# Patient Record
Sex: Female | Born: 1950 | Race: White | Hispanic: No | Marital: Married | Smoking: Former smoker
Health system: Southern US, Community
[De-identification: ages and names within clinical notes are randomized; demographics above are authoritative.]

## PROBLEM LIST (undated history)

## (undated) DIAGNOSIS — M549 Dorsalgia, unspecified: Secondary | ICD-10-CM

## (undated) DIAGNOSIS — M23351 Other meniscus derangements, posterior horn of lateral meniscus, right knee: Secondary | ICD-10-CM

## (undated) DIAGNOSIS — F32A Depression, unspecified: Secondary | ICD-10-CM

## (undated) DIAGNOSIS — J449 Chronic obstructive pulmonary disease, unspecified: Secondary | ICD-10-CM

## (undated) DIAGNOSIS — E119 Type 2 diabetes mellitus without complications: Secondary | ICD-10-CM

## (undated) DIAGNOSIS — J189 Pneumonia, unspecified organism: Secondary | ICD-10-CM

## (undated) DIAGNOSIS — IMO0002 Reserved for concepts with insufficient information to code with codable children: Secondary | ICD-10-CM

## (undated) DIAGNOSIS — Z972 Presence of dental prosthetic device (complete) (partial): Secondary | ICD-10-CM

## (undated) DIAGNOSIS — R569 Unspecified convulsions: Secondary | ICD-10-CM

## (undated) DIAGNOSIS — R112 Nausea with vomiting, unspecified: Secondary | ICD-10-CM

## (undated) DIAGNOSIS — IMO0001 Reserved for inherently not codable concepts without codable children: Secondary | ICD-10-CM

## (undated) DIAGNOSIS — H9190 Unspecified hearing loss, unspecified ear: Secondary | ICD-10-CM

## (undated) DIAGNOSIS — Z9889 Other specified postprocedural states: Secondary | ICD-10-CM

## (undated) DIAGNOSIS — C801 Malignant (primary) neoplasm, unspecified: Secondary | ICD-10-CM

## (undated) DIAGNOSIS — G4730 Sleep apnea, unspecified: Secondary | ICD-10-CM

## (undated) DIAGNOSIS — K222 Esophageal obstruction: Secondary | ICD-10-CM

## (undated) DIAGNOSIS — F329 Major depressive disorder, single episode, unspecified: Secondary | ICD-10-CM

## (undated) DIAGNOSIS — D649 Anemia, unspecified: Secondary | ICD-10-CM

## (undated) DIAGNOSIS — M797 Fibromyalgia: Secondary | ICD-10-CM

## (undated) DIAGNOSIS — K859 Acute pancreatitis without necrosis or infection, unspecified: Secondary | ICD-10-CM

## (undated) DIAGNOSIS — K219 Gastro-esophageal reflux disease without esophagitis: Secondary | ICD-10-CM

## (undated) DIAGNOSIS — M1990 Unspecified osteoarthritis, unspecified site: Secondary | ICD-10-CM

## (undated) DIAGNOSIS — I209 Angina pectoris, unspecified: Secondary | ICD-10-CM

## (undated) DIAGNOSIS — K449 Diaphragmatic hernia without obstruction or gangrene: Secondary | ICD-10-CM

## (undated) DIAGNOSIS — R519 Headache, unspecified: Secondary | ICD-10-CM

## (undated) DIAGNOSIS — J45909 Unspecified asthma, uncomplicated: Secondary | ICD-10-CM

## (undated) DIAGNOSIS — M1711 Unilateral primary osteoarthritis, right knee: Secondary | ICD-10-CM

## (undated) DIAGNOSIS — G8929 Other chronic pain: Secondary | ICD-10-CM

## (undated) DIAGNOSIS — R51 Headache: Secondary | ICD-10-CM

## (undated) DIAGNOSIS — I4891 Unspecified atrial fibrillation: Secondary | ICD-10-CM

## (undated) DIAGNOSIS — K5792 Diverticulitis of intestine, part unspecified, without perforation or abscess without bleeding: Secondary | ICD-10-CM

## (undated) DIAGNOSIS — I8290 Acute embolism and thrombosis of unspecified vein: Secondary | ICD-10-CM

## (undated) DIAGNOSIS — I493 Ventricular premature depolarization: Secondary | ICD-10-CM

## (undated) HISTORY — PX: HERNIA REPAIR: SHX51

## (undated) HISTORY — DX: Gastro-esophageal reflux disease without esophagitis: K21.9

## (undated) HISTORY — DX: Diverticulitis of intestine, part unspecified, without perforation or abscess without bleeding: K57.92

## (undated) HISTORY — PX: DILATION AND CURETTAGE OF UTERUS: SHX78

## (undated) HISTORY — DX: Dorsalgia, unspecified: M54.9

## (undated) HISTORY — DX: Reserved for concepts with insufficient information to code with codable children: IMO0002

## (undated) HISTORY — PX: CHOLECYSTECTOMY: SHX55

## (undated) HISTORY — DX: Acute pancreatitis without necrosis or infection, unspecified: K85.90

## (undated) HISTORY — DX: Other chronic pain: G89.29

## (undated) HISTORY — PX: OTHER SURGICAL HISTORY: SHX169

## (undated) HISTORY — DX: Unspecified atrial fibrillation: I48.91

## (undated) HISTORY — DX: Depression, unspecified: F32.A

## (undated) HISTORY — DX: Sleep apnea, unspecified: G47.30

## (undated) HISTORY — DX: Chronic obstructive pulmonary disease, unspecified: J44.9

## (undated) HISTORY — DX: Esophageal obstruction: K22.2

## (undated) HISTORY — DX: Unspecified osteoarthritis, unspecified site: M19.90

## (undated) HISTORY — DX: Diaphragmatic hernia without obstruction or gangrene: K44.9

## (undated) HISTORY — PX: TONSILLECTOMY: SUR1361

## (undated) HISTORY — PX: CERVICAL CONE BIOPSY: SUR198

## (undated) HISTORY — PX: JOINT REPLACEMENT: SHX530

## (undated) HISTORY — PX: FOOT ARTHRODESIS: SHX1655

## (undated) HISTORY — PX: SHOULDER OPEN ROTATOR CUFF REPAIR: SHX2407

## (undated) HISTORY — DX: Major depressive disorder, single episode, unspecified: F32.9

---

## 2005-05-14 ENCOUNTER — Ambulatory Visit: Payer: Self-pay | Admitting: Internal Medicine

## 2005-06-19 ENCOUNTER — Ambulatory Visit: Payer: Self-pay | Admitting: Internal Medicine

## 2005-06-23 ENCOUNTER — Ambulatory Visit (HOSPITAL_COMMUNITY): Admission: RE | Admit: 2005-06-23 | Discharge: 2005-06-23 | Payer: Self-pay | Admitting: Internal Medicine

## 2005-07-02 ENCOUNTER — Ambulatory Visit (HOSPITAL_COMMUNITY): Admission: RE | Admit: 2005-07-02 | Discharge: 2005-07-02 | Payer: Self-pay | Admitting: Internal Medicine

## 2005-07-02 ENCOUNTER — Ambulatory Visit: Payer: Self-pay | Admitting: Internal Medicine

## 2005-11-20 ENCOUNTER — Ambulatory Visit: Payer: Self-pay | Admitting: Internal Medicine

## 2005-12-03 ENCOUNTER — Encounter: Payer: Self-pay | Admitting: Internal Medicine

## 2005-12-03 ENCOUNTER — Ambulatory Visit: Payer: Self-pay | Admitting: Internal Medicine

## 2005-12-03 ENCOUNTER — Ambulatory Visit (HOSPITAL_COMMUNITY): Admission: RE | Admit: 2005-12-03 | Discharge: 2005-12-03 | Payer: Self-pay | Admitting: Internal Medicine

## 2005-12-03 HISTORY — PX: COLONOSCOPY: SHX174

## 2005-12-06 ENCOUNTER — Inpatient Hospital Stay (HOSPITAL_COMMUNITY): Admission: EM | Admit: 2005-12-06 | Discharge: 2005-12-09 | Payer: Self-pay | Admitting: Emergency Medicine

## 2005-12-08 ENCOUNTER — Ambulatory Visit: Payer: Self-pay | Admitting: Cardiology

## 2005-12-13 ENCOUNTER — Inpatient Hospital Stay (HOSPITAL_COMMUNITY): Admission: EM | Admit: 2005-12-13 | Discharge: 2005-12-19 | Payer: Self-pay | Admitting: Emergency Medicine

## 2005-12-15 ENCOUNTER — Ambulatory Visit: Payer: Self-pay | Admitting: Internal Medicine

## 2005-12-31 ENCOUNTER — Ambulatory Visit: Payer: Self-pay | Admitting: Internal Medicine

## 2006-01-02 ENCOUNTER — Ambulatory Visit (HOSPITAL_COMMUNITY): Admission: RE | Admit: 2006-01-02 | Discharge: 2006-01-02 | Payer: Self-pay | Admitting: Internal Medicine

## 2006-01-05 ENCOUNTER — Ambulatory Visit: Payer: Self-pay | Admitting: Cardiology

## 2006-01-07 ENCOUNTER — Ambulatory Visit: Payer: Self-pay | Admitting: Cardiology

## 2006-01-07 ENCOUNTER — Encounter (HOSPITAL_COMMUNITY): Admission: RE | Admit: 2006-01-07 | Discharge: 2006-02-06 | Payer: Self-pay | Admitting: Cardiology

## 2006-01-12 ENCOUNTER — Ambulatory Visit: Payer: Self-pay | Admitting: Cardiology

## 2006-01-29 ENCOUNTER — Ambulatory Visit: Payer: Self-pay | Admitting: Internal Medicine

## 2006-03-19 ENCOUNTER — Ambulatory Visit: Payer: Self-pay | Admitting: Internal Medicine

## 2006-05-04 ENCOUNTER — Ambulatory Visit: Payer: Self-pay | Admitting: Internal Medicine

## 2006-05-18 ENCOUNTER — Ambulatory Visit: Payer: Self-pay | Admitting: Internal Medicine

## 2006-06-25 ENCOUNTER — Ambulatory Visit: Payer: Self-pay | Admitting: Internal Medicine

## 2006-08-10 ENCOUNTER — Ambulatory Visit: Payer: Self-pay | Admitting: Internal Medicine

## 2006-08-21 ENCOUNTER — Ambulatory Visit (HOSPITAL_COMMUNITY): Admission: RE | Admit: 2006-08-21 | Discharge: 2006-08-21 | Payer: Self-pay | Admitting: Internal Medicine

## 2006-09-04 ENCOUNTER — Ambulatory Visit: Payer: Self-pay | Admitting: Internal Medicine

## 2006-09-18 ENCOUNTER — Ambulatory Visit (HOSPITAL_COMMUNITY): Admission: RE | Admit: 2006-09-18 | Discharge: 2006-09-18 | Payer: Self-pay | Admitting: Internal Medicine

## 2006-09-25 ENCOUNTER — Ambulatory Visit: Payer: Self-pay | Admitting: Internal Medicine

## 2006-10-12 ENCOUNTER — Ambulatory Visit: Payer: Self-pay | Admitting: Internal Medicine

## 2006-11-17 ENCOUNTER — Ambulatory Visit: Payer: Self-pay | Admitting: Internal Medicine

## 2006-12-15 ENCOUNTER — Ambulatory Visit: Payer: Self-pay | Admitting: Internal Medicine

## 2007-03-08 ENCOUNTER — Ambulatory Visit: Payer: Self-pay | Admitting: Internal Medicine

## 2007-04-07 ENCOUNTER — Ambulatory Visit: Payer: Self-pay | Admitting: Internal Medicine

## 2007-04-22 ENCOUNTER — Ambulatory Visit: Payer: Self-pay | Admitting: Internal Medicine

## 2007-04-26 ENCOUNTER — Ambulatory Visit (HOSPITAL_COMMUNITY): Admission: RE | Admit: 2007-04-26 | Discharge: 2007-04-26 | Payer: Self-pay | Admitting: General Surgery

## 2007-06-28 ENCOUNTER — Ambulatory Visit: Payer: Self-pay | Admitting: Internal Medicine

## 2007-07-01 ENCOUNTER — Ambulatory Visit (HOSPITAL_COMMUNITY): Admission: RE | Admit: 2007-07-01 | Discharge: 2007-07-01 | Payer: Self-pay | Admitting: Gastroenterology

## 2007-07-01 ENCOUNTER — Ambulatory Visit: Payer: Self-pay | Admitting: Gastroenterology

## 2007-07-12 ENCOUNTER — Ambulatory Visit (HOSPITAL_COMMUNITY): Admission: RE | Admit: 2007-07-12 | Discharge: 2007-07-12 | Payer: Self-pay | Admitting: Internal Medicine

## 2007-07-12 ENCOUNTER — Ambulatory Visit: Payer: Self-pay | Admitting: Internal Medicine

## 2007-07-12 HISTORY — PX: ESOPHAGOGASTRODUODENOSCOPY: SHX1529

## 2007-09-01 ENCOUNTER — Ambulatory Visit: Payer: Self-pay | Admitting: Internal Medicine

## 2007-12-08 ENCOUNTER — Ambulatory Visit: Payer: Self-pay | Admitting: Internal Medicine

## 2008-05-05 ENCOUNTER — Ambulatory Visit: Payer: Self-pay | Admitting: Internal Medicine

## 2008-05-12 ENCOUNTER — Encounter (HOSPITAL_COMMUNITY): Admission: RE | Admit: 2008-05-12 | Discharge: 2008-06-11 | Payer: Self-pay | Admitting: Internal Medicine

## 2008-11-24 ENCOUNTER — Emergency Department (HOSPITAL_COMMUNITY): Admission: EM | Admit: 2008-11-24 | Discharge: 2008-11-25 | Payer: Self-pay | Admitting: Emergency Medicine

## 2008-11-25 ENCOUNTER — Ambulatory Visit: Payer: Self-pay | Admitting: Internal Medicine

## 2008-11-30 ENCOUNTER — Ambulatory Visit (HOSPITAL_COMMUNITY): Admission: RE | Admit: 2008-11-30 | Discharge: 2008-11-30 | Payer: Self-pay | Admitting: Internal Medicine

## 2008-11-30 ENCOUNTER — Ambulatory Visit: Payer: Self-pay | Admitting: Internal Medicine

## 2008-11-30 HISTORY — PX: ESOPHAGOGASTRODUODENOSCOPY: SHX1529

## 2008-12-25 ENCOUNTER — Encounter: Payer: Self-pay | Admitting: Gastroenterology

## 2009-06-13 ENCOUNTER — Ambulatory Visit: Payer: Self-pay | Admitting: Internal Medicine

## 2009-06-14 DIAGNOSIS — K219 Gastro-esophageal reflux disease without esophagitis: Secondary | ICD-10-CM | POA: Insufficient documentation

## 2009-06-14 DIAGNOSIS — K5190 Ulcerative colitis, unspecified, without complications: Secondary | ICD-10-CM | POA: Insufficient documentation

## 2009-08-01 ENCOUNTER — Ambulatory Visit: Payer: Self-pay | Admitting: Internal Medicine

## 2009-08-01 DIAGNOSIS — K5900 Constipation, unspecified: Secondary | ICD-10-CM | POA: Insufficient documentation

## 2009-08-01 DIAGNOSIS — R1032 Left lower quadrant pain: Secondary | ICD-10-CM | POA: Insufficient documentation

## 2009-10-02 ENCOUNTER — Ambulatory Visit: Payer: Self-pay | Admitting: Internal Medicine

## 2009-10-02 ENCOUNTER — Encounter (INDEPENDENT_AMBULATORY_CARE_PROVIDER_SITE_OTHER): Payer: Self-pay

## 2009-10-03 LAB — CONVERTED CEMR LAB
Basophils Absolute: 0.1 10*3/uL (ref 0.0–0.1)
Glucose, Bld: 100 mg/dL — ABNORMAL HIGH (ref 70–99)
Hemoglobin: 13.2 g/dL (ref 12.0–15.0)
Lymphocytes Relative: 35 % (ref 12–46)
Monocytes Absolute: 0.7 10*3/uL (ref 0.1–1.0)
Neutro Abs: 4.8 10*3/uL (ref 1.7–7.7)
Potassium: 4.5 meq/L (ref 3.5–5.3)
RDW: 14.5 % (ref 11.5–15.5)
Sodium: 138 meq/L (ref 135–145)

## 2010-04-26 ENCOUNTER — Encounter (INDEPENDENT_AMBULATORY_CARE_PROVIDER_SITE_OTHER): Payer: Self-pay

## 2010-06-13 ENCOUNTER — Ambulatory Visit: Payer: Self-pay | Admitting: Internal Medicine

## 2010-07-01 ENCOUNTER — Encounter: Payer: Self-pay | Admitting: Urgent Care

## 2010-10-29 NOTE — Letter (Signed)
Summary: Out of Work Note  [facility]  [address]   [place], [place] [address]   Phone: [phone]  Fax: [phone]    10/02/2009  TO: WHOM IT MAY CONCERN  RE: [name] [place] [id number] Jul 08, 1951       The above named individual was seen in our office today.     If you have any further questions or need additional information, please call.     Sincerely,     [facility] R. Garfield Cornea, M.D.    Caro Hight, M.D. Vickey Huger, FNP-BC    Neil Crouch, PA-C Phone: [phone]    Fax: [phone]

## 2010-10-29 NOTE — Letter (Signed)
Summary: Recall Office Visit  [facility]  [address]   [place], [place] [address]   Phone: [phone]  Fax: [phone]      April 26, 2010   [name] [address] [place]  [address] 1951-06-14   Dear Ms. Krus,   According to our records, it is time for you to schedule a follow-up office visit with [place].   At your convenience, please call [phone] to schedule an office visit. If you have any questions, concerns, or feel that this letter is in error, we would appreciate your call.   Sincerely,    Burnadette Peter LPN  [facility] Ph: [phone]   Fax: [phone]

## 2010-10-29 NOTE — Assessment & Plan Note (Signed)
Summary: 6wk follow up- cdg   Visit Type:  Follow-up Visit Primary Care Provider:  [name], PA  Chief Complaint:  F/u UC/Gerd.  History of Present Illness: 60 year old ulcerative colitis. Symptoms well controlled now on colazoll. She has had no  diarrhea,  rectal bleeding; She has some constipation.  She takes a dose of GlycoLax once weekly on average.   She may go 4 days before having a bowel movement; waits a couple days prior taking anything for constipation. She hasn't had any recent blood work. Reflux symptoms are well-controlled. She has'nt had  any further dysphagia since she had her esophagus dilated.  Current Medications (verified): 1)  Wellbutrin Xl 150 Mg Xr24h-Tab (Bupropion Hcl) .... Take 1 Tablet By Mouth Once A Day 2)  Zyrtec Allergy 10 Mg Tabs (Cetirizine Hcl) .... Take 1 Tablet By Mouth Once A Day 3)  Prevacid 30 Mg Cpdr (Lansoprazole) .... Take 1 Tablet By Mouth Once A Day 4)  Trazodone Hcl 50 Mg Tabs (Trazodone Hcl) .... Take 2 Tablets At Bedtime 5)  Asa 81 Mg .... Take 1 Tablet By Mouth Once A Day 6)  Metformin Hcl 500 Mg Tabs (Metformin Hcl) .... Take 2 Tablets Pm 7)  Ferrous Gluconate 225 (27 Fe) Mg Tabs (Ferrous Gluconate) .... Take 1 Tablet By Mouth Once A Day 8)  Balsalazide Disodium 750 Mg Caps (Balsalazide Disodium) .... 3 By Mouth Tid 9)  Astelin 137 Mcg/spray Soln (Azelastine Hcl) .... 2 Sprays Each Nostril Prn 10)  Citrucel 500 Mg Tabs (Methylcellulose (Laxative)) .... Take 1 Tablet By Mouth Two Times A Day 11)  Calcium Plus D .... Take 1 Tablet By Mouth Two Times A Day 12)  Tramadol Hcl 50 Mg Tabs (Tramadol Hcl) .... One Tablet Three Times A Day As Needed 13)  Albuterol Inhaler .... As Needed 14)  Advair Diskus .... Twice Daily 15)  Cymbalta 60 Mg Cpep (Duloxetine Hcl) .... Take 1 Tablet By Mouth Once A Day 16)  Zaditor .... As Directed For Eyes 17)  Glycolax  Powd (Polyethylene Glycol 3350) .Marland KitchenMarland KitchenMarland Kitchen 17 Grams By Mouth As Needed For Constipation 18)  Septra  .... Take 1 Tablet By Mouth Two Times A Day  Allergies (verified): 1)  ! Penicillin 2)  ! Ceftin 3)  ! Codeine 4)  ! Asa 5)  ! Biaxin 6)  ! * Asacol 7)  ! Entex 8)  ! * Cefzil 9)  ! * Albumin/eggs  Past History:  Past Medical History: Last updated: 06/21/2009 Seasonal Allergies Depression Ulcerative Colitis Reflux Schatzki's Ring Asacol Induced pancreatitis COPD Diverticulitis Hiatal Hernia Vertigo Mild Sleep Apnea  Past Surgical History: Last updated: June 21, 2009 Tonsilectomy D&C Hernia Cholecystectomy Carcinoma in situ of cervix (D&C colinazation  1980) Rebuild arch and foot  2007  Family History: Last updated: June 21, 2009 Father: Deceased age 22  Complications from surgery for bladder Mother: Deceased age 55   Pneumonia ? complications Siblings:2 brothers and 2 sisters   all alive and healthy  Social History: Last updated: 2009/06/21 Marital Status: Married Children: three Occupation: Astronomer Aide Former Smoker  Quit x 7 years No alcohol for many years No exercise routine, walks alot at school Patient is a former smoker.  Alcohol Use - no  Risk Factors: Smoking Status: quit (06/21/2009)  Vital Signs:  Patient profile:   60 year old female Height:      64 inches Weight:      176 pounds BMI:     30.32 Temp:     98.9 degrees  F oral Pulse rate:   96 / minute BP sitting:   140 / 78  (left arm) Cuff size:   regular  Vitals Entered By: Waldon Merl LPN (October 03, 4763 3:48 PM)  Physical Exam  General:  pleasant alert, conversant and in no acute distress Eyes:  no scleral icterus are pink Lungs:  clear to auscultation Heart:  regular rate and rhythm without murmur gallop rub Abdomen:  nondistended positive bowel sounds soft, nontender without appreciable mass or organomegaly  Impression & Recommendations: Pleasant 60 year old lady with ulcerative colitis in remission. She's had some problems with constipation more recently. Reflux symptoms  well-controlled. No further dysphagia since she had her esophagus dilated.  Needs repeat blood work.  Recommendations: BMET and  CBC today.  Continue colaszoll. Use Gycolax 17 g orally at bedtime if no bowel movement on any given day. This should facilitate bowel function. Unless something comes up, plan to see this nice lady back in 6 months.  Other Orders: T-Basic Metabolic Panel ([id number]) T-CBC w/Diff [phone])       Appended Document: Orders Update-charge    Clinical Lists Changes  Orders: Added new Service order of Est. Patient Level III (74944) - Signed

## 2010-10-29 NOTE — Assessment & Plan Note (Signed)
Summary: one yr fu/gerd,ulcerative colitis,constipation/ss   Visit Type:  Follow-up Visit Primary Care Dashley Monts:  [name], PA  Chief Complaint:  F/U gerd/uc/colitis/constipation.  History of Present Illness: History of ulcerative colitis. Taking Colazal 3 tablets b.i.d.; had her left ankle now operated on. Her right ankle is doing well following surgery. She is has been somewhat constipated -  takes lactulose p.r.n. No tenesmus, abdominal pain or rectal bleeding. GERD symptoms well-controlled.    Current Medications (verified): 1)  Wellbutrin Xl 150 Mg Xr24h-Tab (Bupropion Hcl) .... Take 1 Tablet By Mouth Once A Day 2)  Zyrtec Allergy 10 Mg Tabs (Cetirizine Hcl) .... Take 1 Tablet By Mouth Once A Day 3)  Prevacid 30 Mg Cpdr (Lansoprazole) .... Take 1 Tablet By Mouth Once A Day 4)  Trazodone Hcl 50 Mg Tabs (Trazodone Hcl) .... Take 2 Tablets At Bedtime 5)  Asa 81 Mg .... Take 1 Tablet By Mouth Once A Day 6)  Metformin Hcl 500 Mg Tabs (Metformin Hcl) .... Take 2 Tablets Pm 7)  Ferrous Gluconate 225 (27 Fe) Mg Tabs (Ferrous Gluconate) .... Take 1 Tablet By Mouth Once A Day 8)  Balsalazide Disodium 750 Mg Caps (Balsalazide Disodium) .... 3 By Mouth Tid 9)  Astelin 137 Mcg/spray Soln (Azelastine Hcl) .... 2 Sprays Each Nostril Prn 10)  Citrucel 500 Mg Tabs (Methylcellulose (Laxative)) .... Take 1 Tablet By Mouth Two Times A Day 11)  Calcium Plus D .... Take 1 Tablet By Mouth Two Times A Day 12)  Tramadol Hcl 50 Mg Tabs (Tramadol Hcl) .... One Tablet Three Times A Day As Needed 13)  Albuterol Inhaler .... As Needed 14)  Advair Diskus .... Twice Daily 15)  Cymbalta 60 Mg Cpep (Duloxetine Hcl) .... Take 1 Tablet By Mouth Once A Day 16)  Zaditor .... As Directed For Eyes 17)  Glycolax  Powd (Polyethylene Glycol 3350) .Marland KitchenMarland KitchenMarland Kitchen 17 Grams By Mouth As Needed For Constipation 18)  Endocet 10-325 Mg Tabs (Oxycodone-Acetaminophen) .... As Needed ( Had Foot Surgery)  Allergies (verified): 1)  !  Penicillin 2)  ! Ceftin 3)  ! Codeine 4)  ! Asa 5)  ! Biaxin 6)  ! * Asacol 7)  ! Entex 8)  ! * Cefzil 9)  ! * Albumin/eggs  Past History:  Past Medical History: Last updated: Jul 02, 2009 Seasonal Allergies Depression Ulcerative Colitis Reflux Schatzki's Ring Asacol Induced pancreatitis COPD Diverticulitis Hiatal Hernia Vertigo Mild Sleep Apnea  Family History: Last updated: 07-02-2009 Father: Deceased age 25  Complications from surgery for bladder Mother: Deceased age 60   Pneumonia ? complications Siblings:2 brothers and 2 sisters   all alive and healthy  Social History: Last updated: 07/02/09 Marital Status: Married Children: three Occupation: Astronomer Aide Former Smoker  Quit x 7 years No alcohol for many years No exercise routine, walks alot at school Patient is a former smoker.  Alcohol Use - no  Risk Factors: Smoking Status: quit (07/02/2009)  Past Surgical History: Tonsilectomy D&C Hernia Cholecystectomy Carcinoma in situ of cervix (D&C colinazation  1980) Rebuild arch and foot  2007 Left foot surgery 02/2010  Physical Exam  Abdomen:  confined to a wheelchair. Appears comfortable abdomen is soft and nontender without obvious mass or organomegaly  Impression & Recommendations: Impression: History of ulcerative  colitis - remains in remission on colazall-low dose. GERD symptoms well-controlled. She remains in remission.  Constipation well-controlled on lactulose p.r.n.  Recommendations continue current regimen plan to see her back in one year. She states in 3-6 months  she should have lab work including electrolytes done by PCP office. I've asked her to get copies forwarded to me.  Appended Document: Orders Update    Clinical Lists Changes  Orders: Added new Service order of Est. Patient Level III (81275) - Signed

## 2010-10-29 NOTE — Medication Information (Signed)
Summary: Visual merchandiser   Imported By: Zeb Comfort 07/01/2010 08:20:39  _____________________________________________________________________  External Attachment:    Type:   Image     Comment:   External Document  Appended Document: RX Folder    Prescriptions: BALSALAZIDE DISODIUM 750 MG CAPS (BALSALAZIDE DISODIUM) 3 by mouth tid  #270 x 5   Entered and Authorized by:   Andria Meuse FNP-BC   Signed by:   Andria Meuse FNP-BC on 07/01/2010   Method used:   Electronically to        [id number] (retail)       [address]       [place], [place]  [address]       Ph: [phone] or [phone]       Fax: [phone]   RxID:   [id number]

## 2011-01-09 ENCOUNTER — Ambulatory Visit (INDEPENDENT_AMBULATORY_CARE_PROVIDER_SITE_OTHER): Payer: BC Managed Care – PPO | Admitting: Gastroenterology

## 2011-01-09 ENCOUNTER — Encounter: Payer: Self-pay | Admitting: Gastroenterology

## 2011-01-09 DIAGNOSIS — K5190 Ulcerative colitis, unspecified, without complications: Secondary | ICD-10-CM | POA: Insufficient documentation

## 2011-01-09 DIAGNOSIS — R1310 Dysphagia, unspecified: Secondary | ICD-10-CM | POA: Insufficient documentation

## 2011-01-09 LAB — GLUCOSE, CAPILLARY: Glucose-Capillary: 101 mg/dL — ABNORMAL HIGH (ref 70–99)

## 2011-01-09 NOTE — Assessment & Plan Note (Signed)
UC in remission, doing well on colazal. No changes required at this time.

## 2011-01-09 NOTE — Assessment & Plan Note (Signed)
60 year old pleasant Caucasian female with a hx of dysphagia secondary to Schatzki's ring, s/p multiple endoscopic dilations in past. Most recently in 2010. Pt with 1 week hx of worsening dysphagia, with likely culprit of Schatzki's ring. No reflux. No odynophagia. Will proceed with EGD/ED.   EGD with ED to be performed in near future with Dr. Gala Romney: the R/B/A have been discussed in detail with understanding and agreement by patient.   Continue PPI

## 2011-01-09 NOTE — Patient Instructions (Signed)
Follow soft diet, chew well.  We have set you up for an endoscopy with Dr. Gala Romney.  Continue Prevacid.

## 2011-01-09 NOTE — Progress Notes (Signed)
Referring Provider: None Primary Care Physician:  Gar Ponto, MD Gastroenterologist: Dr. Gala Romney  CC: Dysphagia   HPI:   [name] is a 60 year old pleasant female with a hx of UC, symptoms well-controlled on colazal. However, she also has a hx of multiple dilations in the past secondary to Schatzki's ring; in fact, she had a food impaction in the past we well, requiring endoscopic intervention. Her last EGD was in 2010. She currently denies reflux. She has had dysphagia X 1 week with food and pills. Denies odynophagia. Occasional epigastric discomfort with dysphagia. No loss of appetite; no wt loss. No hematemesis. As for hx of UC, she is at her baseline having 2-3 BMs per day. No abdominal pain or rectal bleeding.    Past Medical History  Diagnosis Date  . Depression   . Ulcerative colitis   . GERD (gastroesophageal reflux disease)   . Pancreatitis   . COPD (chronic obstructive pulmonary disease)   . Diverticulitis   . Sleep apnea   . Schatzki's ring   . S/P endoscopy     multiple times requiring dilation. Most recent in 2010    Past Surgical History  Procedure Date  . Tonsillectomy   . Dilation and curettage of uterus   . Hernia repair   . Cholecystectomy   . Cervical cone biopsy   . Arch and foot     Current Outpatient Prescriptions  Medication Sig Dispense Refill  . albuterol (PROVENTIL,VENTOLIN) 90 MCG/ACT inhaler Inhale 2 puffs into the lungs every 4 (four) hours as needed.        Marland Kitchen aspirin 81 MG tablet Take 81 mg by mouth daily.        Marland Kitchen azelastine (ASTELIN) 137 MCG/SPRAY nasal spray 2 sprays by Nasal route 2 (two) times daily. Use in each nostril as directed       . balsalazide (COLAZAL) 750 MG capsule Take 2,250 mg by mouth 3 (three) times daily.        Marland Kitchen buPROPion (WELLBUTRIN XL) 300 MG 24 hr tablet Take 300 mg by mouth daily.        . Calcium Carbonate-Vitamin D (CALCIUM + D PO) Take 1 tablet by mouth 2 (two) times daily.        . cetirizine (ZYRTEC) 10  MG chewable tablet Chew 10 mg by mouth daily.        . DULoxetine (CYMBALTA) 60 MG capsule Take 60 mg by mouth daily.        . ferrous gluconate (FERGON) 225 (27 FE) MG tablet Take 240 mg by mouth 3 (three) times daily with meals.        . Fluticasone-Salmeterol (ADVAIR DISKUS) 100-50 MCG/DOSE AEPB Inhale 1 puff into the lungs every 12 (twelve) hours.        Marland Kitchen ketotifen (ZADITOR) 0.025 % ophthalmic solution Place 2 drops into both eyes 2 (two) times daily.        . lansoprazole (PREVACID) 30 MG capsule Take 30 mg by mouth daily.        . metFORMIN (GLUMETZA) 500 MG (MOD) 24 hr tablet Take 1,000 mg by mouth daily with breakfast.        . methylcellulose packet Take 1 each by mouth 2 (two) times daily.        . traMADol (ULTRAM-ER) 100 MG 24 hr tablet Take 100 mg by mouth 3 (three) times daily.        . traZODone (DESYREL) 100 MG tablet Take 100 mg by mouth at bedtime.  Allergies as of 01/09/2011 - Review Complete 01/09/2011  Allergen Reaction Noted  . Aspirin    . Cefprozil    . Cefuroxime axetil  08/08/2008  . Clarithromycin  08/08/2008  . Codeine    . Entex  08/08/2008  . Mesalamine    . Penicillins  08/08/2008    Family History  Problem Relation Age of Onset  . Colon cancer Neg Hx     History   Social History  . Marital Status: Married    Spouse Name: N/A    Number of Children: 3  . Years of Education: N/A   Occupational History  . teacher's aide    Social History Main Topics  . Smoking status: Former Smoker -- 1.0 packs/day for 30 years    Types: Cigarettes    Quit date: 09/29/2000  . Smokeless tobacco: Never Used  . Alcohol Use: No  . Drug Use: No  . Sexually Active: Yes -- Female partner(s)    Birth Control/ Protection: None     spouse   Other Topics Concern  . None   Social History Narrative  . None    Review of Systems: Gen: Denies fever, chills, anorexia. Denies fatigue, weakness, weight loss.  CV: Denies chest pain, palpitations, syncope,  peripheral edema, and claudication. Resp: Denies dyspnea at rest, cough, wheezing, coughing up blood, and pleurisy. GI: See HPI Derm: Denies rash, itching, dry skin Psych: Denies depression, anxiety, memory loss, confusion. No homicidal or suicidal ideation.  Heme: Denies bruising, bleeding, and enlarged lymph nodes.  Physical Exam: BP 127/81  Pulse 90  Temp 99 F (37.2 C)  Ht 5' 4"  (1.626 m)  Wt 172 lb 3.2 oz (78.109 kg)  BMI 29.56 kg/m2  SpO2 98% General:   Alert and oriented. No distress noted. Pleasant and cooperative.  Head:  Normocephalic and atraumatic. Eyes:  Conjuctiva clear without scleral icterus. Mouth:  Oral mucosa pink and moist. Good dentition. No lesions. Neck:  Supple, without mass or thyromegaly. Heart:  S1, S2 present without murmurs, rubs, or gallops. Regular rate and rhythm. Abdomen:  +BS, soft, non-tender and non-distended. No rebound or guarding. No HSM or masses noted. Msk:  Symmetrical without gross deformities. Normal posture. Extremities:  Without edema. Neurologic:  Alert and  oriented x4;  grossly normal neurologically. Skin:  Intact without significant lesions or rashes. Cervical Nodes:  No significant cervical adenopathy. Psych:  Alert and cooperative. Normal mood and affect.

## 2011-01-14 LAB — CBC
HCT: 38.1 % (ref 36.0–46.0)
Hemoglobin: 12.8 g/dL (ref 12.0–15.0)
MCHC: 33.6 g/dL (ref 30.0–36.0)
MCV: 96.4 fL (ref 78.0–100.0)
Platelets: 247 10*3/uL (ref 150–400)
RDW: 14.4 % (ref 11.5–15.5)

## 2011-01-14 LAB — GLUCOSE, CAPILLARY: Glucose-Capillary: 58 mg/dL — ABNORMAL LOW (ref 70–99)

## 2011-01-14 LAB — BASIC METABOLIC PANEL
GFR calc Af Amer: >60 mL/min (ref >60)
GFR calc non Af Amer: >60 mL/min (ref >60)
Potassium: 4.2 mEq/L (ref 3.5–5.1)
Sodium: 139 mEq/L (ref 135–145)

## 2011-01-14 LAB — DIFFERENTIAL
Basophils Absolute: 0 10*3/uL (ref 0.0–0.1)
Eosinophils Absolute: 0.3 10*3/uL (ref 0.0–0.7)
Eosinophils Relative: 2 % (ref 0–5)
Monocytes Absolute: 0.7 10*3/uL (ref 0.1–1.0)

## 2011-01-28 ENCOUNTER — Encounter: Payer: BC Managed Care – PPO | Admitting: Internal Medicine

## 2011-01-28 ENCOUNTER — Encounter: Payer: 59 | Admitting: Internal Medicine

## 2011-01-28 ENCOUNTER — Ambulatory Visit (HOSPITAL_COMMUNITY)
Admission: RE | Admit: 2011-01-28 | Discharge: 2011-01-28 | Disposition: A | Discharge status: Home / Self Care | Payer: BC Managed Care – PPO | Source: Ambulatory Visit | Attending: Internal Medicine | Admitting: Internal Medicine

## 2011-01-28 DIAGNOSIS — K222 Esophageal obstruction: Secondary | ICD-10-CM

## 2011-01-28 DIAGNOSIS — Z7982 Long term (current) use of aspirin: Secondary | ICD-10-CM | POA: Insufficient documentation

## 2011-01-28 DIAGNOSIS — K449 Diaphragmatic hernia without obstruction or gangrene: Secondary | ICD-10-CM | POA: Insufficient documentation

## 2011-01-28 DIAGNOSIS — J4489 Other specified chronic obstructive pulmonary disease: Secondary | ICD-10-CM | POA: Insufficient documentation

## 2011-01-28 DIAGNOSIS — E785 Hyperlipidemia, unspecified: Secondary | ICD-10-CM | POA: Insufficient documentation

## 2011-01-28 DIAGNOSIS — J449 Chronic obstructive pulmonary disease, unspecified: Secondary | ICD-10-CM | POA: Insufficient documentation

## 2011-01-28 DIAGNOSIS — R1310 Dysphagia, unspecified: Secondary | ICD-10-CM

## 2011-01-28 HISTORY — PX: ESOPHAGOGASTRODUODENOSCOPY: SHX1529

## 2011-01-30 NOTE — Op Note (Signed)
  NAME:  [name], [name]             ACCOUNT NO.:  [id number]  MEDICAL RECORD NO.:  [id number]           PATIENT TYPE:  O  LOCATION:  DAYP                          FACILITY:  [facility]  PHYSICIAN:  R. Garfield Cornea, M.D. DATE OF BIRTH:  Jun 23, 1951  DATE OF PROCEDURE:  01/28/2011 DATE OF DISCHARGE:                              OPERATIVE REPORT   INDICATIONS FOR PROCEDURE:  A 60 year old lady with history of recurrent dysphagia secondary to Schatzki ring now with recurrent reflux symptoms well-controlled on Prevacid 30 mg orally daily.  EGD with potential for esophageal dilation now being done.  Risks, benefits, limitations, alternatives and imponderables have been discussed, questions answered. Please see the documentation in the medical record.  PROCEDURE NOTE:  O2 saturation, blood pressure, pulse and respirations were monitored throughout the entire procedure.  CONSCIOUS SEDATION: 1. Versed 6 mg IV. 2. Demerol 50 mg IV in divided doses. 3. Cetacaine spray for topical pharyngeal anesthesia.  FINDINGS:  Examination of the tubular esophagus revealed a Schatzki ring.  Esophageal mucosa otherwise appeared normal.  EGD junction was easily traversed.  Stomach:  Gastric cavity was emptied and insufflated well with air.  Thorough examination of the gastric mucosa including retroflexed view of the proximal stomach esophagogastric junction demonstrated only a moderate size hiatal hernia, previously noted Schatzki ring.  Pylorus was patent, easily traversed.  Examination of the bulb and second portion revealed no abnormalities.  THERAPEUTIC/DIAGNOSTIC MANEUVERS PERFORMED:  Scope was withdrawn.  A 56- French Maloney dilators passed to full insertion with mild resistance. A look back revealed the ring had been disrupted nicely with minimal bleeding without apparent complication.  The patient tolerated the procedure well and was reactive to endoscopy.  IMPRESSION: 1. Schatzki ring, otherwise  normal esophagus, status post dilation and     disruption as described above. 2. Hiatal hernia otherwise normal stomach D1 and D2.  RECOMMENDATIONS: 1. Continue Prevacid 30 mg orally daily. 2. Followup appointment with [place] in 1 year.     Bridgette Habermann, M.D.     RMR/MEDQ  D:  01/28/2011  T:  01/28/2011  Job:  [id number]  Electronically Signed by Jannette Spanner M.D. on 01/30/2011 03:48:49 PM

## 2011-02-11 NOTE — Op Note (Signed)
[name], [name]             ACCOUNT NO.:  [id number]   MEDICAL RECORD NO.:  [id number]          PATIENT TYPE:  AMB   LOCATION:  DAY                           FACILITY:  [facility]   PHYSICIAN:  Jamesetta So, M.D.  DATE OF BIRTH:  [birth year]   DATE OF PROCEDURE:  04/26/2007  DATE OF DISCHARGE:                               OPERATIVE REPORT   PREOPERATIVE DIAGNOSIS:  Incisional hernia.   POSTOPERATIVE DIAGNOSIS:  Incisional hernia.   PROCEDURE:  Incisional herniorrhaphy with mesh.   SURGEON:  Jamesetta So, MD   ANESTHESIA:  General.   INDICATIONS:  The patient is a 60 year old white female status post a  laparoscopic cholecystectomy in the past at another facility, who  presents with a supraumbilical incisional hernia.  It is enlarging in  size and is causing her discomfort.  The risks and benefits of the  procedure including bleeding, infection, and recurrence of the hernia  were fully explained to the patient, who gave informed consent.   PROCEDURE NOTE:  The patient was placed in the supine position.  After  general anesthesia was administered, the abdomen was prepped and draped  using the usual sterile technique with Betadine.  Surgical site  confirmation was performed.   A transverse incision was made superior to the umbilicus.  This was  taken down to the hernia sac.  The hernia sac was excised without  difficulty.  The fascial defect was noted to be approximately 2 cm in  its greatest diameter.  The dual mesh patch was placed into the defect  and was secured with the tabs using 2-0 Novofil interrupted sutures.  Additional fascia was closed over this repair using 0 Ethibond  interrupted sutures.  The subcutaneous layer was reapproximated using a  3-0 Vicryl interrupted suture.  Skin was closed using staples.  Sensorcaine 0.5% was instilled in the surrounding wound.  Betadine  ointment and a dry sterile dressing were applied.   All tape and needle counts were  correct at the end of the procedure.  The patient was awakened and transferred to PACU in stable condition.   COMPLICATIONS:  None.   SPECIMEN:  None.   BLOOD LOSS:  Minimal.      Jamesetta So, M.D.  Electronically Signed     MAJ/MEDQ  D:  04/26/2007  T:  04/27/2007  Job:  [id number]   cc:   R. Garfield Cornea, M.D.  [address]  [place]  [place] [address]   Amy Boyd  [facility]

## 2011-02-11 NOTE — Assessment & Plan Note (Signed)
[name], [name]              CHART#:  [id number]   DATE:  04/07/2007                       DOB:  08-05-1951   CHIEF COMPLAINT:  Bloody diarrhea.   SUBJECTIVE:  The patient is a 60 year old female with a history of pan-  ulcerative colitis.  She is on maintenance Colazal 3 tablets t.i.d.  She  tells me she has not felt well for almost a month now.  She has had  daily loose stools.  She noticed bloody diarrhea and was having some  abdominal pain and nausea.  She was seen in the emergency room at  [facility] on 04/04/2007.  She was started on Cipro and  prednisone.  She continues to complain of loose stools.  She was on  antibiotics post foot surgery on 03/12/2007.  She had been off of  prednisone for about 4 months prior to the onset of these symptoms.  She  was called in some Phenergan on 04/05/2007 for her nausea.  This seems  to help some.  She is also taking Bentyl t.i.d.  She is scheduled to see  Dr. Arnoldo Morale next Tuesday for consideration of herniorrhaphy.  Labs from  [facility] show a chloride of 109, CO2 30, glucose 106,  and otherwise normal MET7.  LFTs were normal.  Hemoglobin 11.4,  hematocrit 33.4, white blood cell count of 9.4, and platelet count of  376.  Acute abdominal film from 04/04/2007 shows nonspecific gas  pattern.  Question of thickening of haustral folds in the left upper  quadrant, possibly compatible with a history of UC history.   CURRENT MEDICATIONS:  See the list from 04/07/2007.   ALLERGIES:  Penicillin, Ceftin, codeine, aspirin, Biaxin, and Entex.   OBJECTIVE:  VITAL SIGNS:  Weight 151 pounds, height 64 inches,  temperature 98 degrees.  Blood pressure 120/68, and pulse 84.  GENERAL:  The patient is a 60 year old Caucasian female who is alert,  oriented, and cooperative, in no acute distress.  HEENT:  Pupils are equal, round, and reactive to light and  accommodation, nonicteric, conjunctivae pink.  Oropharynx pink  and moist  without any lesions.  HEART:  Regular rate and rhythm.  Normal S1, S2.  ABDOMEN:  Positive bowel sounds x4.  No bruits auscultated.  Soft,  nontender, nondistended without palpable mass, hepatosplenomegaly.  No  rebound tenderness or guarding.  EXTREMITIES:  Without clubbing or edema bilaterally.  She does have a  Cam boot in place with recent foot surgery.   ASSESSMENT:  This patient is a 60 year old female with a history of pan-  ulcerative colitis.  She has complaints of watery diarrhea with some  blood in her stools, recently evaluated in [facility]  emergency room.  She was started on Cipro and a prednisone taper.  Given  her recent antibiotic use, I would query clostridium difficile or  pseudomembranous colitis.  Prior sed rate from 03/08/2007 shows normal  as well as a metabolic panel and CBC, although she did have 6%  eosinophilia.  I have discussed this case with Dr. Gala Romney.  Our plan of  care is outlined below.   PLAN:  1. Will obtain full set of stool studies.  2. She needs to continue Flora-Q once daily.  3. Will DC Cipro tomorrow.  4. Will begin tapering her prednisone.  I have  given her a      prescription for 10 mg tablets and she is to taper from 40 down to      30 tomorrow for 4 days, down to 20, and then 10 q.4 days.  5. If her stool studies are benign, would consider further evaluation      with colonoscopy by Dr. Gala Romney.       Vickey Huger, N.P.  Electronically Signed     R. Garfield Cornea, M.D.  Electronically Signed    KJ/MEDQ  D:  04/08/2007  T:  04/09/2007  Job:  [id number]   cc:   [name], PA-C

## 2011-02-11 NOTE — Assessment & Plan Note (Signed)
NAME:  [name], [name]              CHART#:  [id number]   DATE:  04/22/2007                       DOB:  08/29/1951   REASON FOR CONSULTATION:  Followup UC.   SUBJECTIVE:  [name] is a 60 year old female who was last seen April 07, 2007. She had bloody diarrhea at that time. She has a history of  panulcerative colitis. She had recently been on antibiotics, which I  suspect caused her to flare. She rapidly tapered the antibiotics and  prednisone she was given in the emergency room. She had ova and  parasites, C. Diff, which were negative but did show moderate white  blood cells. She had a positive lactoferrin and negative stool culture.  Today she presents today stating that she is doing very well. Her weight  has remained stable. She is completely off prednisone at this time. She  feels much better. She is having an average of 2 to 4 bowel movements  a day. She does notice some urgency however this has been her normal.  She takes Bentyl as needed for this. She is having some pain at her  umbilicus. She has recently been diagnosed with an incisional hernia and  is scheduled to have incisional herniorrhaphy on April 26, 2007 with Dr.  Arnoldo Morale. She denies any fever or chills at this time.   CURRENT MEDICATIONS:  See the list from April 22, 2007.   ALLERGIES:  PENICILLIN, CEFTIN, CODEINE, ASPIRIN, BIAXIN, ENTEX, AND  ASACOL.   OBJECTIVE:  Weight 150 pounds, height 54 inches, temperature 98.7, blood  pressure 110/60, pulse 72.  GENERAL: [name] is a well-developed, well-nourished Caucasian  female in no acute distress.  HEENT: Throat clear.  Nonicteric, conjunctivae pink.  Oropharynx pink  and moist without any lesions.  CHEST: Heart regular rate and rhythm. Normal S1, S2 without any murmurs,  clicks, rubs, or gallops.  LUNGS: Clear to auscultation bilaterally.  ABDOMEN: Positive bowel sounds x4.  No bruits auscultated.  Soft.  Non-  tender, non-distended.  No palpable  organosplenomegaly.  There is no  rebound tenderness or guarding.  She does have a palpable incisional  hernia just a few centimeters above the umbilicus which is easily  reducible.   ASSESSMENT:  [name] is a 60 year old female with history of  panulcerative colitis with recent flare, I suspect due to antibiotic  use. She is currently doing very well. She is scheduled to have an  incisional herniorrhaphy by Dr. Arnoldo Morale.   PLAN:  1. Rx given for  Colazal 750 three t.i.d. #270 with 5 refills.  2. Bentyl 1 p.o. q.i.d. p.r.n. diarrhea #60 with 2 refills.  3. She is to follow up with Dr. Arnoldo Morale for surgery.  4. Follow up with Dr. Gala Romney in December of 2008.       Vickey Huger, N.P.  Electronically Signed     R. Garfield Cornea, M.D.  Electronically Signed    KJ/MEDQ  D:  04/23/2007  T:  04/23/2007  Job:  [id number]   cc:   [name], PA-C ?

## 2011-02-11 NOTE — Op Note (Signed)
NAME:  [name], [name]             ACCOUNT NO.:  [id number]   MEDICAL RECORD NO.:  [id number]          PATIENT TYPE:  AMB   LOCATION:  DAY                           FACILITY:  [facility]   PHYSICIAN:  R. Garfield Cornea, M.D. DATE OF BIRTH:  08-Jan-1951   DATE OF PROCEDURE:  11/30/2008  DATE OF DISCHARGE:                               OPERATIVE REPORT   INDICATIONS FOR PROCEDURE:  A 60 year old lady with history of Schatzki  ring presented last week with food impaction for which she underwent  emergency disimpaction by me.  She has stable gastroesophageal reflux  disease on Prevacid 30 mg orally daily.  She also takes oral Fosamax,  which I have asked her to hold until we get things sorted out further.  She comes now for elective EGD with esophageal dilation as appropriate.  Risks, benefits, alternatives, and limitations have been reviewed and  questions were answered.  Please see the documentation in the medical  record.   PROCEDURE NOTE:  O2 saturation, blood pressure, and pulse of the patient  monitored for the entirety of the procedure.   CONSCIOUS SEDATION:  Versed 4 mg IV and Demerol 75 g IV in divided  doses.   INSTRUMENT:  Pentax video chip system.   FINDINGS:  Digital rectal exam revealed no abnormalities.  Endoscopic  findings:   Dictation ended at this point.      Bridgette Habermann, M.D.  Electronically Signed     RMR/MEDQ  D:  11/30/2008  T:  11/30/2008  Job:  [id number]

## 2011-02-11 NOTE — Op Note (Signed)
NAME:  [name], [name]             ACCOUNT NO.:  [id number]   MEDICAL RECORD NO.:  [id number]          PATIENT TYPE:  AMB   LOCATION:  DAY                           FACILITY:  [facility]   PHYSICIAN:  R. Garfield Cornea, M.D. DATE OF BIRTH:  [birth year]   DATE OF PROCEDURE:  11/30/2008  DATE OF DISCHARGE:                               OPERATIVE REPORT   PROCEDURE:  EGD with [name] dilation.   INDICATIONS FOR PROCEDURE:  A 60 year old lady with a history of  Schatzki's ring presented with esophageal food impaction last weekend  for which she underwent EGD with disimpaction by me.  She now comes for  elective EGD with dilation as appropriate.  Risks, benefits,  alternatives and limitations have been reviewed, questions were  answered.  Please see the documentation in the medical record.   PROCEDURE NOTE:  O2 saturation, blood pressure, pulse and respiration  were monitored throughout the entire procedure.  Conscious sedation of  Versed 4 mg IV, Demerol 75 mg IV in divided doses.  Instrument Pentax  video chip system.  Cetacaine spray for topical pharyngeal anesthesia.   FINDINGS:  Examination of tubular esophagus revealed a tight Schatzki's  ring.  There was no evidence of Barrett's esophagus or neoplasm.  There  was really no stricture just a tight ring which was somewhat  traumatically dilated on passage of the scope.   Stomach:  Gas cavity was emptied and insufflated well with air.  Thorough examination of the gastric mucosa including retroflexion of  proximal stomach, esophagogastric junction demonstrated only a hiatal  hernia.  Pylorus patent and easily traversed.  Examination of the bulb  and second portion revealed no abnormalities.  Therapeutic/diagnostic  maneuvers were performed.  The scope was withdrawn.  A 56 French Maloney  dilator was passed to full insertion with ease.  A look back revealed a  nice disruption of the ring with minimal bleeding and without apparent  complication.  The patient tolerated the procedure well and was reactive  after endoscopy.   IMPRESSION:  Critical Schatzki's ring status post dilation as described  above.  Otherwise unremarkable esophagus.  Small hiatal hernia otherwise  normal stomach, D1, D2.   RECOMMENDATIONS:  Continue Prevacid 30 mg orally daily.  Literature on  reflux and hiatal hernia provided to Ms. Seward Meth.  She may or may not  need a dilation in the future.  We will plan to see her back in the  office in 6 months.  It would be safest for Ms. Rutten to refrain from  taking any etidronate therapy from here on out.  I have recommended she  see Dr. Gar Ponto in regards to changing her over to a parenteral  agent for osteoporosis.      Bridgette Habermann, M.D.  Electronically Signed     RMR/MEDQ  D:  11/30/2008  T:  11/30/2008  Job:  [id number]   cc:   Gar Ponto  Fax: [phone]

## 2011-02-11 NOTE — Assessment & Plan Note (Signed)
[name], [name]              CHART#:  [id number]   DATE:  12/08/2007                       DOB:  Jun 21, 1951   FOLLOWUP:  1. Ulcerative colitis.  2. Gastroesophageal reflux disease.  3. History with dysphagia and known Schatzki ring status post      dilation.  4. History of Asacol-induced pancreatitis.   [name] was last seen on September 01, 2007. She has done well. UC  continues to be in remission. She took a course of antibiotics for  sinusitis a couple of weeks ago and did not have any problems. One to  two formed bowel movements daily.  A MET-7 looked good back in December.  She is tolerating Colazal well. Her gastroesophageal reflux disease  symptoms are well controlled on Prevacid 30 mg orally daily. Dysphagia  is no longer an issue, status post dilation of her ring.   She has had some mild intermittent nausea but no vomiting. She may have  some sensation for 20-30 minutes every other day. 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 She has not had any gastric emptying studies. She does not  have any abdominal pain. She has not had any hematochezia or melena. She  is back on Fosamax and is not having difficulties again with any food or  pill ingestion.   CURRENT MEDICATIONS:  See updated list.   ALLERGIES:  PENICILLIN, SEPTRA, CODEINE, ASPIRIN, BIAXIN, NASACOL and  ENTEX.   PHYSICAL EXAMINATION:  She looks well, weight 156. Height 5 feet 4-1/2  inches. Temperature 99.4, blood pressure 118/88, pulse 92.  SKIN:  Warm and dry.  CHEST:  Lungs are clear to auscultation.  CARDIOVASCULAR:  Regular rate and rhythm without murmur, gallop, or rub.  ABDOMEN:  Nondistended, positive bowel sounds. Soft, no succussion  splash.  No appreciable mass or organomegaly.   ASSESSMENT:  1. Ulcerative colitis in remission. Will continue Colazal. I told her      she could probably go ahead and stop her Flora-Q. If she goes on      antibiotics in the future, I would take Flora-Q concomitantly  and      extend Flora-Q therapy for two weeks beyond last of antibiotics.  2. Gastroesophageal reflux disease symptoms well controlled on      Prevacid. We continue that agent, thus at dose at 30 mg orally      daily.  3. Transient recurrent nausea, may or may not be related to      gastroparesis. She is on multiple medications. Any number of      factors could be at play. I have asked her to minimize Bentyl if      she does really need it as it does have some obvious      anticholinergic effects and could diminish gut motility.   Unless something comes up, the plan is to see this nice lady back in one  year. Should the nausea worsen in the interim I would move towards solid-  phase gastric emptying study.       Bridgette Habermann, M.D.  Electronically Signed     RMR/MEDQ  D:  12/08/2007  T:  12/09/2007  Job:  [id number]   cc:   Gar Ponto

## 2011-02-11 NOTE — Op Note (Signed)
NAME:  [name], [name]             ACCOUNT NO.:  [id number]   MEDICAL RECORD NO.:  [id number]          PATIENT TYPE:  AMB   LOCATION:  DAY                           FACILITY:  [facility]   PHYSICIAN:  R. Garfield Cornea, M.D. DATE OF BIRTH:  05-06-1951   DATE OF PROCEDURE:  07/12/2007  DATE OF DISCHARGE:                               OPERATIVE REPORT   PROCEDURE:  EGD with [name] dilation followed by esophageal mucosal  brushing.   INDICATIONS FOR PROCEDURE:  A 60 year old lady with a history of  recurrent esophageal dysphagia in the setting of a known Schatzki's  ring.  She was last dilated with a 54-French Maloney dilator back in  2006.  She is on Prevacid.  She is not having any odynophagia.  She is  also taking Fosamax once weak.  Recent barium esophagram demonstrated  obstruction of passage at the GE junction.  EGD is now being done to  further evaluate her symptoms, provide intervention as appropriate.  This approach has been discussed with the patient at length.  Potential  risks, benefits and alternatives have been reviewed, questions answered.  She is agreeable.  Please see documentation in the medical record.   PROCEDURE NOTE:  O2 saturation, blood pressure, pulse and respirations  were monitored throughout the entire procedure.   CONSCIOUS SEDATION:  Versed 4 mg IV, Demerol 75 mg IV in divided doses.  Cetacaine spray for topical pharyngeal anesthesia.   INSTRUMENT:  Pentax video chip system.   FINDINGS:  Examination of the tubular esophagus revealed a prominent  Schatzki's ring.  There was also some focal whitish plaques which did  not readily easily wash off.  The remainder of the esophageal mucosa  appeared normal.  EG junction easily traversed with  the scope.   Stomach:  Gastric cavity was emptied and insufflated well with air.  Thorough examination of the gastric mucosa including retroflexed view of  the proximal stomach and esophagogastric junction demonstrated only  a  moderate size hiatal hernia.  Pylorus patent, easily traversed.  Examination of the bulb and second portion revealed no abnormalities.   THERAPEUTIC/DIAGNOSTIC MANEUVERS PERFORMED:  The scope was withdrawn and  a 56-French Maloney dilator was passed to full insertion with ease.  Look back revealed the ring had been ruptured without apparent  complication.  Subsequently the focal area of whitish plaques in the  esophagus were brushed for KOH prep.  The patient tolerated the  procedure well as reactive to endoscopy.   IMPRESSION:  1. Prominent Schatzki's ring, status post dilation as described above.  2. Focal area of adherent white plaques suspicious for early Candida      esophagitis, although the patient is not having any other typical      symptoms of this problem.  She is a diabetic.  KOH prep has been      performed, otherwise normal esophagus.  3. Hiatal hernia otherwise normal stomach, D1 and D2.   RECOMMENDATIONS:  1. Continue Prevacid 30 mg orally daily.  2. Follow up on KOH prep.  3. Further recommendations to follow.      Otelia Limes  Rourk, M.D.  Electronically Signed     RMR/MEDQ  D:  07/12/2007  T:  07/12/2007  Job:  [id number]   cc:   [name], PA-C  [place], [place]

## 2011-02-11 NOTE — Assessment & Plan Note (Signed)
NAMEWINNA, [name]              CHART#:  [id number]   DATE:  03/08/2007                       DOB:  01/25/1951   Followup pan proctocolitis, predominantly left sided, last seen  12/15/2006.  She has done well, 1-2 bowel movements daily.  A little  blood just yesterday, but none since she was last seen on 12/15/2006.  She has gained 2 pounds.  She has not had a recent metabolic 7.  She has  done well, reflux symptoms well controlled on Prevacid 30 mg orally  daily.   She does have a bulge around her umbilicus.  History of umbilical  hernia, wants to have something done about it.   CURRENT MEDICATIONS:  See updated list.   ALLERGIES:  PENICILLIN, SEPTRA, CODEINE, ASPIRIN, BIAXIN, ENTEX, ASACOL.   PHYSICAL EXAMINATION:  Looks well.  Weight 157, height 5 feet 4 inches, temperature 98.2, BP 100/70, pulse  76.  CHEST:  Lungs are clear to auscultation.  CARDIAC EXAM:  Regular rate and rhythm without murmurs, gallops, or  rubs.  ABDOMEN:  Nondistended, positive bowel sounds, soft.  When tensing her  abdominis rectal muscle, she does have a golf-ball sized umbilical  hernia.  It is easily reducible.  EXTREMITY EXAM:  No edema.   ASSESSMENT:  Pan __________ .  Pretty much is remission on Colazal 3  tablets 3 times daily.  A little blood yesterday per rectum, I really do  not know what to make of that at this point in time.  She has really not  had any constipation or diarrhea.  Gastroesophageal reflux disease  symptoms well controlled.  Umbilical hernia, symptomatic.   RECOMMENDATIONS:  1. Continue Prevacid 30 mg orally daily.  Samples given.  Continue      Align 1 capsule daily.  2. Continue Colazal 3 tablets 3 times daily for now.  Will check a      CBC, BMET, and a sed rate.  3. Elective appointment to see Dr. Aviva Signs in [place] (her      location of choice) for consideration of umbilical hernia repair.      She is getting her right foot operated on the week  after next by Dr. Berline Lopes.  Will plan to get her to see Dr. Arnoldo Morale      in mid July.  Will plan to see this nice lady back in 6 months.       Bridgette Habermann, M.D.  Electronically Signed     RMR/MEDQ  D:  03/08/2007  T:  03/08/2007  Job:  [id number]   cc:   [name], PA-C

## 2011-02-11 NOTE — H&P (Signed)
NAME:  [name], [name]             ACCOUNT NO.:  [id number]   MEDICAL RECORD NO.:  [id number]          PATIENT TYPE:  AMB   LOCATION:  DAY                           FACILITY:  [facility]   PHYSICIAN:  R. Garfield Cornea, M.D. DATE OF BIRTH:  [birth year]   DATE OF ADMISSION:  DATE OF DISCHARGE:  LH                              HISTORY & PHYSICAL   PRIMARY CARE PHYSICIAN:  [name], [place] [facility] [place].   Physician signing note is Dr. Gala Romney.   REASON FOR CONSULTATION:  Dysphagia.   HISTORY OF PRESENT ILLNESS:  [name] is a 60 year old Caucasian  female patient, well known to Dr. Gala Romney, with a history of pan-  ulcerative colitis.  She also has a history of dysphagia.  She was  eating on Thursday and developed a difficulty with swallowing.  She was  seen in the emergency room at [facility], that day. She  was seen by [name] [facility], on June 25, 2007.  She had a barium pill  esophagram which showed a narrowing at the GE junction with smooth  margins.  A 13-mm barium tablet would not passed through this area.  She  has had problems with both solids and liquids.  She has had occasional  regurgitation.  She is having problems on a daily basis.  At this time,  she had a history of previous Maloney dilatation, secondary to  Schatzki's ring on July 02, 2005, and a moderate hiatal hernia.  She  denies any rectal bleeding or melena.  Denies any heartburn,  indigestion.  Denies any abdominal pain, nausea or vomiting.  She is on  aspirin, Colazal and iron, at this time.   PAST MEDICAL SURGICAL HISTORY:  She is status post incisional  herniorrhaphy with mesh by Dr. Arnoldo Morale on April 26, 2007.  She has  history of pan ulcerative colitis well-controlled on Colazal.  She  previously developed pancreatitis, while on a Asacol.  She had an EUS at  [facility], which was normal.  She was found to have marked inflammatory  change in the rectum and left colon with tracking abnormalities all  the  way to the cecum, consistent with pain and proctocolitis on colonoscopy  in December 03, 2005.  She has history of seasonal allergies, depression,  tonsillectomy and D&C.   MEDICATIONS:  Current medications:  1. Wellbutrin XL 150 mg daily.  2. Lexapro 10 mg daily.  3. __________ 10 mg daily.  4. Prevacid 30 mg daily.  5. Trazodone 50 mg q.h.s.  6. Aspirin 81 mg daily.  7. Metformin 500 mg in the morning, 1500 mg in the evening.  8. Ferrous gluconate 325 mg daily.  9. Flora Q probiotics once daily.  10.Colazal 2.25 grams  t.i.d.  11.Astelin 2 sprays at age nostril daily.  12.Optivar two drops in each eye b.i.d.  13.Citrucel  b.i.d.  14.Fosamax 70 mg weekly.  15.Calcium 600 mg plus vitamin D b.i.d.  16.Tramadol HCl/a.c.e  t.i.d. p.r.n.  17.Albuterol p.r.n.  18.Advair discus 100/50 mg b.i.d.  19.Dicyclomine 10 mg q.i.d.   ALLERGIES:  Penicillin, Ceftin, codeine, aspirin, Biaxin, Entex  and  Asacol cause pancreatitis.   FAMILY HISTORY:  Father had a history of colonic polyps.   SOCIAL HISTORY:  [name] is married.  She has three children.  She  denies any alcohol or drug use.  She did smoke intermittently.   REVIEW OF SYSTEMS:  See HPI, otherwise negative.   PHYSICAL EXAM:  VITAL SIGNS: Weight 149 pounds, 104 inches to 90 feet  seven blood pressure 120/80, pulse 68.  GENERAL:  [name] is a well-developed, well-nourished Caucasian  female, in no acute distress 48.  HEENT:  Sclerae clear, nonicteric conjunctivae, oropharynx pink and  moist without lesions.  NECK:  Supple without thyromegaly.  Chest: Heart regular rhythm with normal V7-C5 no murmurs, clicks, rubs  or gallops auscultation bilaterally.  ABDOMEN:  Positive bowel sounds x4.  No bruits auscultated.  Soft,  nontender, nondistended, without palpable mass or hepatosplenomegaly.  No rebound tenderness or guarding.  EXTREMITIES:  Without clubbing or edema bilaterally.  SKIN:  Pink, warm and dry without any  rash or jaundice.   IMPRESSION:  [name] is 60 year old Caucasian female with recurrent  esophageal dysphagia with barium pill esophagram suggestive of narrowing  at the GE junction, with tablet with 13-mm tablet unable to pass through  this area.  I suspect she has recurrent critical Schatzki's ring and is  going to need a repeat dilatation.  I have discussed this case with Dr.  Gala Romney, and he is requesting that Dr. Stann Mainland has do this esophageal  dilatation in his absence.    She has history of pan-ulcerative colitis, under good control on Colazal  at this time.   PLAN:  1. EGD with ED with Dr. Stann Mainland in the near future to discuss procedure,      including risks and benefits to include but not limited to      bleeding, infection, perforation and drug reaction.  She a0grees      with plan.  Consent will be obtained.  2. She is to hold her aspirin three days prior to exam.  3. She is to continue her Prevacid 30 mg daily.  4. Liquid/pureed/soft diet, until esophageal dilatation.      [name], N.P.      [name], M.D.  Electronically Signed    KJ/MEDQ  D:  06/28/2007  T:  06/28/2007  Job:  [id number]

## 2011-02-11 NOTE — Assessment & Plan Note (Signed)
NAME:  [name], [name]              CHART#:  [id number]   DATE:  05/05/2008                       DOB:  02-23-1951   FOLLOWUP:  1,  Ulcerative colitis, gastroesophageal reflux disease,  history of dysphagia, status post dilation, Schatzki's ring, and  dysphagia, resolved.  1. Asacol-induced pancreatitis.  2. Postprandial nausea, last seen 12/08/2007.  She was all in all      doing well except for some postprandial nausea and has a question      in regards to gastroparesis.  She is off Bentyl now.  She continues      on Fosamax and Prevacid.  She is having 1 to 4 bowel movements      daily.  No tenesmus or rectal bleeding.  Overall, she is felt to be      doing extremely well.   She is diabetic.  Her hemoglobin A1c has been in 5 to 6 range.  She does  have early satiety and postprandial nausea, but rarely ever vomits.  She  has actually gained 7 pounds since her last visit.   CURRENT MEDICATIONS:  See updated list.   ALLERGIES:  Penicillin, Ceftin, codeine, aspirin, Biaxin, Asacol, Entex,  Cefzil, and albumin/eggs.   PHYSICAL EXAMINATION:  GENERAL:  Today, a 46-year lady who appears her  baseline.  VITAL SIGNS:  Weight 163, height 5 feet 4-1/2 inches, temperature 92, BP  118/76, and pulse 72.  SKIN:  Warm and dry.  CHEST:  Lungs are clear to auscultation.  CARDIAC:  Regular rate and rhythm without murmur, gallop, or rub.  ABDOMEN:  Nondistended.  Positive bowel sound.  No succussion splash.  Abdomen is soft and nontender without appreciable mass or  hepatosplenomegaly.  EXTREMITIES:  No edema.   ASSESSMENT:  1. Ulcerative colitis in remission.  2. Gastroesophageal reflux disease symptoms, well controlled.  3. Postprandial nausea and diabetic, need to evaluate gastroparesis.  4. Not mentioned above, she is status post cholecystectomy.   RECOMMENDATIONS:  1. Reemphasize swallowing precautions with Fosamax.  Continue Prevacid      30 mg orally daily.  2. Proceed with the  solid-phase gastric emptying study to evaluate for      gastroparesis in the very near future.   She will have to be given something other than eggs as a substrate.  We  will likely go with hamburger or oatmeal.  Further recommendations to  follow.       Bridgette Habermann, M.D.  Electronically Signed     RMR/MEDQ  D:  05/05/2008  T:  05/05/2008  Job:  [id number]

## 2011-02-11 NOTE — H&P (Signed)
NAME:  [name], [name]             ACCOUNT NO.:  [id number]   MEDICAL RECORD NO.:  [id number]          PATIENT TYPE:  AMB   LOCATION:  DAY                           FACILITY:  [facility]   PHYSICIAN:  Jamesetta So, M.D.  DATE OF BIRTH:  06-27-1951   DATE OF ADMISSION:  DATE OF DISCHARGE:  LH                              HISTORY & PHYSICAL   CHIEF COMPLAINT:  Incisional hernia.   HISTORY OF PRESENT ILLNESS:  The patient is a 60 year old white female  who is referred for evaluation and treatment of an incisional hernia.  She underwent laparoscopic cholecystectomy in the past and now has  swelling above the umbilicus at the surgical site.  It is made worse  with straining.   PAST MEDICAL HISTORY:  1. Depression.  2. Reflux disease.  3. Extrinsic allergies.  4. COPD.  5. Inflammatory bowel disease.   PAST SURGICAL HISTORY:  1. Cholecystectomy.  2. Tonsillectomy.  3. D&C.   CURRENT MEDICATIONS:  1. Wellbutrin.  2. Lexapro.  3. Zyrtec.  4. Prevacid.  5. Dicyclomine.  6. Trazodone.  7. Baby aspirin.  8. Metformin.  9. Iron supplements.  10.Probiotic.  11.Colazal.  12.Astelin nasal spray.  13.Optivar eye drops.  14.Citrucel.  15.Fosamax.  16.Tramadol.  17.Albuterol inhaler.  18.Advair.   ALLERGIES:  PENICILLIN, CODEINE, ASPIRIN, CEFZIL, ENTEX, ASACOL.   REVIEW OF SYSTEMS:  The patient denies drinking or smoking.  She denies  any recent chest pain, MI, CVA, or bleeding disorder.   PHYSICAL EXAMINATION:  GENERAL:  The patient is a well-developed, well-  nourished white female in no acute distress.  LUNGS:  Clear to auscultation with equal breath sounds bilaterally.  HEART:  Regular rate and rhythm without S3, S4, murmurs.  ABDOMEN:  Soft , nontender, nondistended.  No hepatosplenomegaly or  masses noted.  A reducible supraumbilical hernia is present.   IMPRESSION:  Incisional hernia.   PLAN:  The patient is scheduled for incisional herniorrhaphy on April 26, 2007.   The risks and benefits of the procedure including bleeding,  infection, and recurrence of the hernia due to steroid use were fully  explained to the patient, gave informed consent.      Jamesetta So, M.D.  Electronically Signed     MAJ/MEDQ  D:  04/13/2007  T:  04/14/2007  Job:  [id number]   cc:   Short Stay at [place]. Garfield Cornea, M.D.  [address]  [place]  [place] [address]   [facility] Amy Luciana Axe

## 2011-02-11 NOTE — Op Note (Signed)
NAME:  [name], [name]             ACCOUNT NO.:  [id number]   MEDICAL RECORD NO.:  [id number]          PATIENT TYPE:  EMS   LOCATION:  ED                            FACILITY:  [facility]   PHYSICIAN:  R. Garfield Cornea, M.D. DATE OF BIRTH:  [birth year]   DATE OF PROCEDURE:  11/24/2008  DATE OF DISCHARGE:                               OPERATIVE REPORT   PROCEDURE:  Emergency EGD with food impaction removal.   INDICATIONS FOR PROCEDURE:  A 59 year old lady with gastroesophageal  reflux disease and recurrent esophageal dysphagia secondary to  Schatzki's ring was feeling fairly well until last evening when she  swallowed a piece of pork tenderloin and felt it lodge behind breast  bone.  She has been able to  swallow saliva or anything else.  She  presented to the emergency department and saw Dr. Lacinda Axon last night.  He  called [place] this morning for intervention.  Urgent EGD is now being done  to remove food impaction as appropriate.  I discussed this with Ms.  Paiz/ I told her that she if she does have significant re-formation  of the ring that we would not be able to dilate and that we would bring  her back electively on a day when her stomach is empty.  Questions were  answered.  She is agreeable.   PROCEDURE NOTE:  O2 saturation, blood pressure, pulse, and respirations  monitored throughout the entire procedure.  Conscious sedation:  Versed  3 mg IV Demerol 50 mg IV in divided doses.  Cetacaine spray gargled,  expectorated, and swallowed.  Instrumentation Pentax video chip system.   FINDINGS:  Examination of the tubular esophagus revealed a Schatzki's  ring with a couple of small pieces of food debris just above the ring.  There was no large bolus.  These small pieces were easily pushed into  the stomach.  The mucosa at the level the ring was circumferentially  macerated and there were erosions present.  The scope was easily  advanced into the stomach where a large food bolus was sitting in  the  body.  Please see photos.  Because there was food in her stomach, a  complete exam was not carried out.  The  exam was terminated.  The  patient tolerated the procedure very well and was reactive.   ENDOSCOPIC IMPRESSION:  Residual food the distal stomach pushed through.  I believe the patient did have a recent meat bolus impaction and  spontaneously passed the largest piece which was found in the gastric  body.  Prominent Schatzki's ring with erosions and maceration macerated  mucosa consistent with the trauma of a meat impaction.   RECOMMENDATIONS:  1. Continue Prevacid 30 milligrams orally daily.  2. Soft diet.  Would avoid meat until we get her esophagus dilated.  3. Will also tell [name] to please not take any Fosamax until we      get her esophagus dilated up.  4. My office will be in touch with her to set her up for an elective      EGD with esophageal dilation in the next  week or two.      Bridgette Habermann, M.D.  Electronically Signed     RMR/MEDQ  D:  11/25/2008  T:  11/25/2008  Job:  [id number]   cc:   Gar Ponto  Fax: [phone]

## 2011-02-11 NOTE — Op Note (Signed)
NAME:  [name], [name]             ACCOUNT NO.:  [id number]   MEDICAL RECORD NO.:  [id number]          PATIENT TYPE:  AMB   LOCATION:  DAY                           FACILITY:  [facility]   PHYSICIAN:  Caro Hight, M.D.      DATE OF BIRTH:  05-28-1951   DATE OF PROCEDURE:  07/01/2007  DATE OF DISCHARGE:                               OPERATIVE REPORT   PROCEDURE:  Esophagogastroduodenoscopy with Savary dilation.   INDICATION FOR EXAM:  Ms. Kachmar is a 60 year old female who has a  history of esophageal dilation with a Maloney dilator in October 2006.  She was dilated with a [place].  She reports that within the  last week she began to have difficulty swallowing pills, solids and  liquids.   FINDINGS:  1. Distal Schatzki's ring which narrowed the lumen to approximately 10-      11 mm.  The scope passed with resistance.  The distal esophagus was      dilated successively using the Savary dilators from 11 mm to 12.8      mm.  The last dilator passed with mild resistance.  2. Otherwise, the esophagus without evidence of Barrett's, mass,      erosion or ulceration.  3. Normal stomach and duodenum.   RECOMMENDATIONS:  1. She should hold all aspirin and anticoagulation until her      esophageal dilation is complete.  2. EGD with dilation with Dr. Gala Romney on July 12, 2007.  3. She should follow a soft diet.  She should consume meats only if      they are chopped or ground.   MEDICATIONS:  1. Demerol 75 mg IV.  2. Versed 4 mg IV.  3. Phenergan 6.25 mg IV.   PROCEDURE TECHNIQUE:  Physical exam was performed.  Informed consent was  obtained from the patient after explaining the benefits, risks and  alternatives to the procedure. The patient was connected to the monitor  and placed in the left lateral position.  Continuous oxygen was provided  by nasal cannula and IV medicine administered through an indwelling  cannula.  After administration of sedation, the patient's esophagus  was  intubated.  The scope  was advanced under direct visualization to the second portion of the  duodenum.  The scope was removed slowly by carefully examining the  color, texture, anatomy, and integrity of the mucosa on the way out.  The patient was recovered in endoscopy and discharged home in  satisfactory condition.      Caro Hight, M.D.  Electronically Signed     SM/MEDQ  D:  07/01/2007  T:  07/01/2007  Job:  [id number]   cc:   Consuello Masse  Fax: [phone]

## 2011-02-11 NOTE — Assessment & Plan Note (Signed)
NAME:  [name], [name]              CHART#:  [id number]   DATE:  09/01/2007                       DOB:  10-08-1950   Follow up pan ulcerative colitis, GERD, history of dysphagia secondary  to Schatzki's ring requiring dilation back in October of this year.  Ms.  [name] has done extremely well from the standpoint of dysphagia.  She  was last dilated by me up to a 56 [place] size [employer] dilators.  She is  not having any dysphagia.  Taking Prevacid 30 mg orally daily has  abolished her reflux symptoms.  Her UC appears to be in remission.  She  has 1-2 bowel movements daily and no blood per rectum, diarrhea,  tenesmus.  She is tolerating Colazal extremely well.  She was told to  stop her Fosamax until further notice by the folks over at [place].   She is getting along very well with Dicyclomine 10 mg q.i.d. p.r.n. for  occasional tendency towards diarrhea.   She is not having any abdominal pain.  She has vague symptoms of nausea  after she eats her lunch time meal, but never vomits.  Really does not  have any early satiety and she has gained 4 pounds since she was last  seen.   CURRENT MEDICATIONS:  See admit list.   ALLERGIES:  PENICILLIN, CEFTIN, CODEINE, ASPIRIN, BIAXIN, ENTEX, ASACOL  (pancreatitis).   PHYSICAL EXAMINATION:  She appears well.  Weight 154, height 5 foot 4-  1/2 inches, temperature 98.4, BP 118/70, pulse 64.  SKIN:  Warm and dry.  CHEST:  Lungs are clear to auscultation.  HEART:  Regular rate and rhythm without murmurs, gallops or rubs.  ABDOMEN:  Nondistended, positive bowel sounds.  No succussion splash.  Soft, nontender. No appreciable mass or organomegaly.   ASSESSMENT:  1. Pan UC in remission.  Will continue Colazal.  She may have had a B-      met over at [place] in the last couple of months.  I would like      to get a copy of that and make sure her creatinine is OK.  2. GERD/dysphagia, resolved status post dilation of Schatzki's ring.      I feel  the benefits of Fosamax would outweigh the potential      downside as long as dysphagia symptoms in general are better and      she is adhering to the swallowing precautions of adequate fluids      and staying upright for 30 minutes after ingesting her Fosamax.  I      have recommended she resume this agent.  Should she have increasing      dysphagia symptoms she is to alert me at once.  3. Mid day postprandial nausea is quite vague. I do not implicate any      medications.  She may have an element of underlying diabetic      gastroparesis in evolution.   RECOMMENDATIONS:  I suggest she break her meals up to 4 or 5 smaller  meals rather than three main meals daily.  If this does not work in a  couple of weeks she is to let me know and we will consider further  evaluation.  Otherwise plan to see this nice lady back in 3 months.       Bridgette Habermann,  M.D.  Electronically Signed     RMR/MEDQ  D:  09/01/2007  T:  09/02/2007  Job:  [id number]   cc:   [place]

## 2011-02-14 NOTE — Consult Note (Signed)
[name], [name]             ACCOUNT NO.:  [id number]   MEDICAL RECORD NO.:  [id number]          PATIENT TYPE:  INP   LOCATION:  A225                          FACILITY:  [facility]   PHYSICIAN:  Fay Records, M.D.    DATE OF BIRTH:  07-03-1951   DATE OF CONSULTATION:  12/15/2005  DATE OF DISCHARGE:                                   CONSULTATION   IDENTIFICATION:  Ms. Lavergne is a 60 year old woman who we are asked to see  regarding chest pain.   HISTORY OF PRESENT ILLNESS:  The patient has no known history of coronary  artery disease.  She was recently discharged from [facility].  We  were consulted because of abnormal EKG and palpitations.  Recommended  outpatient Myoview when medical condition stabilized.  The patient was  admitted for abdominal pain, probable pancreatitis.  The patient went home,  and she notes on Friday she began having chest pain, right shoulder that  went across her chest accompanied by some shortness of breath with shaky  sensation the last 2-3 minutes at most.  Note, she had URI symptoms seen by  primary MD.  Levaquin started Friday.  On Saturday, she had left parasternal  (low) pain that was sharp and then dull, stayed the whole day.  It started  11 a.m., lasting until she got to the hospital.  She thinks it ended  Saturday night or Sunday.  Notes that this pain is pleuritic, and it is  worse with moving her upper body.  She has cough that is productive of  sputum that is brownish and greenish.  Sunday, she still had some right to  left sided pain, just felt dopey because of the pain medications.  Positive  fever and chills.  The patient notes a little dysphagia.  Had dilatation in  October 2006.  Notes occasional palpitations.  No syncope.  Chest pain is  all new.  Never had before.   ALLERGIES:  PENICILLIN, BIAXIN, CEFTIN, ENTEX, CODEINE AND ASPIRIN.   MEDICATIONS PRIOR TO ADMISSION:  1.  Wellbutrin 150 daily.  2.  Lexapro 10 mg daily.  3.   Zyrtec 10 mg daily.  4.  Prevacid 30 mg daily.  5.  Clidinium q.i.d. p.r.n.  6.  Zetia 10 mg daily.  7.  Trazodone 50 q.h.s.  8.  Astelin q.h.s.  9.  __________ OU b.i.d.  10. Citracal t.i.d. p.r.n.  11. Fosamax q.week.  12. Calcium with vitamin D b.i.d.  13. Tramadol t.i.d. p.r.n.  14. Advair b.i.d.  15. Albuterol inhaler p.r.n.  16. Levaquin 750 daily, started March 16.  17. Hydrocortisone.  18. Rectal enemas.  19. Prednisone 40 mg daily.   PAST MEDICAL HISTORY:  1.  GE reflux.  2.  Ulcerative colitis diagnosed December 03, 2005.  3.  History of dysphagia with esophageal dilatation October 2006.  4.  History of hiatal hernia.  5.  Diverticulosis.  6.  Depression.  7.  Cholecystectomy.  8.  Tonsillectomy.  9.  Pancreatitis.   SOCIAL HISTORY:  The patient lives in [place], [place], is married with  three  children, quit tobacco in 2001 after a 30 pack year.  Does not drink.  Is a teacher's aide.   FAMILY HISTORY:  Mother has a pig valve at age 15.  Father died at age 76 of  a PE after surgery.  One brother with __________.   REVIEW OF SYSTEMS:  Fever and chills as noted.  Positive chest pressure,  shortness of breath as noted.  Denies PND. Cough as noted.  GU negative.  Musculoskeletal negative.  GI as noted above.  Note:  Echocardiogram in  December 08, 2005, noted mild left atrial abnormality.  LV function was normal.  No wall motion abnormalities.  Otherwise, all systems reviewed, negative to  above problems as noted.   PHYSICAL EXAMINATION:  VITAL SIGNS:  Temperature 100, pulse 69, respirations  19, blood pressure 136/75, O2 saturation on 2 liters 94%.  GENERAL APPEARANCE:  Patient in no acute distress.  HEENT:  Normocephalic, atraumatic.  PERRLA.  EOMI.  NECK:  Minus JVD.  Minus bruits.  LUNGS:  Clear.  CARDIAC:  Regular rate and rhythm.  S1, S2.  Minus S3, S4, murmurs.  ABDOMEN:  Supple.  CHEST:  Tender left parasternal region.  Reproduces pain.  EXTREMITIES:  2+  pulses, minus edema.   STUDIES:  A 12-lead EKG shows normal sinus rhythm at a rate of 64 beats per  minutes with Q wave inversion V1-V6, unchanged from previous.  Chest x-ray  no acute disease.   LABORATORY DATA:  Significant for hemoglobin of 9.4, hematocrit 28.8.  Note  MCHC and MCV are normal.  WBC of 15.2 with 84 neutrophils, 10 lymphocytes, 6  monocytes.  Note:  Potassium on admission was 27, now 38.  BUN and  creatinine of 4 and 0.7.  Glucose 237.  CK MB and troponin negative x3.  Amylase 42, lipase 38.  UA significant for pH of 7, specific gravity of  1.025, 11-20 WBC's, 7-10 RBC's, moderate leukocyte esterase, nitrite  negative, few bacteria.   IMPRESSION:  The patient is a 60 year old with an abnormal EKG seen just one  week ago, recommended outpatient Myoview scan.  Comes in with chest pain,  two types, one is right sided going to mid chest, the other is left sided.  Neither is convincing for coronary ischemia.  In fact, the left sided pain  seems to be more muscular, reproduced today, is pleuritic positional.  She,  at present, has a low grade fever, evidence of a UTI, is on Levaquin and  still has some cough with yellowish/greenish sputum.   I do not seen any acute changes that would necessitate emergent evaluation.  She has medical issues that need treating and clarifying.  She is anemic and  is due to be seen with GI today.  Question if any other evaluation is  needed.  The patient had upper endoscopy with dilatation in the past.  Question if this is indeed esophageal stricture, at least partially  explaining her symptoms/presentation.   I still think she needs a Myoview scan at some point.  But again, do not  think it is emergent.  Again, this is more based on her EKG changes, not  current symptoms.  Would schedule when her medical condition stabilizes. Some may indeed still be done as an outpatient.  Will continue to follow.   ADDENDUM:  Await GI.            ______________________________  Fay Records, M.D.     PVR/MEDQ  D:  12/15/2005  T:  12/16/2005  Job:  [id number]

## 2011-02-14 NOTE — Discharge Summary (Signed)
NAMEJESSIE, [name]             ACCOUNT NO.:  [id number]   MEDICAL RECORD NO.:  [id number]          PATIENT TYPE:  INP   LOCATION:  A202                          FACILITY:  [facility]   PHYSICIAN:  Aquilla Hacker, M.D. DATE OF BIRTH:  May 14, 1951   DATE OF ADMISSION:  12/06/2005  DATE OF DISCHARGE:  03/13/2007LH                                 DISCHARGE SUMMARY   CONSULTATIONS:  1.  Gastroenterology  2.  Cardiology with Dr. Lattie Haw, [facility]   PROCEDURES:  CT scan of the abdomen and pelvis completed December 06, 2005.   HISTORY OF PRESENT ILLNESS:  [name] is a 60 year old female who arrived  with a chief complaint of abdominal pain.  She indicated that she had  experienced three days of abdominal pain.  Her pain was described as being  diffuse, primarily in the left flank radiating to the back.  The pain  rotates between sharp and dull and was described as intermittent.  She could  not deny any relieving or aggravating factors and it was described as 10/10.  For past medical history please see that dictated by Dr. Audria Nine.   HOSPITAL COURSE:  #1 - ACUTE PANCREATITIS:  The patient's lipase was found  to be elevated at 145.  She had a CT scan of the pelvis and abdomen  completed on December 06, 2005.  The CT of the abdomen revealed no evidence of  hydronephrosis or urinary tract calculus.  There was a questionable  pancreatitis present.  CT scan of the pelvis revealed the presence of an  umbilical hernia.  Gastroenterology was consulted and they agreed with  symptomatic treatment of the patient.  The patient was kept n.p.o. and  provided with aggressive IV fluid hydration during the initial portion of  her hospitalization.  It appears that Asacol was discontinued and Solu-  Medrol was started but the following day the patient indicated that she felt  better.  Her diet was advanced and her IV fluids were decreased.  It  appeared as though patient's pancreatitis was  starting to remit.  The  etiology of the patient's pancreatitis was questionable.  Again, GI's note  indicated that there may have been a relationship between the patient's  Asacol and the acute pancreatitis.  By March 12 the patient was switched to  prednisone and hydrocortisone enemas.  The patient's abdominal symptoms  continued to improve throughout the course of her hospitalization.   #2 - ARRHYTHMIA:  The patient appears to have had an arrhythmia on March 12.  [facility] was consulted and it appeared that the patient was having  frequent PACs likely exacerbated by physiologic and psychologic stress,  according to Dr. Izell [place] note.  T-wave inversions were observed on the  patient's EKG and it looks as though some have been new and some old.  Dr.  Lattie Haw indicated that these could have possibly been related to the  patient's pancreatitis.  By March 13 the patient was noted to have had a  decreased frequency of PACs and it was believed that this may have been a  reflection of the patient's  continued recovery.  Dr. Lattie Haw went on to  indicate that the patient had an abnormal EKG and stated that the patient  will need a stress test once her GI issues resolve.  By March 13 the patient  indicated that she had a momentary episode of dizziness when she initially  got out of bed to go to the bathroom; however, it had resolved.  Her  abdominal pain had improved significantly to approximately 1/10.  She  requested to be discharged on that day.  Therefore, the patient was  discharged to home.  She was discharged home on the following medications.   DISCHARGE MEDICATIONS:  Phenergan 12.5 mg one tablet p.o. to be used as  p.r.n.  The patient was instructed to follow up with gastroenterology within  two weeks and to call her primary care doctor within two to four weeks for  follow-up evaluation.  She was also told to resume all of her previously  prescribed home medication.       Aquilla Hacker, M.D.  Electronically Signed     OR/MEDQ  D:  04/16/2006  T:  04/17/2006  Job:  [id number]

## 2011-02-14 NOTE — Consult Note (Signed)
NAME:  [name], [name]             ACCOUNT NO.:  [id number]   MEDICAL RECORD NO.:  [id number]          PATIENT TYPE:  INP   LOCATION:  A225                          FACILITY:  [facility]   PHYSICIAN:  R. Garfield Cornea, M.D. DATE OF BIRTH:  1951/01/24   DATE OF CONSULTATION:  12/15/2005  DATE OF DISCHARGE:                                   CONSULTATION   REFERRING PHYSICIAN:  Toby L. Fugate, D.O.   REASON FOR CONSULTATION:  UC.   HISTORY OF PRESENT ILLNESS:  [name] is a 60 year old, Caucasian female  who was diagnosed with ulcerative colitis on December 03, 2005.  She had a  history of abdominal cramps and bloody diarrhea.  She was found to have  marked inflammatory changes of the rectum and left colon with striking  abnormalities all the way to the cecum consistent with panproctocolitis.  She also had internal hemorrhoids and sigmoid diverticula.  She was started  on Asacol and hydrocortisone enemas and prednisone.  She then developed  pancreatitis and was admitted to [facility].  It was felt that her  pancreatitis could be related to Asacol use.  This was discontinued.  Her  prednisone was increased to 40 mg daily and she continued to do a nightly  hydrocortisone enema.  Approximately 2 days ago, she developed right upper  anterior chest pain which then radiated to her left upper anterior chest.  She was seen by Dr. Quintin Alto and felt to have upper respiratory infection as  she had a nonproductive cough as well as sore throat.  She was started on  Levaquin.  She complains of fevers and chills.  She was found to have  hyponatremia and hypokalemia on admission.  She complains of some crampy,  bilateral lower quadrant pain.  She has had bloody diarrhea.  Approximately  2 days ago, she had up to 10 stools a day.  Today, she has had two loose  bloody stools.  She complains of nausea and denies any vomiting.  She is  scheduled for EUS at [facility] on April 10.   PAST MEDICAL HISTORY:  1.  Colonoscopy as described in the HPI.  2.  Ulcerative colitis, recent diagnosis.  3.  EGD with [facility] dilatation.  4.  On July 02, 2005, she had a critical Schatzki's ring, moderate sized      hiatal hernia.  5.  Seasonal allergies.  6.  Depression.  7.  Tonsillectomy.  8.  D&C.   MEDICATIONS PRIOR TO ADMISSION:  1.  Fosamax 70 mg weekly.  2.  Calcium with Vitamin D 600 mg b.i.d.  3.  Tramadol HCl t.i.d. p.r.n.  4.  Advair 100/50 mg b.i.d.  5.  Albuterol inhaler two puffs q.4h. p.r.n.  6.  Levaquin 750 mg p.o. daily.  7.  Hydrocortisone enemas nightly.  8.  Prednisone 20 mg two p.o. daily.  9.  Wellbutrin 150 mg p.o. daily.  10. Lexapro 10 mg daily.  11. Zyrtec 10 mg daily.  12. Prevacid 30 mg daily.  13. Librax one p.o. four times a day p.r.n.  14. Zetia 10 mg daily.  15. Trazodone 50 mg nightly.  16. Astelin two sprays to each nostril daily.  17. Citrucel t.i.d. p.r.n.   ALLERGIES:  PENICILLIN, CODEINE, FULL STRENGTH ASPIRIN, BIAXIN, ENTEX,  DECONGESTANTS, ASACOL (pancreatitis).   FAMILY HISTORY:  Mother had history of osteoporosis.  Father had spider  polyps and history of clotting disorder.  No known history of inflammatory  bowel disease, colorectal carcinoma, liver or chronic GI problems.   SOCIAL HISTORY:  [name] is married.  She has three children.  She is a  Optometrist.  She denies any alcohol use.  She does smoke less than  one pack a day.   REVIEW OF SYSTEMS:  CONSTITUTIONAL:  Weight is stable.  See HPI.  CARDIOVASCULAR:  See HPI.  PULMONARY:  See HPI.  GASTROINTESTINAL:  See HPI.  She denies any dysphagia or odynophagia.  She denies any melena.  GU:  Denies any dysuria, hematuria or increased urinary frequency.   PHYSICAL EXAMINATION:  VITAL SIGNS:  Weight 167.2 pounds, height 64 inches,  temperature 99.3, pulse 84, respirations 18, blood pressure 124/70.  GENERAL:  [name] is a 60 year old, Caucasian female who is alert,  oriented,  pleasant and cooperative in no acute distress.  HEENT:  Sclerae clear, nonicteric.  Conjunctivae pink.  Oropharynx pink and  moist without any lesions.  NECK:  Supple without any masses or thyromegaly.  CHEST:  Heart regular rate and rhythm with normal S1, S2 without any  murmurs, rubs or gallops.  LUNGS:  Clear to auscultation bilaterally.  Left clear, right base with  coarse crackles.  ABDOMEN:  Positive bowel sounds x4.  No bruits auscultated.  Soft,  nontender, nondistended without palpable bladder or hepatosplenomegaly.  No  rebound tenderness or guarding.  EXTREMITIES:  Without clubbing or edema bilaterally.   LABORATORY DATA AND X-RAY FINDINGS:  WBC 15.2, hemoglobin 9.4, hematocrit  28.8, platelets 273.  D-dimer positive at 0.75.  Lipase 38, calcium 8.  Sodium 134, potassium 3.8, chloride 101, CO2 27, BUN 4, creatinine 0.7,  glucose 237.   IMPRESSION:  [name] is a 60 year old, Caucasian female recently  admitted 2 days ago with chest pain, cough, fever, chills and felt to have  upper respiratory infection.  She has had cardiology workup which has been  negative.  She does have a positive D-dimer and will have workup per  hospitalist.  She was recently diagnosed with left-sided ulcerative colitis  via colonoscopy on March 17.  She was started on Asacol and developed acute  pancreatitis felt to be drug-induced.  She is scheduled for EUS at [facility]  on January 06, 2006.  She has been on prednisone 40 mg daily as well as daily  hydrocortisone enemas.  She continues to have bloody diarrhea.  She  generally has about 10 stools a day.  Today, she has had only two.  She  feels her ulcerative colitis symptoms are getting somewhat better.  She is  has been on recent antibiotics and I feel that we should rule out  Clostridium difficile colitis.  She is going to need chronic maintenance  therapy to gain remission of her ulcerative colitis.  She is mildly anemic with a hemoglobin of 9.4  and a hematocrit of 28.8.  She had hyponatremia and  hypokalemia on admission which has been corrected.   RECOMMENDATIONS:  1.  Will check C. difficile.  2.  Start Colazal 750 mg three p.o. b.i.d.  3.  Continue prednisone 40 mg daily.  Will begin to taper  soon.  4.  Continue hydrocortisone enemas for now.  5.  Followup EUS on January 06, 2006, at [facility].  6.  Further conditions to follow.   I would like to thank Dr. Jones Bales for allowing [place] to take part in the care of  this patient.      Les Pou, N.P.      Bridgette Habermann, M.D.  Electronically Signed    KC/MEDQ  D:  12/16/2005  T:  12/16/2005  Job:  [id number]

## 2011-02-14 NOTE — Op Note (Signed)
NAME:  [name], [name]             ACCOUNT NO.:  [id number]   MEDICAL RECORD NO.:  [id number]          PATIENT TYPE:  AMB   LOCATION:  DAY                           FACILITY:  [facility]   PHYSICIAN:  R. Garfield Cornea, M.D. DATE OF BIRTH:  [birth year]   DATE OF PROCEDURE:  07/02/2005  DATE OF DISCHARGE:                                 OPERATIVE REPORT   PROCEDURE:  Esophagogastroduodenoscopy with [name] dilation.   INDICATIONS FOR PROCEDURE:  A 60 year old lady with chronic esophageal  dysphagia. Reflux symptoms have been well controlled on Prevacid more  recently. She is taking Fosamax. Her abdominal cramps have essentially  resolved more recently with a course of Librax. 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 EGD  is now being done to further evaluate dysphagia. This approach has been  discussed with the patient at length. Potential risks, benefits, and  alternatives have been reviewed and questions answered. She is agreeable.  Please see documentation in the medical record.   PROCEDURE NOTE:  O2 saturation, blood pressure, pulse, and respirations were  monitored throughout the entire procedure. Conscious sedation with Versed 4  mg IV and Demerol 100 mg IV in divided doses.   INSTRUMENT:  Olympus video chip system.   FINDINGS:  Examination of the tubular esophagus revealed prominent  Schatzki's ring. There was critical narrowing of the esophageal aperture at  the EG junction. In fact, it held up passage of the scope. With mild  pressure, I was able to traverse it with a scope as it was partially  traumatically dilated. Esophageal mucosa otherwise appeared normal.   Stomach:  Gastric cavity was empty and insufflated well with air. Thorough  examination of gastric mucosa including retroflexed view of the proximal  stomach and esophagogastric junction demonstrated moderate sized hiatal  hernia. Pylorus patent and easily  traversed. Examination of bulb and second  portion revealed no abnormalities.   THERAPEUTIC/DIAGNOSTIC MANEUVERS:  A 54-French Maloney dilator was passed to  full insertion. A look back revealed the ring had been additionally dilated  without apparent complication. The patient tolerated the procedure well and  was reactive to endoscopy.   IMPRESSION:  1.  Critical Schatzki's ring. Otherwise normal appearing esophagus status      post dilation and disruption as described above.  2.  Moderate sized hiatal hernia. Otherwise normal stomach and D1 and D2.   Today's maneuver will likely improve her dysphagia considerably. She is to  continue to chew her food thoroughly and have adequate fluids on hand to  assist with the swallowing process.   She has to pay particular attention and adhere to the precautions with  continued Fosamax use.   Further recommendations to continue Prevacid 30 mg orally daily. Will plan  to see this nice lady back in the office for recheck in six months.      Bridgette Habermann, M.D.  Electronically Signed     RMR/MEDQ  D:  07/02/2005  T:  07/02/2005  Job:  [id number]   cc:   Rory Percy  Fax: [phone]

## 2011-02-14 NOTE — H&P (Signed)
NAME:  [name], [name]             ACCOUNT NO.:  [id number]   MEDICAL RECORD NO.:  [id number]          PATIENT TYPE:  INP   LOCATION:  A225                          FACILITY:  [facility]   PHYSICIAN:  Toby L. Fugate, D.O.   DATE OF BIRTH:  November 15, 1950   DATE OF ADMISSION:  12/13/2005  DATE OF DISCHARGE:  LH                                HISTORY & PHYSICAL   PRIMARY CARE PHYSICIAN:  [facility].   REASON FOR VISIT:  Chest pain and fever and chills.   HISTORY OF PRESENT ILLNESS:  [name] is a 60 year old, Caucasian female  with a past medical history of ulcerative colitis, acute pancreatitis and  abnormal EKG.  She was recently hospitalized at the beginning of March due  to acute pancreatitis.  During that hospitalization, she was also noted to  have an abnormal EKG.  She underwent echocardiogram which showed no wall  motion abnormality.  The plan was for her to undergo outpatient stress test.  In terms of her ulcerative colitis, the patient has been on rectal steroid  enemas.  She also receives oral steroids.  She was previously on Asacol,  however, that was felt to be contributing to the patient's pancreatitis.  She says that a day or so ago, she began to develop low-grade fevers and  chills.  Gradually, she began to feel generalized chills.  When she  developed chest pain today, she decided to come to the emergency room.  Here  in the ED, her chest pain was relieved with nitroglycerin.  She does  continue to have the same EKG changes that were noted previously.  They may  be somewhat more pronounced, however.  Her troponin is negative.  She does  have a white count of 12.1.  The patient is currently chest pain free.  She  does continue to feel febrile.   PAST MEDICAL HISTORY:  1.  Ulcerative colitis, recent diagnosis.  2.  Schatzki's ring of the esophagus, status post dilatation.  3.  Depression.  4.  Abnormal EKG.  5.  Depression.  6.  Seasonal allergies.  7.   Hypercholesterolemia.  8.  Anxiety.   ALLERGIES:  ASPIRIN.  CODEINE.  ENTEX.  PENICILLIN.   REVIEW OF SYSTEMS:  A complete 12-point review of systems and the review was  negative except for that stated in HPI.   FAMILY HISTORY:  Parents were healthy.   SOCIAL HISTORY:  She is married.  She does not smoke.  She denies any  alcohol or illicit drug use.  She is independent with her activities of  daily living.  She has been married x26 years and has three children.  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   PHYSICAL EXAMINATION:  VITAL SIGNS:  Temperature 100.1, blood pressure  135/83, pulse 85, respirations 20.  HEENT:  Pupils equally round and reactive to light.  Extraocular movements  intact.  No scleral icterus.  Oropharynx clear and moist.  No erythema or  thrush.  NECK:  No JVD.  No carotid bruit or adenopathy.  HEART:  Regular  rate and rhythm, no murmurs, rubs or gallops.  LUNGS:  Clear to auscultation bilaterally.  No wheezes, rales or rhonchi.  ABDOMEN:  There is minimal epigastric tenderness, otherwise benign.  EXTREMITIES:  No clubbing, cyanosis or edema.  NEUROLOGIC:  Cranial nerves 2-12 grossly intact.  No focal deficits.  DTRs  2/4 in all extremities.  Strength 5/5 in all extremities.   LABORATORY DATA AND X-RAY FINDINGS:  White blood cell count 12.1, hemoglobin  10.5, hematocrit 32.7, platelets 326.  Sodium 132, potassium 2.7, chloride  97, CO2 28, glucose 358, BUN 6, creatinine 0.8, calcium 8.6.  Troponin  negative.  UA remarkable for glucose and trace hemoglobin.   EKG showed inverted T waves in V1, V2, V3, V4, V5 and V6.  This is  essentially unchanged from previous EKG.  Chest x-ray is unremarkable.   ASSESSMENT/PLAN:  1.  Fever and chills.  The etiology of this is not clear.  This could be      related to the patient's ulcerative colitis versus possible other      infectious etiologies.  At this point, if there is no focus of      infection, I  will hold antibiotics.  I will admit the patient to a      telemetry bed.  I will pan culture the patient.  Chest x-ray was      unrevealing.  In the morning, I will consult gastroenterology for input.      I will continue the patient on her treatments for ulcerative colitis.  2.  Chest pain with electrocardiogram changes that are old.  I did speak      with cardiology (Dr. Haroldine Laws).  The patient's electrocardiogram      changes appear to be old, however, they are a bit more pronounced.  This      could be due to repolarization.  I will monitor cardiac enzymes      throughout the night.  If need be, Dr. Haroldine Laws stated that I should      call him back if there is any change in the patient's status.      Otherwise, he requests that we right an order for consultation of      cardiology on Monday morning and they will be happy to see the patient.      The previous plan for the patient was an outpatient stress test.  This      will be arranged to be done on Monday as an inpatient.  3.  Ulcerative colitis.  I will continue the patient on her oral steroids      and her steroid enemas.  I will consult gastroenterology tomorrow.  4.  Hyponatremia.  I will provide intravenous fluids.  5.  Hypokalemia.  KCl will be added to the intravenous fluid.  6.  Hyperglycemia.  This is likely related to the patient's steroid intake.      I will provide sliding scale insulin and check Accu-Cheks.  7.  Depression.  I will continue the patient on Wellbutrin and Lexapro.  8.  Gastroesophageal reflux disease.  I will provide Protonix.  9.  Hypercholesterolemia.  I will continue the patient on Zetia.  10. Anxiety.  The patient will receive Trazodone.  11. This patient is a full code.      Toby L. Fugate, D.O.  Electronically Signed     TLF/MEDQ  D:  12/13/2005  T:  12/15/2005  Job:  [id number]

## 2011-02-14 NOTE — Discharge Summary (Signed)
[name], [name]             ACCOUNT NO.:  [id number]   MEDICAL RECORD NO.:  [id number]          PATIENT TYPE:  INP   LOCATION:  A225                          FACILITY:  [facility]   PHYSICIAN:  Paula Haff, MD     DATE OF BIRTH:  [birth year]   DATE OF ADMISSION:  12/13/2005  DATE OF DISCHARGE:  03/23/2007LH                                 DISCHARGE SUMMARY   PRIMARY DOCTOR:  [name].   GASTROENTEROLOGIST:  [name], M.D.   DISCHARGE DIAGNOSES:  1.  Ulcerative colitis, somewhat improved.  2.  Chest pain, requiring outpatient stress test.  3.  Negative computed tomography scan for pulmonary embolism.  4.  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  5.  History of chronic obstructive pulmonary disease.   BRIEF HOSPITALIZATION COURSE:  1.  Ulcerative colitis.  Paula patient is a 60 year old Caucasian female with      history of ulcerative colitis which has been recently diagnosed.  Paula      patient presented to Paula hospital primarily because she was having chest      pains.  She was also having fever and chills Paula home.  However, she was      found to have significant symptoms secondary to her ulcerative colitis.      She was having 9-10 bowel movements a day with blood mixed in them.  Paula      patient was already on steroids for this as prescribed by her      gastroenterologist.  We consulted [name] and [name] again. They      started Paula patient on Colazal.  She was already getting hydrocortisone      enemas.  Her prednisone was tapered down.  Paula patient has experienced      some improvement in her symptoms.  She continues to have about 7-8 BMs      every day.  However, Paula amount of blood in her stool has reduced      significantly.  Paula patient's pain also has subsided.  She was put on      Bentyl.  Prednisone taper has been recommended by [name] which has      been communicated to Paula patient as well.  She will need to see her    gastroenterologist in about 2 weeks' time.  2.  Chest pains, as mentioned above.  Paula patient was admitted to Paula      hospital, was found to have an abnormal EKG, though these were noted in      Paula previous EKG as well.  Her pain was atypical for cardiac etiology.      She was seen by cardiologists from [name], who felt that Paula      EKG changes were actually present in her old EKG also.  They recommended      an outpatient stress test when she was stable from a GI standpoint.      Hence, we will be making her an appointment with [name] in      about 4-6 weeks'  time.  She will benefit from 81 mg of aspirin every      day.  Since Paula pain was atypical, we also obtained a CT chest which was      negative for PE.  3.  New onset diabetes.  Paula patient was found to have high blood sugars      during this admission.  This was likely secondary to her use of steroids      for her ulcerative colitis.  We started Paula patient on Glucotrol.  We      are adjusting her dosage.  We are also starting her on metformin today.      She will need to check her blood sugars Paula least three times daily.  I      will ask Paula patient to see her PMD next week to see if she requires      further adjustments to her antidiabetic regimen.  Her HbA1c was 6.8.  4.  Anemia.  Paula patient was found to have a low hemoglobin of between 9-10.      MCV was normal.  Iron profile was checked, which suggested iron      deficiency anemia, for which she was started on iron sulfate.      Hopefully, with better control of her ulcerative colitis, her anemia      should also improve.  She had not required any blood transfusions for      this during this admission.  5.  Otherwise, her other medical issues are all stable.   DISCHARGE MEDICATIONS:  1.  Ferrous sulfate 325 mg p.o. b.i.d.  2.  Bentyl 10 mg p.o. t.i.d.  3.  Colazal 750 mg 3 tablets orally twice daily.  4.  Prednisone currently 30 mg for 3 more  days, and then decrease dose by 5      mg every week until seen by [name].  5.  Aspirin 81 mg p.o. daily.  6.  Glucotrol 5 mg p.o. b.i.d.  7.  Metformin 500  mg p.o. b.i.d.  8.  Paula patient will continue her other outpatient medications as before,      with no changes made.   FOLLOWUP:  1.  With [name] in 2 weeks.  2.  With PMD in 1 week.  3.  Other followup required will be for a low-attenuation lesion seen in Paula      right lobe of her thyroid gland for which she will require an ultrasound      which will be arranged for this patient.  4.  Followup also to be set up with [name] in 4-6 weeks.   OTHER INSTRUCTIONS:  Paula patient has been asked to check her blood sugars 3  times daily before her meals.  She has been asked to follow up with her PMD  early next week to see if she requires further adjustments to her diabetic  regimen.   DIET:  Paula patient will have a heart-healthy diet.   PHYSICAL ACTIVITY:  No restrictions.   Paula patient has been told that she should stay off work for Paula least 2 weeks  until she is seen by [name].   CONSULTATIONS OBTAINED:  1.  [name], M.D.  2.  [name], M.D.  3.  [name].      Paula Haff, MD  Electronically Signed     GK/MEDQ  D:  12/19/2005  T:  12/19/2005  Job:  [id number]  cc:   [name], M.D.  Fax: [phone]   [name], M.D.  [address]  [place]  [place] [address]

## 2011-02-14 NOTE — Group Therapy Note (Signed)
[name], [name]             ACCOUNT NO.:  [id number]   MEDICAL RECORD NO.:  [id number]          PATIENT TYPE:  INP   LOCATION:  A225                          FACILITY:  [facility]   PHYSICIAN:  Audria Nine, M.D.DATE OF BIRTH:  1951-07-03   DATE OF PROCEDURE:  12/14/2005  DATE OF DISCHARGE:                                   PROGRESS NOTE   SUBJECTIVE:  The patient feels better.  She says  it is mostly her abdomen  that hurts now.  She denies any nausea or vomiting and actually feels  hungry.  She has no fever or chills.   OBJECTIVE:  GENERAL:  Conscious and comfortable.  Not in acute distress.  VITAL SIGNS:  Blood pressure 126/69, pulse 84, respirations 20, T-max 98.9.  Blood sugars have ranged between 75 to 381.  Oxygen saturation 94% on room  air.  HEENT:  Normocephalic, atraumatic.  Oral mucosa moist.  No exudate noted.  NECK:  Supple.  No JVD.  No lymphadenopathy.  LUNGS:  Clear clinically.  Good air entry bilaterally.  HEART:  S1 and S2 regular, no S3.  No gallops or rubs.  ABDOMEN:  Slightly tender.  Not distended.  Bowel sounds positive.  Some  mild tenderness which was generalized.  No guarding or rigidity.  EXTREMITIES:  No pitting edema was noted.  CNS:  Grossly intact with no focal deficits.   LABORATORY/DIAGNOSTIC DATA:  White blood cell count 11.1, hemoglobin 9.7,  hematocrit 29.4, platelet count 289, neutrophils count 58.  Sodium 138,  potassium 3.9, chloride 103, CO2 29, BUN 4, creatinine 0.7, glucose 173.  Cardiac enzymes were negative.  Urinalysis shows large blood.  Protein was  trace, moderate leukocytes, a few bacteria.   ASSESSMENT/PLAN:  [name] is a 60 year old Caucasian female with a  history of ulcerative colitis which was recently diagnosed.  The patient  presents again with abdominal pain and some chest pain.  She was recently in  the hospital back on December 07, 2005.   The patient feels pain is better controlled now.  She is currently on  prednisone 40 mg p.o. once a day and hydrocortisone rectal enemas.  I will  request GI see her for their evaluation.  Cardiac enzymes are negative.  I  suspect that herpain is from ulcerative colitis than anything else.    Her potassium is now normal.  The patient did not have fevers or chills.   Her pancreatic enzymes are slightly elevated.  Will continue to monitor  this.  The last time she was here, she had acute pancreatitis which we  thought was due to Asacol.   DISPOSITION:  I await GI consult.  Continue on prednisone or systemic  prednisone and also hydrocortisone enemas.  The patient ruled out for  myocardial infarction, hemodynamically stable.  No overt evidence of  infection.  I agreed to hold antibiotics for now.      Audria Nine, M.D.  Electronically Signed     AM/MEDQ  D:  12/14/2005  T:  12/15/2005  Job:  [id number]

## 2011-02-14 NOTE — Op Note (Signed)
NAME:  [name], [name]             ACCOUNT NO.:  [id number]   MEDICAL RECORD NO.:  [id number]          PATIENT TYPE:  AMB   LOCATION:  DAY                           FACILITY:  [facility]   PHYSICIAN:  R. Garfield Cornea, M.D. DATE OF BIRTH:  Aug 30, 1951   DATE OF PROCEDURE:  12/03/2005  DATE OF DISCHARGE:                                 OPERATIVE REPORT   PROCEDURE:  Colonoscopy with ileoscopy, segmental biopsy, stool collection.   INDICATIONS FOR PROCEDURE:  The patient is a 60 year old lady with a one  history of abdominal cramps, diarrhea, intermittent blood per rectum much  worse over the past several weeks. She had a colonoscopy over at [facility]  back in June of 2006 by Dr. Anthony Sar, and reportedly was only found to have  diverticulosis (per patient report). Colonoscopy is now being done to  further evaluate bloody diarrhea. This approach has been discussed with the  patient at length. Potential risks, benefits, and alternatives have been  reviewed and questions answered. She is agreeable. Please see documentation  in the medical record.   PROCEDURE NOTE:  O2 saturation, blood pressure, pulse, and respirations were  monitored throughout the entire procedure. Conscious sedation with Versed 6  mg IV and Demerol 125 mg IV in divided doses.   INSTRUMENT:  Olympus video chip system.   FINDINGS:  Digital rectal exam revealed no abnormalities.   ENDOSCOPIC FINDINGS:  Prep was adequate.   Rectum:  Examination of the rectal mucosa revealed markedly inflamed  anorectal mucosa with granularity, diffuse erosions and complete loss of  vascular pattern. Please see photographs. Retroflexed view of the distal  rectum demonstrated only some minimal hemorrhoids.   Colon:  Colonic mucosa was surveyed from the rectosigmoid junction up into  the mid sigmoid. There was a quite bit of edema, geographic ulcerations,  erosions and again loss of the normal vascular pattern. There was an acute  turn which I  could not negotiate initially with the adult scope. I pulled  out and got the pediatric scope and was able to get around without any  difficulty and subsequently advanced the scope all the way to the cecum.  Cecum, ileocecal valve and appendiceal orifice were well seen and  photographed for the record. Also intubated the terminal ileum to 10 cm.  Olympus videoscope was slowly withdrawn. All previously mentioned mucosa  surfaces were again seen. The patient had marked inflammatory changes  consistent with exuberant colitis extending to the mid descending colon.  More proximal to the cecum, the colonic mucosa had a granular appearance.  There were number left-sided diverticula. There was no evidence of polyp or  neoplasm. Terminal ileal mucosa appeared normal. Segmental biopsies of the  right, transverse, sigmoid and rectal mucosa were taken for histological  study. Also, stool studies were taken for microbiological studies. The  patient tolerated the procedure well and was reactive to endoscopy.   IMPRESSION:  Marked inflammatory changes of the rectum and left colon. Less  striking abnormalities all the way to the cecum consistent with pan  proctocolitis, left sided greater than right. Internal hemorrhoids and  sigmoid diverticula.  Normal terminal ileum. Segmental biopsies. Stool  sample.   The patient most likely has idiopathic inflammatory bowel disease  (ulcerative colitis).   RECOMMENDATIONS:  1.  Follow up on stool studies. Begin Asacol 2 tablets orally t.i.d.  2.  Two-week course of hydrocortisone 100-mg enema 1 per rectum at bedtime.      Begin prednisone 20 mg orally daily.  3.  We will plan to see this nice lady back in one month. Follow up on stool      studies and biopsies taken today.      Bridgette Habermann, M.D.  Electronically Signed     RMR/MEDQ  D:  12/03/2005  T:  12/04/2005  Job:  [id number]   cc:   Gar Ponto  Fax: [phone]

## 2011-02-14 NOTE — Group Therapy Note (Signed)
NAME:  [name], [name]             ACCOUNT NO.:  [id number]   MEDICAL RECORD NO.:  [id number]          PATIENT TYPE:  INP   LOCATION:  A202                          FACILITY:  [facility]   PHYSICIAN:  Audria Nine, M.D.DATE OF BIRTH:  [birth year]   DATE OF PROCEDURE:  12/07/2005  DATE OF DISCHARGE:                                   PROGRESS NOTE   SUBJECTIVE:  The patient feels a lot better today, actually hungry and  requesting for me to advance her diet.  She has very minimal abdominal  pains, no nausea or vomiting.  She has good bowel movements too.  She denies  any fever and chills.   OBJECTIVE:  GENERAL:  Conscious, alert.  The patient looks a lot better  today.  VITAL SIGNS:  She reports pain as less than 1/10.  Her blood pressure was  100/55, pulse of 95, respiratory rate is 20, temperature 98.9.  Oxygen  saturation is 96% on room air.  HEENT:  Normocephalic, atraumatic.  Oral mucosa moist.  No exudates.  NECK:  Supple, no JVD, no lymphadenopathy.  LUNGS:  Clear clinically, good air entry bilaterally.  CARDIAC:  S1, S2 regular, no S3, S4, gallops or rubs.  ABDOMEN:  Soft, nontender, bowel sounds positive.  No masses palpable.  EXTREMITIES:  No edema.   Laboratory data:  White blood cell count 7.9, hemoglobin of 10.3, hematocrit  30.9, platelet count 317.  Neutrophils were 86%.  Sodium was 144, potassium  5.6, chloride of 112, CO2 27, glucose 189, BUN of 2, creatinine 0.8, calcium  8.6.  Lipase was normal at 35.   ASSESSMENT AND PLAN:  [name] is a 60 year old Caucasian female admitted  with acute pancreatitis.  The patient is improved with marked reduction in  abdominal pain.  The patient is also hungry and requesting to advance her  diet.  Dr. Gala Romney saw her yesterday and put her on hydrocortisone enemas,  Asacol remains on hold as a possible suspect of the etiology of her acute  pancreatitis.  The patient is doing fairly well and remains well-hydrated.  She does have  some hyperkalemia.  I will hold additional potassium  supplements and also the potassium in the IV fluids.  I will switch her IV  to just normal saline with no potassium in it.  Will follow with a CBC and a  BMP in the morning.  I will check the potassium at 3 p.m. today.  If it  still remains elevated, will give Kayexalate.   She did have an episode of slight hypotension.  The nurse feels it was  likely to Dilaudid.  The BP has improved over time.   DISPOSITION:  Will discuss with GI, but I anticipate that she may be  discharged home tomorrow.      Audria Nine, M.D.  Electronically Signed     AM/MEDQ  D:  12/07/2005  T:  12/07/2005  Job:  [id number]

## 2011-02-14 NOTE — Procedures (Signed)
[name], [name]             ACCOUNT NO.:  [id number]   MEDICAL RECORD NO.:  [id number]          PATIENT TYPE:  INP   LOCATION:  A202                          FACILITY:  [facility]   PHYSICIAN:  Jacqulyn Ducking, M.D. [facility] OF BIRTH:  Dec 19, 1950   DATE OF PROCEDURE:  12/08/2005  DATE OF DISCHARGE:                                  ECHOCARDIOGRAM   REFERRING:  Drs. Medaiyese and Dechurch   CLINICAL DATA:  A 60 year old woman with EKG abnormalities and atrial  arrhythmia.   M-MODE:  Aorta 3.3, left atrium 4.8, septum 1.1, posterior wall 1.1, LV  diastole 4.3, LV systole 3.5.   1.  Technically adequate echocardiographic study.  2.  Mild left atrial enlargement; normal right atrium and right ventricle.  3.  Normal aortic valve; mild calcification of the proximal ascending aorta.  4.  Normal mitral valve; trivial regurgitation.  5.  Normal tricuspid and pulmonic valve; normal proximal pulmonary artery.  6.  Normal left ventricular size and wall thickness; normal regional and      global left ventricular systolic function.  7.  Normal inferior vena cava.      Jacqulyn Ducking, M.D. [facility]  Electronically Signed     RR/MEDQ  D:  12/08/2005  T:  12/09/2005  Job:  [id number]

## 2011-02-14 NOTE — H&P (Signed)
NAMEMICHALINA, Bean             ACCOUNT NO.:  [id number]   MEDICAL RECORD NO.:  [id number]          PATIENT TYPE:  INP   LOCATION:  A202                          FACILITY:  [facility]   PHYSICIAN:  Audria Nine, M.D.DATE OF BIRTH:  04/10/1951   DATE OF ADMISSION:  12/06/2005  DATE OF DISCHARGE:  LH                                HISTORY & PHYSICAL   ADMISSION DIAGNOSES:  1.  Acute pancreatitis.  2.  Acute abdominal pain.  3.  Hypokalemia.  4.  History of ulcerative colitis, which was a recent diagnosis.   Primary care physician:  Patient is unassigned.   CHIEF COMPLAINT:  Abdominal pain.   HISTORY OF PRESENT ILLNESS:  [name] is a 60 year old Caucasian female  who presented to the emergency room with a three-day history of abdominal  pain.  Patient reports some diffuse abdominal pain, mostly in her left  flank, radiating to her back.  Says the pain is sharp to an occasional dull  ache, intermittent.  There are no specific relieving or aggravating factors  and pain lasts for 45 minutes at a time.  She describes it as 10/10 when it  does occur.   Patient was recently evaluated by Gastroenterology, Dr. Gala Romney, who performed  an EGD with a Maloney dilatation.  Patient was also found to have ulcerative  colitis.   Evaluation in the emergency room with a CT scan showed no evidence of  hydronephrosis or nephrolitihasis but patient was noted to have possible  pancreatitis.   Patient is being admitted now for further evaluation and management.  Lipase  was slightly elevated.   She denies any fever or chills.  No rigors.  She did have some nausea but no  vomiting.  She denies any diarrheal episodes or constipation.   She has no headaches, dizziness or lightheadedness.  No frequency, urgency  or dysuria.   REVIEW OF SYSTEMS:  A 10-point review of systems is otherwise negative,  except as mentioned in the history of present illness.   PAST MEDICAL HISTORY:  1.  Ulcerative  colitis, which was a recent diagnosis.  2.  Schatzki's ring of the esophagus, status post dilatation.  3.  Recent diagnosis of ulcerative colitis from a colonoscopy on December 03, 2005.  4.  Depression.  5.  She is status post cholecystectomy.   MEDICATIONS:  1.  Asacol two tablets orally t.i.d.  2.  Two-week course of hydrocortisone 100-mg enema per rectum at bedtime.  3.  Prednisone 20 mg p.o. once a day.  4.  Wellbutrin 150 mg p.o. once a day.  5.  Lexapro 10 mg p.o. once a day.  6.  Prevacid 30 mg p.o. once a day.  7.  Zetia 10 mg p.o. once a day.  8.  Trazodone 50 mg p.o. at bedtime.  9.  Citrucel Fiber as needed.  10. Fosamax 70 mg p.o. weekly.  11. Calcium with vitamin D 600 mg p.o. b.i.d.  12. Advair 250/50 one puff b.i.d.  13. Albuterol MDI two puffs q.i.d. p.r.n.   ALLERGIES:  PENICILLIN, CODEINE SULFATE, ASPIRIN,  ENTEX and CEFZIL.   FAMILY HISTORY:  Unremarkable.   SOCIAL HISTORY:  Patient is married.  Does not smoke.  Denies any alcohol or  illicit drug use.  She is independent in activities of daily living.  She  has been married for 26 years and she has three children.  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   PHYSICAL EXAMINATION:  GENERAL:  Conscious, alert, comfortable, in mild pain  distress but remained pleasant.  VITAL SIGNS:  On arrival, blood pressure 144/93, temperature 97, pulse of  86, respiratory rate of 20.  Pain was 10/10.  Oxygen saturation 99% on room  air.  HEENT:  Normocephalic, atraumatic.  Oral mucosa was moist.  No exudates.  NECK:  Supple.  No JVD or lymphadenopathy.  LUNGS:  Clear clinically with good air entry bilaterally.  HEART:  S1, S2, regular.  No S3 or S4, gallops or rubs.  ABDOMEN:  Soft, nontender.  Bowel sounds are positive.  No masses palpable.  EXTREMITIES:  No edema.  CENTRAL NERVOUS SYSTEM:  Exam was grossly intact with no focal deficits.   LABORATORY/DIAGNOSTIC DATA:  CT scan  of the abdomen and pelvis shows  pancreatic inflammation.  No other acute pathology was noted.  White blood  cell count was 7.3, hemoglobin of 8.9, hematocrit 27.5, platelet count was  299.  No left shift.  Sodium 143, potassium of 4, chloride of 110, CO2 29,  glucose 118, BUN of 2, creatinine 0.8, bilirubin 0.2, AST is 11, ALT is 10,  total protein 4.9, alkaline phosphatase 87, calcium was 8.  Her lipase was  145.  Urinalysis was negative.   ASSESSMENT AND PLAN:  [name] is a 60 year old Caucasian female  who presented to the emergency room with complaints of abdominal pain and  nausea.  Evaluation with labs and CT scan revealed acute pancreatitis, even  though her abdominal exam was really unremarkable.  Our plan is to admit her  at this time.  Will control her pain with Dilaudid.  Will put her on some  clear liquids and will attempt to advance her diet as tolerated.  Will get  GI consult, Dr. Gala Romney, to see her.  Will resume all her home medications at  this time but I will hold Asacol pending further evaluation by Dr. Gala Romney.   Will replace her potassium.  DVT prophylaxis with Lovenox, GI prophylaxis  with Prevacid.   There is a possibility of Asacol causing her pancreatitis.  However, will  defer any further action regarding therapy for ulcerative colitis to Dr.  Gala Romney.   I have discussed the above plan with the patient and she verbalized full  understanding.  It is unclear if patient has already started taking the  Asacol, as she did not list it on her active medications.  This will need to  be clarified.      Audria Nine, M.D.  Electronically Signed     AM/MEDQ  D:  12/07/2005  T:  12/07/2005  Job:  [id number]

## 2011-02-14 NOTE — H&P (Signed)
NAME:  [name], [name]             ACCOUNT NO.:  [id number]   MEDICAL RECORD NO.:  [id number]          PATIENT TYPE:  AMB   LOCATION:                                FACILITY:  [facility]   PHYSICIAN:  R. Garfield Cornea, M.D. DATE OF BIRTH:  [birth year]   DATE OF ADMISSION:  DATE OF DISCHARGE:  LH                                HISTORY & PHYSICAL   CHIEF COMPLAINT:  Gastroesophageal reflux disease symptoms and esophageal  dysphagia.   The patient is a pleasant 60 year old lady with long-standing history of  gastroesophageal reflux disease. She has heartburn three to four times  weekly in spite of taking Prevacid 30 mg orally daily. She tells me she has  dysphagia to solid food for almost 20 years and may have been seriously  worsened recently. She does adhere to the recommended precautions for taking  her weekly dose of Fosamax. She was seen here by [place] last month for  intermittent hematochezia. 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 It worsened the first of this year  after a course of antibiotics for diverticulitis. Dr. Anthony Sar performed a  colonoscopy on March 13 which revealed only diverticula and no bleeding  lesions. Stool studies were also negative. She has been on a variety of  antispasmodics including Levbid and dicyclomine. She currently takes  dicyclomine 10 mg q.i.d. and still has three to four bowel movements daily.  Her rectal bleeding has gradually tapered off. She only has about two  episodes of low volume painless hematochezia monthly, very small volume. She  has not lost any weight. She tells me Dr. Gar Ponto performed the EGD on  her in 2003 without any significant findings. There is no family history of  IBD or colorectal neoplasia. She occasionally has a formed bowel movement  but has predominance towards diarrhea, and if she is more stressed, she has  higher stool frequency. She had her gallbladder removed in April of  this  year by Dr. Anthony Sar. She was said to have symptomatic cholelithiasis.  Diarrhea has not worsened since cholecystectomy.   PAST MEDICAL HISTORY:  1.  Significant for seasonal allergies.  2.  Depression.   PAST SURGICAL HISTORY:  1.  Tonsillectomy.  2.  D&C.   CURRENT MEDICATIONS:  1.  Trazodone 100 mg at bedtime.  2.  Albuterol inhaler p.r.n.  3.  Citrucel b.i.d.  4.  Tylenol p.r.n.  5.  Wellbutrin 150 mg daily.  6.  Zetia 10 mg daily.  7.  Nabumetone 750 mg b.i.d.  8.  Fosamax 70 mg weekly.  9.  Advair 100/50 one puff b.i.d.  10. Flonase two sprays daily.  11. Zyrtec 10 mg daily.  12. Prevacid 30 mg daily.  13. Aspirin 81 mg daily.  14. Astelin 130 mcg b.i.d.  15. Mylanta p.r.n.  16. Dicyclomine 10 mg q.i.d.   ALLERGIES:  PENICILLIN, CODEINE, FULL STRENGTH ASPIRIN, BIAXIN, ENTAX  DECONGESTANTS.   FAMILY HISTORY:  Mother is alive with osteoporosis. Father had bladder  polyps and blood clots. No history of chronic GI or liver  illness.   SOCIAL HISTORY:  The patient has been married for 26 years and has 3  children. 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 She smokes a few cigarettes daily. No alcohol.   REVIEW OF SYSTEMS:  No chest pain. No dyspnea on exertion. No weight change.  Otherwise as in history of present illness.   PHYSICAL EXAMINATION:  GENERAL:  Reveals a pleasant 60 year old lady resting  comfortably.  VITAL SIGNS:  Weight 170, height 5 foot 4, temperature 98, BP 122/74, pulse  68.  SKIN:  Warm and dry. No jaundice. No continuous stigmata of chronic liver  disease.  HEENT:  No scleral icterus. JVD is not prominent.  CHEST:  Lungs are clear to auscultation.  BREASTS:  Deferred.  ABDOMEN:  Nondistended. Positive bowel sounds. She has laparoscopy sites,  well healed. Abdomen is soft and nontender without appreciable mass or  organomegaly.  EXTREMITIES:  No edema.   IMPRESSION:  The patient is a pleasant  60 year old lady with long-standing  chronic esophageal dysphagia which may have worsened recently. She really  has not had a satisfactory response as far as her reflux symptoms are  concerned with Prevacid. The fact that she is taking Fosamax also raises my  concern she certainly could have an occult ring or stricture to account for  her chronic esophageal dysphagia. This needs further investigation. I feel  that we need to change her antispasmodic regimen to get a little better  result towards normalization of her bowel function. Hematochezia seems to be  steadily tapering off.   She has never had her small bowel imaged. I feel she needs a one time  imaging study of her small bowel just to confirm that she has no evidence of  ileitis.   RECOMMENDATIONS:  1.  EGD with possible esophageal dilation in the very near future at [facility]. I have discussed the potential risks, benefits and      alternatives with the patient. She is agreeable.  2.  Stop dicyclomine. Begin Librax 1 tablet a.c. and h.s. p.r.n.      Prescription given.  3.  Small-bowel follow through prior to EGD to rule out ileitis.  4.  Further recommendations to follow.      Bridgette Habermann, M.D.  Electronically Signed     RMR/MEDQ  D:  06/19/2005  T:  06/19/2005  Job:  [id number]   cc:   Rory Percy  Fax: [phone]

## 2011-02-25 ENCOUNTER — Other Ambulatory Visit: Payer: Self-pay

## 2011-02-25 MED ORDER — BALSALAZIDE DISODIUM 750 MG PO CAPS: 2250.0000 mg | ORAL_CAPSULE | Freq: Three times a day (TID) | ORAL | Status: DC

## 2011-04-28 ENCOUNTER — Other Ambulatory Visit: Payer: Self-pay

## 2011-04-28 MED ORDER — BALSALAZIDE DISODIUM 750 MG PO CAPS: 2250.0000 mg | ORAL_CAPSULE | Freq: Three times a day (TID) | ORAL | Status: DC

## 2011-06-17 ENCOUNTER — Encounter: Payer: Self-pay | Admitting: Internal Medicine

## 2011-07-10 LAB — KOH PREP

## 2011-07-14 LAB — COMPREHENSIVE METABOLIC PANEL
ALT: 10
AST: 13
Alkaline Phosphatase: 62
Calcium: 9.1
GFR calc Af Amer: >60
Glucose, Bld: 86
Potassium: 3.6
Sodium: 139
Total Protein: 5.9 — ABNORMAL LOW

## 2011-07-14 LAB — CBC
Hemoglobin: 11.4 — ABNORMAL LOW
MCHC: 33.6
RBC: 3.74 — ABNORMAL LOW
RDW: 15.1 — ABNORMAL HIGH

## 2012-01-22 ENCOUNTER — Other Ambulatory Visit: Payer: Self-pay

## 2012-01-23 MED ORDER — BALSALAZIDE DISODIUM 750 MG PO CAPS: 2250.0000 mg | ORAL_CAPSULE | Freq: Three times a day (TID) | ORAL | Status: DC

## 2012-01-23 NOTE — Telephone Encounter (Signed)
Needs one year f/u of UC.

## 2012-01-26 NOTE — Telephone Encounter (Signed)
Pt called back and made an appointment for May 10 @ 1100

## 2012-01-26 NOTE — Telephone Encounter (Signed)
LMOM to call.

## 2012-02-05 ENCOUNTER — Encounter: Payer: Self-pay | Admitting: Internal Medicine

## 2012-02-06 ENCOUNTER — Ambulatory Visit (INDEPENDENT_AMBULATORY_CARE_PROVIDER_SITE_OTHER): Payer: BC Managed Care – PPO | Admitting: Urgent Care

## 2012-02-06 ENCOUNTER — Encounter: Payer: Self-pay | Admitting: Urgent Care

## 2012-02-06 VITALS — BP 140/71 | HR 99 | Temp 98.1°F | Ht 64.0 in | Wt 182.8 lb

## 2012-02-06 DIAGNOSIS — R1310 Dysphagia, unspecified: Secondary | ICD-10-CM

## 2012-02-06 DIAGNOSIS — K5190 Ulcerative colitis, unspecified, without complications: Secondary | ICD-10-CM

## 2012-02-06 DIAGNOSIS — K219 Gastro-esophageal reflux disease without esophagitis: Secondary | ICD-10-CM

## 2012-02-06 NOTE — Patient Instructions (Signed)
Continue Prevacid 30 mg daily Continue colazol 3 capsules 3 times per day We will request your labwork from [place] followup in one year Next colonoscopy March 2015

## 2012-02-06 NOTE — Assessment & Plan Note (Signed)
Complicated GERD with history of Schatzki's ring requiring multiple dilations. Doing well on Prevacid 30 mg daily.

## 2012-02-06 NOTE — Assessment & Plan Note (Signed)
No significant problems at this time. Continue PPI indefinitely.

## 2012-02-06 NOTE — Progress Notes (Signed)
Referring Provider: Gar Ponto, MD Primary Care Physician:  Gar Ponto, MD, MD Primary Gastroenterologist:  Dr. Gala Romney  Chief Complaint  Patient presents with  . Follow-up    UC/GERD/Dysphagia    HPI:  [name] is a 61 y.o. female here for follow up for ulcerative colitis. She says history of dysphagia secondary to Schatzki's ring status post multiple dilations. She's been doing very well. She's been taking colazal.  She has rare bouts of nonbloody diarrhea. She tells me she had recent labs done and [facility] earlier today.  She denies abdominal pain, heartburn, or indigestion. She denies any problems with dysphagia or odynophagia at this time. Her weight has remained stable. Her appetite is good. Overall she feels very well. She is concerned about a bulge in her abdomen that is afraid she may have a hernia.  Past Medical History  Diagnosis Date  . Depression   . Ulcerative colitis 2007  . GERD (gastroesophageal reflux disease)   . Pancreatitis   . COPD (chronic obstructive pulmonary disease)   . Diverticulitis   . Sleep apnea   . Schatzki's ring   . DDD (degenerative disc disease)   . Chronic back pain   . Arthritis     Past Surgical History  Procedure Date  . Tonsillectomy   . Dilation and curettage of uterus   . Hernia repair   . Cholecystectomy   . Cervical cone biopsy   . Arch and foot   . Esophagogastroduodenoscopy 01/28/2011    Rourk- Schatzki ring, s/p 54F, otherwise normal/Hiatal hernia otherwise normal stomach D1 and D2  . Colonoscopy 12/03/05    Rourk-marked inflammatory changes of the rectum, left colon, pan colitis, internal hemorrhoids, sigmoid diverticula  . Esophagogastroduodenoscopy 11/30/08    Rourk-Schatzki's ring status post dilation, hiatal hernia  . Esophagogastroduodenoscopy 07/12/2007    Dilation with 46 French, Schatzki's ring    Current Outpatient Prescriptions  Medication Sig Dispense Refill  . albuterol (PROVENTIL,VENTOLIN) 90  MCG/ACT inhaler Inhale 2 puffs into the lungs every 4 (four) hours as needed.        Marland Kitchen aspirin 81 MG tablet Take 81 mg by mouth daily.        Marland Kitchen azelastine (ASTELIN) 137 MCG/SPRAY nasal spray 2 sprays by Nasal route 2 (two) times daily. Use in each nostril as directed       . balsalazide (COLAZAL) 750 MG capsule Take 3 capsules (2,250 mg total) by mouth 3 (three) times daily.  270 capsule  2  . buPROPion (WELLBUTRIN XL) 300 MG 24 hr tablet Take 300 mg by mouth daily.        . Calcium Carbonate-Vitamin D (CALCIUM + D PO) Take 1 tablet by mouth 2 (two) times daily.        . cetirizine (ZYRTEC) 10 MG chewable tablet Chew 10 mg by mouth daily.        . cyclobenzaprine (FLEXERIL) 10 MG tablet Take 5 mg by mouth 2 (two) times daily as needed.      . DULoxetine (CYMBALTA) 60 MG capsule Take 60 mg by mouth daily.        . ferrous gluconate (FERGON) 225 (27 FE) MG tablet Take 240 mg by mouth 3 (three) times daily with meals.        . Fluticasone-Salmeterol (ADVAIR DISKUS) 100-50 MCG/DOSE AEPB Inhale 1 puff into the lungs every 12 (twelve) hours.        Marland Kitchen ketotifen (ZADITOR) 0.025 % ophthalmic solution Place 2 drops into both  eyes 2 (two) times daily.        . lansoprazole (PREVACID) 30 MG capsule Take 30 mg by mouth daily.        . metFORMIN (GLUMETZA) 500 MG (MOD) 24 hr tablet Take 1,000 mg by mouth daily with breakfast.        . methylcellulose packet Take 1 each by mouth 2 (two) times daily.        . pregabalin (LYRICA) 50 MG capsule Take 50 mg by mouth 2 (two) times daily.      . traZODone (DESYREL) 100 MG tablet Take 100 mg by mouth at bedtime.          Allergies as of 02/06/2012 - Review Complete 02/06/2012  Allergen Reaction Noted  . Aspirin    . Cefprozil    . Cefuroxime axetil  08/08/2008  . Clarithromycin  08/08/2008  . Codeine    . Entex  08/08/2008  . Mesalamine    . Penicillins  08/08/2008    Review of Systems: Gen: Denies any fever, chills, sweats, anorexia, fatigue, weakness,  malaise, weight loss, and sleep disorder CV: Denies chest pain, angina, palpitations, syncope, orthopnea, PND, peripheral edema, and claudication. Resp: Denies dyspnea at rest, dyspnea with exercise, cough, sputum, wheezing, coughing up blood, and pleurisy. GI: Denies vomiting blood, jaundice, and fecal incontinence.   Denies dysphagia or odynophagia Derm: Denies rash, itching, dry skin, hives, moles, warts, or unhealing ulcers.  Psych: Denies depression, anxiety, memory loss, suicidal ideation, hallucinations, paranoia, and confusion. Heme: Denies bruising, bleeding, and enlarged lymph nodes.  Physical Exam: BP 140/71  Pulse 99  Temp(Src) 98.1 F (36.7 C) (Temporal)  Ht 5' 4"  (1.626 m)  Wt 182 lb 12.8 oz (82.918 kg)  BMI 31.38 kg/m2 General:   Alert,  Well-developed, well-nourished, pleasant and cooperative in NAD Eyes:  Sclera clear, no icterus.   Conjunctiva pink. Mouth:  No deformity or lesions, oropharynx pink and moist. Neck:  Supple; no masses or thyromegaly. Heart:  Regular rate and rhythm; no murmurs, clicks, rubs,  or gallops. Abdomen:  + Diastasis rectus.   Normal bowel sounds.  No bruits.  Soft, non-tender and non-distended without masses, hepatosplenomegaly or hernias noted.  No guarding or rebound tenderness.   Rectal:  Deferred. Msk:  Symmetrical without gross deformities.  Pulses:  Normal pulses noted. Extremities:  No clubbing or edema. Neurologic:  Alert and oriented x4;  grossly normal neurologically. Skin:  Intact without significant lesions or rashes.

## 2012-02-06 NOTE — Assessment & Plan Note (Signed)
Doing well.  Based on current recommendations, next colonoscopy 8 years after diagnosis which would be March 2015 or sooner she has any problems.  Continue current medications Office visit in one year with Dr. Gala Romney We will request recent lab work from [facility]

## 2012-02-07 LAB — COMPREHENSIVE METABOLIC PANEL
Albumin: 4.3
Calcium: 9.5 mg/dL
Chloride: 100 mmol/L
HDL: 41 mg/dL (ref 35–70)
Hgb A1c MFr Bld: 6.2 % — AB (ref 4.0–6.0)
Potassium: 4.5 mmol/L
Sodium: 137 mmol/L (ref 137–147)
Total Protein: 6.8 g/dL

## 2012-02-09 NOTE — Progress Notes (Signed)
Faxed to PCP

## 2012-03-02 ENCOUNTER — Telehealth: Payer: Self-pay | Admitting: Urgent Care

## 2012-03-02 NOTE — Telephone Encounter (Signed)
Requested.

## 2012-03-02 NOTE — Telephone Encounter (Signed)
Please call [facility] to get most recent labs within past 6 mo Thanks

## 2012-03-03 NOTE — Progress Notes (Signed)
Labs reviewed from 02/06/12 CMP normal. Hemoglobin A1c 6.2. Triglycerides 159, otherwise normal cholesterol.

## 2012-04-15 ENCOUNTER — Other Ambulatory Visit: Payer: Self-pay | Admitting: Gastroenterology

## 2012-08-06 ENCOUNTER — Encounter: Payer: Self-pay | Admitting: Internal Medicine

## 2012-08-06 ENCOUNTER — Ambulatory Visit (INDEPENDENT_AMBULATORY_CARE_PROVIDER_SITE_OTHER): Admitting: Internal Medicine

## 2012-08-06 VITALS — BP 129/72 | HR 90 | Temp 97.4°F | Ht 64.0 in | Wt 181.8 lb

## 2012-08-06 DIAGNOSIS — R1310 Dysphagia, unspecified: Secondary | ICD-10-CM

## 2012-08-06 DIAGNOSIS — K219 Gastro-esophageal reflux disease without esophagitis: Secondary | ICD-10-CM

## 2012-08-06 NOTE — Patient Instructions (Addendum)
Schedule EGD with esophageal dilation in the near future  GERD information provided  CC: PCP

## 2012-08-06 NOTE — Progress Notes (Signed)
Primary Care Physician:  Gar Ponto, MD Primary Gastroenterologist:  Dr. Gala Romney  Pre-Procedure History & Physical: HPI:  [name] is a 61 y.o. female here for flareup of GERD and recurrent dysphagia. History of Schatzki's ring previously dilated.  Now with recurrent esophageal dysphagia the past few months. Prevacid increased to 30 mg orally twice a day. No improvement. No change in weight or other medications. No early satiety, nausea, vomiting, melena or hematochezia. Bowel function normal most of the time. Only rare episodes of diarrhea.  UC appears to be under good control on Colazol.  Past Medical History  Diagnosis Date  . Depression   . Ulcerative colitis 2007  . GERD (gastroesophageal reflux disease)   . Pancreatitis   . COPD (chronic obstructive pulmonary disease)   . Diverticulitis   . Sleep apnea   . Schatzki's ring   . DDD (degenerative disc disease)   . Chronic back pain   . Arthritis     Past Surgical History  Procedure Date  . Tonsillectomy   . Dilation and curettage of uterus   . Hernia repair   . Cholecystectomy   . Cervical cone biopsy   . Arch and foot   . Esophagogastroduodenoscopy 01/28/2011    Bueford Arp- Schatzki ring, s/p 15F, otherwise normal/Hiatal hernia otherwise normal stomach D1 and D2  . Colonoscopy 12/03/05    Marthe Dant-marked inflammatory changes of the rectum, left colon, pan colitis, internal hemorrhoids, sigmoid diverticula  . Esophagogastroduodenoscopy 11/30/08    Aneesah Hernan-Schatzki's ring status post dilation, hiatal hernia  . Esophagogastroduodenoscopy 07/12/2007    Dilation with 41 French, Schatzki's ring    Prior to Admission medications   Medication Sig Start Date End Date Taking? Authorizing Provider  albuterol (PROVENTIL,VENTOLIN) 90 MCG/ACT inhaler Inhale 2 puffs into the lungs every 4 (four) hours as needed.     Yes Historical Provider, MD  aspirin 81 MG tablet Take 81 mg by mouth daily.     Yes Historical Provider, MD  azelastine  (ASTELIN) 137 MCG/SPRAY nasal spray 2 sprays by Nasal route 2 (two) times daily. Use in each nostril as directed    Yes Historical Provider, MD  balsalazide (COLAZAL) 750 MG capsule TAKE 3 CAPSULES BY MOUTH 3 TIMES A DAY 04/15/12  Yes Andria Meuse, NP  buPROPion (WELLBUTRIN XL) 300 MG 24 hr tablet Take 300 mg by mouth daily.     Yes Historical Provider, MD  Calcium Carbonate-Vitamin D (CALCIUM + D PO) Take 1 tablet by mouth 2 (two) times daily.     Yes Historical Provider, MD  cetirizine (ZYRTEC) 10 MG chewable tablet Chew 10 mg by mouth daily.     Yes Historical Provider, MD  DULoxetine (CYMBALTA) 60 MG capsule Take 60 mg by mouth daily.     Yes Historical Provider, MD  ferrous gluconate (FERGON) 225 (27 FE) MG tablet Take 240 mg by mouth 3 (three) times daily with meals.     Yes Historical Provider, MD  Fluticasone-Salmeterol (ADVAIR DISKUS) 100-50 MCG/DOSE AEPB Inhale 1 puff into the lungs every 12 (twelve) hours.     Yes Historical Provider, MD  ketotifen (ZADITOR) 0.025 % ophthalmic solution Place 2 drops into both eyes 2 (two) times daily.     Yes Historical Provider, MD  lansoprazole (PREVACID) 30 MG capsule Take 30 mg by mouth 2 (two) times daily.    Yes Historical Provider, MD  metFORMIN (GLUMETZA) 500 MG (MOD) 24 hr tablet Take 1,000 mg by mouth daily with breakfast.  Yes Historical Provider, MD  methylcellulose packet Take 1 each by mouth 2 (two) times daily.     Yes Historical Provider, MD  pregabalin (LYRICA) 50 MG capsule Take 75 mg by mouth 2 (two) times daily.    Yes Historical Provider, MD  traZODone (DESYREL) 100 MG tablet Take 100 mg by mouth at bedtime.     Yes Historical Provider, MD  cyclobenzaprine (FLEXERIL) 10 MG tablet Take 5 mg by mouth 2 (two) times daily as needed.    Historical Provider, MD    Allergies as of 08/06/2012 - Review Complete 08/06/2012  Allergen Reaction Noted  . Aspirin    . Cefprozil    . Cefuroxime axetil  08/08/2008  . Clarithromycin   08/08/2008  . Codeine    . Entex  08/08/2008  . Mesalamine    . Penicillins  08/08/2008    Family History  Problem Relation Age of Onset  . Colon cancer Neg Hx     History   Social History  . Marital Status: Married    Spouse Name: N/A    Number of Children: 3  . Years of Education: N/A   Occupational History  . 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 Smoking status: Former Smoker -- 1.0 packs/day for 30 years    Types: Cigarettes    Quit date: 09/29/2000  . Smokeless tobacco: Never Used  . Alcohol Use: No  . Drug Use: No  . Sexually Active: Yes -- Female partner(s)    Birth Control/ Protection: None     Comment: spouse   Other Topics Concern  . Not on file   Social History Narrative  . No narrative on file    Review of Systems: See HPI, otherwise negative ROS  Physical Exam: BP 129/72  Pulse 90  Temp 97.4 F (36.3 C) (Temporal)  Ht 5' 4"  (1.626 m)  Wt 181 lb 12.8 oz (82.464 kg)  BMI 31.21 kg/m2 General:   Alert,  Well-developed, well-nourished, pleasant and cooperative in NAD Skin:  Intact without significant lesions or rashes. Eyes:  Sclera clear, no icterus.   Conjunctiva pink. Ears:  Normal auditory acuity. Nose:  No deformity, discharge,  or lesions. Mouth:  No deformity or lesions. Neck:  Supple; no masses or thyromegaly. No significant cervical adenopathy. Lungs:  Clear throughout to auscultation.   No wheezes, crackles, or rhonchi. No acute distress. Heart:  Regular rate and rhythm; no murmurs, clicks, rubs,  or gallops. Abdomen: Nondistended. Positive bowel sounds soft nontender without appreciable mass or organomegaly  Pulses:  Normal pulses noted. Extremities:  Without clubbing or edema.  Impression/Plan:  Very pleasant 61 year old lady with  long-standing complicated GERD with a Schatzki's ring status post dilation previously. Historically, reflux symptoms well controlled on Prevacid once daily until now. She has a  benign abdominal examination today. Recurrent esophageal dysphagia needs further evaluation/intervention in addition to further workup of worsening GERD. She really does not give a history of pill impaction.  Colitis felt to be in remission.  Recommendations:   Outpatient EGD with esophageal dilation as appropriate to evaluate and manage her recent symptoms .The risks, benefits, limitations, alternatives and imponderables have been reviewed with the patient. Potential for esophageal dilation, biopsy, etc. have also been reviewed.  Questions have been answered. All parties agreeable.

## 2012-08-12 ENCOUNTER — Telehealth: Payer: Self-pay | Admitting: Internal Medicine

## 2012-08-12 NOTE — Telephone Encounter (Signed)
Patients EGD with ED scheduled on 09/01/12 has been approved by [employer] Ref# [id number]

## 2012-08-23 ENCOUNTER — Encounter (HOSPITAL_COMMUNITY): Payer: Self-pay | Admitting: Pharmacy Technician

## 2012-08-31 MED ORDER — SODIUM CHLORIDE 0.45 % IV SOLN
INTRAVENOUS | Status: DC
  Administered 2012-09-01: 1000 mL via INTRAVENOUS

## 2012-09-01 ENCOUNTER — Ambulatory Visit (HOSPITAL_COMMUNITY)
Admission: RE | Admit: 2012-09-01 | Discharge: 2012-09-01 | Disposition: A | Discharge status: Home / Self Care | Source: Ambulatory Visit | Attending: Internal Medicine | Admitting: Internal Medicine

## 2012-09-01 ENCOUNTER — Encounter (HOSPITAL_COMMUNITY)
Admission: RE | Disposition: A | Discharge status: Home / Self Care | Payer: Self-pay | Source: Ambulatory Visit | Attending: Internal Medicine

## 2012-09-01 ENCOUNTER — Encounter (HOSPITAL_COMMUNITY): Payer: Self-pay | Admitting: *Deleted

## 2012-09-01 DIAGNOSIS — R1310 Dysphagia, unspecified: Secondary | ICD-10-CM

## 2012-09-01 DIAGNOSIS — J449 Chronic obstructive pulmonary disease, unspecified: Secondary | ICD-10-CM | POA: Insufficient documentation

## 2012-09-01 DIAGNOSIS — K219 Gastro-esophageal reflux disease without esophagitis: Secondary | ICD-10-CM | POA: Insufficient documentation

## 2012-09-01 DIAGNOSIS — K222 Esophageal obstruction: Secondary | ICD-10-CM

## 2012-09-01 DIAGNOSIS — K449 Diaphragmatic hernia without obstruction or gangrene: Secondary | ICD-10-CM | POA: Insufficient documentation

## 2012-09-01 DIAGNOSIS — Z01812 Encounter for preprocedural laboratory examination: Secondary | ICD-10-CM | POA: Insufficient documentation

## 2012-09-01 DIAGNOSIS — J4489 Other specified chronic obstructive pulmonary disease: Secondary | ICD-10-CM | POA: Insufficient documentation

## 2012-09-01 HISTORY — PX: ESOPHAGOGASTRODUODENOSCOPY (EGD) WITH ESOPHAGEAL DILATION: SHX5812

## 2012-09-01 HISTORY — DX: Ventricular premature depolarization: I49.3

## 2012-09-01 LAB — GLUCOSE, CAPILLARY: Glucose-Capillary: 120 mg/dL — ABNORMAL HIGH (ref 70–99)

## 2012-09-01 SURGERY — ESOPHAGOGASTRODUODENOSCOPY (EGD) WITH ESOPHAGEAL DILATION
Anesthesia: Moderate Sedation

## 2012-09-01 MED ORDER — MEPERIDINE HCL 100 MG/ML IJ SOLN
INTRAMUSCULAR | Status: DC | PRN
  Administered 2012-09-01: 50 mg via INTRAVENOUS
  Administered 2012-09-01: 25 mg via INTRAVENOUS

## 2012-09-01 MED ORDER — STERILE WATER FOR IRRIGATION IR SOLN
Status: DC | PRN
  Administered 2012-09-01: 09:00:00

## 2012-09-01 MED ORDER — MEPERIDINE HCL 100 MG/ML IJ SOLN
INTRAMUSCULAR | Status: AC
  Filled 2012-09-01: qty 2

## 2012-09-01 MED ORDER — MIDAZOLAM HCL 5 MG/5ML IJ SOLN
INTRAMUSCULAR | Status: AC
  Filled 2012-09-01: qty 10

## 2012-09-01 MED ORDER — BUTAMBEN-TETRACAINE-BENZOCAINE 2-2-14 % EX AERO
INHALATION_SPRAY | CUTANEOUS | Status: DC | PRN
  Administered 2012-09-01: 2 via TOPICAL

## 2012-09-01 MED ORDER — MIDAZOLAM HCL 5 MG/5ML IJ SOLN
INTRAMUSCULAR | Status: DC | PRN
  Administered 2012-09-01: 1 mg via INTRAVENOUS
  Administered 2012-09-01 (×2): 2 mg via INTRAVENOUS

## 2012-09-01 NOTE — H&P (View-Only) (Signed)
Primary Care Physician:  Gar Ponto, MD Primary Gastroenterologist:  Dr. Gala Romney  Pre-Procedure History & Physical: HPI:  [name] is a 61 y.o. female here for flareup of GERD and recurrent dysphagia. History of Schatzki's ring previously dilated.  Now with recurrent esophageal dysphagia the past few months. Prevacid increased to 30 mg orally twice a day. No improvement. No change in weight or other medications. No early satiety, nausea, vomiting, melena or hematochezia. Bowel function normal most of the time. Only rare episodes of diarrhea.  UC appears to be under good control on Colazol.  Past Medical History  Diagnosis Date  . Depression   . Ulcerative colitis 2007  . GERD (gastroesophageal reflux disease)   . Pancreatitis   . COPD (chronic obstructive pulmonary disease)   . Diverticulitis   . Sleep apnea   . Schatzki's ring   . DDD (degenerative disc disease)   . Chronic back pain   . Arthritis     Past Surgical History  Procedure Date  . Tonsillectomy   . Dilation and curettage of uterus   . Hernia repair   . Cholecystectomy   . Cervical cone biopsy   . Arch and foot   . Esophagogastroduodenoscopy 01/28/2011    Rourk- Schatzki ring, s/p 82F, otherwise normal/Hiatal hernia otherwise normal stomach D1 and D2  . Colonoscopy 12/03/05    Rourk-marked inflammatory changes of the rectum, left colon, pan colitis, internal hemorrhoids, sigmoid diverticula  . Esophagogastroduodenoscopy 11/30/08    Rourk-Schatzki's ring status post dilation, hiatal hernia  . Esophagogastroduodenoscopy 07/12/2007    Dilation with 25 French, Schatzki's ring    Prior to Admission medications   Medication Sig Start Date End Date Taking? Authorizing Provider  albuterol (PROVENTIL,VENTOLIN) 90 MCG/ACT inhaler Inhale 2 puffs into the lungs every 4 (four) hours as needed.     Yes Historical Provider, MD  aspirin 81 MG tablet Take 81 mg by mouth daily.     Yes Historical Provider, MD  azelastine  (ASTELIN) 137 MCG/SPRAY nasal spray 2 sprays by Nasal route 2 (two) times daily. Use in each nostril as directed    Yes Historical Provider, MD  balsalazide (COLAZAL) 750 MG capsule TAKE 3 CAPSULES BY MOUTH 3 TIMES A DAY 04/15/12  Yes Andria Meuse, NP  buPROPion (WELLBUTRIN XL) 300 MG 24 hr tablet Take 300 mg by mouth daily.     Yes Historical Provider, MD  Calcium Carbonate-Vitamin D (CALCIUM + D PO) Take 1 tablet by mouth 2 (two) times daily.     Yes Historical Provider, MD  cetirizine (ZYRTEC) 10 MG chewable tablet Chew 10 mg by mouth daily.     Yes Historical Provider, MD  DULoxetine (CYMBALTA) 60 MG capsule Take 60 mg by mouth daily.     Yes Historical Provider, MD  ferrous gluconate (FERGON) 225 (27 FE) MG tablet Take 240 mg by mouth 3 (three) times daily with meals.     Yes Historical Provider, MD  Fluticasone-Salmeterol (ADVAIR DISKUS) 100-50 MCG/DOSE AEPB Inhale 1 puff into the lungs every 12 (twelve) hours.     Yes Historical Provider, MD  ketotifen (ZADITOR) 0.025 % ophthalmic solution Place 2 drops into both eyes 2 (two) times daily.     Yes Historical Provider, MD  lansoprazole (PREVACID) 30 MG capsule Take 30 mg by mouth 2 (two) times daily.    Yes Historical Provider, MD  metFORMIN (GLUMETZA) 500 MG (MOD) 24 hr tablet Take 1,000 mg by mouth daily with breakfast.  Yes Historical Provider, MD  methylcellulose packet Take 1 each by mouth 2 (two) times daily.     Yes Historical Provider, MD  pregabalin (LYRICA) 50 MG capsule Take 75 mg by mouth 2 (two) times daily.    Yes Historical Provider, MD  traZODone (DESYREL) 100 MG tablet Take 100 mg by mouth at bedtime.     Yes Historical Provider, MD  cyclobenzaprine (FLEXERIL) 10 MG tablet Take 5 mg by mouth 2 (two) times daily as needed.    Historical Provider, MD    Allergies as of 08/06/2012 - Review Complete 08/06/2012  Allergen Reaction Noted  . Aspirin    . Cefprozil    . Cefuroxime axetil  08/08/2008  . Clarithromycin   08/08/2008  . Codeine    . Entex  08/08/2008  . Mesalamine    . Penicillins  08/08/2008    Family History  Problem Relation Age of Onset  . Colon cancer Neg Hx     History   Social History  . Marital Status: Married    Spouse Name: N/A    Number of Children: 3  . Years of Education: N/A   Occupational History  . 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 Smoking status: Former Smoker -- 1.0 packs/day for 30 years    Types: Cigarettes    Quit date: 09/29/2000  . Smokeless tobacco: Never Used  . Alcohol Use: No  . Drug Use: No  . Sexually Active: Yes -- Female partner(s)    Birth Control/ Protection: None     Comment: spouse   Other Topics Concern  . Not on file   Social History Narrative  . No narrative on file    Review of Systems: See HPI, otherwise negative ROS  Physical Exam: BP 129/72  Pulse 90  Temp 97.4 F (36.3 C) (Temporal)  Ht 5' 4"  (1.626 m)  Wt 181 lb 12.8 oz (82.464 kg)  BMI 31.21 kg/m2 General:   Alert,  Well-developed, well-nourished, pleasant and cooperative in NAD Skin:  Intact without significant lesions or rashes. Eyes:  Sclera clear, no icterus.   Conjunctiva pink. Ears:  Normal auditory acuity. Nose:  No deformity, discharge,  or lesions. Mouth:  No deformity or lesions. Neck:  Supple; no masses or thyromegaly. No significant cervical adenopathy. Lungs:  Clear throughout to auscultation.   No wheezes, crackles, or rhonchi. No acute distress. Heart:  Regular rate and rhythm; no murmurs, clicks, rubs,  or gallops. Abdomen: Nondistended. Positive bowel sounds soft nontender without appreciable mass or organomegaly  Pulses:  Normal pulses noted. Extremities:  Without clubbing or edema.  Impression/Plan:  Very pleasant 61 year old lady with  long-standing complicated GERD with a Schatzki's ring status post dilation previously. Historically, reflux symptoms well controlled on Prevacid once daily until now. She has a  benign abdominal examination today. Recurrent esophageal dysphagia needs further evaluation/intervention in addition to further workup of worsening GERD. She really does not give a history of pill impaction.  Colitis felt to be in remission.  Recommendations:   Outpatient EGD with esophageal dilation as appropriate to evaluate and manage her recent symptoms .The risks, benefits, limitations, alternatives and imponderables have been reviewed with the patient. Potential for esophageal dilation, biopsy, etc. have also been reviewed.  Questions have been answered. All parties agreeable.

## 2012-09-01 NOTE — Op Note (Signed)
[place], [id number]   ENDOSCOPY PROCEDURE REPORT  PATIENT: [name], [name]  MR#: [id number] BIRTHDATE: March 19, 1951 , 61  yrs. old GENDER: Female ENDOSCOPIST: R.  Garfield Cornea, MD FACP [facility] REFERRED BY:  Gar Ponto, M.D. PROCEDURE DATE:  09/01/2012 PROCEDURE:     EGD with [name] dilation  INDICATIONS:    Refractory GERD; recurrent esophageal dysphagia  INFORMED CONSENT:   The risks, benefits, limitations, alternatives and imponderables have been discussed.  The potential for biopsy, esophogeal dilation, etc. have also been reviewed.  Questions have been answered.  All parties agreeable.  Please see the history and physical in the medical record for more information.  MEDICATIONS:   Versed 5 mg IV Demeroll 75 mg IV in divided doses. Cetacaine spray.  DESCRIPTION OF PROCEDURE:   The EG-[id number] ([id number])  endoscope was introduced through the mouth and advanced to the second portion of the duodenum without difficulty or limitations.  The mucosal surfaces were surveyed very carefully during advancement of the scope and upon withdrawal.  Retroflexion view of the proximal stomach and esophagogastric junction was performed.      FINDINGS: Prominent Schatzki's ring; otherwise, normal esophagus. Stomach empty. Large hiatal hernia. Gastric mucosa appeared normal. Patent pylorus. Normal first and second portion of the duodenum.  THERAPEUTIC / DIAGNOSTIC MANEUVERS PERFORMED:  A 54 French Maloney dilator was passed to full insertion easily. Subsequently, a 73 French Maloney dilator was passed to full insertion with a distinct "give" felt upon full insertion. A look back revealed the ring had been nicely ruptured with minimal bleeding and without complication.   COMPLICATIONS:  None  IMPRESSION:  Schatzki's ring-status post dilation and disruption as described above. Hiatal hernia.  RECOMMENDATIONS:  Stop Prevacid; begin Protonix 40  mg orally twice daily. Office visit with [place] in 3 months.    _______________________________ R. Garfield Cornea, MD FACP [facility] eSigned:  R. Garfield Cornea, MD FACP [facility] 09/01/2012 9:54 AM     CC:  PATIENT NAME:  [name], [name] MR#: [id number]

## 2012-09-01 NOTE — Interval H&P Note (Signed)
History and Physical Interval Note:  09/01/2012 9:18 AM  [name]  has presented today for surgery, with the diagnosis of Dysphagia and GERD  The various methods of treatment have been discussed with the patient and family. After consideration of risks, benefits and other options for treatment, the patient has consented to  Procedure(s) (LRB) with comments: ESOPHAGOGASTRODUODENOSCOPY (EGD) WITH ESOPHAGEAL DILATION (N/A) - 9:30 as a surgical intervention .  The patient's history has been reviewed, patient examined, no change in status, stable for surgery.  I have reviewed the patient's chart and labs.  Questions were answered to the patient's satisfaction.     Robert Rourk  No change. EGD with esophageal dilation, etc. as appropriate.The risks, benefits, limitations, alternatives and imponderables have been reviewed with the patient. Potential for esophageal dilation, biopsy, etc. have also been reviewed.  Questions have been answered. All parties agreeable.

## 2012-09-06 ENCOUNTER — Encounter (HOSPITAL_COMMUNITY): Payer: Self-pay | Admitting: Internal Medicine

## 2012-12-24 ENCOUNTER — Ambulatory Visit (INDEPENDENT_AMBULATORY_CARE_PROVIDER_SITE_OTHER): Admitting: Internal Medicine

## 2012-12-24 ENCOUNTER — Encounter: Payer: Self-pay | Admitting: Internal Medicine

## 2012-12-24 VITALS — BP 110/66 | HR 94 | Temp 97.6°F | Ht 64.0 in | Wt 182.0 lb

## 2012-12-24 DIAGNOSIS — K219 Gastro-esophageal reflux disease without esophagitis: Secondary | ICD-10-CM

## 2012-12-24 DIAGNOSIS — K5120 Ulcerative (chronic) proctitis without complications: Secondary | ICD-10-CM

## 2012-12-24 NOTE — Progress Notes (Signed)
Primary Care Physician:  Gar Ponto, MD Primary Gastroenterologist:  Dr. Gala Romney  Pre-Procedure History & Physical: HPI:  [name] is a 62 y.o. female here for followup and EC and GERD. GERD symptoms breaking parole for tonics 40 mg twice daily. She's gained 30 pounds the past 8 years. No dysphagia since her ring underwent Past Medical History  Diagnosis Date  . Depression   . Ulcerative colitis 2007  . GERD (gastroesophageal reflux disease)   . Pancreatitis   . COPD (chronic obstructive pulmonary disease)   . Diverticulitis   . Sleep apnea   . Schatzki's ring   . DDD (degenerative disc disease)   . Chronic back pain   . Arthritis   . PVC's (premature ventricular contractions)   . Hiatal hernia     Past Surgical History  Procedure Laterality Date  . Tonsillectomy    . Dilation and curettage of uterus    . Hernia repair    . Cholecystectomy    . Cervical cone biopsy    . Arch and foot    . Esophagogastroduodenoscopy  01/28/2011    Lavalle Skoda- Schatzki ring, s/p 38F, otherwise normal/Hiatal hernia otherwise normal stomach D1 and D2  . Colonoscopy  12/03/05    Kylii Ennis-marked inflammatory changes of the rectum, left colon, pan colitis, internal hemorrhoids, sigmoid diverticula  . Esophagogastroduodenoscopy  11/30/08    Shereena Berquist-Schatzki's ring status post dilation, hiatal hernia  . Esophagogastroduodenoscopy  07/12/2007    Dilation with 56 French, Schatzki's ring  . Esophagogastroduodenoscopy (egd) with esophageal dilation  09/01/2012    Dr. Gala Romney- schatzki's ring, hiatal hernia    Prior to Admission medications   Medication Sig Start Date End Date Taking? Authorizing Provider  albuterol (PROVENTIL,VENTOLIN) 90 MCG/ACT inhaler Inhale 2 puffs into the lungs every 4 (four) hours as needed.     Yes Historical Provider, MD  aspirin 81 MG tablet Take 81 mg by mouth daily.     Yes Historical Provider, MD  azelastine (ASTELIN) 137 MCG/SPRAY nasal spray 2 sprays by Nasal route 2 (two)  times daily. Use in each nostril as directed    Yes Historical Provider, MD  balsalazide (COLAZAL) 750 MG capsule TAKE 3 CAPSULES BY MOUTH 3 TIMES A DAY 04/15/12  Yes Andria Meuse, NP  buPROPion (WELLBUTRIN SR) 150 MG 12 hr tablet Take 150 mg by mouth daily.   Yes Historical Provider, MD  Calcium Carbonate-Vitamin D (CALCIUM + D PO) Take 1 tablet by mouth 2 (two) times daily.     Yes Historical Provider, MD  cetirizine (ZYRTEC) 10 MG chewable tablet Chew 10 mg by mouth daily.     Yes Historical Provider, MD  cyclobenzaprine (FLEXERIL) 10 MG tablet Take 10 mg by mouth 3 (three) times daily as needed for muscle spasms.   Yes Historical Provider, MD  DULoxetine (CYMBALTA) 60 MG capsule Take 60 mg by mouth daily.     Yes Historical Provider, MD  ferrous gluconate (FERGON) 225 (27 FE) MG tablet Take 240 mg by mouth 3 (three) times daily with meals.     Yes Historical Provider, MD  Fluticasone-Salmeterol (ADVAIR DISKUS) 100-50 MCG/DOSE AEPB Inhale 1 puff into the lungs every 12 (twelve) hours.     Yes Historical Provider, MD  ketotifen (ZADITOR) 0.025 % ophthalmic solution Place 2 drops into both eyes 2 (two) times daily.     Yes Historical Provider, MD  metFORMIN (GLUMETZA) 500 MG (MOD) 24 hr tablet Take 1,000 mg by mouth daily with breakfast.  Yes Historical Provider, MD  pregabalin (LYRICA) 75 MG capsule Take 75 mg by mouth 2 (two) times daily.   Yes Historical Provider, MD  traZODone (DESYREL) 100 MG tablet Take 100 mg by mouth at bedtime.     Yes Historical Provider, MD    Allergies as of 12/24/2012 - Review Complete 12/24/2012  Allergen Reaction Noted  . Aspirin Other (See Comments)   . Cefprozil Other (See Comments)   . Cefuroxime axetil Other (See Comments) 08/08/2008  . Clarithromycin Nausea And Vomiting 08/08/2008  . Codeine Other (See Comments)   . Entex Other (See Comments) 08/08/2008  . Mesalamine Other (See Comments)   . Morphine and related Hives 08/23/2012  . Penicillins Hives  08/08/2008    Family History  Problem Relation Age of Onset  . Colon cancer Neg Hx     History   Social History  . Marital Status: Married    Spouse Name: N/A    Number of Children: 3  . Years of Education: N/A   Occupational History  . 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 Smoking status: Former Smoker -- 1.00 packs/day for 30 years    Types: Cigarettes    Quit date: 09/29/2000  . Smokeless tobacco: Never Used  . Alcohol Use: No  . Drug Use: No  . Sexually Active: Yes -- Female partner(s)    Birth Control/ Protection: None     Comment: spouse   Other Topics Concern  . Not on file   Social History Narrative  . No narrative on file    Review of Systems: See HPI, otherwise negative ROS  Physical Exam: BP 110/66  Pulse 94  Temp(Src) 97.6 F (36.4 C) (Oral)  Ht 5' 4"  (1.626 m)  Wt 182 lb (82.555 kg)  BMI 31.22 kg/m2 General:   Alert,  Well-developed, well-nourished, pleasant and cooperative in NAD Skin:  Intact without significant lesions or rashes. Eyes:  Sclera clear, no icterus.   Conjunctiva pink. Ears:  Normal auditory acuity. Nose:  No deformity, discharge,  or lesions. Mouth:  No deformity or lesions. Neck:  Supple; no masses or thyromegaly. No significant cervical adenopathy. Lungs:  Clear throughout to auscultation.   No wheezes, crackles, or rhonchi. No acute distress. Heart:  Regular rate and rhythm; no murmurs, clicks, rubs,  or gallops. Abdomen: Non-distended, normal bowel sounds.  Soft and nontender without appreciable mass or hepatosplenomegaly.  Pulses:  Normal pulses noted. Extremities:  Without clubbing or edema.  Impression/Plan:  62 year old lady with UC in remission and chronic GERD; she is having heartburn symptoms for 5 times weekly. Frequently nocturnally. Some dietary indiscretion. No alarm features. No history Barrett's esophagus on prior EGD.      Recommendations:  Continue current IBD regimen. Stop  Protonix; begin Nexium 40 mg daily; may use Pepcid complete for breakthrough symptoms. Antireflux lifestyle/literature provided.   Office visit in 4 months

## 2012-12-24 NOTE — Patient Instructions (Signed)
Stop Protonix; begin Nexium 40 mg daily  Use Pepsid complete for breakthrough symptoms  GERD information provided  Loose 10 pounds this year  Office visit in 4 months

## 2013-02-22 ENCOUNTER — Encounter: Payer: Self-pay | Admitting: General Practice

## 2013-04-12 ENCOUNTER — Encounter: Payer: Self-pay | Admitting: Internal Medicine

## 2013-04-12 ENCOUNTER — Ambulatory Visit (INDEPENDENT_AMBULATORY_CARE_PROVIDER_SITE_OTHER): Admitting: Internal Medicine

## 2013-04-12 ENCOUNTER — Other Ambulatory Visit: Payer: Self-pay

## 2013-04-12 VITALS — BP 121/75 | HR 93 | Temp 97.4°F | Ht 64.0 in | Wt 175.6 lb

## 2013-04-12 DIAGNOSIS — K5190 Ulcerative colitis, unspecified, without complications: Secondary | ICD-10-CM

## 2013-04-12 DIAGNOSIS — K219 Gastro-esophageal reflux disease without esophagitis: Secondary | ICD-10-CM

## 2013-04-12 MED ORDER — BALSALAZIDE DISODIUM 750 MG PO CAPS: 2250.0000 mg | ORAL_CAPSULE | Freq: Two times a day (BID) | ORAL | Status: DC

## 2013-04-12 NOTE — Patient Instructions (Addendum)
Continue Colazol daily   BMET today  (check with [facility] to see if they have labs before sending her for a blood draw)  Continue Nexium daily  Continue weight loss  Office visit January 2015

## 2013-04-12 NOTE — Progress Notes (Signed)
Primary Care Physician:  Gar Ponto, MD Primary Gastroenterologist:  Dr. Gala Romney  Pre-Procedure History & Physical: HPI:  [name] is a 62 y.o. 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 Has done well on Colazol   2.25 g twice a day not 3 times a day. Did develop a diarrheal illness couple weeks and took Cipro and Flagyl prescribed elsewhere. She's almost back to baseline (1-3 stools daily). No stool studies done. No blood per rectum. Initial diagnosis of ulcerative colitis 2007. In controlling reflux symptoms very well. No dysphagia. She's lost 7 pounds and she was last seen in the office. She may have had a serum creatinine done at her PCPs office earlier this year.  Past Medical History  Diagnosis Date  . Depression   . Ulcerative colitis 2007  . GERD (gastroesophageal reflux disease)   . Pancreatitis   . COPD (chronic obstructive pulmonary disease)   . Diverticulitis   . Sleep apnea   . Schatzki's ring   . DDD (degenerative disc disease)   . Chronic back pain   . Arthritis   . PVC's (premature ventricular contractions)   . Hiatal hernia     Past Surgical History  Procedure Laterality Date  . Tonsillectomy    . Dilation and curettage of uterus    . Hernia repair    . Cholecystectomy    . Cervical cone biopsy    . Arch and foot    . Esophagogastroduodenoscopy  01/28/2011    Aubery Date- Schatzki ring, s/p 33F, otherwise normal/Hiatal hernia otherwise normal stomach D1 and D2  . Colonoscopy  12/03/05    Tationa Stech-marked inflammatory changes of the rectum, left colon, pan colitis, internal hemorrhoids, sigmoid diverticula  . Esophagogastroduodenoscopy  11/30/08    Yu Cragun-Schatzki's ring status post dilation, hiatal hernia  . Esophagogastroduodenoscopy  07/12/2007    Dilation with 56 French, Schatzki's ring  . Esophagogastroduodenoscopy (egd) with esophageal dilation  09/01/2012    Dr. Gala Romney- schatzki's ring, hiatal hernia    Prior to Admission medications    Medication Sig Start Date End Date Taking? Authorizing Provider  albuterol (PROVENTIL,VENTOLIN) 90 MCG/ACT inhaler Inhale 2 puffs into the lungs every 4 (four) hours as needed.     Yes Historical Provider, MD  aspirin 81 MG tablet Take 81 mg by mouth daily.     Yes Historical Provider, MD  azelastine (ASTELIN) 137 MCG/SPRAY nasal spray 2 sprays by Nasal route 2 (two) times daily. Use in each nostril as directed    Yes Historical Provider, MD  balsalazide (COLAZAL) 750 MG capsule TAKE 3 CAPSULES BY MOUTH 3 TIMES A DAY 04/15/12  Yes Andria Meuse, NP  buPROPion (WELLBUTRIN SR) 150 MG 12 hr tablet Take 150 mg by mouth daily.   Yes Historical Provider, MD  Calcium Carbonate-Vitamin D (CALCIUM + D PO) Take 1 tablet by mouth 2 (two) times daily.     Yes Historical Provider, MD  cetirizine (ZYRTEC) 10 MG chewable tablet Chew 10 mg by mouth daily.     Yes Historical Provider, MD  DULoxetine (CYMBALTA) 60 MG capsule Take 60 mg by mouth daily.     Yes Historical Provider, MD  ferrous gluconate (FERGON) 225 (27 FE) MG tablet Take 240 mg by mouth 3 (three) times daily with meals.     Yes Historical Provider, MD  Fluticasone-Salmeterol (ADVAIR DISKUS) 100-50 MCG/DOSE AEPB Inhale 1 puff into the lungs every 12 (twelve) hours.     Yes Historical Provider, MD  ketotifen (ZADITOR)  0.025 % ophthalmic solution Place 2 drops into both eyes 2 (two) times daily.     Yes Historical Provider, MD  metFORMIN (GLUMETZA) 500 MG (MOD) 24 hr tablet Take 1,000 mg by mouth daily with breakfast.     Yes Historical Provider, MD  pregabalin (LYRICA) 75 MG capsule Take 75 mg by mouth 2 (two) times daily.   Yes Historical Provider, MD  traZODone (DESYREL) 100 MG tablet Take 100 mg by mouth at bedtime.     Yes Historical Provider, MD  cyclobenzaprine (FLEXERIL) 10 MG tablet Take 10 mg by mouth 3 (three) times daily as needed for muscle spasms.    Historical Provider, MD    Allergies as of 04/12/2013 - Review Complete 04/12/2013   Allergen Reaction Noted  . Aspirin Other (See Comments)   . Cefprozil Other (See Comments)   . Cefuroxime axetil Other (See Comments) 08/08/2008  . Clarithromycin Nausea And Vomiting 08/08/2008  . Codeine Other (See Comments)   . Entex Other (See Comments) 08/08/2008  . Mesalamine Other (See Comments)   . Morphine and related Hives 08/23/2012  . Penicillins Hives 08/08/2008    Family History  Problem Relation Age of Onset  . Colon cancer Neg Hx     History   Social History  . Marital Status: Married    Spouse Name: N/A    Number of Children: 3  . Years of Education: N/A   Occupational History  . 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 Smoking status: Former Smoker -- 1.00 packs/day for 30 years    Types: Cigarettes    Quit date: 09/29/2000  . Smokeless tobacco: Never Used  . Alcohol Use: No  . Drug Use: No  . Sexually Active: Yes -- Female partner(s)    Birth Control/ Protection: None     Comment: spouse   Other Topics Concern  . Not on file   Social History Narrative  . No narrative on file    Review of Systems: See HPI, otherwise negative ROS  Physical Exam: BP 121/75  Pulse 93  Temp(Src) 97.4 F (36.3 C) (Oral)  Ht 5' 4"  (1.626 m)  Wt 175 lb 9.6 oz (79.652 kg)  BMI 30.13 kg/m2 General:   Alert,  Well-developed, well-nourished, pleasant and cooperative in NAD Skin:  Intact without significant lesions or rashes. Eyes:  Sclera clear, no icterus.   Conjunctiva pink. Ears:  Normal auditory acuity. Nose:  No deformity, discharge,  or lesions. Mouth:  No deformity or lesions. Neck:  Supple; no masses or thyromegaly. No significant cervical adenopathy. Lungs:  Clear throughout to auscultation.   No wheezes, crackles, or rhonchi. No acute distress. Heart:  Regular rate and rhythm; no murmurs, clicks, rubs,  or gallops. Abdomen: Non-distended, normal bowel sounds.  Soft and nontender without appreciable mass or hepatosplenomegaly.   Pulses:  Normal pulses noted. Extremities:  Without clubbing or edema.  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 Doing well. She may have had an acute infectious diarrhea a few weeks back.  GERD symptoms well controlled on Nexium.   Recommendations: Continue Colazal 2.25 g orally twice daily as this seems to be working for her nicely. Serum creatinine this year. Continue weight loss. Continue Nexium 40 mg daily.  Office followup January 2015 to set up a surveillance colonoscopy. Patient is to call with any interim problems.

## 2013-06-27 ENCOUNTER — Telehealth: Payer: Self-pay | Admitting: *Deleted

## 2013-06-27 MED ORDER — ESOMEPRAZOLE MAGNESIUM 40 MG PO CPDR: 40.0000 mg | DELAYED_RELEASE_CAPSULE | Freq: Every day | ORAL | Status: DC

## 2013-06-27 NOTE — Telephone Encounter (Signed)
Pt called stating she needs to get her nexium refilled to [facility] in [place]. Please advise [phone]

## 2013-06-27 NOTE — Telephone Encounter (Signed)
Completed.

## 2013-06-27 NOTE — Telephone Encounter (Signed)
Routing to refill box

## 2013-07-18 ENCOUNTER — Encounter: Payer: Self-pay | Admitting: Cardiology

## 2013-08-18 ENCOUNTER — Encounter: Payer: Self-pay | Admitting: Cardiology

## 2013-09-22 ENCOUNTER — Encounter: Payer: Self-pay | Admitting: Cardiology

## 2013-10-12 ENCOUNTER — Ambulatory Visit (INDEPENDENT_AMBULATORY_CARE_PROVIDER_SITE_OTHER): Admitting: Internal Medicine

## 2013-10-12 ENCOUNTER — Encounter (HOSPITAL_COMMUNITY): Payer: Self-pay | Admitting: Pharmacy Technician

## 2013-10-12 ENCOUNTER — Encounter (INDEPENDENT_AMBULATORY_CARE_PROVIDER_SITE_OTHER): Payer: Self-pay

## 2013-10-12 ENCOUNTER — Encounter: Payer: Self-pay | Admitting: Internal Medicine

## 2013-10-12 VITALS — BP 114/68 | HR 90 | Temp 98.4°F | Wt 176.4 lb

## 2013-10-12 DIAGNOSIS — K5190 Ulcerative colitis, unspecified, without complications: Secondary | ICD-10-CM

## 2013-10-12 MED ORDER — PEG 3350-KCL-NA BICARB-NACL 420 G PO SOLR: 4000.0000 mL | ORAL | Status: DC

## 2013-10-12 NOTE — Patient Instructions (Signed)
Schedule screening colonoscopy (history of ulcerative colitis)

## 2013-10-12 NOTE — Progress Notes (Signed)
Primary Care Physician:  Paula Ponto, MD Primary Gastroenterologist:  Dr. Gala Bean  Pre-Procedure History & Physical: HPI:  [name] is a 63 y.o. female here for followup of an ulcerative colitis. Diagnosed back in 2007 probably has had symptoms a good 10 years. Doing well on Colazol; No tenesmus, rectal bleeding or diarrhea. If anything, gets constipated on a regular basis no family history of polyps or colorectal neoplasia. Labs were good through Dr. Olena Bean office last month. In particular, creatinine remains normal.  Past Medical History  Diagnosis Date  . Depression   . Ulcerative colitis 2007  . GERD (gastroesophageal reflux disease)   . Pancreatitis   . COPD (chronic obstructive pulmonary disease)   . Diverticulitis   . Sleep apnea   . Schatzki's ring   . DDD (degenerative disc disease)   . Chronic back pain   . Arthritis   . PVC's (premature ventricular contractions)   . Hiatal hernia     Past Surgical History  Procedure Laterality Date  . Tonsillectomy    . Dilation and curettage of uterus    . Hernia repair    . Cholecystectomy    . Cervical cone biopsy    . Arch and foot    . Esophagogastroduodenoscopy  01/28/2011    [name]- Schatzki ring, s/p 54F, otherwise normal/Hiatal hernia otherwise normal stomach D1 and D2  . Colonoscopy  12/03/05    [name]-marked inflammatory changes of the rectum, left colon, pan colitis, internal hemorrhoids, sigmoid diverticula  . Esophagogastroduodenoscopy  11/30/08    [name]-Schatzki's ring status post dilation, hiatal hernia  . Esophagogastroduodenoscopy  07/12/2007    Dilation with 56 French, Schatzki's ring  . Esophagogastroduodenoscopy (egd) with esophageal dilation  09/01/2012    Dr. Gala Bean- schatzki's ring, hiatal hernia    Prior to Admission medications   Medication Sig Start Date End Date Taking? Authorizing Provider  albuterol (PROVENTIL,VENTOLIN) 90 MCG/ACT inhaler Inhale 2 puffs into the lungs every 4 (four) hours as  needed.     Yes Historical Provider, MD  aspirin 81 MG tablet Take 81 mg by mouth daily.     Yes Historical Provider, MD  azelastine (ASTELIN) 137 MCG/SPRAY nasal spray 2 sprays by Nasal route 2 (two) times daily. Use in each nostril as directed    Yes Historical Provider, MD  balsalazide (COLAZAL) 750 MG capsule Take 3 capsules (2,250 mg total) by mouth 2 (two) times daily. 04/12/13  Yes Paula Dolin, MD  buPROPion [facility] SR) 150 MG 12 hr tablet Take 150 mg by mouth daily.   Yes Historical Provider, MD  busPIRone (BUSPAR) 15 MG tablet Take 15 mg by mouth 2 (two) times daily. 1/2 tablet bid   Yes Historical Provider, MD  Calcium Carbonate-Vitamin D (CALCIUM + D PO) Take 1 tablet by mouth 2 (two) times daily.     Yes Historical Provider, MD  cetirizine (ZYRTEC) 10 MG chewable tablet Chew 10 mg by mouth daily.     Yes Historical Provider, MD  cyclobenzaprine (FLEXERIL) 10 MG tablet Take 10 mg by mouth 3 (three) times daily as needed for muscle spasms.   Yes Historical Provider, MD  DULoxetine (CYMBALTA) 60 MG capsule Take 60 mg by mouth daily.     Yes Historical Provider, MD  esomeprazole (NEXIUM) 40 MG capsule Take 1 capsule (40 mg total) by mouth daily before breakfast. 06/27/13  Yes Paula Feil, NP  ferrous gluconate (FERGON) 225 (27 FE) MG tablet Take 240 mg by mouth 3 (three) times  daily with meals.     Yes Historical Provider, MD  Fluticasone-Salmeterol (ADVAIR DISKUS) 100-50 MCG/DOSE AEPB Inhale 1 puff into the lungs every 12 (twelve) hours.     Yes Historical Provider, MD  ketotifen (ZADITOR) 0.025 % ophthalmic solution Place 2 drops into both eyes 2 (two) times daily.     Yes Historical Provider, MD  metFORMIN (GLUMETZA) 500 MG (MOD) 24 hr tablet Take 1,000 mg by mouth daily with breakfast.     Yes Historical Provider, MD  pregabalin (LYRICA) 75 MG capsule Take 75 mg by mouth 2 (two) times daily.   Yes Historical Provider, MD  traZODone (DESYREL) 100 MG tablet Take 100 mg by mouth at  bedtime.     Yes Historical Provider, MD  polyethylene glycol-electrolytes (TRILYTE) 420 G solution Take 4,000 mLs by mouth as directed. 10/12/13   Paula Dolin, MD    Allergies as of 10/12/2013 - Review Complete 10/12/2013  Allergen Reaction Noted  . Aspirin Other (See Comments)   . Cefprozil Other (See Comments)   . Cefuroxime axetil Other (See Comments) 08/08/2008  . Clarithromycin Nausea And Vomiting 08/08/2008  . Codeine Other (See Comments)   . Entex Other (See Comments) 08/08/2008  . Mesalamine Other (See Comments)   . Morphine and related Hives 08/23/2012  . Penicillins Hives 08/08/2008    Family History  Problem Relation Age of Onset  . Colon cancer Neg Hx     History   Social History  . Marital Status: Married    Spouse Name: N/A    Number of Children: 3  . Years of Education: N/A   Occupational History  . 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 Smoking status: Former Smoker -- 1.00 packs/day for 30 years    Types: Cigarettes    Quit date: 09/29/2000  . Smokeless tobacco: Never Used  . Alcohol Use: No  . Drug Use: No  . Sexual Activity: Yes    Partners: Male    Birth Control/ Protection: None     Comment: spouse   Other Topics Concern  . Not on file   Social History Narrative  . No narrative on file    Review of Systems: See HPI, otherwise negative ROS  Physical Exam: BP 114/68  Pulse 90  Temp(Src) 98.4 F (36.9 C) (Oral)  Wt 176 lb 6.4 oz (80.015 kg) General:   Alert,  Well-developed, well-nourished, pleasant and cooperative in NAD Skin:  Intact without significant lesions or rashes. Eyes:  Sclera clear, no icterus.   Conjunctiva pink. Ears:  Normal auditory acuity. Nose:  No deformity, discharge,  or lesions. Mouth:  No deformity or lesions. Neck:  Supple; no masses or thyromegaly. No significant cervical adenopathy. Lungs:  Clear throughout to auscultation.   No wheezes, crackles, or rhonchi. No acute  distress. Heart:  Regular rate and rhythm; no murmurs, clicks, rubs,  or gallops. Abdomen: Obese. Positive bowel sounds.  Soft and nontender without appreciable mass or hepatosplenomegaly.  Pulses:  Normal pulses noted. Extremities:  Without clubbing or edema.  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 She is tolerating therapy very well. It's been nearly 10 years since she last had her colon surveyed.  We discussed screening colonoscopy this year.The risks, benefits, limitations, alternatives and imponderables have been reviewed with the patient. Questions have been answered. All parties are agreeable.  Will plan  for colonoscopy in the near future.

## 2013-10-20 ENCOUNTER — Ambulatory Visit (HOSPITAL_COMMUNITY)
Admission: RE | Admit: 2013-10-20 | Discharge: 2013-10-20 | Disposition: A | Discharge status: Home / Self Care | Source: Ambulatory Visit | Attending: Internal Medicine | Admitting: Internal Medicine

## 2013-10-20 ENCOUNTER — Encounter (HOSPITAL_COMMUNITY)
Admission: RE | Disposition: A | Discharge status: Home / Self Care | Payer: Self-pay | Source: Ambulatory Visit | Attending: Internal Medicine

## 2013-10-20 ENCOUNTER — Encounter (HOSPITAL_COMMUNITY): Payer: Self-pay | Admitting: *Deleted

## 2013-10-20 DIAGNOSIS — K5730 Diverticulosis of large intestine without perforation or abscess without bleeding: Secondary | ICD-10-CM | POA: Insufficient documentation

## 2013-10-20 DIAGNOSIS — Z1211 Encounter for screening for malignant neoplasm of colon: Secondary | ICD-10-CM

## 2013-10-20 DIAGNOSIS — Z7982 Long term (current) use of aspirin: Secondary | ICD-10-CM | POA: Insufficient documentation

## 2013-10-20 DIAGNOSIS — J4489 Other specified chronic obstructive pulmonary disease: Secondary | ICD-10-CM | POA: Insufficient documentation

## 2013-10-20 DIAGNOSIS — K5289 Other specified noninfective gastroenteritis and colitis: Secondary | ICD-10-CM | POA: Insufficient documentation

## 2013-10-20 DIAGNOSIS — E119 Type 2 diabetes mellitus without complications: Secondary | ICD-10-CM | POA: Insufficient documentation

## 2013-10-20 DIAGNOSIS — Z01812 Encounter for preprocedural laboratory examination: Secondary | ICD-10-CM | POA: Insufficient documentation

## 2013-10-20 DIAGNOSIS — Z8719 Personal history of other diseases of the digestive system: Secondary | ICD-10-CM

## 2013-10-20 DIAGNOSIS — J449 Chronic obstructive pulmonary disease, unspecified: Secondary | ICD-10-CM | POA: Insufficient documentation

## 2013-10-20 DIAGNOSIS — K5190 Ulcerative colitis, unspecified, without complications: Secondary | ICD-10-CM

## 2013-10-20 DIAGNOSIS — K633 Ulcer of intestine: Secondary | ICD-10-CM

## 2013-10-20 HISTORY — PX: COLONOSCOPY: SHX5424

## 2013-10-20 LAB — GLUCOSE, CAPILLARY: GLUCOSE-CAPILLARY: 112 mg/dL — AB (ref 70–99)

## 2013-10-20 SURGERY — COLONOSCOPY
Anesthesia: Moderate Sedation

## 2013-10-20 MED ORDER — MIDAZOLAM HCL 5 MG/5ML IJ SOLN
INTRAMUSCULAR | Status: AC
  Filled 2013-10-20: qty 10

## 2013-10-20 MED ORDER — MEPERIDINE HCL 100 MG/ML IJ SOLN
INTRAMUSCULAR | Status: DC | PRN
  Administered 2013-10-20: 50 mg via INTRAVENOUS
  Administered 2013-10-20: 25 mg via INTRAVENOUS
  Administered 2013-10-20: 50 mg via INTRAVENOUS

## 2013-10-20 MED ORDER — MEPERIDINE HCL 100 MG/ML IJ SOLN
INTRAMUSCULAR | Status: AC
  Filled 2013-10-20: qty 2

## 2013-10-20 MED ORDER — ONDANSETRON HCL 4 MG/2ML IJ SOLN
INTRAMUSCULAR | Status: AC
  Filled 2013-10-20: qty 2

## 2013-10-20 MED ORDER — MIDAZOLAM HCL 5 MG/5ML IJ SOLN
INTRAMUSCULAR | Status: DC | PRN
  Administered 2013-10-20 (×2): 1 mg via INTRAVENOUS
  Administered 2013-10-20: 2 mg via INTRAVENOUS
  Administered 2013-10-20 (×2): 1 mg via INTRAVENOUS
  Administered 2013-10-20: 2 mg via INTRAVENOUS

## 2013-10-20 MED ORDER — STERILE WATER FOR IRRIGATION IR SOLN
Status: DC | PRN
  Administered 2013-10-20: 14:00:00

## 2013-10-20 MED ORDER — SODIUM CHLORIDE 0.9 % IV SOLN
INTRAVENOUS | Status: DC
  Administered 2013-10-20: 12:00:00 via INTRAVENOUS

## 2013-10-20 MED ORDER — ONDANSETRON HCL 4 MG/2ML IJ SOLN
INTRAMUSCULAR | Status: DC | PRN
  Administered 2013-10-20: 4 mg via INTRAMUSCULAR

## 2013-10-20 NOTE — Discharge Instructions (Addendum)
Colonoscopy Discharge Instructions  Read the instructions outlined below and refer to this sheet in the next few weeks. These discharge instructions provide you with general information on caring for yourself after you leave the hospital. Your doctor may also give you specific instructions. While your treatment has been planned according to the most current medical practices available, unavoidable complications occasionally occur. If you have any problems or questions after discharge, call Dr. Gala Romney at [phone]. ACTIVITY  You may resume your regular activity, but move at a slower pace for the next 24 hours.   Take frequent rest periods for the next 24 hours.   Walking will help get rid of the air and reduce the bloated feeling in your belly (abdomen).   No driving for 24 hours (because of the medicine (anesthesia) used during the test).    Do not sign any important legal documents or operate any machinery for 24 hours (because of the anesthesia used during the test).  NUTRITION  Drink plenty of fluids.   You may resume your normal diet as instructed by your doctor.   Begin with a light meal and progress to your normal diet. Heavy or fried foods are harder to digest and may make you feel sick to your stomach (nauseated).   Avoid alcoholic beverages for 24 hours or as instructed.  MEDICATIONS  You may resume your normal medications unless your doctor tells you otherwise.  WHAT YOU CAN EXPECT TODAY  Some feelings of bloating in the abdomen.   Passage of more gas than usual.   Spotting of blood in your stool or on the toilet paper.  IF YOU HAD POLYPS REMOVED DURING THE COLONOSCOPY:  No aspirin products for 7 days or as instructed.   No alcohol for 7 days or as instructed.   Eat a soft diet for the next 24 hours.  FINDING OUT THE RESULTS OF YOUR TEST Not all test results are available during your visit. If your test results are not back during the visit, make an appointment  with your caregiver to find out the results. Do not assume everything is normal if you have not heard from your caregiver or the medical facility. It is important for you to follow up on all of your test results.  SEEK IMMEDIATE MEDICAL ATTENTION IF:  You have more than a spotting of blood in your stool.   Your belly is swollen (abdominal distention).   You are nauseated or vomiting.   You have a temperature over 101.   You have abdominal pain or discomfort that is severe or gets worse throughout the day.   Diverticulosis and colitis information provided  Further recommendations to follow pending review of pathology report  Colitis Colitis is inflammation of the colon. Colitis can be a short-term or long-standing (chronic) illness. Crohn's disease and ulcerative colitis are 2 types of colitis which are chronic. They usually require lifelong treatment. CAUSES  There are many different causes of colitis, including:  Viruses.  Germs (bacteria).  Medicine reactions. SYMPTOMS   Diarrhea.  Intestinal bleeding.  Pain.  Fever.  Throwing up (vomiting).  Tiredness (fatigue).  Weight loss.  Bowel blockage. DIAGNOSIS  The diagnosis of colitis is based on examination and stool or blood tests. X-rays, CT scan, and colonoscopy may also be needed. TREATMENT  Treatment may include:  Fluids given through the vein (intravenously).  Bowel rest (nothing to eat or drink for a period of time).  Medicine for pain and diarrhea.  Medicines (antibiotics) that  kill germs.  Cortisone medicines.  Surgery. HOME CARE INSTRUCTIONS   Get plenty of rest.  Drink enough water and fluids to keep your urine clear or pale yellow.  Eat a well-balanced diet.  Call your caregiver for follow-up as recommended. SEEK IMMEDIATE MEDICAL CARE IF:   You develop chills.  You have an oral temperature above 102 F (38.9 C), not controlled by medicine.  You have extreme weakness, fainting, or  dehydration.  You have repeated vomiting.  You develop severe belly (abdominal) pain or are passing bloody or tarry stools. MAKE SURE YOU:   Understand these instructions.  Will watch your condition.  Will get help right away if you are not doing well or get worse. Document Released: 10/23/2004 Document Revised: 12/08/2011 Document Reviewed: 01/18/2010 [facility] Patient Information 2014 [place], [place]. Diverticulosis Diverticulosis is a common condition that develops when small pouches (diverticula) form in the wall of the colon. The risk of diverticulosis increases with age. It happens more often in people who eat a low-fiber diet. Most individuals with diverticulosis have no symptoms. Those individuals with symptoms usually experience abdominal pain, constipation, or loose stools (diarrhea). HOME CARE INSTRUCTIONS   Increase the amount of fiber in your diet as directed by your caregiver or dietician. This may reduce symptoms of diverticulosis.  Your caregiver may recommend taking a dietary fiber supplement.  Drink at least 6 to 8 glasses of water each day to prevent constipation.  Try not to strain when you have a bowel movement.  Your caregiver may recommend avoiding nuts and seeds to prevent complications, although this is still an uncertain benefit.  Only take over-the-counter or prescription medicines for pain, discomfort, or fever as directed by your caregiver. FOODS WITH HIGH FIBER CONTENT INCLUDE:  Fruits. Apple, peach, pear, tangerine, raisins, prunes.  Vegetables. Brussels sprouts, asparagus, broccoli, cabbage, carrot, cauliflower, romaine lettuce, spinach, summer squash, tomato, winter squash, zucchini.  Starchy Vegetables. Baked beans, kidney beans, lima beans, split peas, lentils, potatoes (with skin).  Grains. Whole wheat bread, brown rice, bran flake cereal, plain oatmeal, white rice, shredded wheat, bran muffins. SEEK IMMEDIATE MEDICAL CARE IF:   You develop  increasing pain or severe bloating.  You have an oral temperature above 102 F (38.9 C), not controlled by medicine.  You develop vomiting or bowel movements that are bloody or black. Document Released: 06/12/2004 Document Revised: 12/08/2011 Document Reviewed: 02/13/2010 [facility] Patient Information 2014 [place].

## 2013-10-20 NOTE — H&P (View-Only) (Signed)
Primary Care Physician:  Gar Ponto, MD Primary Gastroenterologist:  Dr. Gala Romney  Pre-Procedure History & Physical: HPI:  [name] is a 63 y.o. female here for followup of an ulcerative colitis. Diagnosed back in 2007 probably has had symptoms a good 10 years. Doing well on Colazol; No tenesmus, rectal bleeding or diarrhea. If anything, gets constipated on a regular basis no family history of polyps or colorectal neoplasia. Labs were good through Dr. Olena Heckle office last month. In particular, creatinine remains normal.  Past Medical History  Diagnosis Date  . Depression   . Ulcerative colitis 2007  . GERD (gastroesophageal reflux disease)   . Pancreatitis   . COPD (chronic obstructive pulmonary disease)   . Diverticulitis   . Sleep apnea   . Schatzki's ring   . DDD (degenerative disc disease)   . Chronic back pain   . Arthritis   . PVC's (premature ventricular contractions)   . Hiatal hernia     Past Surgical History  Procedure Laterality Date  . Tonsillectomy    . Dilation and curettage of uterus    . Hernia repair    . Cholecystectomy    . Cervical cone biopsy    . Arch and foot    . Esophagogastroduodenoscopy  01/28/2011    Katherine Tout- Schatzki ring, s/p 62F, otherwise normal/Hiatal hernia otherwise normal stomach D1 and D2  . Colonoscopy  12/03/05    Gwen Sarvis-marked inflammatory changes of the rectum, left colon, pan colitis, internal hemorrhoids, sigmoid diverticula  . Esophagogastroduodenoscopy  11/30/08    Laiden Milles-Schatzki's ring status post dilation, hiatal hernia  . Esophagogastroduodenoscopy  07/12/2007    Dilation with 56 French, Schatzki's ring  . Esophagogastroduodenoscopy (egd) with esophageal dilation  09/01/2012    Dr. Gala Romney- schatzki's ring, hiatal hernia    Prior to Admission medications   Medication Sig Start Date End Date Taking? Authorizing Provider  albuterol (PROVENTIL,VENTOLIN) 90 MCG/ACT inhaler Inhale 2 puffs into the lungs every 4 (four) hours as  needed.     Yes Historical Provider, MD  aspirin 81 MG tablet Take 81 mg by mouth daily.     Yes Historical Provider, MD  azelastine (ASTELIN) 137 MCG/SPRAY nasal spray 2 sprays by Nasal route 2 (two) times daily. Use in each nostril as directed    Yes Historical Provider, MD  balsalazide (COLAZAL) 750 MG capsule Take 3 capsules (2,250 mg total) by mouth 2 (two) times daily. 04/12/13  Yes Daneil Dolin, MD  buPROPion [facility] SR) 150 MG 12 hr tablet Take 150 mg by mouth daily.   Yes Historical Provider, MD  busPIRone (BUSPAR) 15 MG tablet Take 15 mg by mouth 2 (two) times daily. 1/2 tablet bid   Yes Historical Provider, MD  Calcium Carbonate-Vitamin D (CALCIUM + D PO) Take 1 tablet by mouth 2 (two) times daily.     Yes Historical Provider, MD  cetirizine (ZYRTEC) 10 MG chewable tablet Chew 10 mg by mouth daily.     Yes Historical Provider, MD  cyclobenzaprine (FLEXERIL) 10 MG tablet Take 10 mg by mouth 3 (three) times daily as needed for muscle spasms.   Yes Historical Provider, MD  DULoxetine (CYMBALTA) 60 MG capsule Take 60 mg by mouth daily.     Yes Historical Provider, MD  esomeprazole (NEXIUM) 40 MG capsule Take 1 capsule (40 mg total) by mouth daily before breakfast. 06/27/13  Yes Orvil Feil, NP  ferrous gluconate (FERGON) 225 (27 FE) MG tablet Take 240 mg by mouth 3 (three) times  daily with meals.     Yes Historical Provider, MD  Fluticasone-Salmeterol (ADVAIR DISKUS) 100-50 MCG/DOSE AEPB Inhale 1 puff into the lungs every 12 (twelve) hours.     Yes Historical Provider, MD  ketotifen (ZADITOR) 0.025 % ophthalmic solution Place 2 drops into both eyes 2 (two) times daily.     Yes Historical Provider, MD  metFORMIN (GLUMETZA) 500 MG (MOD) 24 hr tablet Take 1,000 mg by mouth daily with breakfast.     Yes Historical Provider, MD  pregabalin (LYRICA) 75 MG capsule Take 75 mg by mouth 2 (two) times daily.   Yes Historical Provider, MD  traZODone (DESYREL) 100 MG tablet Take 100 mg by mouth at  bedtime.     Yes Historical Provider, MD  polyethylene glycol-electrolytes (TRILYTE) 420 G solution Take 4,000 mLs by mouth as directed. 10/12/13   Daneil Dolin, MD    Allergies as of 10/12/2013 - Review Complete 10/12/2013  Allergen Reaction Noted  . Aspirin Other (See Comments)   . Cefprozil Other (See Comments)   . Cefuroxime axetil Other (See Comments) 08/08/2008  . Clarithromycin Nausea And Vomiting 08/08/2008  . Codeine Other (See Comments)   . Entex Other (See Comments) 08/08/2008  . Mesalamine Other (See Comments)   . Morphine and related Hives 08/23/2012  . Penicillins Hives 08/08/2008    Family History  Problem Relation Age of Onset  . Colon cancer Neg Hx     History   Social History  . Marital Status: Married    Spouse Name: N/A    Number of Children: 3  . Years of Education: N/A   Occupational History  . 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 Smoking status: Former Smoker -- 1.00 packs/day for 30 years    Types: Cigarettes    Quit date: 09/29/2000  . Smokeless tobacco: Never Used  . Alcohol Use: No  . Drug Use: No  . Sexual Activity: Yes    Partners: Male    Birth Control/ Protection: None     Comment: spouse   Other Topics Concern  . Not on file   Social History Narrative  . No narrative on file    Review of Systems: See HPI, otherwise negative ROS  Physical Exam: BP 114/68  Pulse 90  Temp(Src) 98.4 F (36.9 C) (Oral)  Wt 176 lb 6.4 oz (80.015 kg) General:   Alert,  Well-developed, well-nourished, pleasant and cooperative in NAD Skin:  Intact without significant lesions or rashes. Eyes:  Sclera clear, no icterus.   Conjunctiva pink. Ears:  Normal auditory acuity. Nose:  No deformity, discharge,  or lesions. Mouth:  No deformity or lesions. Neck:  Supple; no masses or thyromegaly. No significant cervical adenopathy. Lungs:  Clear throughout to auscultation.   No wheezes, crackles, or rhonchi. No acute  distress. Heart:  Regular rate and rhythm; no murmurs, clicks, rubs,  or gallops. Abdomen: Obese. Positive bowel sounds.  Soft and nontender without appreciable mass or hepatosplenomegaly.  Pulses:  Normal pulses noted. Extremities:  Without clubbing or edema.  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 She is tolerating therapy very well. It's been nearly 10 years since she last had her colon surveyed.  We discussed screening colonoscopy this year.The risks, benefits, limitations, alternatives and imponderables have been reviewed with the patient. Questions have been answered. All parties are agreeable.  Will plan  for colonoscopy in the near future.

## 2013-10-20 NOTE — Op Note (Signed)
[facility] [address] [place], [address]   COLONOSCOPY PROCEDURE REPORT  PATIENT: [name], [name]  MR#:         [id number] BIRTHDATE: [birth year] , 62  yrs. old GENDER: Female ENDOSCOPIST: R.  Garfield Cornea, MD FACP [facility] REFERRED BY:  Gar Ponto, M.D. PROCEDURE DATE:  10/20/2013 PROCEDURE:     Colonoscopy with Segmental biopsy  INDICATIONS: History of pan- proctocolitis;  Colorectal cancer screening examination  INFORMED CONSENT:  The risks, benefits, alternatives and imponderables including but not limited to bleeding, perforation as well as the possibility of a missed lesion have been reviewed.  The potential for biopsy, lesion removal, etc. have also been discussed.  Questions have been answered.  All parties agreeable. Please see the history and physical in the medical record for more information.  MEDICATIONS: Versed 8 mg IV and Demerol 100 mg IV in divided doses Zofran 4 mg IV  DESCRIPTION OF PROCEDURE:  After a digital rectal exam was performed, the EC-[id number] ([id number])  colonoscope was advanced from the anus through the rectum and colon to the area of the cecum, ileocecal valve and appendiceal orifice.  The cecum was deeply intubated.  These structures were well-seen and photographed for the record.  From the level of the cecum and ileocecal valve, the scope was slowly and cautiously withdrawn.  The mucosal surfaces were carefully surveyed utilizing scope tip deflection to facilitate fold flattening as needed.  The scope was pulled down into the rectum where a thorough examination including retroflexion was performed.    FINDINGS:  Adequate preparation. Endoscopically normal-appearing rectum. Densely populated left-sided diverticula; there was about 25 cm segment of sigmoid colon which was markedly inflamed edematous with scattered ulcerations. Mucosa was friable and easily bled when manipulated with the scope. This was involving  the proximal sigmoid - the distal sigmoid and rectum, again, appeared endoscopically normal;  proximal to the sigmoid  -  ,all the way to the cecum, aside from descending colon diverticula,  the colonic mucosa appeared entirely normal.  THERAPEUTIC / DIAGNOSTIC MANEUVERS PERFORMED:  Segmental biopsies of the normal-appearing  ascending, transverse, descending and abnormal-appearing sigmoid mucosa were taken for histologic study. The rectum was also biopsied separately.  COMPLICATIONS: None  CECAL WITHDRAWAL TIME:  20 minutes  IMPRESSION:  Colonic diverticulosis. Colonic mucosal ulcerations involving primarily a segment of sigmoid colon as described above. Distribution of mucosal inflammation somewhat peculiar. We may be looking at diverticular colitis. An element of ischemia not excluded, if inflammation limited to a band of sigmoid colon. Endoscopically, current picture atypical for ulcerative colitis. I suppose Crohn's disease would remain in the differential at this time. However, histology should help sort out.  Please note this lady has had no recent chronic symptoms of colitis from any cause, whatsoever. She is chronically constipated these days. Denies abdominal pain, tenesmus, rectal bleeding or any diarrhea.    RECOMMENDATIONS: For now, continue current medical regimen. Further recommendations to follow.   _______________________________ eSigned:  R. Garfield Cornea, MD FACP [facility] 10/20/2013 3:02 PM   CC:    PATIENT NAME:  [name], [name] MR#: [id number]

## 2013-10-20 NOTE — Interval H&P Note (Signed)
History and Physical Interval Note:  10/20/2013 1:48 PM  [name]  has presented today for surgery, with the diagnosis of ULCERATIVE COLITIS  The various methods of treatment have been discussed with the patient and family. After consideration of risks, benefits and other options for treatment, the patient has consented to  Procedure(s) with comments: COLONOSCOPY (N/A) - 12:45 as a surgical intervention .  The patient's history has been reviewed, patient examined, no change in status, stable for surgery.  I have reviewed the patient's chart and labs.  Questions were answered to the patient's satisfaction.     No change. Colonoscopy per plan.The risks, benefits, limitations, alternatives and imponderables have been reviewed with the patient. Questions have been answered. All parties are agreeable.    Manus Rudd

## 2013-10-25 ENCOUNTER — Encounter: Payer: Self-pay | Admitting: Internal Medicine

## 2013-10-25 ENCOUNTER — Encounter (HOSPITAL_COMMUNITY): Payer: Self-pay | Admitting: Internal Medicine

## 2014-01-03 ENCOUNTER — Other Ambulatory Visit: Payer: Self-pay | Admitting: Internal Medicine

## 2014-01-05 ENCOUNTER — Other Ambulatory Visit: Payer: Self-pay | Admitting: Orthopedic Surgery

## 2014-01-23 ENCOUNTER — Encounter (HOSPITAL_BASED_OUTPATIENT_CLINIC_OR_DEPARTMENT_OTHER): Payer: Self-pay | Admitting: *Deleted

## 2014-01-23 NOTE — Progress Notes (Signed)
Will go to [facility] for [id number] To bring meds and cpap

## 2014-01-25 ENCOUNTER — Encounter (HOSPITAL_COMMUNITY)
Admission: RE | Admit: 2014-01-25 | Discharge: 2014-01-25 | Disposition: A | Discharge status: Home / Self Care | Source: Ambulatory Visit | Attending: Orthopedic Surgery | Admitting: Orthopedic Surgery

## 2014-01-25 ENCOUNTER — Other Ambulatory Visit: Payer: Self-pay

## 2014-01-25 LAB — BASIC METABOLIC PANEL
BUN: 12 mg/dL (ref 6–23)
CALCIUM: 9.8 mg/dL (ref 8.4–10.5)
CO2: 30 mEq/L (ref 19–32)
CREATININE: 0.82 mg/dL (ref 0.50–1.10)
Chloride: 101 mEq/L (ref 96–112)
GFR, EST AFRICAN AMERICAN: 86 mL/min — AB (ref >90)
GFR, EST NON AFRICAN AMERICAN: 74 mL/min — AB (ref >90)
Glucose, Bld: 143 mg/dL — ABNORMAL HIGH (ref 70–99)
Potassium: 4.3 mEq/L (ref 3.7–5.3)
Sodium: 141 mEq/L (ref 137–147)

## 2014-01-27 ENCOUNTER — Ambulatory Visit (HOSPITAL_BASED_OUTPATIENT_CLINIC_OR_DEPARTMENT_OTHER): Admitting: Anesthesiology

## 2014-01-27 ENCOUNTER — Encounter (HOSPITAL_BASED_OUTPATIENT_CLINIC_OR_DEPARTMENT_OTHER): Payer: Self-pay

## 2014-01-27 ENCOUNTER — Ambulatory Visit (HOSPITAL_BASED_OUTPATIENT_CLINIC_OR_DEPARTMENT_OTHER)
Admission: RE | Admit: 2014-01-27 | Discharge: 2014-01-27 | Disposition: A | Discharge status: Home / Self Care | Source: Ambulatory Visit | Attending: Orthopedic Surgery | Admitting: Orthopedic Surgery

## 2014-01-27 ENCOUNTER — Encounter (HOSPITAL_BASED_OUTPATIENT_CLINIC_OR_DEPARTMENT_OTHER): Admitting: Anesthesiology

## 2014-01-27 ENCOUNTER — Encounter (HOSPITAL_BASED_OUTPATIENT_CLINIC_OR_DEPARTMENT_OTHER)
Admission: RE | Disposition: A | Discharge status: Home / Self Care | Payer: Self-pay | Source: Ambulatory Visit | Attending: Orthopedic Surgery

## 2014-01-27 DIAGNOSIS — Z885 Allergy status to narcotic agent status: Secondary | ICD-10-CM | POA: Insufficient documentation

## 2014-01-27 DIAGNOSIS — IMO0002 Reserved for concepts with insufficient information to code with codable children: Secondary | ICD-10-CM | POA: Insufficient documentation

## 2014-01-27 DIAGNOSIS — M23302 Other meniscus derangements, unspecified lateral meniscus, unspecified knee: Secondary | ICD-10-CM | POA: Insufficient documentation

## 2014-01-27 DIAGNOSIS — Z87891 Personal history of nicotine dependence: Secondary | ICD-10-CM | POA: Insufficient documentation

## 2014-01-27 DIAGNOSIS — M23351 Other meniscus derangements, posterior horn of lateral meniscus, right knee: Secondary | ICD-10-CM | POA: Diagnosis present

## 2014-01-27 DIAGNOSIS — J4489 Other specified chronic obstructive pulmonary disease: Secondary | ICD-10-CM | POA: Insufficient documentation

## 2014-01-27 DIAGNOSIS — F329 Major depressive disorder, single episode, unspecified: Secondary | ICD-10-CM | POA: Insufficient documentation

## 2014-01-27 DIAGNOSIS — F3289 Other specified depressive episodes: Secondary | ICD-10-CM | POA: Insufficient documentation

## 2014-01-27 DIAGNOSIS — Z880 Allergy status to penicillin: Secondary | ICD-10-CM | POA: Insufficient documentation

## 2014-01-27 DIAGNOSIS — M2240 Chondromalacia patellae, unspecified knee: Secondary | ICD-10-CM | POA: Insufficient documentation

## 2014-01-27 DIAGNOSIS — Z79899 Other long term (current) drug therapy: Secondary | ICD-10-CM | POA: Insufficient documentation

## 2014-01-27 DIAGNOSIS — G4730 Sleep apnea, unspecified: Secondary | ICD-10-CM | POA: Insufficient documentation

## 2014-01-27 DIAGNOSIS — Z881 Allergy status to other antibiotic agents status: Secondary | ICD-10-CM | POA: Insufficient documentation

## 2014-01-27 DIAGNOSIS — K219 Gastro-esophageal reflux disease without esophagitis: Secondary | ICD-10-CM | POA: Insufficient documentation

## 2014-01-27 DIAGNOSIS — J449 Chronic obstructive pulmonary disease, unspecified: Secondary | ICD-10-CM | POA: Insufficient documentation

## 2014-01-27 DIAGNOSIS — Z886 Allergy status to analgesic agent status: Secondary | ICD-10-CM | POA: Insufficient documentation

## 2014-01-27 HISTORY — DX: Other meniscus derangements, posterior horn of lateral meniscus, right knee: M23.351

## 2014-01-27 HISTORY — PX: KNEE ARTHROSCOPY WITH LATERAL MENISECTOMY: SHX6193

## 2014-01-27 HISTORY — DX: Unspecified hearing loss, unspecified ear: H91.90

## 2014-01-27 HISTORY — DX: Presence of dental prosthetic device (complete) (partial): Z97.2

## 2014-01-27 LAB — POCT HEMOGLOBIN-HEMACUE: Hemoglobin: 12.3 g/dL (ref 12.0–15.0)

## 2014-01-27 LAB — GLUCOSE, CAPILLARY
GLUCOSE-CAPILLARY: 112 mg/dL — AB (ref 70–99)
Glucose-Capillary: 102 mg/dL — ABNORMAL HIGH (ref 70–99)

## 2014-01-27 SURGERY — ARTHROSCOPY, KNEE, WITH LATERAL MENISCECTOMY
Anesthesia: General | Site: Knee | Laterality: Right

## 2014-01-27 MED ORDER — BUPIVACAINE HCL (PF) 0.25 % IJ SOLN
INTRAMUSCULAR | Status: AC
  Filled 2014-01-27: qty 60

## 2014-01-27 MED ORDER — FENTANYL CITRATE 0.05 MG/ML IJ SOLN
INTRAMUSCULAR | Status: DC | PRN
  Administered 2014-01-27: 100 ug via INTRAVENOUS
  Administered 2014-01-27: 50 ug via INTRAVENOUS

## 2014-01-27 MED ORDER — PROMETHAZINE HCL 25 MG PO TABS
25.0000 mg | ORAL_TABLET | Freq: Once | ORAL | Status: AC
  Administered 2014-01-27: 25 mg via ORAL

## 2014-01-27 MED ORDER — PROPOFOL 10 MG/ML IV BOLUS
INTRAVENOUS | Status: DC | PRN
  Administered 2014-01-27: 110 mg via INTRAVENOUS

## 2014-01-27 MED ORDER — MIDAZOLAM HCL 2 MG/2ML IJ SOLN
INTRAMUSCULAR | Status: AC
  Filled 2014-01-27: qty 2

## 2014-01-27 MED ORDER — FENTANYL CITRATE 0.05 MG/ML IJ SOLN
50.0000 ug | INTRAMUSCULAR | Status: DC | PRN
Start: 2014-01-27 — End: 2014-01-27

## 2014-01-27 MED ORDER — BUPIVACAINE-EPINEPHRINE (PF) 0.5% -1:200000 IJ SOLN
INTRAMUSCULAR | Status: AC
  Filled 2014-01-27: qty 60

## 2014-01-27 MED ORDER — OXYCODONE-ACETAMINOPHEN 5-325 MG PO TABS: 1.0000 | ORAL_TABLET | Freq: Four times a day (QID) | ORAL | Status: DC | PRN

## 2014-01-27 MED ORDER — MIDAZOLAM HCL 2 MG/2ML IJ SOLN: 1.0000 mg | INTRAMUSCULAR | Status: DC | PRN

## 2014-01-27 MED ORDER — MIDAZOLAM HCL 5 MG/5ML IJ SOLN
INTRAMUSCULAR | Status: DC | PRN
  Administered 2014-01-27: 2 mg via INTRAVENOUS

## 2014-01-27 MED ORDER — SENNA-DOCUSATE SODIUM 8.6-50 MG PO TABS: 2.0000 | ORAL_TABLET | Freq: Every day | ORAL | Status: DC

## 2014-01-27 MED ORDER — ONDANSETRON HCL 4 MG/2ML IJ SOLN
INTRAMUSCULAR | Status: DC | PRN
  Administered 2014-01-27: 4 mg via INTRAVENOUS

## 2014-01-27 MED ORDER — HYDROMORPHONE HCL PF 1 MG/ML IJ SOLN
INTRAMUSCULAR | Status: AC
  Filled 2014-01-27: qty 1

## 2014-01-27 MED ORDER — PROMETHAZINE HCL 25 MG PO TABS: 25.0000 mg | ORAL_TABLET | Freq: Four times a day (QID) | ORAL | Status: DC | PRN

## 2014-01-27 MED ORDER — FENTANYL CITRATE 0.05 MG/ML IJ SOLN
INTRAMUSCULAR | Status: AC
  Filled 2014-01-27: qty 6

## 2014-01-27 MED ORDER — SODIUM CHLORIDE 0.9 % IR SOLN
Status: DC | PRN
  Administered 2014-01-27: 6000 mL

## 2014-01-27 MED ORDER — HYDROMORPHONE HCL PF 1 MG/ML IJ SOLN
0.2500 mg | INTRAMUSCULAR | Status: DC | PRN
  Administered 2014-01-27: 0.5 mg via INTRAVENOUS

## 2014-01-27 MED ORDER — DEXAMETHASONE SODIUM PHOSPHATE 4 MG/ML IJ SOLN
INTRAMUSCULAR | Status: DC | PRN
  Administered 2014-01-27: 10 mg via INTRAVENOUS

## 2014-01-27 MED ORDER — LACTATED RINGERS IV SOLN
INTRAVENOUS | Status: DC
  Administered 2014-01-27 (×2): via INTRAVENOUS

## 2014-01-27 MED ORDER — PROMETHAZINE HCL 25 MG PO TABS
ORAL_TABLET | ORAL | Status: AC
  Filled 2014-01-27: qty 1

## 2014-01-27 MED ORDER — BUPIVACAINE HCL (PF) 0.5 % IJ SOLN
INTRAMUSCULAR | Status: AC
  Filled 2014-01-27: qty 60

## 2014-01-27 MED ORDER — LIDOCAINE HCL (CARDIAC) 20 MG/ML IV SOLN
INTRAVENOUS | Status: DC | PRN
  Administered 2014-01-27: 50 mg via INTRAVENOUS

## 2014-01-27 SURGICAL SUPPLY — 47 items
APL SKNCLS STERI-STRIP NONHPOA (GAUZE/BANDAGES/DRESSINGS) ×1
BANDAGE ELASTIC 6 VELCRO ST LF (GAUZE/BANDAGES/DRESSINGS) ×3 IMPLANT
BANDAGE ESMARK 6X9 LF (GAUZE/BANDAGES/DRESSINGS) IMPLANT
BENZOIN TINCTURE PRP APPL 2/3 (GAUZE/BANDAGES/DRESSINGS) ×3 IMPLANT
BLADE CUTTER GATOR 3.5 (BLADE) ×3 IMPLANT
BNDG CMPR 9X6 STRL LF SNTH (GAUZE/BANDAGES/DRESSINGS)
BNDG ESMARK 6X9 LF (GAUZE/BANDAGES/DRESSINGS)
CANISTER SUCT 3000ML (MISCELLANEOUS) IMPLANT
CLOSURE WOUND 1/2 X4 (GAUZE/BANDAGES/DRESSINGS) ×1
CUFF TOURNIQUET SINGLE 34IN LL (TOURNIQUET CUFF) IMPLANT
CUTTER KNOT PUSHER 2-0 FIBERWI (INSTRUMENTS) IMPLANT
CUTTER MENISCUS  4.2MM (BLADE)
CUTTER MENISCUS 4.2MM (BLADE) IMPLANT
DRAPE ARTHROSCOPY W/POUCH 90 (DRAPES) ×3 IMPLANT
DRAPE U 20/CS (DRAPES) ×3 IMPLANT
DURAPREP 26ML APPLICATOR (WOUND CARE) ×3 IMPLANT
ELECT MENISCUS 165MM 90D (ELECTRODE) IMPLANT
ELECT REM PT RETURN 9FT ADLT (ELECTROSURGICAL)
ELECTRODE REM PT RTRN 9FT ADLT (ELECTROSURGICAL) IMPLANT
GAUZE SPONGE 4X4 12PLY STRL (GAUZE/BANDAGES/DRESSINGS) ×3 IMPLANT
GLOVE BIO SURGEON STRL SZ 6.5 (GLOVE) ×1 IMPLANT
GLOVE BIO SURGEON STRL SZ8 (GLOVE) ×3 IMPLANT
GLOVE BIO SURGEONS STRL SZ 6.5 (GLOVE) ×1
GLOVE BIOGEL PI IND STRL 7.0 (GLOVE) IMPLANT
GLOVE BIOGEL PI IND STRL 8 (GLOVE) ×2 IMPLANT
GLOVE BIOGEL PI INDICATOR 7.0 (GLOVE) ×2
GLOVE BIOGEL PI INDICATOR 8 (GLOVE) ×4
GLOVE ORTHO TXT STRL SZ7.5 (GLOVE) ×3 IMPLANT
GOWN STRL REUS W/ TWL LRG LVL3 (GOWN DISPOSABLE) ×1 IMPLANT
GOWN STRL REUS W/ TWL XL LVL3 (GOWN DISPOSABLE) ×2 IMPLANT
GOWN STRL REUS W/TWL LRG LVL3 (GOWN DISPOSABLE) ×3
GOWN STRL REUS W/TWL XL LVL3 (GOWN DISPOSABLE) ×6
IV NS IRRIG 3000ML ARTHROMATIC (IV SOLUTION) ×6 IMPLANT
KNEE WRAP E Z 3 GEL PACK (MISCELLANEOUS) ×3 IMPLANT
MANIFOLD NEPTUNE II (INSTRUMENTS) ×3 IMPLANT
PACK ARTHROSCOPY DSU (CUSTOM PROCEDURE TRAY) ×3 IMPLANT
PACK BASIN DAY SURGERY FS (CUSTOM PROCEDURE TRAY) ×3 IMPLANT
PENCIL BUTTON HOLSTER BLD 10FT (ELECTRODE) IMPLANT
SET ARTHROSCOPY TUBING (MISCELLANEOUS) ×3
SET ARTHROSCOPY TUBING LN (MISCELLANEOUS) ×1 IMPLANT
SLEEVE SCD COMPRESS KNEE MED (MISCELLANEOUS) ×2 IMPLANT
STRIP CLOSURE SKIN 1/2X4 (GAUZE/BANDAGES/DRESSINGS) ×2 IMPLANT
SUT MNCRL AB 4-0 PS2 18 (SUTURE) ×3 IMPLANT
TOWEL OR 17X24 6PK STRL BLUE (TOWEL DISPOSABLE) ×3 IMPLANT
TOWEL OR NON WOVEN STRL DISP B (DISPOSABLE) ×3 IMPLANT
WAND STAR VAC 90 (SURGICAL WAND) IMPLANT
WATER STERILE IRR 1000ML POUR (IV SOLUTION) ×3 IMPLANT

## 2014-01-27 NOTE — Transfer of Care (Signed)
Immediate Anesthesia Transfer of Care Note  Patient: [name]  Procedure(s) Performed: Procedure(s): RIGHT KNEE ARTHROSCOPY WITH PARTIAL LATERAL MENISECTOMY,  (Right)  Patient Location: PACU  Anesthesia Type:General  Level of Consciousness: sedated  Airway & Oxygen Therapy: Patient Spontanous Breathing and Patient connected to face mask oxygen  Post-op Assessment: Report given to PACU RN and Post -op Vital signs reviewed and stable  Post vital signs: Reviewed and stable  Complications: No apparent anesthesia complications

## 2014-01-27 NOTE — Discharge Instructions (Signed)
Meniscus Injury, Arthroscopy Arthroscopy is a surgical procedure that involves the use of a small scope that has a camera and surgical instruments on the end (arthroscope). An arthroscope can be used to repair your meniscus injury.  LET [facility] CARE PROVIDER KNOW ABOUT:  Any allergies you have.  All medicines you are taking, including vitamins, herbs, eyedrops, creams, and over-the-counter medicines.  Any recent colds or infections you have had or currently have.  Previous problems you or members of your family have had with the use of anesthetics.  Any blood disorders or blood clotting problems you have.  Previous surgeries you have had.  Medical conditions you have. RISKS AND COMPLICATIONS Generally, this is a safe procedure. However, as with any procedure, complications can occur. Possible complications include:  Damage to nerves or blood vessels.  Excess bleeding.  Blood clots.  Infection. BEFORE THE PROCEDURE  Do not eat or drink for 6 8 hours before the procedure.  Take medicines as directed by your surgeon. Ask your surgeon about changing or stopping your regular medicines.  You may have lab tests the morning of surgery. PROCEDURE  You will be given one of the following:   A medicine that numbs the area (local anesthesia).  A medicine that makes you go to sleep (general anesthesia).  A medicine injected into your spine that numbs your body below the waist (spinal anesthesia). Most often, several small cuts (incisions) are made in the knee. The arthroscope and instruments go into the incisions to repair the damage. The torn portion of the meniscus are removed.  During this time, your surgeon may find a partial or complete tear in a cruciate ligament, such as the anterior cruciate ligament (ACL). A completely torn cruciate ligament is reconstructed by taking tissue from another part of the body (grafting) and placing it into the injured area. This requires several  larger incisions to complete the repair. Sometimes, open surgery is needed for collateral ligament injuries. If a collateral ligament is found to be injured, your surgeon may staple or suture the tear through a slightly larger incision on the side of the knee. AFTER THE PROCEDURE You will be taken to the recovery area where your progress will be monitored. When you are awake, stable, and taking fluids without complications, you will be allowed to go home. This is usually the same day. However, more extensive repairs of a ligament may require an overnight stay.  The recovery time after repairing your meniscus or ligament depends on the amount of damage to these structures. It also depends on whether or not reconstructive knee surgery was needed.   A torn or stretched ligament (ligament sprain) may take 6 8 weeks to heal. It takes about the same amount of time if your surgeon removed a torn meniscus.  A repaired meniscus may require 6 12 weeks of recovery time.  A torn ligament needing reconstructive surgery may take 6 12 months to heal fully. Document Released: 02-11-2000 Document Revised: 05/18/2013 Document Reviewed: 02/11/2013 [facility] Patient Information 2014 [place].   Diet: As you were doing prior to hospitalization   Shower:  May shower but keep the wounds dry, use an occlusive plastic wrap, NO SOAKING IN TUB.  If the bandage gets wet, change with a clean dry gauze.  Dressing:  You may change your dressing 3-5 days after surgery.  Then change the dressing daily with sterile gauze dressing.    There are sticky tapes (steri-strips) on your wounds and all the stitches  are absorbable.  Leave the steri-strips in place when changing your dressings, they will peel off with time, usually 2-3 weeks.  Activity:  Increase activity slowly as tolerated, but follow the weight bearing instructions below.  No lifting or driving for 6 weeks.  Weight Bearing:   As tolerated.    To prevent  constipation: you may use a stool softener such as -  Colace (over the counter) 100 mg by mouth twice a day  Drink plenty of fluids (prune juice may be helpful) and high fiber foods Miralax (over the counter) for constipation as needed.    Itching:  If you experience itching with your medications, try taking only a single pain pill, or even half a pain pill at a time.  You may take up to 10 pain pills per day, and you can also use benadryl over the counter for itching or also to help with sleep.   Precautions:  If you experience chest pain or shortness of breath - call 911 immediately for transfer to the hospital emergency department!!  If you develop a fever greater that 101 F, purulent drainage from wound, increased redness or drainage from wound, or calf pain -- Call the office at [phone]                                                Follow- Up Appointment:  Please call for an appointment to be seen in 2 weeks [place] - [phone]      Post Anesthesia Home Care Instructions  Activity: Get plenty of rest for the remainder of the day. A responsible adult should stay with you for 24 hours following the procedure.  For the next 24 hours, DO NOT: -Drive a car -Paediatric nurse -Drink alcoholic beverages -Take any medication unless instructed by your physician -Make any legal decisions or sign important papers.  Meals: Start with liquid foods such as gelatin or soup. Progress to regular foods as tolerated. Avoid greasy, spicy, heavy foods. If nausea and/or vomiting occur, drink only clear liquids until the nausea and/or vomiting subsides. Call your physician if vomiting continues.  Special Instructions/Symptoms: Your throat may feel dry or sore from the anesthesia or the breathing tube placed in your throat during surgery. If this causes discomfort, gargle with warm salt water. The discomfort should disappear within 24 hours.

## 2014-01-27 NOTE — Anesthesia Postprocedure Evaluation (Signed)
  Anesthesia Post-op Note  Patient: [name]  Procedure(s) Performed: Procedure(s): RIGHT KNEE ARTHROSCOPY WITH PARTIAL LATERAL MENISECTOMY,  (Right)  Patient Location: PACU  Anesthesia Type:General  Level of Consciousness: awake and alert   Airway and Oxygen Therapy: Patient Spontanous Breathing  Post-op Pain: mild  Post-op Assessment: Post-op Vital signs reviewed, Patient's Cardiovascular Status Stable and Respiratory Function Stable  Post-op Vital Signs: Reviewed  Filed Vitals:   01/27/14 0901  BP:   Pulse: 76  Temp:   Resp: 16    Complications: No apparent anesthesia complications

## 2014-01-27 NOTE — Anesthesia Procedure Notes (Signed)
Procedure Name: LMA Insertion Date/Time: 01/27/2014 7:35 AM Performed by: Lieutenant Diego Pre-anesthesia Checklist: Patient identified, Emergency Drugs available, Suction available and Patient being monitored Patient Re-evaluated:Patient Re-evaluated prior to inductionOxygen Delivery Method: Circle System Utilized Preoxygenation: Pre-oxygenation with 100% oxygen Intubation Type: IV induction Ventilation: Mask ventilation without difficulty LMA: LMA inserted LMA Size: 4.0 Number of attempts: 1 Airway Equipment and Method: bite block Placement Confirmation: positive ETCO2 and breath sounds checked- equal and bilateral Tube secured with: Tape Dental Injury: Teeth and Oropharynx as per pre-operative assessment

## 2014-01-27 NOTE — Op Note (Signed)
01/27/2014  8:14 AM  PATIENT:  [name]    PRE-OPERATIVE DIAGNOSIS:  RIGHT KNEE TEAR MENISCUS LATERAL   POST-OPERATIVE DIAGNOSIS:  Same  PROCEDURE:  RIGHT KNEE ARTHROSCOPY WITH PARTIAL LATERAL MENISECTOMY   SURGEON:  Johnny Bridge, MD  PHYSICIAN ASSISTANT: Joya Gaskins, OPA-C, present and scrubbed throughout the case, critical for completion in a timely fashion, and for positioning, instrumentation, and closure.  ANESTHESIA:   General  PREOPERATIVE INDICATIONS:  [name] is a  63 y.o. female with a diagnosis of RIGHT KNEE [place] who failed conservative measures and elected for surgical management.    The risks benefits and alternatives were discussed with the patient preoperatively including but not limited to the risks of infection, bleeding, nerve injury, cardiopulmonary complications, the need for revision surgery, among others, and the patient was willing to proceed.  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 Medial compartment was essentially normal. Lateral meniscus had a posterior horn tear which was radial in nature. The rest of the lateral meniscus appeared intact.  OPERATIVE PROCEDURE:  The patient was brought to the operating room and placed in the supine position. General anesthesia was administered.  The right lower extremity was prepped and draped in usual sterile fashion. Time out was performed. Diagnostic arthroscopy was carried out with 2 inferior portals. The above-named findings were noted.. the arthroscopic shaver and the arthroscopic biter was used to debride the posterior horn mid lateral meniscus tear to a stable configuration. Light chondroplasty of the tibial plateau on the lateral side was also performed.  Completion of diagnostic arthroscopy carried out, the patellar lesion was not amenable to microfracture.  The knee was drained, the knee was injected, the portals closed with Monocryl with Steri-Strips and sterile gauze. She was awakened and returned to PACU in stable and satisfactory condition. No complications.

## 2014-01-27 NOTE — H&P (Signed)
PREOPERATIVE H&P  Chief Complaint: RIGHT KNEE TEAR MENISCUS LATERAL   HPI: [name] is a 63 y.o. female who presents for preoperative history and physical with a diagnosis of RIGHT KNEE TEAR MENISCUS LATERAL. Symptoms are rated as moderate to severe, and have been worsening.  This is significantly impairing activities of daily living.  She has elected for surgical management. She has pain in his laterally located as well as anterior relief. She cannot take anti-inflammatories because of her ulcerative colitis, and has failed injections, pain as 10/10 at worst depending on activities.  Past Medical History  Diagnosis Date  . Depression   . Ulcerative colitis 2007  . GERD (gastroesophageal reflux disease)   . Pancreatitis   . COPD (chronic obstructive pulmonary disease)   . Diverticulitis   . Sleep apnea   . Schatzki's ring   . DDD (degenerative disc disease)   . Chronic back pain   . Arthritis   . PVC's (premature ventricular contractions)   . Hiatal hernia   . Wears dentures     full top-partial bottom  . HOH (hard of hearing)     right   Past Surgical History  Procedure Laterality Date  . Tonsillectomy    . Dilation and curettage of uterus    . Hernia repair    . Cholecystectomy    . Cervical cone biopsy    . Arch and foot    . Esophagogastroduodenoscopy  01/28/2011    Rourk- Schatzki ring, s/p 49F, otherwise normal/Hiatal hernia otherwise normal stomach D1 and D2  . Colonoscopy  12/03/05    Rourk-marked inflammatory changes of the rectum, left colon, pan colitis, internal hemorrhoids, sigmoid diverticula  . Esophagogastroduodenoscopy  11/30/08    Rourk-Schatzki's ring status post dilation, hiatal hernia  . Esophagogastroduodenoscopy  07/12/2007    Dilation with 56 French, Schatzki's ring  . Esophagogastroduodenoscopy (egd) with esophageal dilation  09/01/2012    Dr. Gala Romney- schatzki's ring, hiatal hernia  . Colonoscopy N/A 10/20/2013    Procedure: COLONOSCOPY;   Surgeon: Daneil Dolin, MD;  [facility];  Service: Endoscopy;  Laterality: N/A;  12:45  . Foot arthrodesis  2007,2010    both feet   History   Social History  . Marital Status: Married    Spouse Name: N/A    Number of Children: 3  . Years of Education: N/A   Occupational History  . 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 Smoking status: Former Smoker -- 1.00 packs/day for 30 years    Types: Cigarettes    Quit date: 08/29/2013  . Smokeless tobacco: Never Used  . Alcohol Use: No  . Drug Use: No  . Sexual Activity: Yes    Partners: Male    Birth Control/ Protection: None     Comment: spouse   Other Topics Concern  . None   Social History Narrative  . None   Family History  Problem Relation Age of Onset  . Colon cancer Neg Hx    Allergies  Allergen Reactions  . Aspirin Other (See Comments)    Affects the central nervous system. "shakey"  . Cefprozil Other (See Comments)    Messes with Ulcerative colitis   . Cefuroxime Axetil Other (See Comments)    Messes with Ulcerative colitis   . Clarithromycin Nausea And Vomiting  . Codeine Other (See Comments)    Hallucinations/bad dreams.  . Entex Other (See Comments)    insomnia  . Mesalamine  Other (See Comments)    Pancreatitis   . Morphine And Related Hives  . Penicillins Hives   Prior to Admission medications   Medication Sig Start Date End Date Taking? Authorizing Provider  albuterol (PROVENTIL) (2.5 MG/3ML) 0.083% nebulizer solution Take 2.5 mg by nebulization every 6 (six) hours as needed for wheezing or shortness of breath.   Yes Historical Provider, MD  albuterol (PROVENTIL,VENTOLIN) 90 MCG/ACT inhaler Inhale 2 puffs into the lungs every 4 (four) hours as needed.     Yes Historical Provider, MD  aspirin 81 MG tablet Take 81 mg by mouth daily.     Yes Historical Provider, MD  azelastine (ASTELIN) 137 MCG/SPRAY nasal spray 2 sprays by Nasal route 2 (two) times daily. Use in each  nostril as directed    Yes Historical Provider, MD  balsalazide (COLAZAL) 750 MG capsule Take 3 capsules (2,250 mg total) by mouth 2 (two) times daily. 04/12/13  Yes Daneil Dolin, MD  buPROPion [employer] SR) 150 MG 12 hr tablet Take 150 mg by mouth daily.   Yes Historical Provider, MD  busPIRone (BUSPAR) 15 MG tablet Take 7.5 mg by mouth 2 (two) times daily. 1/2 tablet bid   Yes Historical Provider, MD  Calcium Carbonate-Vitamin D (CALCIUM + D PO) Take 1 tablet by mouth 2 (two) times daily.     Yes Historical Provider, MD  cetirizine (ZYRTEC) 10 MG chewable tablet Chew 10 mg by mouth daily.     Yes Historical Provider, MD  DULoxetine (CYMBALTA) 60 MG capsule Take 60 mg by mouth daily.     Yes Historical Provider, MD  ferrous gluconate (FERGON) 225 (27 FE) MG tablet Take 240 mg by mouth 3 (three) times daily with meals.     Yes Historical Provider, MD  Fluticasone-Salmeterol (ADVAIR DISKUS) 100-50 MCG/DOSE AEPB Inhale 1 puff into the lungs every 12 (twelve) hours.     Yes Historical Provider, MD  ketotifen (ALAWAY) 0.025 % ophthalmic solution Place 2 drops into both eyes 2 (two) times daily.   Yes Historical Provider, MD  metFORMIN (GLUCOPHAGE-XR) 500 MG 24 hr tablet Take 1,000 mg by mouth daily with breakfast.   Yes Historical Provider, MD  NEXIUM 40 MG capsule TAKE ONE CAPSULE EVERY DAY BEFORE BREAKFAST   Yes Orvil Feil, NP  pregabalin (LYRICA) 75 MG capsule Take 75 mg by mouth 2 (two) times daily.   Yes Historical Provider, MD  traZODone (DESYREL) 100 MG tablet Take 100 mg by mouth at bedtime.     Yes Historical Provider, MD     Positive ROS: All other systems have been reviewed and were otherwise negative with the exception of those mentioned in the HPI and as above.  Physical Exam: General: Alert, no acute distress Cardiovascular: No pedal edema Respiratory: No cyanosis, no use of accessory musculature GI: No organomegaly, abdomen is soft and non-tender Skin: No lesions in the area of  chief complaint Neurologic: Sensation intact distally Psychiatric: Patient is competent for consent with normal mood and affect Lymphatic: No axillary or cervical lymphadenopathy  MUSCULOSKELETAL: Right knee has mild effusion, lateral joint line tenderness, 0-120 with some mild valgus alignment.  Assessment: RIGHT KNEE TEAR MENISCUS LATERAL WITH EARLY DEGENERATIVE CHANGES.  Plan: Plan for Procedure(s): RIGHT KNEE ARTHROSCOPY WITH LATERAL MENISECTOMY  The risks benefits and alternatives were discussed with the patient including but not limited to the risks of nonoperative treatment, versus surgical intervention including infection, bleeding, nerve injury,  blood clots, cardiopulmonary complications, morbidity, mortality, among others,  and they were willing to proceed.   Johnny Bridge, MD Cell [phone]   01/27/2014 7:17 AM

## 2014-01-27 NOTE — Anesthesia Preprocedure Evaluation (Signed)
Anesthesia Evaluation  Patient identified by MRN, date of birth, ID band Patient awake    Reviewed: Allergy & Precautions, H&P , NPO status , Patient's Chart, lab work & pertinent test results  Airway Mallampati: II TM Distance: >3 FB Neck ROM: Full    Dental no notable dental hx. (+) Edentulous Upper, Partial Lower, Dental Advisory Given   Pulmonary sleep apnea and Continuous Positive Airway Pressure Ventilation , COPD COPD inhaler, former smoker,  breath sounds clear to auscultation  Pulmonary exam normal       Cardiovascular negative cardio ROS  Rhythm:Regular Rate:Normal     Neuro/Psych PSYCHIATRIC DISORDERS negative neurological ROS     GI/Hepatic Neg liver ROS, PUD, GERD-  Medicated and Controlled,  Endo/Other  negative endocrine ROS  Renal/GU negative Renal ROS  negative genitourinary   Musculoskeletal   Abdominal   Peds  Hematology negative hematology ROS (+)   Anesthesia Other Findings   Reproductive/Obstetrics negative OB ROS                           Anesthesia Physical Anesthesia Plan  ASA: III  Anesthesia Plan: General   Post-op Pain Management:    Induction: Intravenous  Airway Management Planned: LMA  Additional Equipment:   Intra-op Plan:   Post-operative Plan: Extubation in OR  Informed Consent: I have reviewed the patients History and Physical, chart, labs and discussed the procedure including the risks, benefits and alternatives for the proposed anesthesia with the patient or authorized representative who has indicated his/her understanding and acceptance.   Dental advisory given  Plan Discussed with: CRNA  Anesthesia Plan Comments:         Anesthesia Quick Evaluation

## 2014-01-30 ENCOUNTER — Encounter (HOSPITAL_BASED_OUTPATIENT_CLINIC_OR_DEPARTMENT_OTHER): Payer: Self-pay | Admitting: Orthopedic Surgery

## 2014-04-20 ENCOUNTER — Other Ambulatory Visit: Payer: Self-pay | Admitting: Internal Medicine

## 2014-04-24 NOTE — Telephone Encounter (Signed)
Pt called stating her pharmacy has sent over a refill request for colozole. Please advise [phone] pt said her pharmacy has not heard anything back.

## 2014-05-01 ENCOUNTER — Encounter: Payer: Self-pay | Admitting: Cardiology

## 2014-06-01 ENCOUNTER — Encounter: Payer: Self-pay | Admitting: Cardiology

## 2014-06-28 ENCOUNTER — Other Ambulatory Visit: Payer: Self-pay

## 2014-06-28 MED ORDER — BALSALAZIDE DISODIUM 750 MG PO CAPS: 2250.0000 mg | ORAL_CAPSULE | Freq: Two times a day (BID) | ORAL | Status: DC

## 2014-06-28 MED ORDER — ESOMEPRAZOLE MAGNESIUM 40 MG PO CPDR
40.0000 mg | DELAYED_RELEASE_CAPSULE | Freq: Every day | ORAL | Status: DC
Start: 2014-06-28 — End: 2014-06-30

## 2014-06-30 ENCOUNTER — Other Ambulatory Visit: Payer: Self-pay

## 2014-07-03 MED ORDER — ESOMEPRAZOLE MAGNESIUM 40 MG PO CPDR
40.0000 mg | DELAYED_RELEASE_CAPSULE | Freq: Every day | ORAL | Status: DC
Start: 2014-07-03 — End: 2014-07-13

## 2014-07-04 ENCOUNTER — Encounter: Payer: Self-pay | Admitting: Cardiology

## 2014-07-04 DIAGNOSIS — L570 Actinic keratosis: Secondary | ICD-10-CM | POA: Diagnosis not present

## 2014-07-04 DIAGNOSIS — J4520 Mild intermittent asthma, uncomplicated: Secondary | ICD-10-CM | POA: Diagnosis not present

## 2014-07-04 DIAGNOSIS — F5101 Primary insomnia: Secondary | ICD-10-CM | POA: Diagnosis not present

## 2014-07-04 DIAGNOSIS — G4733 Obstructive sleep apnea (adult) (pediatric): Secondary | ICD-10-CM | POA: Diagnosis not present

## 2014-07-04 DIAGNOSIS — E1165 Type 2 diabetes mellitus with hyperglycemia: Secondary | ICD-10-CM | POA: Diagnosis not present

## 2014-07-04 DIAGNOSIS — R072 Precordial pain: Secondary | ICD-10-CM | POA: Diagnosis not present

## 2014-07-04 DIAGNOSIS — Z23 Encounter for immunization: Secondary | ICD-10-CM | POA: Diagnosis not present

## 2014-07-04 DIAGNOSIS — K219 Gastro-esophageal reflux disease without esophagitis: Secondary | ICD-10-CM | POA: Diagnosis not present

## 2014-07-06 ENCOUNTER — Ambulatory Visit (INDEPENDENT_AMBULATORY_CARE_PROVIDER_SITE_OTHER): Payer: Medicare Other | Admitting: Cardiology

## 2014-07-06 ENCOUNTER — Encounter: Payer: Self-pay | Admitting: *Deleted

## 2014-07-06 ENCOUNTER — Encounter: Payer: Self-pay | Admitting: Cardiology

## 2014-07-06 VITALS — BP 120/75 | HR 86 | Ht 64.0 in | Wt 185.8 lb

## 2014-07-06 DIAGNOSIS — R079 Chest pain, unspecified: Secondary | ICD-10-CM

## 2014-07-06 NOTE — Progress Notes (Signed)
Clinical Summary [name] is a 63 y.o.female seen today as a new patient for the following medical problems.  1. Chest pain - started approx 1 year ago. Sharp pain right chest to midchest, 5-6/10. Can occur at rest or with exertion. No other associated symtoms. Pains lasts approx 4 seconds. Occurs 1-2 times a week. Unsure in change frequency, stable severity - DOE at 1 block. Occas LE edema, no orthopnea, no PND - reports prior myalgias and fatigue on lipitor  CAD risk factors: DM, HL, tobacco   Past Medical History  Diagnosis Date  . Depression   . Ulcerative colitis 2007  . GERD (gastroesophageal reflux disease)   . Pancreatitis   . COPD (chronic obstructive pulmonary disease)   . Diverticulitis   . Sleep apnea   . Schatzki's ring   . DDD (degenerative disc disease)   . Chronic back pain   . Arthritis   . PVC's (premature ventricular contractions)   . Hiatal hernia   . Wears dentures     full top-partial bottom  . HOH (hard of hearing)     right  . 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 Aspirin Other (See Comments)    Affects the central nervous system. "shakey"  . Cefprozil Other (See Comments)    Messes with Ulcerative colitis   . Cefuroxime Axetil Other (See Comments)    Messes with Ulcerative colitis   . Clarithromycin Nausea And Vomiting  . Codeine Other (See Comments)    Hallucinations/bad dreams.  . Entex Other (See Comments)    insomnia  . Mesalamine Other (See Comments)    Pancreatitis   . Morphine And Related Hives  . Penicillins Hives     Current Outpatient Prescriptions  Medication Sig Dispense Refill  . albuterol (PROVENTIL) (2.5 MG/3ML) 0.083% nebulizer solution Take 2.5 mg by nebulization every 6 (six) hours as needed for wheezing or shortness of breath.      Marland Kitchen albuterol (PROVENTIL,VENTOLIN) 90 MCG/ACT inhaler Inhale 2 puffs into the lungs every 4 (four)  hours as needed.        Marland Kitchen aspirin 81 MG tablet Take 81 mg by mouth daily.        Marland Kitchen azelastine (ASTELIN) 137 MCG/SPRAY nasal spray 2 sprays by Nasal route 2 (two) times daily. Use in each nostril as directed       . balsalazide (COLAZAL) 750 MG capsule Take 3 capsules (2,250 mg total) by mouth 2 (two) times daily.  540 capsule  3  . buPROPion (WELLBUTRIN SR) 150 MG 12 hr tablet Take 150 mg by mouth daily.      . busPIRone (BUSPAR) 15 MG tablet Take 7.5 mg by mouth 2 (two) times daily. 1/2 tablet bid      . Calcium Carbonate-Vitamin D (CALCIUM + D PO) Take 1 tablet by mouth 2 (two) times daily.        . cetirizine (ZYRTEC) 10 MG chewable tablet Chew 10 mg by mouth daily.        . DULoxetine (CYMBALTA) 60 MG capsule Take 60 mg by mouth daily.        Marland Kitchen esomeprazole (NEXIUM) 40 MG capsule Take 1 capsule (40 mg total) by mouth daily before breakfast.  30 capsule  5  . ferrous gluconate (FERGON) 225 (27 FE) MG tablet Take 240 mg by mouth 3 (three) times daily with meals.        Marland Kitchen  Fluticasone-Salmeterol (ADVAIR DISKUS) 100-50 MCG/DOSE AEPB Inhale 1 puff into the lungs every 12 (twelve) hours.        Marland Kitchen ketotifen (ALAWAY) 0.025 % ophthalmic solution Place 2 drops into both eyes 2 (two) times daily.      . metFORMIN (GLUCOPHAGE-XR) 500 MG 24 hr tablet Take 1,000 mg by mouth daily with breakfast.      . oxyCODONE-acetaminophen (ROXICET) 5-325 MG per tablet Take 1-2 tablets by mouth every 6 (six) hours as needed for severe pain.  75 tablet  0  . pregabalin (LYRICA) 75 MG capsule Take 75 mg by mouth 2 (two) times daily.      . promethazine (PHENERGAN) 25 MG tablet Take 1 tablet (25 mg total) by mouth every 6 (six) hours as needed for nausea or vomiting.  30 tablet  0  . sennosides-docusate sodium (SENOKOT-S) 8.6-50 MG tablet Take 2 tablets by mouth daily.  30 tablet  1  . traZODone (DESYREL) 100 MG tablet Take 100 mg by mouth at bedtime.         No current facility-administered medications for this visit.      Past Surgical History  Procedure Laterality Date  . Tonsillectomy    . Dilation and curettage of uterus    . Hernia repair    . Cholecystectomy    . Cervical cone biopsy    . Arch and foot    . Esophagogastroduodenoscopy  01/28/2011    Rourk- Schatzki ring, s/p 48F, otherwise normal/Hiatal hernia otherwise normal stomach D1 and D2  . Colonoscopy  12/03/05    Rourk-marked inflammatory changes of the rectum, left colon, pan colitis, internal hemorrhoids, sigmoid diverticula  . Esophagogastroduodenoscopy  11/30/08    Rourk-Schatzki's ring status post dilation, hiatal hernia  . Esophagogastroduodenoscopy  07/12/2007    Dilation with 56 French, Schatzki's ring  . Esophagogastroduodenoscopy (egd) with esophageal dilation  09/01/2012    Dr. Gala Romney- schatzki's ring, hiatal hernia  . Colonoscopy N/A 10/20/2013    Procedure: COLONOSCOPY;  Surgeon: Daneil Dolin, MD;  [facility];  Service: Endoscopy;  Laterality: N/A;  12:45  . Foot arthrodesis  [id number]    both feet  . Knee arthroscopy with lateral menisectomy Right 01/27/2014    Procedure: RIGHT KNEE ARTHROSCOPY WITH PARTIAL LATERAL MENISECTOMY, ;  Surgeon: Johnny Bridge, MD;  Location: [place];  Service: Orthopedics;  Laterality: Right;     Allergies  Allergen Reactions  . Aspirin Other (See Comments)    Affects the central nervous system. "shakey"  . Cefprozil Other (See Comments)    Messes with Ulcerative colitis   . Cefuroxime Axetil Other (See Comments)    Messes with Ulcerative colitis   . Clarithromycin Nausea And Vomiting  . Codeine Other (See Comments)    Hallucinations/bad dreams.  . Entex Other (See Comments)    insomnia  . Mesalamine Other (See Comments)    Pancreatitis   . Morphine And Related Hives  . Penicillins Hives      Family History  Problem Relation Age of Onset  . Colon cancer Neg Hx      Social History Ms. Eastmond reports that she quit smoking about 10 months ago.  Her smoking use included Cigarettes. She has a 30 pack-year smoking history. She has never used smokeless tobacco. Ms. Gonsalves reports that she does not drink alcohol.   Review of Systems CONSTITUTIONAL: No weight loss, fever, chills, weakness or fatigue.  HEENT: Eyes: No visual loss, blurred vision, double vision  or yellow sclerae.No hearing loss, sneezing, congestion, runny nose or sore throat.  SKIN: No rash or itching.  CARDIOVASCULAR: per HPI RESPIRATORY: No shortness of breath, cough or sputum.  GASTROINTESTINAL: No anorexia, nausea, vomiting or diarrhea. No abdominal pain or blood.  GENITOURINARY: No burning on urination, no polyuria NEUROLOGICAL: No headache, dizziness, syncope, paralysis, ataxia, numbness or tingling in the extremities. No change in bowel or bladder control.  MUSCULOSKELETAL: No muscle, back pain, joint pain or stiffness.  LYMPHATICS: No enlarged nodes. No history of splenectomy.  PSYCHIATRIC: No history of depression or anxiety.  ENDOCRINOLOGIC: No reports of sweating, cold or heat intolerance. No polyuria or polydipsia.  Marland Kitchen   Physical Examination p 86 bp 120/75 Wt 185 lbs BMI 32 Gen: resting comfortably, no acute distress HEENT: no scleral icterus, pupils equal round and reactive, no palptable cervical adenopathy,  CV: RRR, no m/r/g, no JVD, no carotid bruits Resp: Clear to auscultation bilaterally GI: abdomen is soft, non-tender, non-distended, normal bowel sounds, no hepatosplenomegaly MSK: extremities are warm, no edema.  Skin: warm, no rash Neuro:  no focal deficits Psych: appropriate affect   Diagnostic Studies 07/04/14 EKG SR, diffuse ST depressions and TWI precordial leads    Assessment and Plan  1. Chest pain - unclear etiology, she has multiple CAD risk factors and an abnormal baseline EKG. - obtain Lexiscan MPI, she has chronic severe left ankle pain and is not able to run on treadmill.   F/u 1 month   Arnoldo Lenis, M.D.

## 2014-07-06 NOTE — Patient Instructions (Signed)
There were no changes to your medications. Continue as directed. Your physician has requested that you have a lexiscan myoview. For further information please visit [email or web address]. Please follow instruction sheet, as given. Office will contact with results via phone or letter.   Your physician wants you to follow up in 1 month.

## 2014-07-11 ENCOUNTER — Telehealth: Payer: Self-pay | Admitting: Internal Medicine

## 2014-07-11 NOTE — Telephone Encounter (Signed)
PATIENT HAS QUESTIONS REGARDING PRESCRIPTION.  [facility] FAXED PAPERS HERE AND PATIENT HAS NOT YET GOTTEN REFILLS.  TOLD PATIENT TO CALL HERE.    [place]

## 2014-07-12 NOTE — Telephone Encounter (Signed)
[facility]al was sent to [facility] on 06/28/14. nexium was refilled on 07/03/14 but sent to [facility] in [place], which is the wrong pharmacy. [facility] needs [facility].

## 2014-07-13 MED ORDER — ESOMEPRAZOLE MAGNESIUM 40 MG PO CPDR: 40.0000 mg | DELAYED_RELEASE_CAPSULE | Freq: Every day | ORAL | Status: DC

## 2014-07-13 NOTE — Telephone Encounter (Signed)
I called pt- she has not received colozal yet. I [facility] and they have already mailed it out to her and she should receive it on Saturday. I called pt back and she said they told her that they didn't have an Rx for colozal.   Pt also asked if we accepted [place]. I check with Rosendo Gros (she checked with the billing dept) and we do not take [place]. Pt said she is 80 now and has [place] and will have to transfer her records to [facility] in [place]. I asked pt to come by and sign a release and we can fax those for her. Pt said she will come by one day next week to sign and Manuela Schwartz is aware.

## 2014-07-13 NOTE — Addendum Note (Signed)
Addended by: Orvil Feil on: 07/13/2014 08:57 AM   Modules accepted: Orders

## 2014-07-13 NOTE — Telephone Encounter (Signed)
Done

## 2014-07-14 ENCOUNTER — Ambulatory Visit (HOSPITAL_COMMUNITY)
Admission: RE | Admit: 2014-07-14 | Discharge: 2014-07-14 | Disposition: A | Discharge status: Home / Self Care | Payer: Medicare Other | Source: Ambulatory Visit | Attending: Cardiology | Admitting: Cardiology

## 2014-07-14 ENCOUNTER — Encounter (HOSPITAL_COMMUNITY): Payer: Self-pay

## 2014-07-14 ENCOUNTER — Encounter (HOSPITAL_COMMUNITY)
Admission: RE | Admit: 2014-07-14 | Discharge: 2014-07-14 | Disposition: A | Discharge status: Home / Self Care | Payer: Medicare Other | Source: Ambulatory Visit | Attending: Cardiology | Admitting: Cardiology

## 2014-07-14 DIAGNOSIS — R079 Chest pain, unspecified: Secondary | ICD-10-CM | POA: Insufficient documentation

## 2014-07-14 MED ORDER — TECHNETIUM TC 99M SESTAMIBI - CARDIOLITE
30.0000 | Freq: Once | INTRAVENOUS | Status: AC | PRN
  Administered 2014-07-14: 30 via INTRAVENOUS

## 2014-07-14 MED ORDER — SODIUM CHLORIDE 0.9 % IJ SOLN
10.0000 mL | INTRAMUSCULAR | Status: DC | PRN
  Administered 2014-07-14: 10 mL via INTRAVENOUS

## 2014-07-14 MED ORDER — SODIUM CHLORIDE 0.9 % IJ SOLN
INTRAMUSCULAR | Status: AC
  Administered 2014-07-14: 10 mL via INTRAVENOUS
  Filled 2014-07-14: qty 10

## 2014-07-14 MED ORDER — REGADENOSON 0.4 MG/5ML IV SOLN
INTRAVENOUS | Status: AC
  Administered 2014-07-14: 0.4 mg via INTRAVENOUS
  Filled 2014-07-14: qty 5

## 2014-07-14 MED ORDER — TECHNETIUM TC 99M SESTAMIBI GENERIC - CARDIOLITE
10.0000 | Freq: Once | INTRAVENOUS | Status: AC | PRN
  Administered 2014-07-14: 10 via INTRAVENOUS

## 2014-07-14 MED ORDER — REGADENOSON 0.4 MG/5ML IV SOLN
0.4000 mg | Freq: Once | INTRAVENOUS | Status: AC | PRN
  Administered 2014-07-14: 0.4 mg via INTRAVENOUS

## 2014-07-14 NOTE — Progress Notes (Signed)
Stress Lab Nurses Notes - [name]  [name] 07/14/2014 Reason for doing test: Chest Pain Type of test: [name] Nurse performing test: Gerrit Halls, RN Nuclear Medicine Tech: Redmond Baseman Echo Tech: Not Applicable MD performing test: Gaynelle Cage MD: Maryla Morrow explained and consent signed: Yes.   IV started: Saline lock flushed, No redness or edema and Saline lock started in radiology Symptoms: headache Treatment/Intervention: None Reason test stopped: protocol completed After recovery IV was: Discontinued via X-ray tech and No redness or edema Patient to return to Nuc. Med at : 10:15 Patient discharged: Home Patient's Condition upon discharge was: stable Comments: During test BP 133/64 & HR 94.  Recovery BP 134/66 & HR 87.  Symptoms resolved in recovery.  [name] T

## 2014-07-17 ENCOUNTER — Telehealth: Payer: Self-pay | Admitting: *Deleted

## 2014-07-17 DIAGNOSIS — H8111 Benign paroxysmal vertigo, right ear: Secondary | ICD-10-CM | POA: Diagnosis not present

## 2014-07-17 DIAGNOSIS — J0120 Acute ethmoidal sinusitis, unspecified: Secondary | ICD-10-CM | POA: Diagnosis not present

## 2014-07-17 NOTE — Telephone Encounter (Signed)
Pt informed

## 2014-07-17 NOTE — Telephone Encounter (Signed)
Message copied by Orion Modest on Mon Jul 17, 2014 11:42 AM ------      Message from: Kaser F      Created: Mon Jul 17, 2014 11:33 AM       Stress test is normal, no evdidence of any blockages. Will discuss further at our follow up            Zandra Abts MD             ------

## 2014-07-17 NOTE — Telephone Encounter (Signed)
Left message for pt to return call.

## 2014-07-31 ENCOUNTER — Telehealth: Payer: Self-pay | Admitting: Internal Medicine

## 2014-07-31 NOTE — Telephone Encounter (Signed)
I spoke with the pt. She said she can swallow liquids and her saliva but she is having problems with pills and food. We moved her appt to Thursday with [facility] and I told pt that she should do liquids and soft foods and if she got worse and couldn't swallow anything, she should go to the ED. Pt stated that was fine.

## 2014-07-31 NOTE — Telephone Encounter (Signed)
PATIENT CALLED STATING SHE IS HAVING DIFFICULTY SWALLOWING AGAIN AND i MADE HER AN APPOINTMENT WITH THE FIRST AVAILABLE.  SHE STATED THAT SHE IS ABLE TO SWALLOW SOFT FOOD AND SALIVA. PLEASE ADVISE IF THERE IS ANYTHING ELSE SHE NEEDS TO DO

## 2014-07-31 NOTE — Telephone Encounter (Signed)
Agree 

## 2014-08-03 ENCOUNTER — Ambulatory Visit (INDEPENDENT_AMBULATORY_CARE_PROVIDER_SITE_OTHER): Payer: Medicare Other | Admitting: Gastroenterology

## 2014-08-03 ENCOUNTER — Other Ambulatory Visit: Payer: Self-pay

## 2014-08-03 ENCOUNTER — Encounter: Payer: Self-pay | Admitting: Gastroenterology

## 2014-08-03 VITALS — BP 124/79 | HR 85 | Temp 98.0°F | Ht 64.0 in | Wt 189.0 lb

## 2014-08-03 DIAGNOSIS — K5190 Ulcerative colitis, unspecified, without complications: Secondary | ICD-10-CM

## 2014-08-03 DIAGNOSIS — R1310 Dysphagia, unspecified: Secondary | ICD-10-CM

## 2014-08-03 NOTE — Assessment & Plan Note (Signed)
63 year old female with history of Schatzki's ring requiring multiple dilations in the past, with most recent in 2013. Now with recurrent dysphagia, intermittent odynophagia. Differentials including ring, web, stricture, possible esophagitis as culprit. Needs EGD with dilation in near future.   Proceed with upper endoscopy and dilation in the near future with Dr. Gala Romney. The risks, benefits, and alternatives have been discussed in detail with patient. They have stated understanding and desire to proceed.  Continue PPI daily Consider BPE if persistent dysphagia after EGD

## 2014-08-03 NOTE — Assessment & Plan Note (Signed)
Colonoscopy up-to-date as of Jan 2015. Maintained on Colazal. No concerning lower GI symptoms. Next colonoscopy 2020.

## 2014-08-03 NOTE — Progress Notes (Signed)
Referring Provider: Caryl Bis, MD Primary Care Physician:  Gar Ponto, MD  Primary GI: Dr. Gala Romney   Chief Complaint  Patient presents with  . Dysphagia    HPI:   [name] presents today secondary to recurrent dysphagia. History of UC, with last colonoscopy in Jan 2015. Due for surveillance in 2020.  Multiple dilations in the past secondary to Schatzki's ring, most recently 2013. Symptom onset about a month ago. Mild at first, now worsening. Soft foods ok. Difficulty with liquids if some other food item already lodged. Intermittent odynophagia. Feels like reflux is acting up. When food gets stuck, has to regurgitate up, sometimes has to cough it up. Pill dysphagia. Trazodone, Vit D gets stuck. Pepcid as needed. No weight loss or lack of appetite.   No lower GI symptoms of concern.    Past Medical History  Diagnosis Date  . Depression   . Ulcerative colitis 2007  . GERD (gastroesophageal reflux disease)   . Pancreatitis   . COPD (chronic obstructive pulmonary disease)   . Diverticulitis   . Sleep apnea   . Schatzki's ring   . DDD (degenerative disc disease)   . Chronic back pain   . Arthritis   . PVC's (premature ventricular contractions)   . Hiatal hernia   . Wears dentures     full top-partial bottom  . HOH (hard of hearing)     right  . Degenerative tear of posterior horn of lateral meniscus of right knee 01/27/2014    Past Surgical History  Procedure Laterality Date  . Tonsillectomy    . Dilation and curettage of uterus    . Hernia repair    . Cholecystectomy    . Cervical cone biopsy    . Arch and foot    . Esophagogastroduodenoscopy  01/28/2011    Rourk- Schatzki ring, s/p 29F, otherwise normal/Hiatal hernia otherwise normal stomach D1 and D2  . Colonoscopy  12/03/05    Rourk-marked inflammatory changes of the rectum, left colon, pan colitis, internal hemorrhoids, sigmoid diverticula  . Esophagogastroduodenoscopy  11/30/08   Rourk-Schatzki's ring status post dilation, hiatal hernia  . Esophagogastroduodenoscopy  07/12/2007    Dilation with 56 French, Schatzki's ring  . Esophagogastroduodenoscopy (egd) with esophageal dilation  09/01/2012    Dr. Gala Romney- schatzki's ring, hiatal hernia  . Colonoscopy N/A 10/20/2013    Dr. Rourk:Colonic diverticulosis. colonic mucosal ulcerations involving segment of sigmoid colon, atypical for UC. sigmoid colon path with chronic mildly active colitis consistent with IBD, other biopsies including ascending, transverse, descending colon and rectum unremarkable.   . Foot arthrodesis  [id number]    both feet  . Knee arthroscopy with lateral menisectomy Right 01/27/2014    Procedure: RIGHT KNEE ARTHROSCOPY WITH PARTIAL LATERAL MENISECTOMY, ;  Surgeon: Johnny Bridge, MD;  Location: [facility];  Service: Orthopedics;  Laterality: Right;    Current Outpatient Prescriptions  Medication Sig Dispense Refill  . albuterol (PROVENTIL) (2.5 MG/3ML) 0.083% nebulizer solution Take 2.5 mg by nebulization every 6 (six) hours as needed for wheezing or shortness of breath.    Marland Kitchen albuterol (PROVENTIL,VENTOLIN) 90 MCG/ACT inhaler Inhale 2 puffs into the lungs every 4 (four) hours as needed.      Marland Kitchen aspirin 81 MG tablet Take 81 mg by mouth daily.      Marland Kitchen azelastine (ASTELIN) 137 MCG/SPRAY nasal spray 2 sprays by Nasal route 2 (two) times daily. Use in each nostril as directed     .  balsalazide (COLAZAL) 750 MG capsule Take 3 capsules (2,250 mg total) by mouth 2 (two) times daily. 540 capsule 3  . buPROPion (WELLBUTRIN SR) 150 MG 12 hr tablet Take 150 mg by mouth daily.    . busPIRone (BUSPAR) 15 MG tablet Take 7.5 mg by mouth 2 (two) times daily. 1/2 tablet bid    . Calcium Carb-Cholecalciferol (CALCIUM-VITAMIN D) 600-400 MG-UNIT TABS Take 1 tablet by mouth 2 (two) times daily.    . cetirizine (ZYRTEC) 10 MG chewable tablet Chew 10 mg by mouth daily.      . DULoxetine (CYMBALTA) 60 MG capsule Take  60 mg by mouth daily.      Marland Kitchen esomeprazole (NEXIUM) 40 MG capsule Take 1 capsule (40 mg total) by mouth daily before breakfast. 30 capsule 5  . famotidine-calcium carbonate-magnesium hydroxide (PEPCID COMPLETE) 10-800-165 MG CHEW chewable tablet Chew 1 tablet by mouth daily as needed.    . ferrous gluconate (FERGON) 225 (27 FE) MG tablet Take 240 mg by mouth daily.     . Fluticasone-Salmeterol (ADVAIR DISKUS) 100-50 MCG/DOSE AEPB Inhale 1 puff into the lungs every 12 (twelve) hours.      Marland Kitchen ketotifen (ALAWAY) 0.025 % ophthalmic solution Place 2 drops into both eyes 2 (two) times daily.    . metFORMIN (GLUCOPHAGE-XR) 500 MG 24 hr tablet Take 1,000 mg by mouth every evening.     . methylcellulose (CITRUCEL) oral powder Take 1 packet by mouth 2 (two) times daily.    . pregabalin (LYRICA) 75 MG capsule Take 75 mg by mouth 2 (two) times daily.    . traMADol (ULTRAM-ER) 100 MG 24 hr tablet Take 100 mg by mouth at bedtime.    . traZODone (DESYREL) 100 MG tablet Take 100 mg by mouth at bedtime.       No current facility-administered medications for this visit.    Allergies as of 08/03/2014 - Review Complete 08/03/2014  Allergen Reaction Noted  . Aspirin Other (See Comments)   . Cefprozil Other (See Comments)   . Cefuroxime axetil Other (See Comments) 08/08/2008  . Clarithromycin Nausea And Vomiting 08/08/2008  . Codeine Other (See Comments)   . Entex Other (See Comments) 08/08/2008  . Mesalamine Other (See Comments)   . Morphine and related Hives 08/23/2012  . Penicillins Hives 08/08/2008    Family History  Problem Relation Age of Onset  . Colon cancer Neg Hx     History   Social History  . Marital Status: Married    Spouse Name: N/A    Number of Children: 3  . Years of Education: N/A   Occupational History  . 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 Smoking status: Former Smoker -- 1.00 packs/day for 30 years    Types: Cigarettes    Quit date: 08/29/2013  .  Smokeless tobacco: Never Used  . Alcohol Use: No  . Drug Use: No  . Sexual Activity:    Partners: Male    Birth Control/ Protection: None     Comment: spouse   Other Topics Concern  . None   Social History Narrative    Review of Systems: As mentioned in HPI  Physical Exam: BP 124/79 mmHg  Pulse 85  Temp(Src) 98 F (36.7 C) (Oral)  Ht 5' 4"  (1.626 m)  Wt 189 lb (85.73 kg)  BMI 32.43 kg/m2 General:   Alert and oriented. No distress noted. Pleasant and cooperative.  Head:  Normocephalic and atraumatic. Eyes:  Conjuctiva  clear without scleral icterus. Mouth:  Oral mucosa pink and moist. Good dentition. No lesions. Neck:  Supple, without mass or thyromegaly. Heart:  S1, S2 present without murmurs, rubs, or gallops. Regular rate and rhythm. Abdomen:  +BS, soft, non-tender and non-distended. No rebound or guarding. No HSM or masses noted. Msk:  Symmetrical without gross deformities. Normal posture. Extremities:  Without edema. Neurologic:  Alert and  oriented x4;  grossly normal neurologically. Skin:  Intact without significant lesions or rashes. Psych:  Alert and cooperative. Normal mood and affect.

## 2014-08-03 NOTE — Patient Instructions (Signed)
We have scheduled you for an upper endoscopy with dilation with Dr. Gala Romney in the near future.  Continue to chew well, take small bites, and sit upright with eating.

## 2014-08-04 NOTE — Progress Notes (Signed)
cc'ed to pcp °

## 2014-08-10 ENCOUNTER — Encounter (HOSPITAL_COMMUNITY): Payer: Self-pay | Admitting: *Deleted

## 2014-08-10 ENCOUNTER — Ambulatory Visit (HOSPITAL_COMMUNITY)
Admission: RE | Admit: 2014-08-10 | Discharge: 2014-08-10 | Disposition: A | Discharge status: Home / Self Care | Payer: Medicare Other | Source: Ambulatory Visit | Attending: Internal Medicine | Admitting: Internal Medicine

## 2014-08-10 ENCOUNTER — Encounter (HOSPITAL_COMMUNITY)
Admission: RE | Disposition: A | Discharge status: Home / Self Care | Payer: Self-pay | Source: Ambulatory Visit | Attending: Internal Medicine

## 2014-08-10 DIAGNOSIS — K219 Gastro-esophageal reflux disease without esophagitis: Secondary | ICD-10-CM | POA: Insufficient documentation

## 2014-08-10 DIAGNOSIS — Z79899 Other long term (current) drug therapy: Secondary | ICD-10-CM | POA: Insufficient documentation

## 2014-08-10 DIAGNOSIS — R1310 Dysphagia, unspecified: Secondary | ICD-10-CM | POA: Diagnosis not present

## 2014-08-10 DIAGNOSIS — G4730 Sleep apnea, unspecified: Secondary | ICD-10-CM | POA: Insufficient documentation

## 2014-08-10 DIAGNOSIS — K449 Diaphragmatic hernia without obstruction or gangrene: Secondary | ICD-10-CM | POA: Insufficient documentation

## 2014-08-10 DIAGNOSIS — F329 Major depressive disorder, single episode, unspecified: Secondary | ICD-10-CM | POA: Diagnosis not present

## 2014-08-10 DIAGNOSIS — Z7982 Long term (current) use of aspirin: Secondary | ICD-10-CM | POA: Diagnosis not present

## 2014-08-10 DIAGNOSIS — Q394 Esophageal web: Secondary | ICD-10-CM | POA: Diagnosis not present

## 2014-08-10 DIAGNOSIS — R1319 Other dysphagia: Secondary | ICD-10-CM | POA: Diagnosis present

## 2014-08-10 DIAGNOSIS — K222 Esophageal obstruction: Secondary | ICD-10-CM | POA: Diagnosis not present

## 2014-08-10 DIAGNOSIS — J449 Chronic obstructive pulmonary disease, unspecified: Secondary | ICD-10-CM | POA: Diagnosis not present

## 2014-08-10 HISTORY — PX: MALONEY DILATION: SHX5535

## 2014-08-10 HISTORY — PX: SAVORY DILATION: SHX5439

## 2014-08-10 HISTORY — DX: Other specified postprocedural states: Z98.890

## 2014-08-10 HISTORY — PX: ESOPHAGOGASTRODUODENOSCOPY: SHX5428

## 2014-08-10 HISTORY — DX: Nausea with vomiting, unspecified: R11.2

## 2014-08-10 HISTORY — DX: Type 2 diabetes mellitus without complications: E11.9

## 2014-08-10 SURGERY — EGD (ESOPHAGOGASTRODUODENOSCOPY)
Anesthesia: Moderate Sedation

## 2014-08-10 MED ORDER — LIDOCAINE VISCOUS 2 % MT SOLN
OROMUCOSAL | Status: AC
  Filled 2014-08-10: qty 15

## 2014-08-10 MED ORDER — ONDANSETRON HCL 4 MG/2ML IJ SOLN
INTRAMUSCULAR | Status: DC | PRN
  Administered 2014-08-10: 4 mg via INTRAVENOUS

## 2014-08-10 MED ORDER — LIDOCAINE VISCOUS 2 % MT SOLN
OROMUCOSAL | Status: DC | PRN
  Administered 2014-08-10: 3 mL via OROMUCOSAL

## 2014-08-10 MED ORDER — MIDAZOLAM HCL 5 MG/5ML IJ SOLN
INTRAMUSCULAR | Status: AC
  Filled 2014-08-10: qty 10

## 2014-08-10 MED ORDER — SODIUM CHLORIDE 0.9 % IV SOLN
INTRAVENOUS | Status: DC
  Administered 2014-08-10: 13:00:00 via INTRAVENOUS

## 2014-08-10 MED ORDER — STERILE WATER FOR IRRIGATION IR SOLN
Status: DC | PRN
  Administered 2014-08-10: 14:00:00

## 2014-08-10 MED ORDER — MIDAZOLAM HCL 5 MG/5ML IJ SOLN
INTRAMUSCULAR | Status: DC | PRN
  Administered 2014-08-10 (×2): 2 mg via INTRAVENOUS

## 2014-08-10 MED ORDER — ONDANSETRON HCL 4 MG/2ML IJ SOLN
INTRAMUSCULAR | Status: AC
  Filled 2014-08-10: qty 2

## 2014-08-10 MED ORDER — MEPERIDINE HCL 100 MG/ML IJ SOLN
INTRAMUSCULAR | Status: DC | PRN
  Administered 2014-08-10: 25 mg via INTRAVENOUS
  Administered 2014-08-10: 50 mg via INTRAVENOUS

## 2014-08-10 MED ORDER — MEPERIDINE HCL 100 MG/ML IJ SOLN
INTRAMUSCULAR | Status: AC
  Filled 2014-08-10: qty 2

## 2014-08-10 NOTE — Discharge Instructions (Signed)
EGD Discharge instructions Please read the instructions outlined below and refer to this sheet in the next few weeks. These discharge instructions provide you with general information on caring for yourself after you leave the hospital. Your doctor may also give you specific instructions. While your treatment has been planned according to the most current medical practices available, unavoidable complications occasionally occur. If you have any problems or questions after discharge, please call your doctor. ACTIVITY  You may resume your regular activity but move at a slower pace for the next 24 hours.   Take frequent rest periods for the next 24 hours.   Walking will help expel (get rid of) the air and reduce the bloated feeling in your abdomen.   No driving for 24 hours (because of the anesthesia (medicine) used during the test).   You may shower.   Do not sign any important legal documents or operate any machinery for 24 hours (because of the anesthesia used during the test).  NUTRITION  Drink plenty of fluids.   You may resume your normal diet.   Begin with a light meal and progress to your normal diet.   Avoid alcoholic beverages for 24 hours or as instructed by your caregiver.  MEDICATIONS  You may resume your normal medications unless your caregiver tells you otherwise.  WHAT YOU CAN EXPECT TODAY  You may experience abdominal discomfort such as a feeling of fullness or gas pains.  FOLLOW-UP  Your doctor will discuss the results of your test with you.  SEEK IMMEDIATE MEDICAL ATTENTION IF ANY OF THE FOLLOWING OCCUR:  Excessive nausea (feeling sick to your stomach) and/or vomiting.   Severe abdominal pain and distention (swelling).   Trouble swallowing.   Temperature over 101 F (37.8 C).   Rectal bleeding or vomiting of blood.    Continue Nexium 40 mg daily  Swallowing precautions reviewed  Office visit with [place] in one year

## 2014-08-10 NOTE — Interval H&P Note (Signed)
History and Physical Interval Note:  08/10/2014 2:11 PM  Darriel L Wickens  has presented today for surgery, with the diagnosis of dysphagia  The various methods of treatment have been discussed with the patient and family. After consideration of risks, benefits and other options for treatment, the patient has consented to  Procedure(s) with comments: ESOPHAGOGASTRODUODENOSCOPY (EGD) (N/A) - 130  SAVORY DILATION (N/A) MALONEY DILATION (N/A) as a surgical intervention .  The patient's history has been reviewed, patient examined, no change in status, stable for surgery.  I have reviewed the patient's chart and labs.  Questions were answered to the patient's satisfaction.     Talitha Dicarlo  No change. EGD with soccer dilation as appropriate per plan.The risks, benefits, limitations, alternatives and imponderables have been reviewed with the patient. Potential for esophageal dilation, biopsy, etc. have also been reviewed.  Questions have been answered. All parties agreeable.

## 2014-08-10 NOTE — H&P (View-Only) (Signed)
Referring Provider: Caryl Bis, MD Primary Care Physician:  Gar Ponto, MD  Primary GI: Dr. Gala Romney   Chief Complaint  Patient presents with  . Dysphagia    HPI:   [name] presents today secondary to recurrent dysphagia. History of UC, with last colonoscopy in Jan 2015. Due for surveillance in 2020.  Multiple dilations in the past secondary to Schatzki's ring, most recently 2013. Symptom onset about a month ago. Mild at first, now worsening. Soft foods ok. Difficulty with liquids if some other food item already lodged. Intermittent odynophagia. Feels like reflux is acting up. When food gets stuck, has to regurgitate up, sometimes has to cough it up. Pill dysphagia. Trazodone, Vit D gets stuck. Pepcid as needed. No weight loss or lack of appetite.   No lower GI symptoms of concern.    Past Medical History  Diagnosis Date  . Depression   . Ulcerative colitis 2007  . GERD (gastroesophageal reflux disease)   . Pancreatitis   . COPD (chronic obstructive pulmonary disease)   . Diverticulitis   . Sleep apnea   . Schatzki's ring   . DDD (degenerative disc disease)   . Chronic back pain   . Arthritis   . PVC's (premature ventricular contractions)   . Hiatal hernia   . Wears dentures     full top-partial bottom  . HOH (hard of hearing)     right  . Degenerative tear of posterior horn of lateral meniscus of right knee 01/27/2014    Past Surgical History  Procedure Laterality Date  . Tonsillectomy    . Dilation and curettage of uterus    . Hernia repair    . Cholecystectomy    . Cervical cone biopsy    . Arch and foot    . Esophagogastroduodenoscopy  01/28/2011    Rourk- Schatzki ring, s/p 41F, otherwise normal/Hiatal hernia otherwise normal stomach D1 and D2  . Colonoscopy  12/03/05    Rourk-marked inflammatory changes of the rectum, left colon, pan colitis, internal hemorrhoids, sigmoid diverticula  . Esophagogastroduodenoscopy  11/30/08   Rourk-Schatzki's ring status post dilation, hiatal hernia  . Esophagogastroduodenoscopy  07/12/2007    Dilation with 56 French, Schatzki's ring  . Esophagogastroduodenoscopy (egd) with esophageal dilation  09/01/2012    Dr. Gala Romney- schatzki's ring, hiatal hernia  . Colonoscopy N/A 10/20/2013    Dr. Rourk:Colonic diverticulosis. colonic mucosal ulcerations involving segment of sigmoid colon, atypical for UC. sigmoid colon path with chronic mildly active colitis consistent with IBD, other biopsies including ascending, transverse, descending colon and rectum unremarkable.   . Foot arthrodesis  [id number]    both feet  . Knee arthroscopy with lateral menisectomy Right 01/27/2014    Procedure: RIGHT KNEE ARTHROSCOPY WITH PARTIAL LATERAL MENISECTOMY, ;  Surgeon: Johnny Bridge, MD;  Location: [facility];  Service: Orthopedics;  Laterality: Right;    Current Outpatient Prescriptions  Medication Sig Dispense Refill  . albuterol (PROVENTIL) (2.5 MG/3ML) 0.083% nebulizer solution Take 2.5 mg by nebulization every 6 (six) hours as needed for wheezing or shortness of breath.    Marland Kitchen albuterol (PROVENTIL,VENTOLIN) 90 MCG/ACT inhaler Inhale 2 puffs into the lungs every 4 (four) hours as needed.      Marland Kitchen aspirin 81 MG tablet Take 81 mg by mouth daily.      Marland Kitchen azelastine (ASTELIN) 137 MCG/SPRAY nasal spray 2 sprays by Nasal route 2 (two) times daily. Use in each nostril as directed     .  balsalazide (COLAZAL) 750 MG capsule Take 3 capsules (2,250 mg total) by mouth 2 (two) times daily. 540 capsule 3  . buPROPion (WELLBUTRIN SR) 150 MG 12 hr tablet Take 150 mg by mouth daily.    . busPIRone (BUSPAR) 15 MG tablet Take 7.5 mg by mouth 2 (two) times daily. 1/2 tablet bid    . Calcium Carb-Cholecalciferol (CALCIUM-VITAMIN D) 600-400 MG-UNIT TABS Take 1 tablet by mouth 2 (two) times daily.    . cetirizine (ZYRTEC) 10 MG chewable tablet Chew 10 mg by mouth daily.      . DULoxetine (CYMBALTA) 60 MG capsule Take  60 mg by mouth daily.      Marland Kitchen esomeprazole (NEXIUM) 40 MG capsule Take 1 capsule (40 mg total) by mouth daily before breakfast. 30 capsule 5  . famotidine-calcium carbonate-magnesium hydroxide (PEPCID COMPLETE) 10-800-165 MG CHEW chewable tablet Chew 1 tablet by mouth daily as needed.    . ferrous gluconate (FERGON) 225 (27 FE) MG tablet Take 240 mg by mouth daily.     . Fluticasone-Salmeterol (ADVAIR DISKUS) 100-50 MCG/DOSE AEPB Inhale 1 puff into the lungs every 12 (twelve) hours.      Marland Kitchen ketotifen (ALAWAY) 0.025 % ophthalmic solution Place 2 drops into both eyes 2 (two) times daily.    . metFORMIN (GLUCOPHAGE-XR) 500 MG 24 hr tablet Take 1,000 mg by mouth every evening.     . methylcellulose (CITRUCEL) oral powder Take 1 packet by mouth 2 (two) times daily.    . pregabalin (LYRICA) 75 MG capsule Take 75 mg by mouth 2 (two) times daily.    . traMADol (ULTRAM-ER) 100 MG 24 hr tablet Take 100 mg by mouth at bedtime.    . traZODone (DESYREL) 100 MG tablet Take 100 mg by mouth at bedtime.       No current facility-administered medications for this visit.    Allergies as of 08/03/2014 - Review Complete 08/03/2014  Allergen Reaction Noted  . Aspirin Other (See Comments)   . Cefprozil Other (See Comments)   . Cefuroxime axetil Other (See Comments) 08/08/2008  . Clarithromycin Nausea And Vomiting 08/08/2008  . Codeine Other (See Comments)   . Entex Other (See Comments) 08/08/2008  . Mesalamine Other (See Comments)   . Morphine and related Hives 08/23/2012  . Penicillins Hives 08/08/2008    Family History  Problem Relation Age of Onset  . Colon cancer Neg Hx     History   Social History  . Marital Status: Married    Spouse Name: N/A    Number of Children: 3  . Years of Education: N/A   Occupational History  . 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 Smoking status: Former Smoker -- 1.00 packs/day for 30 years    Types: Cigarettes    Quit date: 08/29/2013  .  Smokeless tobacco: Never Used  . Alcohol Use: No  . Drug Use: No  . Sexual Activity:    Partners: Male    Birth Control/ Protection: None     Comment: spouse   Other Topics Concern  . None   Social History Narrative    Review of Systems: As mentioned in HPI  Physical Exam: BP 124/79 mmHg  Pulse 85  Temp(Src) 98 F (36.7 C) (Oral)  Ht 5' 4"  (1.626 m)  Wt 189 lb (85.73 kg)  BMI 32.43 kg/m2 General:   Alert and oriented. No distress noted. Pleasant and cooperative.  Head:  Normocephalic and atraumatic. Eyes:  Conjuctiva  clear without scleral icterus. Mouth:  Oral mucosa pink and moist. Good dentition. No lesions. Neck:  Supple, without mass or thyromegaly. Heart:  S1, S2 present without murmurs, rubs, or gallops. Regular rate and rhythm. Abdomen:  +BS, soft, non-tender and non-distended. No rebound or guarding. No HSM or masses noted. Msk:  Symmetrical without gross deformities. Normal posture. Extremities:  Without edema. Neurologic:  Alert and  oriented x4;  grossly normal neurologically. Skin:  Intact without significant lesions or rashes. Psych:  Alert and cooperative. Normal mood and affect.

## 2014-08-10 NOTE — Op Note (Signed)
[facility] [address] [place], [address]   ENDOSCOPY PROCEDURE REPORT  PATIENT: [name], [name]  MR#: [id number] BIRTHDATE: [birth year] , 34  yrs. old GENDER: female ENDOSCOPIST: R.  Garfield Cornea, MD FACP [facility] REFERRED BY: PROCEDURE DATE:  2014-08-19 PROCEDURE:  EGD with [name] dilation of esophagus INDICATIONS:  esophageal dysphagia; history of Schatzki's ring  MEDICATIONS: Versed 4 mg IV and 75 mg IV in divided doses.  Zofran 4 mg IV.  Xylocaine gel orally. ASA CLASS:      Class III  CONSENT: The risks, benefits, limitations, alternatives and imponderables have been discussed.  The potential for biopsy, esophogeal dilation, etc. have also been reviewed.  Questions have been answered.  All parties agreeable.  Please see the history and physical in the medical record for more information.  DESCRIPTION OF PROCEDURE: After the risks benefits and alternatives of the procedure were thoroughly explained, informed consent was obtained.  The [id number] ([id number]) endoscope was introduced through the mouth and advanced to the second portion of the duodenum , limited by Without limitations. The instrument was slowly withdrawn as the mucosa was fully examined.    Prominent Schatzki's ring.  No tumor.  No esophagitis.  No Barrett's esophagus.   Stomach empty.  Moderate size hiatal hernia.  Normal gastric mucosa patent pylorus.  Normal first and second portion of the duodenum scope was withdrawn a 56 [place] Maloney dilator was passed to three quarters insertion.  A look back revealed the ring remain intact.  Subsequently, a 58 French Maloney dilator was passed three quarters insertion with moderate resistance.  A look back revealed the ring and did locally disrupted but it remained intact otherwise.  Finally, biopsy disruption was performed with 3 quadrant "bites" of the ring being taken.  This was done effectively without apparent complication.  Retroflexed  views revealed as previously described.     The scope was then withdrawn from the patient and the procedure completed.  COMPLICATIONS: There were no immediate complications.  ENDOSCOPIC IMPRESSION: Prominent Schatzki's ring?"status post Maloney dilation and disruption as described above. Moderate-sized hiatal hernia.   RECOMMENDATIONS: Continue Nexium 40 mg daily. Office visit in one year. Swallowing precautions reviewed.  REPEAT EXAM:  eSigned:  R. Garfield Cornea, MD Rosalita Chessman [facility] August 19, 2014 3:07 PM    CC:  CPT CODES: ICD CODES:  The ICD and CPT codes recommended by this software are interpretations from the data that the clinical staff has captured with the software.  The verification of the translation of this report to the ICD and CPT codes and modifiers is the sole responsibility of the health care institution and practicing physician where this report was generated.  [employer] will not be held responsible for the validity of the ICD and CPT codes included on this report.  AMA assumes no liability for data contained or not contained herein. CPT is a Designer, television/film set of the [employer].  PATIENT NAME:  [name], [name] MR#: [id number]

## 2014-08-11 LAB — GLUCOSE, CAPILLARY: Glucose-Capillary: 96 mg/dL (ref 70–99)

## 2014-08-14 ENCOUNTER — Encounter (HOSPITAL_COMMUNITY): Payer: Self-pay | Admitting: Internal Medicine

## 2014-08-15 ENCOUNTER — Ambulatory Visit (INDEPENDENT_AMBULATORY_CARE_PROVIDER_SITE_OTHER): Payer: Medicare Other | Admitting: Cardiology

## 2014-08-15 ENCOUNTER — Encounter: Payer: Self-pay | Admitting: Cardiology

## 2014-08-15 VITALS — BP 135/80 | HR 73 | Ht 64.0 in | Wt 190.0 lb

## 2014-08-15 DIAGNOSIS — R011 Cardiac murmur, unspecified: Secondary | ICD-10-CM

## 2014-08-15 DIAGNOSIS — R079 Chest pain, unspecified: Secondary | ICD-10-CM

## 2014-08-15 MED ORDER — PRAVASTATIN SODIUM 20 MG PO TABS: 20.0000 mg | ORAL_TABLET | Freq: Every evening | ORAL | Status: DC

## 2014-08-15 NOTE — Progress Notes (Signed)
Clinical Summary [name] is a 63 y.o.female seen today for follow up of the following medical problems. This is a focused visit on her history of chest pain.  1. Chest pain - seen last visit for chest pain, she has multiple CAD risk factors including DM and was referred for stress testing. - 06/2014 Lexiscan MPI without ischemia - recent EGD with dilatation, since then chest pain somewhat improved. Continues to have DOE.        Past Medical History  Diagnosis Date  . Depression   . Ulcerative colitis 2007  . GERD (gastroesophageal reflux disease)   . Pancreatitis   . COPD (chronic obstructive pulmonary disease)   . Diverticulitis   . Schatzki's ring   . DDD (degenerative disc disease)   . Chronic back pain   . Arthritis   . PVC's (premature ventricular contractions)   . Hiatal hernia   . Wears dentures     full top-partial bottom  . HOH (hard of hearing)     right  . Degenerative tear of posterior horn of lateral meniscus of right knee 01/27/2014  . Sleep apnea     CPAP at night  . Diabetes mellitus without complication   . PONV (postoperative nausea and vomiting)     only happened after knee arthroscopy     Allergies  Allergen Reactions  . Aspirin Other (See Comments)    Affects the central nervous system. "shakey"  . Cefprozil Other (See Comments)    Messes with Ulcerative colitis   . Cefuroxime Axetil Other (See Comments)    Messes with Ulcerative colitis   . Clarithromycin Nausea And Vomiting  . Codeine Other (See Comments)    Hallucinations/bad dreams.  . Entex Other (See Comments)    insomnia  . Mesalamine Other (See Comments)    Pancreatitis   . Morphine And Related Hives  . Penicillins Hives     Current Outpatient Prescriptions  Medication Sig Dispense Refill  . albuterol (PROVENTIL) (2.5 MG/3ML) 0.083% nebulizer solution Take 2.5 mg by nebulization every 6 (six) hours as needed for wheezing or shortness of breath.    Marland Kitchen albuterol  (PROVENTIL,VENTOLIN) 90 MCG/ACT inhaler Inhale 2 puffs into the lungs every 4 (four) hours as needed.      Marland Kitchen aspirin 81 MG tablet Take 81 mg by mouth daily.      Marland Kitchen azelastine (ASTELIN) 137 MCG/SPRAY nasal spray 2 sprays by Nasal route 2 (two) times daily. Use in each nostril as directed     . balsalazide (COLAZAL) 750 MG capsule Take 3 capsules (2,250 mg total) by mouth 2 (two) times daily. 540 capsule 3  . buPROPion (WELLBUTRIN SR) 150 MG 12 hr tablet Take 150 mg by mouth daily.    . busPIRone (BUSPAR) 15 MG tablet Take 7.5 mg by mouth 2 (two) times daily. 1/2 tablet bid    . Calcium Carb-Cholecalciferol (CALCIUM-VITAMIN D) 600-400 MG-UNIT TABS Take 1 tablet by mouth 2 (two) times daily.    . cetirizine (ZYRTEC) 10 MG chewable tablet Chew 10 mg by mouth daily.      . DULoxetine (CYMBALTA) 60 MG capsule Take 60 mg by mouth daily.      Marland Kitchen esomeprazole (NEXIUM) 40 MG capsule Take 1 capsule (40 mg total) by mouth daily before breakfast. 30 capsule 5  . famotidine-calcium carbonate-magnesium hydroxide (PEPCID COMPLETE) 10-800-165 MG CHEW chewable tablet Chew 1 tablet by mouth daily as needed.    . ferrous gluconate (FERGON) 225 (27 FE)  MG tablet Take 240 mg by mouth daily.     . Fluticasone-Salmeterol (ADVAIR DISKUS) 100-50 MCG/DOSE AEPB Inhale 1 puff into the lungs every 12 (twelve) hours.      Marland Kitchen ketotifen (ALAWAY) 0.025 % ophthalmic solution Place 2 drops into both eyes 2 (two) times daily.    . metFORMIN (GLUCOPHAGE-XR) 500 MG 24 hr tablet Take 1,000 mg by mouth every evening.     . methylcellulose (CITRUCEL) oral powder Take 1 packet by mouth 2 (two) times daily.    . pregabalin (LYRICA) 75 MG capsule Take 75 mg by mouth 2 (two) times daily.    . traMADol (ULTRAM-ER) 100 MG 24 hr tablet Take 100 mg by mouth at bedtime.    . traZODone (DESYREL) 100 MG tablet Take 100 mg by mouth at bedtime.       No current facility-administered medications for this visit.     Past Surgical History  Procedure  Laterality Date  . Tonsillectomy    . Dilation and curettage of uterus    . Hernia repair    . Cholecystectomy    . Cervical cone biopsy    . Arch and foot    . Esophagogastroduodenoscopy  01/28/2011    Rourk- Schatzki ring, s/p 30F, otherwise normal/Hiatal hernia otherwise normal stomach D1 and D2  . Colonoscopy  12/03/05    Rourk-marked inflammatory changes of the rectum, left colon, pan colitis, internal hemorrhoids, sigmoid diverticula  . Esophagogastroduodenoscopy  11/30/08    Rourk-Schatzki's ring status post dilation, hiatal hernia  . Esophagogastroduodenoscopy  07/12/2007    Dilation with 56 French, Schatzki's ring  . Esophagogastroduodenoscopy (egd) with esophageal dilation  09/01/2012    Dr. Gala Romney- schatzki's ring, hiatal hernia  . Colonoscopy N/A 10/20/2013    Dr. Rourk:Colonic diverticulosis. colonic mucosal ulcerations involving segment of sigmoid colon, atypical for UC. sigmoid colon path with chronic mildly active colitis consistent with IBD, other biopsies including ascending, transverse, descending colon and rectum unremarkable.   . Foot arthrodesis  [id number]    both feet  . Knee arthroscopy with lateral menisectomy Right 01/27/2014    Procedure: RIGHT KNEE ARTHROSCOPY WITH PARTIAL LATERAL MENISECTOMY, ;  Surgeon: Johnny Bridge, MD;  Location: [place];  Service: Orthopedics;  Laterality: Right;  . Esophagogastroduodenoscopy N/A 08/10/2014    Procedure: ESOPHAGOGASTRODUODENOSCOPY (EGD);  Surgeon: Daneil Dolin, MD;  [facility];  Service: Endoscopy;  Laterality: N/A;  130   . Savory dilation N/A 08/10/2014    Procedure: SAVORY DILATION;  Surgeon: Daneil Dolin, MD;  [facility];  Service: Endoscopy;  Laterality: N/A;  [name] dilation N/A 08/10/2014    Procedure: [name] DILATION;  Surgeon: Daneil Dolin, MD;  [facility]: [facility];  Service: Endoscopy;  Laterality: N/A;     Allergies  Allergen Reactions  . Aspirin Other  (See Comments)    Affects the central nervous system. "shakey"  . Cefprozil Other (See Comments)    Messes with Ulcerative colitis   . Cefuroxime Axetil Other (See Comments)    Messes with Ulcerative colitis   . Clarithromycin Nausea And Vomiting  . Codeine Other (See Comments)    Hallucinations/bad dreams.  . Entex Other (See Comments)    insomnia  . Mesalamine Other (See Comments)    Pancreatitis   . Morphine And Related Hives  . Penicillins Hives      Family History  Problem Relation Age of Onset  . Colon cancer Neg Hx  Social History Ms. Lehrman reports that she quit smoking about a year ago. Her smoking use included Cigarettes. She has a 30 pack-year smoking history. She has never used smokeless tobacco. Ms. Ross reports that she does not drink alcohol.   Review of Systems CONSTITUTIONAL: No weight loss, fever, chills, weakness or fatigue.  HEENT: Eyes: No visual loss, blurred vision, double vision or yellow sclerae.No hearing loss, sneezing, congestion, runny nose or sore throat.  SKIN: No rash or itching.  CARDIOVASCULAR: per HPI RESPIRATORY: No shortness of breath, cough or sputum.  GASTROINTESTINAL: No anorexia, nausea, vomiting or diarrhea. No abdominal pain or blood.  GENITOURINARY: No burning on urination, no polyuria NEUROLOGICAL: No headache, dizziness, syncope, paralysis, ataxia, numbness or tingling in the extremities. No change in bowel or bladder control.  MUSCULOSKELETAL: No muscle, back pain, joint pain or stiffness.  LYMPHATICS: No enlarged nodes. No history of splenectomy.  PSYCHIATRIC: No history of depression or anxiety.  ENDOCRINOLOGIC: No reports of sweating, cold or heat intolerance. No polyuria or polydipsia.  Marland Kitchen   Physical Examination p 73 bp 135/80 Wt 190 lbs BMI 33 Gen: resting comfortably, no acute distress HEENT: no scleral icterus, pupils equal round and reactive, no palptable cervical adenopathy,  CV: RRR, 2/6 systolic murmur  LLSB, no JVD Resp: Clear to auscultation bilaterally GI: abdomen is soft, non-tender, non-distended, normal bowel sounds, no hepatosplenomegaly MSK: extremities are warm, no edema.  Skin: warm, no rash Neuro:  no focal deficits Psych: appropriate affect   Diagnostic Studies  06/2014 Lexiscan MPI IMPRESSION: 1. No reversible ischemia or infarction.  2. Normal left ventricular wall motion.  3. Left ventricular ejection fraction 60%  4. Low-risk stress test findings*.   Assessment and Plan   1. Chest pain - negative stress test, symptoms some what improved after recent GI procedure. - continue CAD risk factor modifcation - previous muscle aches on lipitor, given her DM she should be on a statin. Will start pravastatin 43m daily.   2. Heart murmur - noted murmur on exam, reports ongoing DOE. Will check echo.    F/u 6 months  JArnoldo Lenis M.D.

## 2014-08-15 NOTE — Patient Instructions (Signed)
Your physician has requested that you have an echocardiogram. Echocardiography is a painless test that uses sound waves to create images of your heart. It provides your doctor with information about the size and shape of your heart and how well your heart's chambers and valves are working. This procedure takes approximately one hour. There are no restrictions for this procedure. Office will contact with results via phone or letter.   Begin Pravastatin 35m daily - new sent to local pharm Continue all other medications.   Your physician wants you to follow up in: 6 months.  You will receive a reminder letter in the mail one-two months in advance.  If you don't receive a letter, please call our office to schedule the follow up appointment

## 2014-08-16 ENCOUNTER — Other Ambulatory Visit: Payer: Self-pay

## 2014-08-16 ENCOUNTER — Other Ambulatory Visit (INDEPENDENT_AMBULATORY_CARE_PROVIDER_SITE_OTHER): Payer: Medicare Other

## 2014-08-16 DIAGNOSIS — R011 Cardiac murmur, unspecified: Secondary | ICD-10-CM

## 2014-08-16 DIAGNOSIS — R079 Chest pain, unspecified: Secondary | ICD-10-CM

## 2014-08-18 ENCOUNTER — Telehealth: Payer: Self-pay | Admitting: *Deleted

## 2014-08-18 NOTE — Telephone Encounter (Signed)
Notes Recorded by Laurine Blazer, LPN on 78/71/8367 at 9:56 AM Left message to return call.

## 2014-08-18 NOTE — Telephone Encounter (Signed)
-----   Message from Arnoldo Lenis, MD sent at 08/17/2014  5:00 PM EST ----- Echo looks good, her heart function is normal. She had a mild heart murmur on exam, but her heart valves look good, there is no significant abnormality of her heart valves.   Zandra Abts MD

## 2014-08-21 DIAGNOSIS — M47816 Spondylosis without myelopathy or radiculopathy, lumbar region: Secondary | ICD-10-CM | POA: Diagnosis not present

## 2014-08-21 DIAGNOSIS — M62838 Other muscle spasm: Secondary | ICD-10-CM | POA: Diagnosis not present

## 2014-08-22 NOTE — Telephone Encounter (Signed)
Notes Recorded by Laurine Blazer, LPN on 36/43/8377 at 12:21 PM Left message to return call.

## 2014-08-28 NOTE — Telephone Encounter (Signed)
Notes Recorded by Laurine Blazer, LPN on 56/15/3794 at 9:44 AM Patient notified.

## 2014-09-04 DIAGNOSIS — G4733 Obstructive sleep apnea (adult) (pediatric): Secondary | ICD-10-CM | POA: Diagnosis present

## 2014-09-04 DIAGNOSIS — J189 Pneumonia, unspecified organism: Secondary | ICD-10-CM | POA: Diagnosis not present

## 2014-09-04 DIAGNOSIS — E119 Type 2 diabetes mellitus without complications: Secondary | ICD-10-CM | POA: Diagnosis not present

## 2014-09-04 DIAGNOSIS — K5190 Ulcerative colitis, unspecified, without complications: Secondary | ICD-10-CM | POA: Diagnosis present

## 2014-09-04 DIAGNOSIS — Z79899 Other long term (current) drug therapy: Secondary | ICD-10-CM | POA: Diagnosis not present

## 2014-09-04 DIAGNOSIS — Z7951 Long term (current) use of inhaled steroids: Secondary | ICD-10-CM | POA: Diagnosis not present

## 2014-09-04 DIAGNOSIS — Z880 Allergy status to penicillin: Secondary | ICD-10-CM | POA: Diagnosis not present

## 2014-09-04 DIAGNOSIS — K589 Irritable bowel syndrome without diarrhea: Secondary | ICD-10-CM | POA: Diagnosis present

## 2014-09-04 DIAGNOSIS — J181 Lobar pneumonia, unspecified organism: Secondary | ICD-10-CM | POA: Diagnosis not present

## 2014-09-04 DIAGNOSIS — M1990 Unspecified osteoarthritis, unspecified site: Secondary | ICD-10-CM | POA: Diagnosis present

## 2014-09-04 DIAGNOSIS — F329 Major depressive disorder, single episode, unspecified: Secondary | ICD-10-CM | POA: Diagnosis present

## 2014-09-04 DIAGNOSIS — J90 Pleural effusion, not elsewhere classified: Secondary | ICD-10-CM | POA: Diagnosis not present

## 2014-09-04 DIAGNOSIS — Z885 Allergy status to narcotic agent status: Secondary | ICD-10-CM | POA: Diagnosis not present

## 2014-09-04 DIAGNOSIS — J159 Unspecified bacterial pneumonia: Secondary | ICD-10-CM | POA: Diagnosis not present

## 2014-09-04 DIAGNOSIS — Z888 Allergy status to other drugs, medicaments and biological substances status: Secondary | ICD-10-CM | POA: Diagnosis not present

## 2014-09-04 DIAGNOSIS — Z883 Allergy status to other anti-infective agents status: Secondary | ICD-10-CM | POA: Diagnosis not present

## 2014-09-04 DIAGNOSIS — Z87891 Personal history of nicotine dependence: Secondary | ICD-10-CM | POA: Diagnosis not present

## 2014-09-04 DIAGNOSIS — H6591 Unspecified nonsuppurative otitis media, right ear: Secondary | ICD-10-CM | POA: Diagnosis present

## 2014-09-04 DIAGNOSIS — E871 Hypo-osmolality and hyponatremia: Secondary | ICD-10-CM | POA: Diagnosis not present

## 2014-09-04 DIAGNOSIS — Z7982 Long term (current) use of aspirin: Secondary | ICD-10-CM | POA: Diagnosis not present

## 2014-09-04 DIAGNOSIS — F419 Anxiety disorder, unspecified: Secondary | ICD-10-CM | POA: Diagnosis present

## 2014-09-04 DIAGNOSIS — J449 Chronic obstructive pulmonary disease, unspecified: Secondary | ICD-10-CM | POA: Diagnosis present

## 2014-09-04 DIAGNOSIS — J439 Emphysema, unspecified: Secondary | ICD-10-CM | POA: Diagnosis not present

## 2014-09-04 DIAGNOSIS — Z886 Allergy status to analgesic agent status: Secondary | ICD-10-CM | POA: Diagnosis not present

## 2014-09-04 DIAGNOSIS — E222 Syndrome of inappropriate secretion of antidiuretic hormone: Secondary | ICD-10-CM | POA: Diagnosis present

## 2014-09-06 ENCOUNTER — Ambulatory Visit: Payer: Medicare Other | Admitting: Gastroenterology

## 2014-09-19 DIAGNOSIS — J189 Pneumonia, unspecified organism: Secondary | ICD-10-CM | POA: Diagnosis not present

## 2014-09-19 DIAGNOSIS — Z1389 Encounter for screening for other disorder: Secondary | ICD-10-CM | POA: Diagnosis not present

## 2014-10-02 DIAGNOSIS — F321 Major depressive disorder, single episode, moderate: Secondary | ICD-10-CM | POA: Diagnosis not present

## 2014-10-02 DIAGNOSIS — G4733 Obstructive sleep apnea (adult) (pediatric): Secondary | ICD-10-CM | POA: Diagnosis not present

## 2014-10-02 DIAGNOSIS — E780 Pure hypercholesterolemia: Secondary | ICD-10-CM | POA: Diagnosis not present

## 2014-10-02 DIAGNOSIS — E1165 Type 2 diabetes mellitus with hyperglycemia: Secondary | ICD-10-CM | POA: Diagnosis not present

## 2014-10-02 DIAGNOSIS — K219 Gastro-esophageal reflux disease without esophagitis: Secondary | ICD-10-CM | POA: Diagnosis not present

## 2014-10-04 DIAGNOSIS — E782 Mixed hyperlipidemia: Secondary | ICD-10-CM | POA: Diagnosis not present

## 2014-10-04 DIAGNOSIS — J4520 Mild intermittent asthma, uncomplicated: Secondary | ICD-10-CM | POA: Diagnosis not present

## 2014-10-04 DIAGNOSIS — K5190 Ulcerative colitis, unspecified, without complications: Secondary | ICD-10-CM | POA: Diagnosis not present

## 2014-10-04 DIAGNOSIS — G4733 Obstructive sleep apnea (adult) (pediatric): Secondary | ICD-10-CM | POA: Diagnosis not present

## 2014-10-04 DIAGNOSIS — F329 Major depressive disorder, single episode, unspecified: Secondary | ICD-10-CM | POA: Diagnosis not present

## 2014-10-04 DIAGNOSIS — J309 Allergic rhinitis, unspecified: Secondary | ICD-10-CM | POA: Diagnosis not present

## 2014-10-04 DIAGNOSIS — G4700 Insomnia, unspecified: Secondary | ICD-10-CM | POA: Diagnosis not present

## 2014-10-04 DIAGNOSIS — E119 Type 2 diabetes mellitus without complications: Secondary | ICD-10-CM | POA: Diagnosis not present

## 2014-10-04 DIAGNOSIS — K219 Gastro-esophageal reflux disease without esophagitis: Secondary | ICD-10-CM | POA: Diagnosis not present

## 2014-10-04 DIAGNOSIS — M1990 Unspecified osteoarthritis, unspecified site: Secondary | ICD-10-CM | POA: Diagnosis not present

## 2014-10-04 DIAGNOSIS — J189 Pneumonia, unspecified organism: Secondary | ICD-10-CM | POA: Diagnosis not present

## 2014-10-04 DIAGNOSIS — N189 Chronic kidney disease, unspecified: Secondary | ICD-10-CM | POA: Diagnosis not present

## 2014-10-10 DIAGNOSIS — J189 Pneumonia, unspecified organism: Secondary | ICD-10-CM | POA: Diagnosis not present

## 2014-10-17 DIAGNOSIS — M25512 Pain in left shoulder: Secondary | ICD-10-CM | POA: Diagnosis not present

## 2014-10-19 DIAGNOSIS — M7582 Other shoulder lesions, left shoulder: Secondary | ICD-10-CM | POA: Diagnosis not present

## 2014-10-19 DIAGNOSIS — S46812A Strain of other muscles, fascia and tendons at shoulder and upper arm level, left arm, initial encounter: Secondary | ICD-10-CM | POA: Diagnosis not present

## 2014-10-19 DIAGNOSIS — S4382XA Sprain of other specified parts of left shoulder girdle, initial encounter: Secondary | ICD-10-CM | POA: Diagnosis not present

## 2014-10-19 DIAGNOSIS — M25412 Effusion, left shoulder: Secondary | ICD-10-CM | POA: Diagnosis not present

## 2014-10-19 DIAGNOSIS — S43432A Superior glenoid labrum lesion of left shoulder, initial encounter: Secondary | ICD-10-CM | POA: Diagnosis not present

## 2014-10-19 DIAGNOSIS — M19012 Primary osteoarthritis, left shoulder: Secondary | ICD-10-CM | POA: Diagnosis not present

## 2014-10-23 ENCOUNTER — Other Ambulatory Visit: Payer: Self-pay | Admitting: Cardiology

## 2014-10-23 MED ORDER — PRAVASTATIN SODIUM 20 MG PO TABS: 20.0000 mg | ORAL_TABLET | Freq: Every evening | ORAL | Status: DC

## 2014-10-23 NOTE — Telephone Encounter (Signed)
Patient is requesting to have pravastatin (PRAVACHOL) 20 MG tablet sent to [facility]. Please do not send to [facility].

## 2014-10-27 DIAGNOSIS — M25512 Pain in left shoulder: Secondary | ICD-10-CM | POA: Diagnosis not present

## 2014-11-01 DIAGNOSIS — J189 Pneumonia, unspecified organism: Secondary | ICD-10-CM | POA: Diagnosis not present

## 2014-11-08 DIAGNOSIS — M75122 Complete rotator cuff tear or rupture of left shoulder, not specified as traumatic: Secondary | ICD-10-CM | POA: Diagnosis not present

## 2014-11-08 DIAGNOSIS — M19012 Primary osteoarthritis, left shoulder: Secondary | ICD-10-CM | POA: Diagnosis not present

## 2014-11-08 DIAGNOSIS — M7542 Impingement syndrome of left shoulder: Secondary | ICD-10-CM | POA: Diagnosis not present

## 2014-11-08 DIAGNOSIS — M1990 Unspecified osteoarthritis, unspecified site: Secondary | ICD-10-CM | POA: Diagnosis not present

## 2014-11-08 DIAGNOSIS — G8918 Other acute postprocedural pain: Secondary | ICD-10-CM | POA: Diagnosis not present

## 2014-11-08 DIAGNOSIS — M24112 Other articular cartilage disorders, left shoulder: Secondary | ICD-10-CM | POA: Diagnosis not present

## 2014-11-15 DIAGNOSIS — M6281 Muscle weakness (generalized): Secondary | ICD-10-CM | POA: Diagnosis not present

## 2014-11-15 DIAGNOSIS — M25512 Pain in left shoulder: Secondary | ICD-10-CM | POA: Diagnosis not present

## 2014-11-15 DIAGNOSIS — M75112 Incomplete rotator cuff tear or rupture of left shoulder, not specified as traumatic: Secondary | ICD-10-CM | POA: Diagnosis not present

## 2014-11-20 DIAGNOSIS — M75112 Incomplete rotator cuff tear or rupture of left shoulder, not specified as traumatic: Secondary | ICD-10-CM | POA: Diagnosis not present

## 2014-11-20 DIAGNOSIS — M6281 Muscle weakness (generalized): Secondary | ICD-10-CM | POA: Diagnosis not present

## 2014-11-20 DIAGNOSIS — M25512 Pain in left shoulder: Secondary | ICD-10-CM | POA: Diagnosis not present

## 2014-11-23 DIAGNOSIS — M25512 Pain in left shoulder: Secondary | ICD-10-CM | POA: Diagnosis not present

## 2014-11-23 DIAGNOSIS — M75112 Incomplete rotator cuff tear or rupture of left shoulder, not specified as traumatic: Secondary | ICD-10-CM | POA: Diagnosis not present

## 2014-11-23 DIAGNOSIS — M6281 Muscle weakness (generalized): Secondary | ICD-10-CM | POA: Diagnosis not present

## 2014-11-28 DIAGNOSIS — M25512 Pain in left shoulder: Secondary | ICD-10-CM | POA: Diagnosis not present

## 2014-11-28 DIAGNOSIS — M75112 Incomplete rotator cuff tear or rupture of left shoulder, not specified as traumatic: Secondary | ICD-10-CM | POA: Diagnosis not present

## 2014-11-28 DIAGNOSIS — M6281 Muscle weakness (generalized): Secondary | ICD-10-CM | POA: Diagnosis not present

## 2014-11-30 DIAGNOSIS — M6281 Muscle weakness (generalized): Secondary | ICD-10-CM | POA: Diagnosis not present

## 2014-11-30 DIAGNOSIS — M25512 Pain in left shoulder: Secondary | ICD-10-CM | POA: Diagnosis not present

## 2014-11-30 DIAGNOSIS — M75112 Incomplete rotator cuff tear or rupture of left shoulder, not specified as traumatic: Secondary | ICD-10-CM | POA: Diagnosis not present

## 2014-12-04 DIAGNOSIS — M6281 Muscle weakness (generalized): Secondary | ICD-10-CM | POA: Diagnosis not present

## 2014-12-04 DIAGNOSIS — M25512 Pain in left shoulder: Secondary | ICD-10-CM | POA: Diagnosis not present

## 2014-12-04 DIAGNOSIS — M75112 Incomplete rotator cuff tear or rupture of left shoulder, not specified as traumatic: Secondary | ICD-10-CM | POA: Diagnosis not present

## 2014-12-07 DIAGNOSIS — M6281 Muscle weakness (generalized): Secondary | ICD-10-CM | POA: Diagnosis not present

## 2014-12-07 DIAGNOSIS — M25512 Pain in left shoulder: Secondary | ICD-10-CM | POA: Diagnosis not present

## 2014-12-07 DIAGNOSIS — M75112 Incomplete rotator cuff tear or rupture of left shoulder, not specified as traumatic: Secondary | ICD-10-CM | POA: Diagnosis not present

## 2014-12-11 DIAGNOSIS — M25512 Pain in left shoulder: Secondary | ICD-10-CM | POA: Diagnosis not present

## 2014-12-11 DIAGNOSIS — M6281 Muscle weakness (generalized): Secondary | ICD-10-CM | POA: Diagnosis not present

## 2014-12-11 DIAGNOSIS — M75112 Incomplete rotator cuff tear or rupture of left shoulder, not specified as traumatic: Secondary | ICD-10-CM | POA: Diagnosis not present

## 2014-12-15 DIAGNOSIS — M25512 Pain in left shoulder: Secondary | ICD-10-CM | POA: Diagnosis not present

## 2014-12-15 DIAGNOSIS — M6281 Muscle weakness (generalized): Secondary | ICD-10-CM | POA: Diagnosis not present

## 2014-12-15 DIAGNOSIS — M75112 Incomplete rotator cuff tear or rupture of left shoulder, not specified as traumatic: Secondary | ICD-10-CM | POA: Diagnosis not present

## 2014-12-15 DIAGNOSIS — Z4789 Encounter for other orthopedic aftercare: Secondary | ICD-10-CM | POA: Diagnosis not present

## 2014-12-18 DIAGNOSIS — M75112 Incomplete rotator cuff tear or rupture of left shoulder, not specified as traumatic: Secondary | ICD-10-CM | POA: Diagnosis not present

## 2014-12-18 DIAGNOSIS — M6281 Muscle weakness (generalized): Secondary | ICD-10-CM | POA: Diagnosis not present

## 2014-12-18 DIAGNOSIS — M25512 Pain in left shoulder: Secondary | ICD-10-CM | POA: Diagnosis not present

## 2014-12-21 DIAGNOSIS — M25512 Pain in left shoulder: Secondary | ICD-10-CM | POA: Diagnosis not present

## 2014-12-21 DIAGNOSIS — M6281 Muscle weakness (generalized): Secondary | ICD-10-CM | POA: Diagnosis not present

## 2014-12-21 DIAGNOSIS — M75112 Incomplete rotator cuff tear or rupture of left shoulder, not specified as traumatic: Secondary | ICD-10-CM | POA: Diagnosis not present

## 2014-12-25 DIAGNOSIS — M6281 Muscle weakness (generalized): Secondary | ICD-10-CM | POA: Diagnosis not present

## 2014-12-25 DIAGNOSIS — M25512 Pain in left shoulder: Secondary | ICD-10-CM | POA: Diagnosis not present

## 2014-12-25 DIAGNOSIS — M75112 Incomplete rotator cuff tear or rupture of left shoulder, not specified as traumatic: Secondary | ICD-10-CM | POA: Diagnosis not present

## 2014-12-27 DIAGNOSIS — E1165 Type 2 diabetes mellitus with hyperglycemia: Secondary | ICD-10-CM | POA: Diagnosis not present

## 2014-12-27 DIAGNOSIS — M1990 Unspecified osteoarthritis, unspecified site: Secondary | ICD-10-CM | POA: Diagnosis not present

## 2014-12-27 DIAGNOSIS — E780 Pure hypercholesterolemia: Secondary | ICD-10-CM | POA: Diagnosis not present

## 2014-12-27 DIAGNOSIS — K219 Gastro-esophageal reflux disease without esophagitis: Secondary | ICD-10-CM | POA: Diagnosis not present

## 2014-12-27 DIAGNOSIS — E782 Mixed hyperlipidemia: Secondary | ICD-10-CM | POA: Diagnosis not present

## 2014-12-28 DIAGNOSIS — M6281 Muscle weakness (generalized): Secondary | ICD-10-CM | POA: Diagnosis not present

## 2014-12-28 DIAGNOSIS — M25512 Pain in left shoulder: Secondary | ICD-10-CM | POA: Diagnosis not present

## 2014-12-28 DIAGNOSIS — M75112 Incomplete rotator cuff tear or rupture of left shoulder, not specified as traumatic: Secondary | ICD-10-CM | POA: Diagnosis not present

## 2015-01-02 DIAGNOSIS — M6281 Muscle weakness (generalized): Secondary | ICD-10-CM | POA: Diagnosis not present

## 2015-01-02 DIAGNOSIS — S92426A Nondisplaced fracture of distal phalanx of unspecified great toe, initial encounter for closed fracture: Secondary | ICD-10-CM | POA: Diagnosis not present

## 2015-01-02 DIAGNOSIS — M25512 Pain in left shoulder: Secondary | ICD-10-CM | POA: Diagnosis not present

## 2015-01-02 DIAGNOSIS — M75112 Incomplete rotator cuff tear or rupture of left shoulder, not specified as traumatic: Secondary | ICD-10-CM | POA: Diagnosis not present

## 2015-01-03 DIAGNOSIS — J309 Allergic rhinitis, unspecified: Secondary | ICD-10-CM | POA: Diagnosis not present

## 2015-01-03 DIAGNOSIS — J4520 Mild intermittent asthma, uncomplicated: Secondary | ICD-10-CM | POA: Diagnosis not present

## 2015-01-03 DIAGNOSIS — J189 Pneumonia, unspecified organism: Secondary | ICD-10-CM | POA: Diagnosis not present

## 2015-01-03 DIAGNOSIS — K5190 Ulcerative colitis, unspecified, without complications: Secondary | ICD-10-CM | POA: Diagnosis not present

## 2015-01-03 DIAGNOSIS — E782 Mixed hyperlipidemia: Secondary | ICD-10-CM | POA: Diagnosis not present

## 2015-01-03 DIAGNOSIS — N189 Chronic kidney disease, unspecified: Secondary | ICD-10-CM | POA: Diagnosis not present

## 2015-01-03 DIAGNOSIS — G4733 Obstructive sleep apnea (adult) (pediatric): Secondary | ICD-10-CM | POA: Diagnosis not present

## 2015-01-03 DIAGNOSIS — E119 Type 2 diabetes mellitus without complications: Secondary | ICD-10-CM | POA: Diagnosis not present

## 2015-01-03 DIAGNOSIS — F329 Major depressive disorder, single episode, unspecified: Secondary | ICD-10-CM | POA: Diagnosis not present

## 2015-01-03 DIAGNOSIS — G4700 Insomnia, unspecified: Secondary | ICD-10-CM | POA: Diagnosis not present

## 2015-01-03 DIAGNOSIS — M1990 Unspecified osteoarthritis, unspecified site: Secondary | ICD-10-CM | POA: Diagnosis not present

## 2015-01-03 DIAGNOSIS — K219 Gastro-esophageal reflux disease without esophagitis: Secondary | ICD-10-CM | POA: Diagnosis not present

## 2015-01-04 DIAGNOSIS — M25512 Pain in left shoulder: Secondary | ICD-10-CM | POA: Diagnosis not present

## 2015-01-04 DIAGNOSIS — M6281 Muscle weakness (generalized): Secondary | ICD-10-CM | POA: Diagnosis not present

## 2015-01-04 DIAGNOSIS — M75112 Incomplete rotator cuff tear or rupture of left shoulder, not specified as traumatic: Secondary | ICD-10-CM | POA: Diagnosis not present

## 2015-01-09 DIAGNOSIS — M6281 Muscle weakness (generalized): Secondary | ICD-10-CM | POA: Diagnosis not present

## 2015-01-09 DIAGNOSIS — M25512 Pain in left shoulder: Secondary | ICD-10-CM | POA: Diagnosis not present

## 2015-01-09 DIAGNOSIS — M75112 Incomplete rotator cuff tear or rupture of left shoulder, not specified as traumatic: Secondary | ICD-10-CM | POA: Diagnosis not present

## 2015-01-11 DIAGNOSIS — M6281 Muscle weakness (generalized): Secondary | ICD-10-CM | POA: Diagnosis not present

## 2015-01-11 DIAGNOSIS — M25512 Pain in left shoulder: Secondary | ICD-10-CM | POA: Diagnosis not present

## 2015-01-11 DIAGNOSIS — M75112 Incomplete rotator cuff tear or rupture of left shoulder, not specified as traumatic: Secondary | ICD-10-CM | POA: Diagnosis not present

## 2015-01-15 DIAGNOSIS — M25512 Pain in left shoulder: Secondary | ICD-10-CM | POA: Diagnosis not present

## 2015-01-15 DIAGNOSIS — M75112 Incomplete rotator cuff tear or rupture of left shoulder, not specified as traumatic: Secondary | ICD-10-CM | POA: Diagnosis not present

## 2015-01-15 DIAGNOSIS — M6281 Muscle weakness (generalized): Secondary | ICD-10-CM | POA: Diagnosis not present

## 2015-01-17 DIAGNOSIS — M25512 Pain in left shoulder: Secondary | ICD-10-CM | POA: Diagnosis not present

## 2015-01-17 DIAGNOSIS — M6281 Muscle weakness (generalized): Secondary | ICD-10-CM | POA: Diagnosis not present

## 2015-01-17 DIAGNOSIS — M75112 Incomplete rotator cuff tear or rupture of left shoulder, not specified as traumatic: Secondary | ICD-10-CM | POA: Diagnosis not present

## 2015-01-22 DIAGNOSIS — M75112 Incomplete rotator cuff tear or rupture of left shoulder, not specified as traumatic: Secondary | ICD-10-CM | POA: Diagnosis not present

## 2015-01-22 DIAGNOSIS — M25512 Pain in left shoulder: Secondary | ICD-10-CM | POA: Diagnosis not present

## 2015-01-22 DIAGNOSIS — M6281 Muscle weakness (generalized): Secondary | ICD-10-CM | POA: Diagnosis not present

## 2015-01-24 DIAGNOSIS — L570 Actinic keratosis: Secondary | ICD-10-CM | POA: Diagnosis not present

## 2015-01-24 DIAGNOSIS — L818 Other specified disorders of pigmentation: Secondary | ICD-10-CM | POA: Diagnosis not present

## 2015-01-26 DIAGNOSIS — M6281 Muscle weakness (generalized): Secondary | ICD-10-CM | POA: Diagnosis not present

## 2015-01-26 DIAGNOSIS — M75112 Incomplete rotator cuff tear or rupture of left shoulder, not specified as traumatic: Secondary | ICD-10-CM | POA: Diagnosis not present

## 2015-01-26 DIAGNOSIS — S92426A Nondisplaced fracture of distal phalanx of unspecified great toe, initial encounter for closed fracture: Secondary | ICD-10-CM | POA: Diagnosis not present

## 2015-01-26 DIAGNOSIS — M25512 Pain in left shoulder: Secondary | ICD-10-CM | POA: Diagnosis not present

## 2015-01-30 DIAGNOSIS — M6281 Muscle weakness (generalized): Secondary | ICD-10-CM | POA: Diagnosis not present

## 2015-01-30 DIAGNOSIS — M75112 Incomplete rotator cuff tear or rupture of left shoulder, not specified as traumatic: Secondary | ICD-10-CM | POA: Diagnosis not present

## 2015-01-30 DIAGNOSIS — M25512 Pain in left shoulder: Secondary | ICD-10-CM | POA: Diagnosis not present

## 2015-02-01 DIAGNOSIS — M75112 Incomplete rotator cuff tear or rupture of left shoulder, not specified as traumatic: Secondary | ICD-10-CM | POA: Diagnosis not present

## 2015-02-01 DIAGNOSIS — M25512 Pain in left shoulder: Secondary | ICD-10-CM | POA: Diagnosis not present

## 2015-02-01 DIAGNOSIS — M6281 Muscle weakness (generalized): Secondary | ICD-10-CM | POA: Diagnosis not present

## 2015-02-07 DIAGNOSIS — M6281 Muscle weakness (generalized): Secondary | ICD-10-CM | POA: Diagnosis not present

## 2015-02-07 DIAGNOSIS — M25512 Pain in left shoulder: Secondary | ICD-10-CM | POA: Diagnosis not present

## 2015-02-07 DIAGNOSIS — M75112 Incomplete rotator cuff tear or rupture of left shoulder, not specified as traumatic: Secondary | ICD-10-CM | POA: Diagnosis not present

## 2015-02-14 DIAGNOSIS — M75112 Incomplete rotator cuff tear or rupture of left shoulder, not specified as traumatic: Secondary | ICD-10-CM | POA: Diagnosis not present

## 2015-02-14 DIAGNOSIS — M6281 Muscle weakness (generalized): Secondary | ICD-10-CM | POA: Diagnosis not present

## 2015-02-14 DIAGNOSIS — M25512 Pain in left shoulder: Secondary | ICD-10-CM | POA: Diagnosis not present

## 2015-02-19 DIAGNOSIS — M25512 Pain in left shoulder: Secondary | ICD-10-CM | POA: Diagnosis not present

## 2015-02-19 DIAGNOSIS — M6281 Muscle weakness (generalized): Secondary | ICD-10-CM | POA: Diagnosis not present

## 2015-02-19 DIAGNOSIS — M75112 Incomplete rotator cuff tear or rupture of left shoulder, not specified as traumatic: Secondary | ICD-10-CM | POA: Diagnosis not present

## 2015-02-22 DIAGNOSIS — M6281 Muscle weakness (generalized): Secondary | ICD-10-CM | POA: Diagnosis not present

## 2015-02-22 DIAGNOSIS — M75112 Incomplete rotator cuff tear or rupture of left shoulder, not specified as traumatic: Secondary | ICD-10-CM | POA: Diagnosis not present

## 2015-02-22 DIAGNOSIS — M25512 Pain in left shoulder: Secondary | ICD-10-CM | POA: Diagnosis not present

## 2015-02-23 DIAGNOSIS — M25512 Pain in left shoulder: Secondary | ICD-10-CM | POA: Diagnosis not present

## 2015-02-23 DIAGNOSIS — S92425A Nondisplaced fracture of distal phalanx of left great toe, initial encounter for closed fracture: Secondary | ICD-10-CM | POA: Diagnosis not present

## 2015-02-27 DIAGNOSIS — H5203 Hypermetropia, bilateral: Secondary | ICD-10-CM | POA: Diagnosis not present

## 2015-02-27 DIAGNOSIS — H43813 Vitreous degeneration, bilateral: Secondary | ICD-10-CM | POA: Diagnosis not present

## 2015-02-27 DIAGNOSIS — H1013 Acute atopic conjunctivitis, bilateral: Secondary | ICD-10-CM | POA: Diagnosis not present

## 2015-02-27 DIAGNOSIS — H524 Presbyopia: Secondary | ICD-10-CM | POA: Diagnosis not present

## 2015-02-27 DIAGNOSIS — E119 Type 2 diabetes mellitus without complications: Secondary | ICD-10-CM | POA: Diagnosis not present

## 2015-02-27 DIAGNOSIS — H2513 Age-related nuclear cataract, bilateral: Secondary | ICD-10-CM | POA: Diagnosis not present

## 2015-02-27 DIAGNOSIS — H52223 Regular astigmatism, bilateral: Secondary | ICD-10-CM | POA: Diagnosis not present

## 2015-03-05 ENCOUNTER — Other Ambulatory Visit: Payer: Self-pay

## 2015-03-06 MED ORDER — ESOMEPRAZOLE MAGNESIUM 40 MG PO CPDR: 40.0000 mg | DELAYED_RELEASE_CAPSULE | Freq: Every day | ORAL | Status: DC

## 2015-03-06 MED ORDER — BALSALAZIDE DISODIUM 750 MG PO CAPS: 2250.0000 mg | ORAL_CAPSULE | Freq: Two times a day (BID) | ORAL | Status: DC

## 2015-03-09 DIAGNOSIS — Z1231 Encounter for screening mammogram for malignant neoplasm of breast: Secondary | ICD-10-CM | POA: Diagnosis not present

## 2015-03-22 DIAGNOSIS — Z803 Family history of malignant neoplasm of breast: Secondary | ICD-10-CM | POA: Diagnosis not present

## 2015-03-22 DIAGNOSIS — Z315 Encounter for genetic counseling: Secondary | ICD-10-CM | POA: Diagnosis not present

## 2015-03-22 DIAGNOSIS — Z1379 Encounter for other screening for genetic and chromosomal anomalies: Secondary | ICD-10-CM | POA: Diagnosis not present

## 2015-03-26 DIAGNOSIS — Z01419 Encounter for gynecological examination (general) (routine) without abnormal findings: Secondary | ICD-10-CM | POA: Diagnosis not present

## 2015-03-27 DIAGNOSIS — E780 Pure hypercholesterolemia: Secondary | ICD-10-CM | POA: Diagnosis not present

## 2015-03-27 DIAGNOSIS — E782 Mixed hyperlipidemia: Secondary | ICD-10-CM | POA: Diagnosis not present

## 2015-03-27 DIAGNOSIS — E1165 Type 2 diabetes mellitus with hyperglycemia: Secondary | ICD-10-CM | POA: Diagnosis not present

## 2015-03-27 DIAGNOSIS — K219 Gastro-esophageal reflux disease without esophagitis: Secondary | ICD-10-CM | POA: Diagnosis not present

## 2015-03-27 DIAGNOSIS — N189 Chronic kidney disease, unspecified: Secondary | ICD-10-CM | POA: Diagnosis not present

## 2015-04-03 DIAGNOSIS — N189 Chronic kidney disease, unspecified: Secondary | ICD-10-CM | POA: Diagnosis not present

## 2015-04-03 DIAGNOSIS — E119 Type 2 diabetes mellitus without complications: Secondary | ICD-10-CM | POA: Diagnosis not present

## 2015-04-03 DIAGNOSIS — J4520 Mild intermittent asthma, uncomplicated: Secondary | ICD-10-CM | POA: Diagnosis not present

## 2015-04-03 DIAGNOSIS — G4733 Obstructive sleep apnea (adult) (pediatric): Secondary | ICD-10-CM | POA: Diagnosis not present

## 2015-04-03 DIAGNOSIS — G4700 Insomnia, unspecified: Secondary | ICD-10-CM | POA: Diagnosis not present

## 2015-04-03 DIAGNOSIS — M1990 Unspecified osteoarthritis, unspecified site: Secondary | ICD-10-CM | POA: Diagnosis not present

## 2015-04-03 DIAGNOSIS — J309 Allergic rhinitis, unspecified: Secondary | ICD-10-CM | POA: Diagnosis not present

## 2015-04-03 DIAGNOSIS — K5190 Ulcerative colitis, unspecified, without complications: Secondary | ICD-10-CM | POA: Diagnosis not present

## 2015-04-03 DIAGNOSIS — K219 Gastro-esophageal reflux disease without esophagitis: Secondary | ICD-10-CM | POA: Diagnosis not present

## 2015-04-03 DIAGNOSIS — F329 Major depressive disorder, single episode, unspecified: Secondary | ICD-10-CM | POA: Diagnosis not present

## 2015-04-03 DIAGNOSIS — B370 Candidal stomatitis: Secondary | ICD-10-CM | POA: Diagnosis not present

## 2015-04-03 DIAGNOSIS — E782 Mixed hyperlipidemia: Secondary | ICD-10-CM | POA: Diagnosis not present

## 2015-04-09 DIAGNOSIS — M25562 Pain in left knee: Secondary | ICD-10-CM | POA: Diagnosis not present

## 2015-04-09 DIAGNOSIS — S8002XA Contusion of left knee, initial encounter: Secondary | ICD-10-CM | POA: Diagnosis not present

## 2015-04-09 DIAGNOSIS — M7052 Other bursitis of knee, left knee: Secondary | ICD-10-CM | POA: Diagnosis not present

## 2015-05-07 DIAGNOSIS — M25562 Pain in left knee: Secondary | ICD-10-CM | POA: Diagnosis not present

## 2015-05-07 DIAGNOSIS — M7052 Other bursitis of knee, left knee: Secondary | ICD-10-CM | POA: Diagnosis not present

## 2015-05-07 DIAGNOSIS — S8002XA Contusion of left knee, initial encounter: Secondary | ICD-10-CM | POA: Diagnosis not present

## 2015-05-18 DIAGNOSIS — J019 Acute sinusitis, unspecified: Secondary | ICD-10-CM | POA: Diagnosis not present

## 2015-05-28 DIAGNOSIS — S8002XD Contusion of left knee, subsequent encounter: Secondary | ICD-10-CM | POA: Diagnosis not present

## 2015-05-28 DIAGNOSIS — M25562 Pain in left knee: Secondary | ICD-10-CM | POA: Diagnosis not present

## 2015-06-29 DIAGNOSIS — E1165 Type 2 diabetes mellitus with hyperglycemia: Secondary | ICD-10-CM | POA: Diagnosis not present

## 2015-06-29 DIAGNOSIS — K219 Gastro-esophageal reflux disease without esophagitis: Secondary | ICD-10-CM | POA: Diagnosis not present

## 2015-06-29 DIAGNOSIS — N189 Chronic kidney disease, unspecified: Secondary | ICD-10-CM | POA: Diagnosis not present

## 2015-06-29 DIAGNOSIS — E782 Mixed hyperlipidemia: Secondary | ICD-10-CM | POA: Diagnosis not present

## 2015-07-06 DIAGNOSIS — M1990 Unspecified osteoarthritis, unspecified site: Secondary | ICD-10-CM | POA: Diagnosis not present

## 2015-07-06 DIAGNOSIS — E119 Type 2 diabetes mellitus without complications: Secondary | ICD-10-CM | POA: Diagnosis not present

## 2015-07-06 DIAGNOSIS — J309 Allergic rhinitis, unspecified: Secondary | ICD-10-CM | POA: Diagnosis not present

## 2015-07-06 DIAGNOSIS — N189 Chronic kidney disease, unspecified: Secondary | ICD-10-CM | POA: Diagnosis not present

## 2015-07-06 DIAGNOSIS — G4700 Insomnia, unspecified: Secondary | ICD-10-CM | POA: Diagnosis not present

## 2015-07-06 DIAGNOSIS — K219 Gastro-esophageal reflux disease without esophagitis: Secondary | ICD-10-CM | POA: Diagnosis not present

## 2015-07-06 DIAGNOSIS — F329 Major depressive disorder, single episode, unspecified: Secondary | ICD-10-CM | POA: Diagnosis not present

## 2015-07-06 DIAGNOSIS — G4733 Obstructive sleep apnea (adult) (pediatric): Secondary | ICD-10-CM | POA: Diagnosis not present

## 2015-07-06 DIAGNOSIS — E782 Mixed hyperlipidemia: Secondary | ICD-10-CM | POA: Diagnosis not present

## 2015-07-06 DIAGNOSIS — J4520 Mild intermittent asthma, uncomplicated: Secondary | ICD-10-CM | POA: Diagnosis not present

## 2015-07-06 DIAGNOSIS — K5190 Ulcerative colitis, unspecified, without complications: Secondary | ICD-10-CM | POA: Diagnosis not present

## 2015-07-06 DIAGNOSIS — Z23 Encounter for immunization: Secondary | ICD-10-CM | POA: Diagnosis not present

## 2015-07-09 DIAGNOSIS — M5440 Lumbago with sciatica, unspecified side: Secondary | ICD-10-CM | POA: Diagnosis not present

## 2015-07-09 DIAGNOSIS — M4306 Spondylolysis, lumbar region: Secondary | ICD-10-CM | POA: Diagnosis not present

## 2015-07-09 DIAGNOSIS — M5136 Other intervertebral disc degeneration, lumbar region: Secondary | ICD-10-CM | POA: Diagnosis not present

## 2015-07-16 DIAGNOSIS — M5136 Other intervertebral disc degeneration, lumbar region: Secondary | ICD-10-CM | POA: Diagnosis not present

## 2015-07-16 DIAGNOSIS — M47816 Spondylosis without myelopathy or radiculopathy, lumbar region: Secondary | ICD-10-CM | POA: Diagnosis not present

## 2015-07-16 DIAGNOSIS — M5126 Other intervertebral disc displacement, lumbar region: Secondary | ICD-10-CM | POA: Diagnosis not present

## 2015-07-16 DIAGNOSIS — M79604 Pain in right leg: Secondary | ICD-10-CM | POA: Diagnosis not present

## 2015-07-18 DIAGNOSIS — M810 Age-related osteoporosis without current pathological fracture: Secondary | ICD-10-CM | POA: Diagnosis not present

## 2015-07-18 DIAGNOSIS — E2839 Other primary ovarian failure: Secondary | ICD-10-CM | POA: Diagnosis not present

## 2015-07-18 DIAGNOSIS — Z8262 Family history of osteoporosis: Secondary | ICD-10-CM | POA: Diagnosis not present

## 2015-07-18 DIAGNOSIS — F419 Anxiety disorder, unspecified: Secondary | ICD-10-CM | POA: Diagnosis not present

## 2015-07-18 DIAGNOSIS — J449 Chronic obstructive pulmonary disease, unspecified: Secondary | ICD-10-CM | POA: Diagnosis not present

## 2015-07-18 DIAGNOSIS — E119 Type 2 diabetes mellitus without complications: Secondary | ICD-10-CM | POA: Diagnosis not present

## 2015-07-18 DIAGNOSIS — Z7982 Long term (current) use of aspirin: Secondary | ICD-10-CM | POA: Diagnosis not present

## 2015-07-18 DIAGNOSIS — K219 Gastro-esophageal reflux disease without esophagitis: Secondary | ICD-10-CM | POA: Diagnosis not present

## 2015-07-18 DIAGNOSIS — M8589 Other specified disorders of bone density and structure, multiple sites: Secondary | ICD-10-CM | POA: Diagnosis not present

## 2015-07-18 DIAGNOSIS — Z7984 Long term (current) use of oral hypoglycemic drugs: Secondary | ICD-10-CM | POA: Diagnosis not present

## 2015-07-18 DIAGNOSIS — K5190 Ulcerative colitis, unspecified, without complications: Secondary | ICD-10-CM | POA: Diagnosis not present

## 2015-07-18 DIAGNOSIS — Z79899 Other long term (current) drug therapy: Secondary | ICD-10-CM | POA: Diagnosis not present

## 2015-07-18 DIAGNOSIS — Z7951 Long term (current) use of inhaled steroids: Secondary | ICD-10-CM | POA: Diagnosis not present

## 2015-07-18 DIAGNOSIS — F17200 Nicotine dependence, unspecified, uncomplicated: Secondary | ICD-10-CM | POA: Diagnosis not present

## 2015-07-18 DIAGNOSIS — Z780 Asymptomatic menopausal state: Secondary | ICD-10-CM | POA: Diagnosis not present

## 2015-07-24 ENCOUNTER — Encounter: Payer: Self-pay | Admitting: Cardiology

## 2015-07-24 ENCOUNTER — Ambulatory Visit (INDEPENDENT_AMBULATORY_CARE_PROVIDER_SITE_OTHER): Payer: Medicare Other | Admitting: Cardiology

## 2015-07-24 VITALS — BP 138/92 | HR 86 | Ht 64.0 in | Wt 187.0 lb

## 2015-07-24 DIAGNOSIS — R079 Chest pain, unspecified: Secondary | ICD-10-CM

## 2015-07-24 DIAGNOSIS — Z136 Encounter for screening for cardiovascular disorders: Secondary | ICD-10-CM

## 2015-07-24 NOTE — Patient Instructions (Signed)

## 2015-07-24 NOTE — Progress Notes (Addendum)
Patient ID: [name], female   DOB: Apr 23, 1951, 64 y.o.   MRN: [id number]     Clinical Summary [name] is a 64 y.o.female seen today for follow up of the following medical problems.   1. Chest pain - history of atypical chest pain.  - 06/2014 Lexiscan MPI without ischemia - she has had prior EGDs with dilatation as well that has previously improved her symptoms. .   - continues to note some chest pain still at times. Typically throbbing pain that can be in different parts of her chest, not positional. No other associated symptoms. Lasts up to 1 hour. Unchanged from a year ago.   2. Hyperlipidemia - tolerating pravastatin well, previous muscle aches on lipitor - 05/2015: TC 137 TG 118 HDL 42 LDL 71  Past Medical History  Diagnosis Date  . Depression   . Ulcerative colitis 2007  . GERD (gastroesophageal reflux disease)   . Pancreatitis   . COPD (chronic obstructive pulmonary disease)   . Diverticulitis   . Schatzki's ring   . DDD (degenerative disc disease)   . Chronic back pain   . Arthritis   . PVC's (premature ventricular contractions)   . Hiatal hernia   . Wears dentures     full top-partial bottom  . HOH (hard of hearing)     right  . Degenerative tear of posterior horn of lateral meniscus of right knee 01/27/2014  . Sleep apnea     CPAP at night  . Diabetes mellitus without complication   . PONV (postoperative nausea and vomiting)     only happened after knee arthroscopy     Allergies  Allergen Reactions  . Aspirin Other (See Comments)    Affects the central nervous system. "shakey"  . Cefprozil Other (See Comments)    Messes with Ulcerative colitis   . Cefuroxime Axetil Other (See Comments)    Messes with Ulcerative colitis   . Clarithromycin Nausea And Vomiting  . Codeine Other (See Comments)    Hallucinations/bad dreams.  . Entex Other (See Comments)    insomnia  . Mesalamine Other (See Comments)    Pancreatitis   . Morphine And Related  Hives  . Penicillins Hives     Current Outpatient Prescriptions  Medication Sig Dispense Refill  . albuterol (PROVENTIL) (2.5 MG/3ML) 0.083% nebulizer solution Take 2.5 mg by nebulization every 6 (six) hours as needed for wheezing or shortness of breath.    Marland Kitchen albuterol (PROVENTIL,VENTOLIN) 90 MCG/ACT inhaler Inhale 2 puffs into the lungs every 4 (four) hours as needed.      Marland Kitchen aspirin 81 MG tablet Take 81 mg by mouth daily.      Marland Kitchen azelastine (ASTELIN) 137 MCG/SPRAY nasal spray 2 sprays by Nasal route 2 (two) times daily. Use in each nostril as directed     . balsalazide (COLAZAL) 750 MG capsule Take 3 capsules (2,250 mg total) by mouth 2 (two) times daily. 540 capsule 3  . buPROPion (WELLBUTRIN SR) 150 MG 12 hr tablet Take 150 mg by mouth daily.    . busPIRone (BUSPAR) 15 MG tablet Take 7.5 mg by mouth 2 (two) times daily. 1/2 tablet bid    . Calcium Carb-Cholecalciferol (CALCIUM-VITAMIN D) 600-400 MG-UNIT TABS Take 1 tablet by mouth 2 (two) times daily.    . cetirizine (ZYRTEC) 10 MG chewable tablet Chew 10 mg by mouth daily.      . DULoxetine (CYMBALTA) 60 MG capsule Take 60 mg by mouth daily.      Marland Kitchen  esomeprazole (NEXIUM) 40 MG capsule Take 1 capsule (40 mg total) by mouth daily before breakfast. 90 capsule 3  . famotidine-calcium carbonate-magnesium hydroxide (PEPCID COMPLETE) 10-800-165 MG CHEW chewable tablet Chew 1 tablet by mouth daily as needed.    . ferrous gluconate (FERGON) 225 (27 FE) MG tablet Take 240 mg by mouth daily.     . Fluticasone-Salmeterol (ADVAIR DISKUS) 100-50 MCG/DOSE AEPB Inhale 1 puff into the lungs every 12 (twelve) hours.      Marland Kitchen ketotifen (ALAWAY) 0.025 % ophthalmic solution Place 2 drops into both eyes 2 (two) times daily.    . metFORMIN (GLUCOPHAGE-XR) 500 MG 24 hr tablet Take 1,000 mg by mouth every evening.     . methylcellulose (CITRUCEL) oral powder Take 1 packet by mouth 2 (two) times daily.    . pravastatin (PRAVACHOL) 20 MG tablet Take 1 tablet (20 mg  total) by mouth every evening. 90 tablet 3  . pregabalin (LYRICA) 75 MG capsule Take 75 mg by mouth 2 (two) times daily.    . traMADol (ULTRAM-ER) 100 MG 24 hr tablet Take 100 mg by mouth at bedtime.    . traZODone (DESYREL) 100 MG tablet Take 100 mg by mouth at bedtime.       No current facility-administered medications for this visit.     Past Surgical History  Procedure Laterality Date  . Tonsillectomy    . Dilation and curettage of uterus    . Hernia repair    . Cholecystectomy    . Cervical cone biopsy    . Arch and foot    . Esophagogastroduodenoscopy  01/28/2011    Rourk- Schatzki ring, s/p 82F, otherwise normal/Hiatal hernia otherwise normal stomach D1 and D2  . Colonoscopy  12/03/05    Rourk-marked inflammatory changes of the rectum, left colon, pan colitis, internal hemorrhoids, sigmoid diverticula  . Esophagogastroduodenoscopy  11/30/08    Rourk-Schatzki's ring status post dilation, hiatal hernia  . Esophagogastroduodenoscopy  07/12/2007    Dilation with 56 French, Schatzki's ring  . Esophagogastroduodenoscopy (egd) with esophageal dilation  09/01/2012    Dr. Gala Romney- schatzki's ring, hiatal hernia  . Colonoscopy N/A 10/20/2013    Dr. Rourk:Colonic diverticulosis. colonic mucosal ulcerations involving segment of sigmoid colon, atypical for UC. sigmoid colon path with chronic mildly active colitis consistent with IBD, other biopsies including ascending, transverse, descending colon and rectum unremarkable.   . Foot arthrodesis  [id number]    both feet  . Knee arthroscopy with lateral menisectomy Right 01/27/2014    Procedure: RIGHT KNEE ARTHROSCOPY WITH PARTIAL LATERAL MENISECTOMY, ;  Surgeon: Johnny Bridge, MD;  Location: [place];  Service: Orthopedics;  Laterality: Right;  . Esophagogastroduodenoscopy N/A 08/10/2014    Procedure: ESOPHAGOGASTRODUODENOSCOPY (EGD);  Surgeon: Daneil Dolin, MD;  [facility];  Service: Endoscopy;  Laterality: N/A;   130   . Savory dilation N/A 08/10/2014    Procedure: SAVORY DILATION;  Surgeon: Daneil Dolin, MD;  [facility];  Service: Endoscopy;  Laterality: N/A;  [name] dilation N/A 08/10/2014    Procedure: [name] DILATION;  Surgeon: Daneil Dolin, MD;  [facility];  Service: Endoscopy;  Laterality: N/A;     Allergies  Allergen Reactions  . Aspirin Other (See Comments)    Affects the central nervous system. "shakey"  . Cefprozil Other (See Comments)    Messes with Ulcerative colitis   . Cefuroxime Axetil Other (See Comments)    Messes with Ulcerative colitis   .  Clarithromycin Nausea And Vomiting  . Codeine Other (See Comments)    Hallucinations/bad dreams.  . Entex Other (See Comments)    insomnia  . Mesalamine Other (See Comments)    Pancreatitis   . Morphine And Related Hives  . Penicillins Hives      Family History  Problem Relation Age of Onset  . Colon cancer Neg Hx      Social History [name] reports that she quit smoking about 22 months ago. Her smoking use included Cigarettes. She started smoking about 46 years ago. She has a 30 pack-year smoking history. She has never used smokeless tobacco. [name] reports that she does not drink alcohol.   Review of Systems CONSTITUTIONAL: No weight loss, fever, chills, weakness or fatigue.  HEENT: Eyes: No visual loss, blurred vision, double vision or yellow sclerae.No hearing loss, sneezing, congestion, runny nose or sore throat.  SKIN: No rash or itching.  CARDIOVASCULAR: per HPI RESPIRATORY: No shortness of breath, cough or sputum.  GASTROINTESTINAL: No anorexia, nausea, vomiting or diarrhea. No abdominal pain or blood.  GENITOURINARY: No burning on urination, no polyuria NEUROLOGICAL: No headache, dizziness, syncope, paralysis, ataxia, numbness or tingling in the extremities. No change in bowel or bladder control.  MUSCULOSKELETAL: No muscle, back pain, joint pain or stiffness.   LYMPHATICS: No enlarged nodes. No history of splenectomy.  PSYCHIATRIC: No history of depression or anxiety.  ENDOCRINOLOGIC: No reports of sweating, cold or heat intolerance. No polyuria or polydipsia.  Marland Kitchen   Physical Examination Filed Vitals:   07/24/15 1316  BP: 138/92  Pulse: 86   Filed Vitals:   07/24/15 1316  Height: 5' 4"  (1.626 m)  Weight: 187 lb (84.823 kg)   Manual bp: 128/75  Gen: resting comfortably, no acute distress HEENT: no scleral icterus, pupils equal round and reactive, no palptable cervical adenopathy,  CV: RRR, 2/6 systolic murmur LLSB, no jvd Resp: Clear to auscultation bilaterally GI: abdomen is soft, non-tender, non-distended, normal bowel sounds, no hepatosplenomegaly MSK: extremities are warm, no edema.  Skin: warm, no rash Neuro:  no focal deficits Psych: appropriate affect   Diagnostic Studies  06/2014 Lexiscan MPI IMPRESSION: 1. No reversible ischemia or infarction.  2. Normal left ventricular wall motion.  3. Left ventricular ejection fraction 60%  4. Low-risk stress test findings*.   07/2014 Echo Study Conclusions  - Left ventricle: The cavity size was normal. Wall thickness was normal. Systolic function was normal. The estimated ejection fraction was in the range of 60% to 65%. - Aortic valve: Valve area (VTI): 3.31 cm^2. Valve area (Vmax): 3.27 cm^2. Valve area (Vmean): 2.77 cm^2. - Left atrium: The atrium was mildly dilated. - Technically difficult study.   Assessment and Plan   1. Chest pain - atypical symptoms that are unchanged. Negative stress test from last year - continue risk factor modification.    2. Hyperlipidemia - at goal. Previous side effects on lipitor, tolerating pravastatin well.    F/u 1 year  Arnoldo Lenis, M.D.

## 2015-07-30 DIAGNOSIS — M5416 Radiculopathy, lumbar region: Secondary | ICD-10-CM | POA: Diagnosis not present

## 2015-08-15 ENCOUNTER — Encounter: Payer: Self-pay | Admitting: Internal Medicine

## 2015-08-20 DIAGNOSIS — M5440 Lumbago with sciatica, unspecified side: Secondary | ICD-10-CM | POA: Diagnosis not present

## 2015-08-28 DIAGNOSIS — M47816 Spondylosis without myelopathy or radiculopathy, lumbar region: Secondary | ICD-10-CM | POA: Diagnosis not present

## 2015-09-07 DIAGNOSIS — H9203 Otalgia, bilateral: Secondary | ICD-10-CM | POA: Diagnosis not present

## 2015-09-07 DIAGNOSIS — R05 Cough: Secondary | ICD-10-CM | POA: Diagnosis not present

## 2015-09-07 DIAGNOSIS — J029 Acute pharyngitis, unspecified: Secondary | ICD-10-CM | POA: Diagnosis not present

## 2015-09-11 ENCOUNTER — Other Ambulatory Visit: Payer: Self-pay

## 2015-09-11 ENCOUNTER — Ambulatory Visit (INDEPENDENT_AMBULATORY_CARE_PROVIDER_SITE_OTHER): Payer: Medicare Other | Admitting: Internal Medicine

## 2015-09-11 ENCOUNTER — Telehealth: Payer: Self-pay | Admitting: Internal Medicine

## 2015-09-11 ENCOUNTER — Encounter: Payer: Self-pay | Admitting: Internal Medicine

## 2015-09-11 VITALS — BP 126/75 | HR 88 | Temp 98.3°F | Ht 64.5 in | Wt 188.8 lb

## 2015-09-11 DIAGNOSIS — R1310 Dysphagia, unspecified: Secondary | ICD-10-CM | POA: Diagnosis not present

## 2015-09-11 DIAGNOSIS — K591 Functional diarrhea: Secondary | ICD-10-CM

## 2015-09-11 DIAGNOSIS — K219 Gastro-esophageal reflux disease without esophagitis: Secondary | ICD-10-CM

## 2015-09-11 DIAGNOSIS — K5180 Other ulcerative colitis without complications: Secondary | ICD-10-CM

## 2015-09-11 MED ORDER — ESOMEPRAZOLE MAGNESIUM 40 MG PO CPDR: 40.0000 mg | DELAYED_RELEASE_CAPSULE | Freq: Every day | ORAL | Status: DC

## 2015-09-11 MED ORDER — HYOSCYAMINE SULFATE 0.125 MG SL SUBL: 0.1250 mg | SUBLINGUAL_TABLET | Freq: Three times a day (TID) | SUBLINGUAL | Status: DC

## 2015-09-11 NOTE — Progress Notes (Signed)
Primary Care Physician:  Gar Ponto, MD Primary Gastroenterologist:  Dr. Gala Romney  Pre-Procedure History & Physical: HPI:  [name] is a 64 y.o. female here for follow-up of ulcerative: colitis GERD and Schatzki's ring. Esophageal dilation helped tremendously. More recently she's felt some vague insidious recurrent esophageal dysphagia. GERD well-controlled on Nexium. She has significant symptoms if she misses a dose. She has hiccups recurrent esophageal dysphagia these days. Weight is stable. She says about 6 days out of the she has intermittent abdominal cramps and diarrhea most of the time she is asymptomatic. Denies nonsteroidals.   Past Medical History  Diagnosis Date  . Depression   . Ulcerative colitis 2007  . GERD (gastroesophageal reflux disease)   . Pancreatitis   . COPD (chronic obstructive pulmonary disease) ([place])   . Diverticulitis   . Schatzki's ring   . DDD (degenerative disc disease)   . Chronic back pain   . Arthritis   . PVC's (premature ventricular contractions)   . Hiatal hernia   . Wears dentures     full top-partial bottom  . HOH (hard of hearing)     right  . Degenerative tear of posterior horn of lateral meniscus of right knee 01/27/2014  . Sleep apnea     CPAP at night  . Diabetes mellitus without complication ([place])   . PONV (postoperative nausea and vomiting)     only happened after knee arthroscopy    Past Surgical History  Procedure Laterality Date  . Tonsillectomy    . Dilation and curettage of uterus    . Hernia repair    . Cholecystectomy    . Cervical cone biopsy    . Arch and foot    . Esophagogastroduodenoscopy  01/28/2011    Punam Broussard- Schatzki ring, s/p 65F, otherwise normal/Hiatal hernia otherwise normal stomach D1 and D2  . Colonoscopy  12/03/05    Irania Durell-marked inflammatory changes of the rectum, left colon, pan colitis, internal hemorrhoids, sigmoid diverticula  . Esophagogastroduodenoscopy  11/30/08    Birdie Beveridge-Schatzki's ring  status post dilation, hiatal hernia  . Esophagogastroduodenoscopy  07/12/2007    Dilation with 56 French, Schatzki's ring  . Esophagogastroduodenoscopy (egd) with esophageal dilation  09/01/2012    Dr. Gala Romney- schatzki's ring, hiatal hernia  . Colonoscopy N/A 10/20/2013    Dr. Nalah Macioce:Colonic diverticulosis. colonic mucosal ulcerations involving segment of sigmoid colon, atypical for UC. sigmoid colon path with chronic mildly active colitis consistent with IBD, other biopsies including ascending, transverse, descending colon and rectum unremarkable.   . Foot arthrodesis  [id number]    both feet  . Knee arthroscopy with lateral menisectomy Right 01/27/2014    Procedure: RIGHT KNEE ARTHROSCOPY WITH PARTIAL LATERAL MENISECTOMY, ;  Surgeon: Johnny Bridge, MD;  Location: [facility];  Service: Orthopedics;  Laterality: Right;  . Esophagogastroduodenoscopy N/A 08/10/2014    Dr.Sagar Tengan- prominent schatzki's ring s/p maloney dilation and disruption, moderate sized hiatal hernia  . Savory dilation N/A 08/10/2014    Procedure: SAVORY DILATION;  Surgeon: Daneil Dolin, MD;  [facility];  Service: Endoscopy;  Laterality: N/A;  [name] dilation N/A 08/10/2014    Procedure: [name] DILATION;  Surgeon: Daneil Dolin, MD;  [facility];  Service: Endoscopy;  Laterality: N/A;    Prior to Admission medications   Medication Sig Start Date End Date Taking? Authorizing Provider  albuterol (PROVENTIL) (2.5 MG/3ML) 0.083% nebulizer solution Take 2.5 mg by nebulization every 6 (six) hours as needed for wheezing  or shortness of breath.   Yes Historical Provider, MD  albuterol (PROVENTIL,VENTOLIN) 90 MCG/ACT inhaler Inhale 2 puffs into the lungs every 4 (four) hours as needed.     Yes Historical Provider, MD  aspirin 81 MG tablet Take 81 mg by mouth daily.     Yes Historical Provider, MD  azelastine (ASTELIN) 137 MCG/SPRAY nasal spray 2 sprays by Nasal route 2 (two) times daily. Use in  each nostril as directed    Yes Historical Provider, MD  azithromycin (ZITHROMAX) 250 MG tablet  09/07/15  Yes Historical Provider, MD  balsalazide (COLAZAL) 750 MG capsule Take 3 capsules (2,250 mg total) by mouth 2 (two) times daily. 03/06/15  Yes Carlis Stable, NP  benzonatate (TESSALON) 100 MG capsule  09/07/15  Yes Historical Provider, MD  buPROPion (WELLBUTRIN SR) 150 MG 12 hr tablet Take 150 mg by mouth daily.   Yes Historical Provider, MD  busPIRone (BUSPAR) 15 MG tablet Take 7.5 mg by mouth 2 (two) times daily. 1/2 tablet bid   Yes Historical Provider, MD  Calcium Carb-Cholecalciferol (CALCIUM-VITAMIN D) 600-400 MG-UNIT TABS Take 1 tablet by mouth 2 (two) times daily.   Yes Historical Provider, MD  cetirizine (ZYRTEC) 10 MG chewable tablet Chew 10 mg by mouth daily.     Yes Historical Provider, MD  DULoxetine (CYMBALTA) 60 MG capsule Take 60 mg by mouth daily.     Yes Historical Provider, MD  esomeprazole (NEXIUM) 40 MG capsule Take 1 capsule (40 mg total) by mouth daily before breakfast. 03/06/15  Yes Carlis Stable, NP  famotidine-calcium carbonate-magnesium hydroxide (PEPCID COMPLETE) 10-800-165 MG CHEW chewable tablet Chew 1 tablet by mouth daily as needed.   Yes Historical Provider, MD  ferrous gluconate (FERGON) 225 (27 FE) MG tablet Take 240 mg by mouth daily.    Yes Historical Provider, MD  Fluticasone-Salmeterol (ADVAIR DISKUS) 100-50 MCG/DOSE AEPB Inhale 1 puff into the lungs every 12 (twelve) hours.     Yes Historical Provider, MD  ketotifen (ALAWAY) 0.025 % ophthalmic solution Place 2 drops into both eyes 2 (two) times daily.   Yes Historical Provider, MD  metFORMIN (GLUCOPHAGE-XR) 500 MG 24 hr tablet Take 1,000 mg by mouth every evening.    Yes Historical Provider, MD  methylcellulose (CITRUCEL) oral powder Take 1 packet by mouth 2 (two) times daily.   Yes Historical Provider, MD  pravastatin (PRAVACHOL) 20 MG tablet Take 1 tablet (20 mg total) by mouth every evening. 10/23/14  Yes Arnoldo Lenis, MD  pregabalin (LYRICA) 75 MG capsule Take 75 mg by mouth 2 (two) times daily.   Yes Historical Provider, MD  tiZANidine (ZANAFLEX) 4 MG tablet TAKE 1 TABLET BY MOUTH EVERY SIX TO EIGHT HOURS AS NEEDED FOR MUSCLE SPASMS 08/20/15  Yes Historical Provider, MD  traMADol (ULTRAM-ER) 100 MG 24 hr tablet Take 100 mg by mouth at bedtime.   Yes Historical Provider, MD  traZODone (DESYREL) 100 MG tablet Take 100 mg by mouth at bedtime.     Yes Historical Provider, MD    Allergies as of 09/11/2015 - Review Complete 09/11/2015  Allergen Reaction Noted  . Aspirin Other (See Comments)   . Cefprozil Other (See Comments)   . Cefuroxime axetil Other (See Comments) 08/08/2008  . Clarithromycin Nausea And Vomiting 08/08/2008  . Codeine Other (See Comments)   . Entex Other (See Comments) 08/08/2008  . Mesalamine Other (See Comments)   . Morphine and related Hives 08/23/2012  . Penicillins Hives 08/08/2008  Family History  Problem Relation Age of Onset  . Colon cancer Neg Hx     Social History   Social History  . Marital Status: Married    Spouse Name: N/A  . Number of Children: 3  . Years of Education: N/A   Occupational History  . 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 Smoking status: Former Smoker -- 1.00 packs/day for 30 years    Types: Cigarettes    Start date: 07/07/1969    Quit date: 08/29/2013  . Smokeless tobacco: Never Used  . Alcohol Use: No  . Drug Use: No  . Sexual Activity:    Partners: Male    Birth Control/ Protection: None     Comment: spouse   Other Topics Concern  . Not on file   Social History Narrative    Review of Systems: See HPI, otherwise negative ROS  Physical Exam: BP 126/75 mmHg  Pulse 88  Temp(Src) 98.3 F (36.8 C) (Oral)  Ht 5' 4.5" (1.638 m)  Wt 188 lb 12.8 oz (85.639 kg)  BMI 31.92 kg/m2 General:   Alert,  Well-developed, well-nourished, pleasant and cooperative in NAD Skin:  Intact without significant  lesions or rashes. Neck:  Supple; no masses or thyromegaly. No significant cervical adenopathy. Lungs:  Clear throughout to auscultation.   No wheezes, crackles, or rhonchi. No acute distress. Heart:  Regular rate and rhythm; no murmurs, clicks, rubs,  or gallops. Abdomen: obese.Non-distended, normal bowel sounds.  Soft and nontender without appreciable mass or hepatosplenomegaly.  Pulses:  Normal pulses noted. Extremities:  Without clubbing or edema.  Impression:  Ulcerative colitis felt to be in remission. She does have 5 or 6 days a month where she has abdominal cramps and nonbloody diarrhea at other times she is set up through asymptomatic from a standpoint of her bowel function. I suspect she has an element of irritable bowel diarrhea predominant  History of GERD intermittent recurrent dysphagia in the setting of a Schatzki's ring. Also ongoing hiccups. Multiple esophageal dilations previously   Recommendations:    Prescription for Levsin 0.125 mg tablets (disp 60) one under the tongue before meals and at bedtime as needed for abdominal cramps and diarrhea - 11 refills  Continue Colazal - no change in dose  BPE / upper GI series to further evaluate dysphagia and hiccups  OK to take a probiotic or a serving of yogurt daily  Avoid nonsteroidal agents.  Provide information on irritable bowel syndrome-diarrhea predominant  Further recommendations to follow.   Notice: This dictation was prepared with Dragon dictation along with smaller phrase technology. Any transcriptional errors that result from this process are unintentional and may not be corrected upon review.

## 2015-09-11 NOTE — Telephone Encounter (Signed)
Pt was seen today by RMR and she called back to let us know that her prescriptions need to go to [facility].

## 2015-09-11 NOTE — Patient Instructions (Addendum)
Prescription for Levsin 0.125 mg tablets (disp 60) one under the tongue before meals and at bedtime as needed for abdominal cramps and diarrhea - 11 refills  Continue Colazal - no change in dose  BPE / upper GI series to further evaluate dysphagia and hiccups  OK to take a probiotic or a serving of yogurt daily  Avoid nonsteroidal agents.  Provide information on irritable bowel syndrome-diarrhea predominant  Further recommendations to follow.

## 2015-09-11 NOTE — Telephone Encounter (Signed)
rx called in to [facility].

## 2015-09-17 ENCOUNTER — Ambulatory Visit (HOSPITAL_COMMUNITY)
Admission: RE | Admit: 2015-09-17 | Discharge: 2015-09-17 | Disposition: A | Discharge status: Home / Self Care | Payer: Medicare Other | Source: Ambulatory Visit | Attending: Internal Medicine | Admitting: Internal Medicine

## 2015-09-17 ENCOUNTER — Other Ambulatory Visit: Payer: Self-pay

## 2015-09-17 DIAGNOSIS — R1310 Dysphagia, unspecified: Secondary | ICD-10-CM

## 2015-09-17 DIAGNOSIS — K224 Dyskinesia of esophagus: Secondary | ICD-10-CM

## 2015-09-20 ENCOUNTER — Encounter (HOSPITAL_COMMUNITY)
Admission: RE | Disposition: A | Discharge status: Home / Self Care | Payer: Self-pay | Source: Ambulatory Visit | Attending: Internal Medicine

## 2015-09-20 ENCOUNTER — Encounter (HOSPITAL_COMMUNITY): Payer: Self-pay

## 2015-09-20 ENCOUNTER — Ambulatory Visit (HOSPITAL_COMMUNITY)
Admission: RE | Admit: 2015-09-20 | Discharge: 2015-09-20 | Disposition: A | Discharge status: Home / Self Care | Payer: Medicare Other | Source: Ambulatory Visit | Attending: Internal Medicine | Admitting: Internal Medicine

## 2015-09-20 DIAGNOSIS — J449 Chronic obstructive pulmonary disease, unspecified: Secondary | ICD-10-CM | POA: Insufficient documentation

## 2015-09-20 DIAGNOSIS — Z7982 Long term (current) use of aspirin: Secondary | ICD-10-CM | POA: Insufficient documentation

## 2015-09-20 DIAGNOSIS — Z79899 Other long term (current) drug therapy: Secondary | ICD-10-CM | POA: Diagnosis not present

## 2015-09-20 DIAGNOSIS — F329 Major depressive disorder, single episode, unspecified: Secondary | ICD-10-CM | POA: Insufficient documentation

## 2015-09-20 DIAGNOSIS — K222 Esophageal obstruction: Secondary | ICD-10-CM | POA: Diagnosis not present

## 2015-09-20 DIAGNOSIS — K219 Gastro-esophageal reflux disease without esophagitis: Secondary | ICD-10-CM | POA: Insufficient documentation

## 2015-09-20 DIAGNOSIS — K5190 Ulcerative colitis, unspecified, without complications: Secondary | ICD-10-CM | POA: Insufficient documentation

## 2015-09-20 DIAGNOSIS — R109 Unspecified abdominal pain: Secondary | ICD-10-CM | POA: Diagnosis not present

## 2015-09-20 DIAGNOSIS — K449 Diaphragmatic hernia without obstruction or gangrene: Secondary | ICD-10-CM | POA: Insufficient documentation

## 2015-09-20 DIAGNOSIS — Z87891 Personal history of nicotine dependence: Secondary | ICD-10-CM | POA: Insufficient documentation

## 2015-09-20 DIAGNOSIS — G4730 Sleep apnea, unspecified: Secondary | ICD-10-CM | POA: Diagnosis not present

## 2015-09-20 DIAGNOSIS — R1310 Dysphagia, unspecified: Secondary | ICD-10-CM | POA: Diagnosis not present

## 2015-09-20 DIAGNOSIS — Z7984 Long term (current) use of oral hypoglycemic drugs: Secondary | ICD-10-CM | POA: Insufficient documentation

## 2015-09-20 DIAGNOSIS — K224 Dyskinesia of esophagus: Secondary | ICD-10-CM

## 2015-09-20 DIAGNOSIS — Q394 Esophageal web: Secondary | ICD-10-CM | POA: Diagnosis not present

## 2015-09-20 DIAGNOSIS — E119 Type 2 diabetes mellitus without complications: Secondary | ICD-10-CM | POA: Diagnosis not present

## 2015-09-20 HISTORY — PX: ESOPHAGOGASTRODUODENOSCOPY: SHX5428

## 2015-09-20 HISTORY — PX: ESOPHAGEAL DILATION: SHX303

## 2015-09-20 LAB — GLUCOSE, CAPILLARY: Glucose-Capillary: 104 mg/dL — ABNORMAL HIGH (ref 65–99)

## 2015-09-20 SURGERY — EGD (ESOPHAGOGASTRODUODENOSCOPY)
Anesthesia: Moderate Sedation

## 2015-09-20 MED ORDER — ONDANSETRON HCL 4 MG/2ML IJ SOLN
INTRAMUSCULAR | Status: AC
  Filled 2015-09-20: qty 2

## 2015-09-20 MED ORDER — LIDOCAINE VISCOUS 2 % MT SOLN
OROMUCOSAL | Status: DC | PRN
  Administered 2015-09-20: 4 mL via OROMUCOSAL

## 2015-09-20 MED ORDER — LIDOCAINE VISCOUS 2 % MT SOLN
OROMUCOSAL | Status: AC
  Filled 2015-09-20: qty 15

## 2015-09-20 MED ORDER — STERILE WATER FOR IRRIGATION IR SOLN
Status: DC | PRN
  Administered 2015-09-20: 09:00:00

## 2015-09-20 MED ORDER — MIDAZOLAM HCL 5 MG/5ML IJ SOLN
INTRAMUSCULAR | Status: DC | PRN
  Administered 2015-09-20 (×2): 1 mg via INTRAVENOUS
  Administered 2015-09-20: 2 mg via INTRAVENOUS
  Administered 2015-09-20: 1 mg via INTRAVENOUS

## 2015-09-20 MED ORDER — MIDAZOLAM HCL 5 MG/5ML IJ SOLN
INTRAMUSCULAR | Status: AC
  Filled 2015-09-20: qty 10

## 2015-09-20 MED ORDER — ONDANSETRON HCL 4 MG/2ML IJ SOLN
INTRAMUSCULAR | Status: DC | PRN
  Administered 2015-09-20: 4 mg via INTRAVENOUS

## 2015-09-20 MED ORDER — MEPERIDINE HCL 100 MG/ML IJ SOLN
INTRAMUSCULAR | Status: AC
  Filled 2015-09-20: qty 2

## 2015-09-20 MED ORDER — SODIUM CHLORIDE 0.9 % IV SOLN
INTRAVENOUS | Status: DC
  Administered 2015-09-20: 08:00:00 via INTRAVENOUS

## 2015-09-20 MED ORDER — MEPERIDINE HCL 100 MG/ML IJ SOLN
INTRAMUSCULAR | Status: DC | PRN
  Administered 2015-09-20: 50 mg via INTRAVENOUS
  Administered 2015-09-20: 25 mg via INTRAVENOUS

## 2015-09-20 NOTE — Discharge Instructions (Signed)
EGD Discharge instructions Please read the instructions outlined below and refer to this sheet in the next few weeks. These discharge instructions provide you with general information on caring for yourself after you leave the hospital. Your doctor may also give you specific instructions. While your treatment has been planned according to the most current medical practices available, unavoidable complications occasionally occur. If you have any problems or questions after discharge, please call your doctor. ACTIVITY  You may resume your regular activity but move at a slower pace for the next 24 hours.   Take frequent rest periods for the next 24 hours.   Walking will help expel (get rid of) the air and reduce the bloated feeling in your abdomen.   No driving for 24 hours (because of the anesthesia (medicine) used during the test).   You may shower.   Do not sign any important legal documents or operate any machinery for 24 hours (because of the anesthesia used during the test).  NUTRITION  Drink plenty of fluids.   You may resume your normal diet.   Begin with a light meal and progress to your normal diet.   Avoid alcoholic beverages for 24 hours or as instructed by your caregiver.  MEDICATIONS  You may resume your normal medications unless your caregiver tells you otherwise.  WHAT YOU CAN EXPECT TODAY  You may experience abdominal discomfort such as a feeling of fullness or gas pains.  FOLLOW-UP  Your doctor will discuss the results of your test with you.  SEEK IMMEDIATE MEDICAL ATTENTION IF ANY OF THE FOLLOWING OCCUR:  Excessive nausea (feeling sick to your stomach) and/or vomiting.   Severe abdominal pain and distention (swelling).   Trouble swallowing.   Temperature over 101 F (37.8 C).   Rectal bleeding or vomiting of blood.    GERD and hiatal hernia information provided  Increase Nexium to 40 mg twice daily  Office visit with [place] in 3  months     Gastroesophageal Reflux Disease, Adult Normally, food travels down the esophagus and stays in the stomach to be digested. However, when a person has gastroesophageal reflux disease (GERD), food and stomach acid move back up into the esophagus. When this happens, the esophagus becomes sore and inflamed. Over time, GERD can create small holes (ulcers) in the lining of the esophagus.  CAUSES This condition is caused by a problem with the muscle between the esophagus and the stomach (lower esophageal sphincter, or LES). Normally, the LES muscle closes after food passes through the esophagus to the stomach. When the LES is weakened or abnormal, it does not close properly, and that allows food and stomach acid to go back up into the esophagus. The LES can be weakened by certain dietary substances, medicines, and medical conditions, including:  Tobacco use.  Pregnancy.  Having a hiatal hernia.  Heavy alcohol use.  Certain foods and beverages, such as coffee, chocolate, onions, and peppermint. RISK FACTORS This condition is more likely to develop in:  People who have an increased body weight.  People who have connective tissue disorders.  People who use NSAID medicines. SYMPTOMS Symptoms of this condition include:  Heartburn.  Difficult or painful swallowing.  The feeling of having a lump in the throat.  Abitter taste in the mouth.  Bad breath.  Having a large amount of saliva.  Having an upset or bloated stomach.  Belching.  Chest pain.  Shortness of breath or wheezing.  Ongoing (chronic) cough or a night-time cough.  Wearing away of tooth enamel.  Weight loss. Different conditions can cause chest pain. Make sure to see your health care provider if you experience chest pain. DIAGNOSIS Your health care provider will take a medical history and perform a physical exam. To determine if you have mild or severe GERD, your health care provider may also monitor  how you respond to treatment. You may also have other tests, including:  An endoscopy toexamine your stomach and esophagus with a small camera.  A test thatmeasures the acidity level in your esophagus.  A test thatmeasures how much pressure is on your esophagus.  A barium swallow or modified barium swallow to show the shape, size, and functioning of your esophagus. TREATMENT The goal of treatment is to help relieve your symptoms and to prevent complications. Treatment for this condition may vary depending on how severe your symptoms are. Your health care provider may recommend:  Changes to your diet.  Medicine.  Surgery. HOME CARE INSTRUCTIONS Diet  Follow a diet as recommended by your health care provider. This may involve avoiding foods and drinks such as:  Coffee and tea (with or without caffeine).  Drinks that containalcohol.  Energy drinks and sports drinks.  Carbonated drinks or sodas.  Chocolate and cocoa.  Peppermint and mint flavorings.  Garlic and onions.  Horseradish.  Spicy and acidic foods, including peppers, chili powder, curry powder, vinegar, hot sauces, and barbecue sauce.  Citrus fruit juices and citrus fruits, such as oranges, lemons, and limes.  Tomato-based foods, such as red sauce, chili, salsa, and pizza with red sauce.  Fried and fatty foods, such as donuts, french fries, potato chips, and high-fat dressings.  High-fat meats, such as hot dogs and fatty cuts of red and white meats, such as rib eye steak, sausage, ham, and bacon.  High-fat dairy items, such as whole milk, butter, and cream cheese.  Eat small, frequent meals instead of large meals.  Avoid drinking large amounts of liquid with your meals.  Avoid eating meals during the 2-3 hours before bedtime.  Avoid lying down right after you eat.  Do not exercise right after you eat. General Instructions  Pay attention to any changes in your symptoms.  Take over-the-counter  and prescription medicines only as told by your health care provider. Do not take aspirin, ibuprofen, or other NSAIDs unless your health care provider told you to do so.  Do not use any tobacco products, including cigarettes, chewing tobacco, and e-cigarettes. If you need help quitting, ask your health care provider.  Wear loose-fitting clothing. Do not wear anything tight around your waist that causes pressure on your abdomen.  Raise (elevate) the head of your bed 6 inches (15cm).  Try to reduce your stress, such as with yoga or meditation. If you need help reducing stress, ask your health care provider.  If you are overweight, reduce your weight to an amount that is healthy for you. Ask your health care provider for guidance about a safe weight loss goal.  Keep all follow-up visits as told by your health care provider. This is important. SEEK MEDICAL CARE IF:  You have new symptoms.  You have unexplained weight loss.  You have difficulty swallowing, or it hurts to swallow.  You have wheezing or a persistent cough.  Your symptoms do not improve with treatment.  You have a hoarse voice. SEEK IMMEDIATE MEDICAL CARE IF:  You have pain in your arms, neck, jaw, teeth, or back.  You feel sweaty, dizzy, or  light-headed.  You have chest pain or shortness of breath.  You vomit and your vomit looks like blood or coffee grounds.  You faint.  Your stool is bloody or black.  You cannot swallow, drink, or eat.   This information is not intended to replace advice given to you by your health care provider. Make sure you discuss any questions you have with your health care provider.   Document Released: 06/25/2005 Document Revised: 06/06/2015 Document Reviewed: 01/10/2015 Elsevier Interactive Patient Education 2016 [employer]    Hiatal Hernia A hiatal hernia occurs when part of your stomach slides above the muscle that separates your abdomen from your chest (diaphragm). You can  be born with a hiatal hernia (congenital), or it may develop over time. In almost all cases of hiatal hernia, only the top part of the stomach pushes through.  Many people have a hiatal hernia with no symptoms. The larger the hernia, the more likely that you will have symptoms. In some cases, a hiatal hernia allows stomach acid to flow back into the tube that carries food from your mouth to your stomach (esophagus). This may cause heartburn symptoms. Severe heartburn symptoms may mean you have developed a condition called gastroesophageal reflux disease (GERD).  CAUSES  Hiatal hernias are caused by a weakness in the opening (hiatus) where your esophagus passes through your diaphragm to attach to the upper part of your stomach. You may be born with a weakness in your hiatus, or a weakness can develop. RISK FACTORS Older age is a major risk factor for a hiatal hernia. Anything that increases pressure on your diaphragm can also increase your risk of a hiatal hernia. This includes:  Pregnancy.  Excess weight.  Frequent constipation. SIGNS AND SYMPTOMS  People with a hiatal hernia often have no symptoms. If symptoms develop, they are almost always caused by GERD. They may include:  Heartburn.  Belching.  Indigestion.  Trouble swallowing.  Coughing or wheezing.  Sore throat.  Hoarseness.  Chest pain. DIAGNOSIS  A hiatal hernia is sometimes found during an exam for another problem. Your health care provider may suspect a hiatal hernia if you have symptoms of GERD. Tests may be done to diagnose GERD. These may include:  X-rays of your stomach or chest.  An upper gastrointestinal (GI) series. This is an X-ray exam of your GI tract involving the use of a chalky liquid that you swallow. The liquid shows up clearly on the X-ray.  Endoscopy. This is a procedure to look into your stomach using a thin, flexible tube that has a tiny camera and light on the end of it. TREATMENT  If you have no  symptoms, you may not need treatment. If you have symptoms, treatment may include:  Dietary and lifestyle changes to help reduce GERD symptoms.  Medicines. These may include:  Over-the-counter antacids.  Medicines that make your stomach empty more quickly.  Medicines that block the production of stomach acid (H2 blockers).  Stronger medicines to reduce stomach acid (proton pump inhibitors).  You may need surgery to repair the hernia if other treatments are not helping. HOME CARE INSTRUCTIONS   Take all medicines as directed by your health care provider.  Quit smoking, if you smoke.  Try to achieve and maintain a healthy body weight.  Eat frequent small meals instead of three large meals a day. This keeps your stomach from getting too full.  Eat slowly.  Do not lie down right after eating.  Do noteat 1-2  hours before bed.   Do not drink beverages with caffeine. These include cola, coffee, cocoa, and tea.  Do not drink alcohol.  Avoid foods that can make symptoms of GERD worse. These may include:  Fatty foods.  Citrus fruits.  Other foods and drinks that contain acid.  Avoid putting pressure on your belly. Anything that puts pressure on your belly increases the amount of acid that may be pushed up into your esophagus.   Avoid bending over, especially after eating.  Raise the head of your bed by putting blocks under the legs. This keeps your head and esophagus higher than your stomach.  Do not wear tight clothing around your chest or stomach.  Try not to strain when having a bowel movement, when urinating, or when lifting heavy objects. SEEK MEDICAL CARE IF:  Your symptoms are not controlled with medicines or lifestyle changes.  You are having trouble swallowing.  You have coughing or wheezing that will not go away. SEEK IMMEDIATE MEDICAL CARE IF:  Your pain is getting worse.  Your pain spreads to your arms, neck, jaw, teeth, or back.  You have  shortness of breath.  You sweat for no reason.  You feel sick to your stomach (nauseous) or vomit.  You vomit blood.  You have bright red blood in your stools.  You have black, tarry stools.    This information is not intended to replace advice given to you by your health care provider. Make sure you discuss any questions you have with your health care provider.   Document Released: 12/06/2003 Document Revised: 10/06/2014 Document Reviewed: 09/02/2013 Elsevier Interactive Patient Education [employer].

## 2015-09-20 NOTE — H&P (View-Only) (Signed)
Primary Care Physician:  Gar Ponto, MD Primary Gastroenterologist:  Dr. Gala Romney  Pre-Procedure History & Physical: HPI:  [name] is a 64 y.o. female here for follow-up of ulcerative: colitis GERD and Schatzki's ring. Esophageal dilation helped tremendously. More recently she's felt some vague insidious recurrent esophageal dysphagia. GERD well-controlled on Nexium. She has significant symptoms if she misses a dose. She has hiccups recurrent esophageal dysphagia these days. Weight is stable. She says about 6 days out of the she has intermittent abdominal cramps and diarrhea most of the time she is asymptomatic. Denies nonsteroidals.   Past Medical History  Diagnosis Date  . Depression   . Ulcerative colitis 2007  . GERD (gastroesophageal reflux disease)   . Pancreatitis   . COPD (chronic obstructive pulmonary disease) ([place])   . Diverticulitis   . Schatzki's ring   . DDD (degenerative disc disease)   . Chronic back pain   . Arthritis   . PVC's (premature ventricular contractions)   . Hiatal hernia   . Wears dentures     full top-partial bottom  . HOH (hard of hearing)     right  . Degenerative tear of posterior horn of lateral meniscus of right knee 01/27/2014  . Sleep apnea     CPAP at night  . Diabetes mellitus without complication ([place])   . PONV (postoperative nausea and vomiting)     only happened after knee arthroscopy    Past Surgical History  Procedure Laterality Date  . Tonsillectomy    . Dilation and curettage of uterus    . Hernia repair    . Cholecystectomy    . Cervical cone biopsy    . Arch and foot    . Esophagogastroduodenoscopy  01/28/2011    [name]- Schatzki ring, s/p 36F, otherwise normal/Hiatal hernia otherwise normal stomach D1 and D2  . Colonoscopy  12/03/05    [name]-marked inflammatory changes of the rectum, left colon, pan colitis, internal hemorrhoids, sigmoid diverticula  . Esophagogastroduodenoscopy  11/30/08    [name]-Schatzki's ring  status post dilation, hiatal hernia  . Esophagogastroduodenoscopy  07/12/2007    Dilation with 56 French, Schatzki's ring  . Esophagogastroduodenoscopy (egd) with esophageal dilation  09/01/2012    Dr. Gala Romney- schatzki's ring, hiatal hernia  . Colonoscopy N/A 10/20/2013    Dr. Jeramiah Bean:Colonic diverticulosis. colonic mucosal ulcerations involving segment of sigmoid colon, atypical for UC. sigmoid colon path with chronic mildly active colitis consistent with IBD, other biopsies including ascending, transverse, descending colon and rectum unremarkable.   . Foot arthrodesis  [id number]    both feet  . Knee arthroscopy with lateral menisectomy Right 01/27/2014    Procedure: RIGHT KNEE ARTHROSCOPY WITH PARTIAL LATERAL MENISECTOMY, ;  Surgeon: Johnny Bridge, MD;  Location: [facility];  Service: Orthopedics;  Laterality: Right;  . Esophagogastroduodenoscopy N/A 08/10/2014    Dr.Grady Bean- prominent schatzki's ring s/p maloney dilation and disruption, moderate sized hiatal hernia  . Savory dilation N/A 08/10/2014    Procedure: SAVORY DILATION;  Surgeon: Daneil Dolin, MD;  [facility];  Service: Endoscopy;  Laterality: N/A;  [name] dilation N/A 08/10/2014    Procedure: [name] DILATION;  Surgeon: Daneil Dolin, MD;  [facility];  Service: Endoscopy;  Laterality: N/A;    Prior to Admission medications   Medication Sig Start Date End Date Taking? Authorizing Provider  albuterol (PROVENTIL) (2.5 MG/3ML) 0.083% nebulizer solution Take 2.5 mg by nebulization every 6 (six) hours as needed for wheezing  or shortness of breath.   Yes Historical Provider, MD  albuterol (PROVENTIL,VENTOLIN) 90 MCG/ACT inhaler Inhale 2 puffs into the lungs every 4 (four) hours as needed.     Yes Historical Provider, MD  aspirin 81 MG tablet Take 81 mg by mouth daily.     Yes Historical Provider, MD  azelastine (ASTELIN) 137 MCG/SPRAY nasal spray 2 sprays by Nasal route 2 (two) times daily. Use in  each nostril as directed    Yes Historical Provider, MD  azithromycin (ZITHROMAX) 250 MG tablet  09/07/15  Yes Historical Provider, MD  balsalazide (COLAZAL) 750 MG capsule Take 3 capsules (2,250 mg total) by mouth 2 (two) times daily. 03/06/15  Yes Carlis Stable, NP  benzonatate (TESSALON) 100 MG capsule  09/07/15  Yes Historical Provider, MD  buPROPion (WELLBUTRIN SR) 150 MG 12 hr tablet Take 150 mg by mouth daily.   Yes Historical Provider, MD  busPIRone (BUSPAR) 15 MG tablet Take 7.5 mg by mouth 2 (two) times daily. 1/2 tablet bid   Yes Historical Provider, MD  Calcium Carb-Cholecalciferol (CALCIUM-VITAMIN D) 600-400 MG-UNIT TABS Take 1 tablet by mouth 2 (two) times daily.   Yes Historical Provider, MD  cetirizine (ZYRTEC) 10 MG chewable tablet Chew 10 mg by mouth daily.     Yes Historical Provider, MD  DULoxetine (CYMBALTA) 60 MG capsule Take 60 mg by mouth daily.     Yes Historical Provider, MD  esomeprazole (NEXIUM) 40 MG capsule Take 1 capsule (40 mg total) by mouth daily before breakfast. 03/06/15  Yes Carlis Stable, NP  famotidine-calcium carbonate-magnesium hydroxide (PEPCID COMPLETE) 10-800-165 MG CHEW chewable tablet Chew 1 tablet by mouth daily as needed.   Yes Historical Provider, MD  ferrous gluconate (FERGON) 225 (27 FE) MG tablet Take 240 mg by mouth daily.    Yes Historical Provider, MD  Fluticasone-Salmeterol (ADVAIR DISKUS) 100-50 MCG/DOSE AEPB Inhale 1 puff into the lungs every 12 (twelve) hours.     Yes Historical Provider, MD  ketotifen (ALAWAY) 0.025 % ophthalmic solution Place 2 drops into both eyes 2 (two) times daily.   Yes Historical Provider, MD  metFORMIN (GLUCOPHAGE-XR) 500 MG 24 hr tablet Take 1,000 mg by mouth every evening.    Yes Historical Provider, MD  methylcellulose (CITRUCEL) oral powder Take 1 packet by mouth 2 (two) times daily.   Yes Historical Provider, MD  pravastatin (PRAVACHOL) 20 MG tablet Take 1 tablet (20 mg total) by mouth every evening. 10/23/14  Yes Arnoldo Lenis, MD  pregabalin (LYRICA) 75 MG capsule Take 75 mg by mouth 2 (two) times daily.   Yes Historical Provider, MD  tiZANidine (ZANAFLEX) 4 MG tablet TAKE 1 TABLET BY MOUTH EVERY SIX TO EIGHT HOURS AS NEEDED FOR MUSCLE SPASMS 08/20/15  Yes Historical Provider, MD  traMADol (ULTRAM-ER) 100 MG 24 hr tablet Take 100 mg by mouth at bedtime.   Yes Historical Provider, MD  traZODone (DESYREL) 100 MG tablet Take 100 mg by mouth at bedtime.     Yes Historical Provider, MD    Allergies as of 09/11/2015 - Review Complete 09/11/2015  Allergen Reaction Noted  . Aspirin Other (See Comments)   . Cefprozil Other (See Comments)   . Cefuroxime axetil Other (See Comments) 08/08/2008  . Clarithromycin Nausea And Vomiting 08/08/2008  . Codeine Other (See Comments)   . Entex Other (See Comments) 08/08/2008  . Mesalamine Other (See Comments)   . Morphine and related Hives 08/23/2012  . Penicillins Hives 08/08/2008  Family History  Problem Relation Age of Onset  . Colon cancer Neg Hx     Social History   Social History  . Marital Status: Married    Spouse Name: N/A  . Number of Children: 3  . Years of Education: N/A   Occupational History  . 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 Smoking status: Former Smoker -- 1.00 packs/day for 30 years    Types: Cigarettes    Start date: 07/07/1969    Quit date: 08/29/2013  . Smokeless tobacco: Never Used  . Alcohol Use: No  . Drug Use: No  . Sexual Activity:    Partners: Male    Birth Control/ Protection: None     Comment: spouse   Other Topics Concern  . Not on file   Social History Narrative    Review of Systems: See HPI, otherwise negative ROS  Physical Exam: BP 126/75 mmHg  Pulse 88  Temp(Src) 98.3 F (36.8 C) (Oral)  Ht 5' 4.5" (1.638 m)  Wt 188 lb 12.8 oz (85.639 kg)  BMI 31.92 kg/m2 General:   Alert,  Well-developed, well-nourished, pleasant and cooperative in NAD Skin:  Intact without significant  lesions or rashes. Neck:  Supple; no masses or thyromegaly. No significant cervical adenopathy. Lungs:  Clear throughout to auscultation.   No wheezes, crackles, or rhonchi. No acute distress. Heart:  Regular rate and rhythm; no murmurs, clicks, rubs,  or gallops. Abdomen: obese.Non-distended, normal bowel sounds.  Soft and nontender without appreciable mass or hepatosplenomegaly.  Pulses:  Normal pulses noted. Extremities:  Without clubbing or edema.  Impression:  Ulcerative colitis felt to be in remission. She does have 5 or 6 days a month where she has abdominal cramps and nonbloody diarrhea at other times she is set up through asymptomatic from a standpoint of her bowel function. I suspect she has an element of irritable bowel diarrhea predominant  History of GERD intermittent recurrent dysphagia in the setting of a Schatzki's ring. Also ongoing hiccups. Multiple esophageal dilations previously   Recommendations:    Prescription for Levsin 0.125 mg tablets (disp 60) one under the tongue before meals and at bedtime as needed for abdominal cramps and diarrhea - 11 refills  Continue Colazal - no change in dose  BPE / upper GI series to further evaluate dysphagia and hiccups  OK to take a probiotic or a serving of yogurt daily  Avoid nonsteroidal agents.  Provide information on irritable bowel syndrome-diarrhea predominant  Further recommendations to follow.   Notice: This dictation was prepared with Dragon dictation along with smaller phrase technology. Any transcriptional errors that result from this process are unintentional and may not be corrected upon review.

## 2015-09-20 NOTE — Interval H&P Note (Signed)
History and Physical Interval Note:  09/20/2015 8:38 AM  [name]  has presented today for surgery, with the diagnosis of dysphagia, dysmotiltity of esophagus  The various methods of treatment have been discussed with the patient and family. After consideration of risks, benefits and other options for treatment, the patient has consented to  Procedure(s) with comments: ESOPHAGOGASTRODUODENOSCOPY (EGD) (N/A) - 0830 ESOPHAGEAL DILATION (N/A) as a surgical intervention .  The patient's history has been reviewed, patient examined, no change in status, stable for surgery.  I have reviewed the patient's chart and labs.  Questions were answered to the patient's satisfaction.     Glenn Christo  Obstruction of passage of the barium pill at the GE junction. EGD with esophageal dilation as feasible/appropriate today. The risks, benefits, limitations, alternatives and imponderables have been reviewed with the patient. Potential for esophageal dilation, biopsy, etc. have also been reviewed.  Questions have been answered. All parties agreeable.

## 2015-09-20 NOTE — Op Note (Signed)
[facility] [address] [place], [address]   ENDOSCOPY PROCEDURE REPORT  PATIENT: Paula, Bean  MR#: [id number] BIRTHDATE: [birth year] , 23  yrs. old GENDER: female ENDOSCOPIST: R.  Garfield Cornea, MD FACP FACG REFERRED BY:  Gar Ponto, M.D. PROCEDURE DATE:  2015-10-16 PROCEDURE:  EGD, diagnostic and Maloney dilation of esophagus INDICATIONS:  Recurrent esophageal dysphagia; abnormal barium pill esophagram. MEDICATIONS: Versed 5 mg IV and Demerol 75 mg IV in divided doses. Xylocaine gel orally.  Zofran 4 mg IV. ASA CLASS:      Class II  CONSENT: The risks, benefits, limitations, alternatives and imponderables have been discussed.  The potential for biopsy, esophogeal dilation, etc. have also been reviewed.  Questions have been answered.  All parties agreeable.  Please see the history and physical in the medical record for more information.  DESCRIPTION OF PROCEDURE: After the risks benefits and alternatives of the procedure were thoroughly explained, informed consent was obtained.  The EG-[id number] ([id number]) endoscope was introduced through the mouth and advanced to the second portion of the duodenum , limited by Without limitations. The instrument was slowly withdrawn as the mucosa was fully examined. Estimated blood loss is zero unless otherwise noted in this procedure report.    Somewhat dilated esophageal body.  A prominent Schatzki's ring present.  No tumor.  No Barrett's esophagus.  No esophagitis.  The GE junction easily traversed with the scope stomach empty.  Patient is a 45 cm hiatal hernia.  Normal gastric mucosa.  Patent pylorus. Normal-appearing first and second portion of the duodenum.  The scope was withdrawn and a 21 [place] Maloney dilator was passed three quarters insertion easily.  A look back revealed partial dilation of the ring Subsequently, a 58 French Maloney dilator was passed to three quarters insertion.  A look back after  this maneuver revealed the ring to be unchanged without apparent complication.  Finally, a 62 [place] Maloney dilator was passed to three quarters insertion easily.  A look back revealed the ring had been ruptured with minimal bleeding and without apparent complication.  Retroflexed views revealed a hiatal hernia.     The scope was then withdrawn from the patient and the procedure completed.  COMPLICATIONS: There were no immediate complications. EBL 5 mL ENDOSCOPIC IMPRESSION: Schatzki's ring?"status post dilation as described above. Rather large hiatal hernia; Somewhat baggy atonic esophageal body; otherwise normal EGD  RECOMMENDATIONS: Increase Nexium to 40 mg twice daily. Office visit with [place] in 3 months.  REPEAT EXAM:  eSigned:  R. Garfield Cornea, MD Rosalita Chessman [facility] 10/16/2015 9:14 AM    CC:  CPT CODES: ICD CODES:  The ICD and CPT codes recommended by this software are interpretations from the data that the clinical staff has captured with the software.  The verification of the translation of this report to the ICD and CPT codes and modifiers is the sole responsibility of the health care institution and practicing physician where this report was generated.  [employer] [place]. will not be held responsible for the validity of the ICD and CPT codes included on this report.  AMA assumes no liability for data contained or not contained herein. CPT is a Designer, television/film set of the [employer].  PATIENT NAME:  Paula, Bean MR#: [id number]

## 2015-09-26 DIAGNOSIS — J0120 Acute ethmoidal sinusitis, unspecified: Secondary | ICD-10-CM | POA: Diagnosis not present

## 2015-09-26 DIAGNOSIS — R05 Cough: Secondary | ICD-10-CM | POA: Diagnosis not present

## 2015-09-27 ENCOUNTER — Encounter (HOSPITAL_COMMUNITY): Payer: Self-pay | Admitting: Internal Medicine

## 2015-10-02 ENCOUNTER — Other Ambulatory Visit: Payer: Self-pay | Admitting: Cardiology

## 2015-10-11 DIAGNOSIS — R05 Cough: Secondary | ICD-10-CM | POA: Diagnosis not present

## 2015-10-11 DIAGNOSIS — J209 Acute bronchitis, unspecified: Secondary | ICD-10-CM | POA: Diagnosis not present

## 2015-10-22 DIAGNOSIS — M25561 Pain in right knee: Secondary | ICD-10-CM | POA: Diagnosis not present

## 2015-10-22 DIAGNOSIS — M1711 Unilateral primary osteoarthritis, right knee: Secondary | ICD-10-CM | POA: Diagnosis not present

## 2015-10-24 DIAGNOSIS — E7800 Pure hypercholesterolemia, unspecified: Secondary | ICD-10-CM | POA: Diagnosis not present

## 2015-10-24 DIAGNOSIS — J00 Acute nasopharyngitis [common cold]: Secondary | ICD-10-CM | POA: Diagnosis not present

## 2015-10-24 DIAGNOSIS — M1991 Primary osteoarthritis, unspecified site: Secondary | ICD-10-CM | POA: Diagnosis not present

## 2015-10-24 DIAGNOSIS — N189 Chronic kidney disease, unspecified: Secondary | ICD-10-CM | POA: Diagnosis not present

## 2015-10-24 DIAGNOSIS — J029 Acute pharyngitis, unspecified: Secondary | ICD-10-CM | POA: Diagnosis not present

## 2015-10-24 DIAGNOSIS — E782 Mixed hyperlipidemia: Secondary | ICD-10-CM | POA: Diagnosis not present

## 2015-10-24 DIAGNOSIS — E1165 Type 2 diabetes mellitus with hyperglycemia: Secondary | ICD-10-CM | POA: Diagnosis not present

## 2015-10-31 DIAGNOSIS — E782 Mixed hyperlipidemia: Secondary | ICD-10-CM | POA: Diagnosis not present

## 2015-10-31 DIAGNOSIS — N189 Chronic kidney disease, unspecified: Secondary | ICD-10-CM | POA: Diagnosis not present

## 2015-10-31 DIAGNOSIS — K219 Gastro-esophageal reflux disease without esophagitis: Secondary | ICD-10-CM | POA: Diagnosis not present

## 2015-10-31 DIAGNOSIS — G4700 Insomnia, unspecified: Secondary | ICD-10-CM | POA: Diagnosis not present

## 2015-10-31 DIAGNOSIS — J4520 Mild intermittent asthma, uncomplicated: Secondary | ICD-10-CM | POA: Diagnosis not present

## 2015-10-31 DIAGNOSIS — E119 Type 2 diabetes mellitus without complications: Secondary | ICD-10-CM | POA: Diagnosis not present

## 2015-10-31 DIAGNOSIS — M1990 Unspecified osteoarthritis, unspecified site: Secondary | ICD-10-CM | POA: Diagnosis not present

## 2015-10-31 DIAGNOSIS — J309 Allergic rhinitis, unspecified: Secondary | ICD-10-CM | POA: Diagnosis not present

## 2015-10-31 DIAGNOSIS — K5190 Ulcerative colitis, unspecified, without complications: Secondary | ICD-10-CM | POA: Diagnosis not present

## 2015-10-31 DIAGNOSIS — F329 Major depressive disorder, single episode, unspecified: Secondary | ICD-10-CM | POA: Diagnosis not present

## 2015-10-31 DIAGNOSIS — G4733 Obstructive sleep apnea (adult) (pediatric): Secondary | ICD-10-CM | POA: Diagnosis not present

## 2015-11-05 DIAGNOSIS — M25561 Pain in right knee: Secondary | ICD-10-CM | POA: Diagnosis not present

## 2015-11-05 DIAGNOSIS — M1711 Unilateral primary osteoarthritis, right knee: Secondary | ICD-10-CM | POA: Diagnosis not present

## 2015-11-12 ENCOUNTER — Other Ambulatory Visit: Payer: Self-pay | Admitting: Orthopedic Surgery

## 2015-11-29 ENCOUNTER — Encounter (HOSPITAL_COMMUNITY): Payer: Self-pay

## 2015-11-29 ENCOUNTER — Encounter (HOSPITAL_COMMUNITY)
Admission: RE | Admit: 2015-11-29 | Discharge: 2015-11-29 | Disposition: A | Discharge status: Home / Self Care | Payer: Medicare Other | Source: Ambulatory Visit | Attending: Orthopedic Surgery | Admitting: Orthopedic Surgery

## 2015-11-29 DIAGNOSIS — M1711 Unilateral primary osteoarthritis, right knee: Secondary | ICD-10-CM | POA: Insufficient documentation

## 2015-11-29 DIAGNOSIS — Z01812 Encounter for preprocedural laboratory examination: Secondary | ICD-10-CM | POA: Insufficient documentation

## 2015-11-29 HISTORY — DX: Anemia, unspecified: D64.9

## 2015-11-29 HISTORY — DX: Headache, unspecified: R51.9

## 2015-11-29 HISTORY — DX: Fibromyalgia: M79.7

## 2015-11-29 HISTORY — DX: Unspecified asthma, uncomplicated: J45.909

## 2015-11-29 HISTORY — DX: Pneumonia, unspecified organism: J18.9

## 2015-11-29 HISTORY — DX: Angina pectoris, unspecified: I20.9

## 2015-11-29 HISTORY — DX: Headache: R51

## 2015-11-29 HISTORY — DX: Malignant (primary) neoplasm, unspecified: C80.1

## 2015-11-29 HISTORY — DX: Reserved for inherently not codable concepts without codable children: IMO0001

## 2015-11-29 LAB — BASIC METABOLIC PANEL
ANION GAP: 9 (ref 5–15)
BUN: 9 mg/dL (ref 6–20)
CHLORIDE: 105 mmol/L (ref 101–111)
CO2: 25 mmol/L (ref 22–32)
Calcium: 9.4 mg/dL (ref 8.9–10.3)
Creatinine, Ser: 0.92 mg/dL (ref 0.44–1.00)
Glucose, Bld: 95 mg/dL (ref 65–99)
POTASSIUM: 3.7 mmol/L (ref 3.5–5.1)
SODIUM: 139 mmol/L (ref 135–145)

## 2015-11-29 LAB — GLUCOSE, CAPILLARY: Glucose-Capillary: 132 mg/dL — ABNORMAL HIGH (ref 65–99)

## 2015-11-29 LAB — CBC
HEMATOCRIT: 38.2 % (ref 36.0–46.0)
Hemoglobin: 12.6 g/dL (ref 12.0–15.0)
MCH: 31.1 pg (ref 26.0–34.0)
MCHC: 33 g/dL (ref 30.0–36.0)
MCV: 94.3 fL (ref 78.0–100.0)
Platelets: 240 10*3/uL (ref 150–400)
RBC: 4.05 MIL/uL (ref 3.87–5.11)
RDW: 14.9 % (ref 11.5–15.5)
WBC: 8.4 10*3/uL (ref 4.0–10.5)

## 2015-11-29 LAB — SURGICAL PCR SCREEN
MRSA, PCR: NEGATIVE
STAPHYLOCOCCUS AUREUS: NEGATIVE

## 2015-11-29 NOTE — Pre-Procedure Instructions (Signed)
[name]  11/29/2015      [id number] [place] [facility] [address]Phone: [phone]: [phone] [address] [place]Phone: [phone]: [phone] [address]Phone: [phone]: [phone] [place] [address]Phone: [phone]: [phone]   Your procedure is scheduled on   Tuesday  12/11/15  [facility]  Call this number if you have problems the morning of surgery:  [phone]   Remember:  Do not eat food or drink liquids after midnight.  Take these medicines the morning of surgery with A SIP OF WATER   ADVAIR, ALBUTEROL, BUPROPION (WELLBUTRIN), BUSPIRONE, DULOXETINE(CYMBALTA), NEXIUM, TRAMADOL IF NEEDED FOR PAIN     (STOP ASPIRIN, IBUPROFEN/ ADVIL/ MOTRIN, GOODY POWDERS/ BC'S, VITAMINS, HERBAL MEDICINES) How to Manage Your Diabetes Before Surgery   Why is it important to control my blood sugar before and after surgery?   Improving blood sugar levels before and after surgery helps healing and can limit problems.  A way of improving blood sugar control is eating a healthy diet by:  - Eating less sugar and carbohydrates  - Increasing activity/exercise  - Talk with your doctor about reaching your blood sugar goals  High blood sugars (greater than 180 mg/dL) can raise your risk of infections and slow down your recovery so you will need to focus on controlling your diabetes during the weeks before surgery.  Make sure that the doctor who takes care of your diabetes knows about your planned surgery including the date and location.  How do I manage my blood  sugars before surgery?   Check your blood sugar at least 4 times a day, 2 days before surgery to make sure that they are not too high or low.   Check your blood sugar the morning of your surgery when you wake up and every 2               hours until you get to the Short-Stay unit.  If your blood sugar is less than 70 mg/dL, you will need to treat for low blood sugar by:  Treat a low blood sugar (less than 70 mg/dL) with 1/2 cup of clear juice (cranberry or apple), 4 glucose tablets, OR glucose gel.  Recheck blood sugar in 15 minutes after treatment (to make sure it is greater than 70 mg/dL).  If blood sugar is not greater than 70 mg/dL on re-check, call [phone] further instructions.   Report your blood sugar to the Short-Stay nurse when you get to Short-Stay.  References:  [facility] 2007 "How to Manage your Diabetes Before and After Surgery".  What do I do about my diabetes medications?   Do not take oral diabetes medicines (pills) the morning of surgery.    Do not take other diabetes injectables the day of surgery including Byetta, Victoza, Bydureon, and Trulicity.    If your CBG is greater than 220 mg/dL, you may take 1/2 of your sliding scale (correction) dose of insulin.   For patients with "Insulin Pumps":  Contact  your diabetes doctor for specific instructions before surgery.   Decrease basal insulin rates by 20% at midnight the night before surgery.  Note that if your surgery is planned to be longer than 2 hours, your insulin pump will be removed and intravenous (IV) insulin will be started and managed by the nurses and anesthesiologist.  You will be able to restart your insulin pump once you are awake and able to manage it.  Make sure to bring insulin pump supplies to the hospital with you in case your site needs to be changed.        Do not wear jewelry, make-up or nail polish.  Do not wear lotions, powders, or perfumes.   You may wear deodorant.  Do not shave 48 hours prior to surgery.  Men may shave face and neck.  Do not bring valuables to the hospital.  [facility] is not responsible for any belongings or valuables.  Contacts, dentures or bridgework may not be worn into surgery.  Leave your suitcase in the car.  After surgery it may be brought to your room.  For patients admitted to the hospital, discharge time will be determined by your treatment team.  Patients discharged the day of surgery will not be allowed to drive home.   Name and phone number of your driver:   Special instructions:  SEE PREPARING FOR SURGERY  Please read over the following fact sheets that you were given. Pain Booklet, Coughing and Deep Breathing, MRSA Information and Surgical Site Infection Prevention

## 2015-11-30 LAB — HEMOGLOBIN A1C
Hgb A1c MFr Bld: 5.7 % — ABNORMAL HIGH (ref 4.8–5.6)
Mean Plasma Glucose: 117 mg/dL

## 2015-12-09 ENCOUNTER — Other Ambulatory Visit: Payer: Self-pay | Admitting: Internal Medicine

## 2015-12-11 ENCOUNTER — Inpatient Hospital Stay (HOSPITAL_COMMUNITY): Payer: Medicare Other

## 2015-12-11 ENCOUNTER — Encounter (HOSPITAL_COMMUNITY): Payer: Self-pay | Admitting: Surgery

## 2015-12-11 ENCOUNTER — Inpatient Hospital Stay (HOSPITAL_COMMUNITY): Payer: Medicare Other | Admitting: Anesthesiology

## 2015-12-11 ENCOUNTER — Encounter (HOSPITAL_COMMUNITY)
Admission: RE | Disposition: A | Discharge status: Home Health | Payer: Self-pay | Source: Ambulatory Visit | Attending: Orthopedic Surgery

## 2015-12-11 ENCOUNTER — Inpatient Hospital Stay (HOSPITAL_COMMUNITY)
Admission: RE | Admit: 2015-12-11 | Discharge: 2015-12-14 | DRG: 470 | Disposition: A | Discharge status: Home Health | Payer: Medicare Other | Source: Ambulatory Visit | Attending: Orthopedic Surgery | Admitting: Orthopedic Surgery

## 2015-12-11 DIAGNOSIS — Z7951 Long term (current) use of inhaled steroids: Secondary | ICD-10-CM | POA: Diagnosis not present

## 2015-12-11 DIAGNOSIS — F329 Major depressive disorder, single episode, unspecified: Secondary | ICD-10-CM | POA: Diagnosis present

## 2015-12-11 DIAGNOSIS — Z7982 Long term (current) use of aspirin: Secondary | ICD-10-CM | POA: Diagnosis not present

## 2015-12-11 DIAGNOSIS — Z7984 Long term (current) use of oral hypoglycemic drugs: Secondary | ICD-10-CM | POA: Diagnosis not present

## 2015-12-11 DIAGNOSIS — G4730 Sleep apnea, unspecified: Secondary | ICD-10-CM | POA: Diagnosis present

## 2015-12-11 DIAGNOSIS — M797 Fibromyalgia: Secondary | ICD-10-CM | POA: Diagnosis present

## 2015-12-11 DIAGNOSIS — M1711 Unilateral primary osteoarthritis, right knee: Secondary | ICD-10-CM | POA: Diagnosis present

## 2015-12-11 DIAGNOSIS — K219 Gastro-esophageal reflux disease without esophagitis: Secondary | ICD-10-CM | POA: Diagnosis present

## 2015-12-11 DIAGNOSIS — J449 Chronic obstructive pulmonary disease, unspecified: Secondary | ICD-10-CM | POA: Diagnosis present

## 2015-12-11 DIAGNOSIS — M179 Osteoarthritis of knee, unspecified: Secondary | ICD-10-CM | POA: Diagnosis not present

## 2015-12-11 DIAGNOSIS — Z96651 Presence of right artificial knee joint: Secondary | ICD-10-CM | POA: Diagnosis not present

## 2015-12-11 DIAGNOSIS — E119 Type 2 diabetes mellitus without complications: Secondary | ICD-10-CM | POA: Diagnosis present

## 2015-12-11 DIAGNOSIS — Z471 Aftercare following joint replacement surgery: Secondary | ICD-10-CM | POA: Diagnosis not present

## 2015-12-11 DIAGNOSIS — Z87891 Personal history of nicotine dependence: Secondary | ICD-10-CM

## 2015-12-11 DIAGNOSIS — Z79899 Other long term (current) drug therapy: Secondary | ICD-10-CM

## 2015-12-11 DIAGNOSIS — Z8541 Personal history of malignant neoplasm of cervix uteri: Secondary | ICD-10-CM

## 2015-12-11 DIAGNOSIS — H9190 Unspecified hearing loss, unspecified ear: Secondary | ICD-10-CM | POA: Diagnosis present

## 2015-12-11 DIAGNOSIS — M25561 Pain in right knee: Secondary | ICD-10-CM | POA: Diagnosis not present

## 2015-12-11 HISTORY — DX: Unilateral primary osteoarthritis, right knee: M17.11

## 2015-12-11 HISTORY — PX: TOTAL KNEE ARTHROPLASTY: SHX125

## 2015-12-11 LAB — GLUCOSE, CAPILLARY
GLUCOSE-CAPILLARY: 99 mg/dL (ref 65–99)
GLUCOSE-CAPILLARY: 168 mg/dL — AB (ref 65–99)
Glucose-Capillary: 107 mg/dL — ABNORMAL HIGH (ref 65–99)
Glucose-Capillary: 137 mg/dL — ABNORMAL HIGH (ref 65–99)

## 2015-12-11 SURGERY — ARTHROPLASTY, KNEE, TOTAL
Anesthesia: Spinal | Site: Knee | Laterality: Right

## 2015-12-11 MED ORDER — LACTATED RINGERS IV SOLN
INTRAVENOUS | Status: DC
  Administered 2015-12-11 (×3): via INTRAVENOUS

## 2015-12-11 MED ORDER — FENTANYL CITRATE (PF) 250 MCG/5ML IJ SOLN
INTRAMUSCULAR | Status: DC | PRN
  Administered 2015-12-11 (×5): 25 ug via INTRAVENOUS
  Administered 2015-12-11: 50 ug via INTRAVENOUS
  Administered 2015-12-11 (×3): 25 ug via INTRAVENOUS

## 2015-12-11 MED ORDER — INSULIN ASPART 100 UNIT/ML ~~LOC~~ SOLN
0.0000 [IU] | Freq: Three times a day (TID) | SUBCUTANEOUS | Status: DC
  Administered 2015-12-13: 2 [IU] via SUBCUTANEOUS

## 2015-12-11 MED ORDER — BUPIVACAINE HCL (PF) 0.25 % IJ SOLN
INTRAMUSCULAR | Status: DC | PRN
  Administered 2015-12-11: 30 mL

## 2015-12-11 MED ORDER — MICONAZOLE NITRATE 2 % EX CREA
1.0000 "application " | TOPICAL_CREAM | Freq: Every day | CUTANEOUS | Status: DC | PRN
  Filled 2015-12-11: qty 14

## 2015-12-11 MED ORDER — HYDROMORPHONE HCL 1 MG/ML IJ SOLN
0.2500 mg | INTRAMUSCULAR | Status: DC | PRN
Start: 2015-12-11 — End: 2015-12-14

## 2015-12-11 MED ORDER — ACETAMINOPHEN 325 MG PO TABS: 650.0000 mg | ORAL_TABLET | Freq: Four times a day (QID) | ORAL | Status: DC | PRN

## 2015-12-11 MED ORDER — BUPIVACAINE HCL (PF) 0.75 % IJ SOLN
INTRAMUSCULAR | Status: DC | PRN
  Administered 2015-12-11: 2 mL via INTRATHECAL

## 2015-12-11 MED ORDER — CEFAZOLIN SODIUM-DEXTROSE 2-3 GM-% IV SOLR
2.0000 g | Freq: Four times a day (QID) | INTRAVENOUS | Status: AC
  Administered 2015-12-11 – 2015-12-12 (×2): 2 g via INTRAVENOUS
  Filled 2015-12-11 (×2): qty 50

## 2015-12-11 MED ORDER — PANTOPRAZOLE SODIUM 40 MG PO TBEC
80.0000 mg | DELAYED_RELEASE_TABLET | Freq: Every day | ORAL | Status: DC
  Administered 2015-12-12 – 2015-12-14 (×3): 80 mg via ORAL
  Filled 2015-12-11 (×2): qty 2

## 2015-12-11 MED ORDER — KETAMINE HCL 100 MG/ML IJ SOLN
INTRAMUSCULAR | Status: AC
  Filled 2015-12-11: qty 1

## 2015-12-11 MED ORDER — FENTANYL CITRATE (PF) 100 MCG/2ML IJ SOLN
INTRAMUSCULAR | Status: AC
  Filled 2015-12-11: qty 2

## 2015-12-11 MED ORDER — PSYLLIUM 95 % PO PACK
1.0000 | PACK | Freq: Two times a day (BID) | ORAL | Status: DC
  Administered 2015-12-11 – 2015-12-14 (×5): 1 via ORAL
  Filled 2015-12-11 (×10): qty 1

## 2015-12-11 MED ORDER — TRAMADOL HCL 50 MG PO TABS
50.0000 mg | ORAL_TABLET | Freq: Two times a day (BID) | ORAL | Status: DC
  Administered 2015-12-11 – 2015-12-14 (×6): 50 mg via ORAL
  Filled 2015-12-11 (×6): qty 1

## 2015-12-11 MED ORDER — SODIUM CHLORIDE 0.9 % IR SOLN
Status: DC | PRN
Start: 2015-12-11 — End: 2015-12-11
  Administered 2015-12-11: 3000 mL

## 2015-12-11 MED ORDER — PROPOFOL 10 MG/ML IV BOLUS
INTRAVENOUS | Status: DC | PRN
  Administered 2015-12-11 (×2): 20 mg via INTRAVENOUS

## 2015-12-11 MED ORDER — CALCIUM-VITAMIN D 600-400 MG-UNIT PO TABS: 1.0000 | ORAL_TABLET | Freq: Two times a day (BID) | ORAL | Status: DC

## 2015-12-11 MED ORDER — DULOXETINE HCL 60 MG PO CPEP
90.0000 mg | ORAL_CAPSULE | Freq: Every day | ORAL | Status: DC
  Administered 2015-12-12 – 2015-12-14 (×3): 90 mg via ORAL
  Filled 2015-12-11 (×3): qty 1

## 2015-12-11 MED ORDER — LIDOCAINE HCL (CARDIAC) 20 MG/ML IV SOLN
INTRAVENOUS | Status: AC
  Filled 2015-12-11: qty 5

## 2015-12-11 MED ORDER — ONDANSETRON HCL 4 MG/2ML IJ SOLN: 4.0000 mg | Freq: Once | INTRAMUSCULAR | Status: DC | PRN

## 2015-12-11 MED ORDER — TRAZODONE HCL 100 MG PO TABS
100.0000 mg | ORAL_TABLET | Freq: Every day | ORAL | Status: DC
  Administered 2015-12-11 – 2015-12-13 (×3): 100 mg via ORAL
  Filled 2015-12-11 (×3): qty 1

## 2015-12-11 MED ORDER — TRAMADOL HCL ER 100 MG PO TB24: 100.0000 mg | ORAL_TABLET | Freq: Every day | ORAL | Status: DC

## 2015-12-11 MED ORDER — ONDANSETRON HCL 4 MG/2ML IJ SOLN: 4.0000 mg | Freq: Four times a day (QID) | INTRAMUSCULAR | Status: DC | PRN

## 2015-12-11 MED ORDER — BACLOFEN 10 MG PO TABS: 10.0000 mg | ORAL_TABLET | Freq: Three times a day (TID) | ORAL | Status: DC

## 2015-12-11 MED ORDER — POTASSIUM CHLORIDE IN NACL 20-0.45 MEQ/L-% IV SOLN
INTRAVENOUS | Status: DC
  Administered 2015-12-12: via INTRAVENOUS
  Filled 2015-12-11 (×7): qty 1000

## 2015-12-11 MED ORDER — SENNA 8.6 MG PO TABS
1.0000 | ORAL_TABLET | Freq: Two times a day (BID) | ORAL | Status: DC
  Administered 2015-12-11 – 2015-12-14 (×6): 8.6 mg via ORAL
  Filled 2015-12-11 (×6): qty 1

## 2015-12-11 MED ORDER — GLYCOPYRROLATE 0.2 MG/ML IJ SOLN
INTRAMUSCULAR | Status: AC
  Filled 2015-12-11: qty 1

## 2015-12-11 MED ORDER — DIPHENHYDRAMINE HCL 12.5 MG/5ML PO ELIX: 12.5000 mg | ORAL_SOLUTION | ORAL | Status: DC | PRN

## 2015-12-11 MED ORDER — RIVAROXABAN 10 MG PO TABS
10.0000 mg | ORAL_TABLET | Freq: Every day | ORAL | Status: DC
  Administered 2015-12-12 – 2015-12-14 (×3): 10 mg via ORAL
  Filled 2015-12-11 (×3): qty 1

## 2015-12-11 MED ORDER — OXYCODONE-ACETAMINOPHEN 10-325 MG PO TABS: 1.0000 | ORAL_TABLET | Freq: Four times a day (QID) | ORAL | Status: DC | PRN

## 2015-12-11 MED ORDER — ONDANSETRON HCL 4 MG PO TABS: 4.0000 mg | ORAL_TABLET | Freq: Four times a day (QID) | ORAL | Status: DC | PRN

## 2015-12-11 MED ORDER — KETOROLAC TROMETHAMINE 30 MG/ML IJ SOLN
INTRAMUSCULAR | Status: AC
  Filled 2015-12-11: qty 1

## 2015-12-11 MED ORDER — ACETAMINOPHEN 650 MG RE SUPP: 650.0000 mg | Freq: Four times a day (QID) | RECTAL | Status: DC | PRN

## 2015-12-11 MED ORDER — DOCUSATE SODIUM 100 MG PO CAPS
100.0000 mg | ORAL_CAPSULE | Freq: Two times a day (BID) | ORAL | Status: DC
  Administered 2015-12-11 – 2015-12-14 (×6): 100 mg via ORAL
  Filled 2015-12-11 (×6): qty 1

## 2015-12-11 MED ORDER — KETAMINE HCL 100 MG/ML IJ SOLN
INTRAMUSCULAR | Status: DC | PRN
  Administered 2015-12-11 (×2): 20 mg via INTRAVENOUS
  Administered 2015-12-11: 10 mg via INTRAVENOUS
  Administered 2015-12-11 (×2): 20 mg via INTRAVENOUS
  Administered 2015-12-11: 10 mg via INTRAVENOUS

## 2015-12-11 MED ORDER — BUSPIRONE HCL 5 MG PO TABS
7.5000 mg | ORAL_TABLET | Freq: Two times a day (BID) | ORAL | Status: DC
  Administered 2015-12-11 – 2015-12-14 (×6): 7.5 mg via ORAL
  Filled 2015-12-11 (×6): qty 2

## 2015-12-11 MED ORDER — LORATADINE 10 MG PO TABS
10.0000 mg | ORAL_TABLET | Freq: Every day | ORAL | Status: DC
  Administered 2015-12-11 – 2015-12-14 (×4): 10 mg via ORAL
  Filled 2015-12-11 (×4): qty 1

## 2015-12-11 MED ORDER — 0.9 % SODIUM CHLORIDE (POUR BTL) OPTIME
TOPICAL | Status: DC | PRN
  Administered 2015-12-11: 1000 mL

## 2015-12-11 MED ORDER — FENTANYL CITRATE (PF) 250 MCG/5ML IJ SOLN
INTRAMUSCULAR | Status: AC
  Filled 2015-12-11: qty 5

## 2015-12-11 MED ORDER — MIDAZOLAM HCL 2 MG/2ML IJ SOLN
INTRAMUSCULAR | Status: AC
  Filled 2015-12-11: qty 2

## 2015-12-11 MED ORDER — PHENOL 1.4 % MT LIQD: 1.0000 | OROMUCOSAL | Status: DC | PRN

## 2015-12-11 MED ORDER — METHOCARBAMOL 1000 MG/10ML IJ SOLN
500.0000 mg | INTRAVENOUS | Status: AC
  Administered 2015-12-11: 500 mg via INTRAVENOUS
  Filled 2015-12-11: qty 5

## 2015-12-11 MED ORDER — FLUTICASONE FUROATE-VILANTEROL 200-25 MCG/INH IN AEPB
1.0000 | INHALATION_SPRAY | Freq: Every day | RESPIRATORY_TRACT | Status: DC
  Administered 2015-12-13 – 2015-12-14 (×2): 1 via RESPIRATORY_TRACT
  Filled 2015-12-11 (×2): qty 28

## 2015-12-11 MED ORDER — METHOCARBAMOL 500 MG PO TABS
500.0000 mg | ORAL_TABLET | Freq: Four times a day (QID) | ORAL | Status: DC | PRN
  Administered 2015-12-12 – 2015-12-13 (×4): 500 mg via ORAL
  Filled 2015-12-11 (×4): qty 1

## 2015-12-11 MED ORDER — DEXAMETHASONE SODIUM PHOSPHATE 4 MG/ML IJ SOLN
INTRAMUSCULAR | Status: DC | PRN
  Administered 2015-12-11: 4 mg via INTRAVENOUS

## 2015-12-11 MED ORDER — DEXAMETHASONE SODIUM PHOSPHATE 4 MG/ML IJ SOLN
INTRAMUSCULAR | Status: AC
  Filled 2015-12-11: qty 1

## 2015-12-11 MED ORDER — BUPIVACAINE HCL (PF) 0.25 % IJ SOLN
INTRAMUSCULAR | Status: AC
  Filled 2015-12-11: qty 30

## 2015-12-11 MED ORDER — HYDROMORPHONE HCL 1 MG/ML IJ SOLN: 0.5000 mg | INTRAMUSCULAR | Status: DC | PRN

## 2015-12-11 MED ORDER — KETOROLAC TROMETHAMINE 15 MG/ML IJ SOLN
7.5000 mg | Freq: Four times a day (QID) | INTRAMUSCULAR | Status: AC
  Administered 2015-12-11 – 2015-12-12 (×4): 7.5 mg via INTRAVENOUS
  Filled 2015-12-11 (×4): qty 1

## 2015-12-11 MED ORDER — SENNA-DOCUSATE SODIUM 8.6-50 MG PO TABS: 2.0000 | ORAL_TABLET | Freq: Every day | ORAL | Status: DC

## 2015-12-11 MED ORDER — METOCLOPRAMIDE HCL 5 MG/ML IJ SOLN: 5.0000 mg | Freq: Three times a day (TID) | INTRAMUSCULAR | Status: DC | PRN

## 2015-12-11 MED ORDER — FERROUS GLUCONATE 324 (38 FE) MG PO TABS
324.0000 mg | ORAL_TABLET | Freq: Every day | ORAL | Status: DC
  Administered 2015-12-12 – 2015-12-14 (×3): 324 mg via ORAL
  Filled 2015-12-11 (×6): qty 1

## 2015-12-11 MED ORDER — ONDANSETRON HCL 4 MG/2ML IJ SOLN
INTRAMUSCULAR | Status: AC
  Filled 2015-12-11: qty 2

## 2015-12-11 MED ORDER — ALBUTEROL SULFATE (2.5 MG/3ML) 0.083% IN NEBU: 2.5000 mg | INHALATION_SOLUTION | Freq: Four times a day (QID) | RESPIRATORY_TRACT | Status: DC | PRN

## 2015-12-11 MED ORDER — ONDANSETRON HCL 4 MG PO TABS: 4.0000 mg | ORAL_TABLET | Freq: Three times a day (TID) | ORAL | Status: DC | PRN

## 2015-12-11 MED ORDER — PREGABALIN 100 MG PO CAPS
100.0000 mg | ORAL_CAPSULE | Freq: Two times a day (BID) | ORAL | Status: DC
  Administered 2015-12-11 – 2015-12-14 (×6): 100 mg via ORAL
  Filled 2015-12-11 (×6): qty 1

## 2015-12-11 MED ORDER — FENTANYL CITRATE (PF) 100 MCG/2ML IJ SOLN
INTRAMUSCULAR | Status: AC
  Administered 2015-12-11: 50 ug via INTRAVENOUS
  Filled 2015-12-11: qty 2

## 2015-12-11 MED ORDER — PROPOFOL 500 MG/50ML IV EMUL
INTRAVENOUS | Status: DC | PRN
  Administered 2015-12-11: 25 ug/kg/min via INTRAVENOUS

## 2015-12-11 MED ORDER — RIVAROXABAN 10 MG PO TABS: 10.0000 mg | ORAL_TABLET | Freq: Every day | ORAL | Status: DC

## 2015-12-11 MED ORDER — CALCIUM CARBONATE-VITAMIN D 500-200 MG-UNIT PO TABS
1.0000 | ORAL_TABLET | Freq: Two times a day (BID) | ORAL | Status: DC
  Administered 2015-12-11 – 2015-12-14 (×6): 1 via ORAL
  Filled 2015-12-11 (×6): qty 1

## 2015-12-11 MED ORDER — METOCLOPRAMIDE HCL 5 MG PO TABS: 5.0000 mg | ORAL_TABLET | Freq: Three times a day (TID) | ORAL | Status: DC | PRN

## 2015-12-11 MED ORDER — HYDROMORPHONE HCL 1 MG/ML IJ SOLN
INTRAMUSCULAR | Status: AC
  Filled 2015-12-11: qty 1

## 2015-12-11 MED ORDER — BUPROPION HCL ER (XL) 150 MG PO TB24
150.0000 mg | ORAL_TABLET | Freq: Every day | ORAL | Status: DC
  Administered 2015-12-12 – 2015-12-14 (×3): 150 mg via ORAL
  Filled 2015-12-11 (×3): qty 1

## 2015-12-11 MED ORDER — ALUM & MAG HYDROXIDE-SIMETH 200-200-20 MG/5ML PO SUSP: 30.0000 mL | ORAL | Status: DC | PRN

## 2015-12-11 MED ORDER — DULOXETINE HCL 30 MG PO CPEP: 30.0000 mg | ORAL_CAPSULE | Freq: Every day | ORAL | Status: DC

## 2015-12-11 MED ORDER — ONDANSETRON HCL 4 MG/2ML IJ SOLN
INTRAMUSCULAR | Status: DC | PRN
  Administered 2015-12-11: 4 mg via INTRAVENOUS

## 2015-12-11 MED ORDER — MIDAZOLAM HCL 2 MG/2ML IJ SOLN
INTRAMUSCULAR | Status: DC | PRN
  Administered 2015-12-11 (×2): 1 mg via INTRAVENOUS

## 2015-12-11 MED ORDER — FAMOTIDINE-CA CARB-MAG HYDROX 10-800-165 MG PO CHEW: 1.0000 | CHEWABLE_TABLET | Freq: Every day | ORAL | Status: DC | PRN

## 2015-12-11 MED ORDER — OXYCODONE HCL 5 MG PO TABS
5.0000 mg | ORAL_TABLET | ORAL | Status: DC | PRN
  Administered 2015-12-11 – 2015-12-14 (×9): 10 mg via ORAL
  Filled 2015-12-11 (×10): qty 2

## 2015-12-11 MED ORDER — DEXTROSE 5 % IV SOLN: 2.0000 g | Freq: Once | INTRAVENOUS | Status: DC

## 2015-12-11 MED ORDER — POLYETHYLENE GLYCOL 3350 17 G PO PACK: 17.0000 g | PACK | Freq: Every day | ORAL | Status: DC | PRN

## 2015-12-11 MED ORDER — DEXTROSE 5 % IV SOLN
2.0000 g | Freq: Once | INTRAVENOUS | Status: AC
  Administered 2015-12-11: 1.9 g via INTRAVENOUS
  Administered 2015-12-11: .1 g via INTRAVENOUS
  Filled 2015-12-11: qty 20

## 2015-12-11 MED ORDER — ALBUTEROL 90 MCG/ACT IN AERS: 2.0000 | INHALATION_SPRAY | RESPIRATORY_TRACT | Status: DC | PRN

## 2015-12-11 MED ORDER — KETOTIFEN FUMARATE 0.025 % OP SOLN
2.0000 [drp] | Freq: Two times a day (BID) | OPHTHALMIC | Status: DC
  Administered 2015-12-11 – 2015-12-14 (×4): 2 [drp] via OPHTHALMIC
  Filled 2015-12-11 (×3): qty 5

## 2015-12-11 MED ORDER — DULOXETINE HCL 60 MG PO CPEP: 60.0000 mg | ORAL_CAPSULE | Freq: Every day | ORAL | Status: DC

## 2015-12-11 MED ORDER — KETOROLAC TROMETHAMINE 30 MG/ML IJ SOLN
INTRAMUSCULAR | Status: DC | PRN
  Administered 2015-12-11: 30 mg via INTRA_ARTICULAR

## 2015-12-11 MED ORDER — AZELASTINE HCL 0.1 % NA SOLN
2.0000 | Freq: Two times a day (BID) | NASAL | Status: DC
  Administered 2015-12-11 – 2015-12-14 (×4): 2 via NASAL
  Filled 2015-12-11: qty 30

## 2015-12-11 MED ORDER — FENTANYL CITRATE (PF) 100 MCG/2ML IJ SOLN
25.0000 ug | INTRAMUSCULAR | Status: DC | PRN
  Administered 2015-12-11 (×3): 50 ug via INTRAVENOUS

## 2015-12-11 MED ORDER — MENTHOL 3 MG MT LOZG
1.0000 | LOZENGE | OROMUCOSAL | Status: DC | PRN
Start: 2015-12-11 — End: 2015-12-14

## 2015-12-11 MED ORDER — BISACODYL 10 MG RE SUPP: 10.0000 mg | Freq: Every day | RECTAL | Status: DC | PRN

## 2015-12-11 MED ORDER — BALSALAZIDE DISODIUM 750 MG PO CAPS
2250.0000 mg | ORAL_CAPSULE | Freq: Two times a day (BID) | ORAL | Status: DC
  Administered 2015-12-12 – 2015-12-14 (×3): 2250 mg via ORAL
  Filled 2015-12-11 (×4): qty 3

## 2015-12-11 MED ORDER — METHYLCELLULOSE (LAXATIVE) PO POWD: 1.0000 | Freq: Two times a day (BID) | ORAL | Status: DC

## 2015-12-11 MED ORDER — METHOCARBAMOL 1000 MG/10ML IJ SOLN
500.0000 mg | Freq: Four times a day (QID) | INTRAVENOUS | Status: DC | PRN
  Filled 2015-12-11: qty 5

## 2015-12-11 MED ORDER — HYDROMORPHONE HCL 1 MG/ML IJ SOLN
INTRAMUSCULAR | Status: DC | PRN
  Administered 2015-12-11 (×2): .25 mg via INTRAVENOUS
  Administered 2015-12-11: .5 mg via INTRAVENOUS

## 2015-12-11 MED ORDER — PRAVASTATIN SODIUM 20 MG PO TABS
20.0000 mg | ORAL_TABLET | Freq: Every evening | ORAL | Status: DC
  Administered 2015-12-11 – 2015-12-13 (×3): 20 mg via ORAL
  Filled 2015-12-11 (×3): qty 1

## 2015-12-11 MED ORDER — GLYCOPYRROLATE 0.2 MG/ML IJ SOLN
INTRAMUSCULAR | Status: DC | PRN
  Administered 2015-12-11: 0.2 mg via INTRAVENOUS

## 2015-12-11 MED ORDER — MAGNESIUM CITRATE PO SOLN: 1.0000 | Freq: Once | ORAL | Status: DC | PRN

## 2015-12-11 SURGICAL SUPPLY — 69 items
APL SKNCLS STERI-STRIP NONHPOA (GAUZE/BANDAGES/DRESSINGS) ×2
BANDAGE ELASTIC 6 VELCRO ST LF (GAUZE/BANDAGES/DRESSINGS) ×4 IMPLANT
BANDAGE ESMARK 6X9 LF (GAUZE/BANDAGES/DRESSINGS) ×2 IMPLANT
BENZOIN TINCTURE PRP APPL 2/3 (GAUZE/BANDAGES/DRESSINGS) ×3 IMPLANT
BNDG CMPR 9X6 STRL LF SNTH (GAUZE/BANDAGES/DRESSINGS) ×2
BNDG ESMARK 6X9 LF (GAUZE/BANDAGES/DRESSINGS) ×4
BOWL SMART MIX CTS (DISPOSABLE) ×4 IMPLANT
CAPT KNEE TOTAL 3 ATTUNE ×3 IMPLANT
CEMENT HV SMART SET (Cement) ×8 IMPLANT
CLOSURE STERI-STRIP 1/2X4 (GAUZE/BANDAGES/DRESSINGS) ×1
CLSR STERI-STRIP ANTIMIC 1/2X4 (GAUZE/BANDAGES/DRESSINGS) ×3 IMPLANT
COVER SURGICAL LIGHT HANDLE (MISCELLANEOUS) ×4 IMPLANT
CUFF TOURNIQUET SINGLE 34IN LL (TOURNIQUET CUFF) ×4 IMPLANT
DRAPE EXTREMITY T 121X128X90 (DRAPE) ×4 IMPLANT
DRAPE IMP U-DRAPE 54X76 (DRAPES) ×4 IMPLANT
DRAPE ORTHO SPLIT 77X108 STRL (DRAPES)
DRAPE PROXIMA HALF (DRAPES) IMPLANT
DRAPE SURG ORHT 6 SPLT 77X108 (DRAPES) IMPLANT
DRAPE U-SHAPE 47X51 STRL (DRAPES) ×4 IMPLANT
DURAPREP 26ML APPLICATOR (WOUND CARE) ×4 IMPLANT
ELECT CAUTERY BLADE 6.4 (BLADE) ×4 IMPLANT
ELECT REM PT RETURN 9FT ADLT (ELECTROSURGICAL) ×4
ELECTRODE REM PT RTRN 9FT ADLT (ELECTROSURGICAL) ×2 IMPLANT
GAUZE SPONGE 4X4 12PLY STRL (GAUZE/BANDAGES/DRESSINGS) ×4 IMPLANT
GLOVE BIO SURGEON STRL SZ7.5 (GLOVE) ×4 IMPLANT
GLOVE BIOGEL PI IND STRL 8 (GLOVE) ×2 IMPLANT
GLOVE BIOGEL PI INDICATOR 8 (GLOVE) ×2
GLOVE BIOGEL PI ORTHO PRO SZ8 (GLOVE) ×2
GLOVE PI ORTHO PRO STRL SZ8 (GLOVE) ×2 IMPLANT
GLOVE SURG ORTHO 8.0 STRL STRW (GLOVE) ×4 IMPLANT
GOWN STRL REUS W/ TWL LRG LVL3 (GOWN DISPOSABLE) ×2 IMPLANT
GOWN STRL REUS W/ TWL XL LVL3 (GOWN DISPOSABLE) ×2 IMPLANT
GOWN STRL REUS W/TWL 2XL LVL3 (GOWN DISPOSABLE) ×4 IMPLANT
GOWN STRL REUS W/TWL LRG LVL3 (GOWN DISPOSABLE) ×4
GOWN STRL REUS W/TWL XL LVL3 (GOWN DISPOSABLE) ×4
HANDPIECE INTERPULSE COAX TIP (DISPOSABLE) ×4
HOOD PEEL AWAY FACE SHEILD DIS (HOOD) ×15 IMPLANT
IMMOBILIZER KNEE 22 UNIV (SOFTGOODS) ×4 IMPLANT
KIT BASIN OR (CUSTOM PROCEDURE TRAY) ×4 IMPLANT
KIT ROOM TURNOVER OR (KITS) ×4 IMPLANT
MANIFOLD NEPTUNE II (INSTRUMENTS) ×4 IMPLANT
NDL HYPO 21X1.5 SAFETY (NEEDLE) IMPLANT
NEEDLE HYPO 21X1.5 SAFETY (NEEDLE) IMPLANT
NS IRRIG 1000ML POUR BTL (IV SOLUTION) ×4 IMPLANT
PACK TOTAL JOINT (CUSTOM PROCEDURE TRAY) ×4 IMPLANT
PACK UNIVERSAL I (CUSTOM PROCEDURE TRAY) ×4 IMPLANT
PAD ABD 8X10 STRL (GAUZE/BANDAGES/DRESSINGS) ×4 IMPLANT
PAD ARMBOARD 7.5X6 YLW CONV (MISCELLANEOUS) ×8 IMPLANT
PAD CAST 4YDX4 CTTN HI CHSV (CAST SUPPLIES) ×2 IMPLANT
PADDING CAST ABS 6INX4YD NS (CAST SUPPLIES) ×2
PADDING CAST ABS COTTON 6X4 NS (CAST SUPPLIES) ×1 IMPLANT
PADDING CAST COTTON 4X4 STRL (CAST SUPPLIES) ×4
PADDING CAST COTTON 6X4 STRL (CAST SUPPLIES) ×4 IMPLANT
SET HNDPC FAN SPRY TIP SCT (DISPOSABLE) ×2 IMPLANT
SPONGE GAUZE 4X4 12PLY STER LF (GAUZE/BANDAGES/DRESSINGS) ×3 IMPLANT
SUCTION FRAZIER HANDLE 10FR (MISCELLANEOUS) ×2
SUCTION TUBE FRAZIER 10FR DISP (MISCELLANEOUS) ×2 IMPLANT
SUT MNCRL AB 4-0 PS2 18 (SUTURE) IMPLANT
SUT VIC AB 0 CT1 27 (SUTURE) ×4
SUT VIC AB 0 CT1 27XBRD ANBCTR (SUTURE) ×2 IMPLANT
SUT VIC AB 1 CT1 27 (SUTURE) ×4
SUT VIC AB 1 CT1 27XBRD ANBCTR (SUTURE) ×2 IMPLANT
SUT VIC AB 2-0 CT1 27 (SUTURE) ×4
SUT VIC AB 2-0 CT1 TAPERPNT 27 (SUTURE) ×1 IMPLANT
SUT VIC AB 3-0 SH 8-18 (SUTURE) ×4 IMPLANT
SYR CONTROL 10ML LL (SYRINGE) IMPLANT
TOWEL OR 17X24 6PK STRL BLUE (TOWEL DISPOSABLE) ×4 IMPLANT
TOWEL OR 17X26 10 PK STRL BLUE (TOWEL DISPOSABLE) ×4 IMPLANT
WATER STERILE IRR 1000ML POUR (IV SOLUTION) ×1 IMPLANT

## 2015-12-11 NOTE — Transfer of Care (Signed)
Immediate Anesthesia Transfer of Care Note  Patient: [name]  Procedure(s) Performed: Procedure(s): TOTAL KNEE ARTHROPLASTY (Right)  Patient Location: PACU  Anesthesia Type:Spinal  Level of Consciousness: awake, alert , oriented and patient cooperative  Airway & Oxygen Therapy: Patient Spontanous Breathing and Patient connected to nasal cannula oxygen  Post-op Assessment: Report given to RN, Post -op Vital signs reviewed and stable, Patient moving all extremities X 4 and Patient able to stick tongue midline  Post vital signs: Reviewed and stable  Last Vitals:  Filed Vitals:   12/11/15 0817  BP: 138/73  Pulse: 78  Temp: 37.1 C  Resp: 20    Complications: No apparent anesthesia complications

## 2015-12-11 NOTE — Progress Notes (Signed)
Nurse called into OR room 4 and spoke with Lattie Haw, and informed her that Dr. Mardelle Matte had Ancef 2g ordered for patient, however patient has a penicillin allergy and Nurse wanted to know if Dr. Mardelle Matte wanted to order a different antibiotic. Lattie Haw stated she would call Nurse back after speaking with Dr. Marian Sorrow returned call and stated that another antibiotic would NOT be needed and that Anesthesia would monitor patient carefully during surgery for any type of reaction to Ancef. Will call Pharmacy and inform them of this.

## 2015-12-11 NOTE — Op Note (Signed)
DATE OF SURGERY:  12/11/2015 TIME: 1:22 PM  PATIENT NAME:  [name]   AGE: 65 y.o.    PRE-OPERATIVE DIAGNOSIS:  Right knee osteoarthritis  POST-OPERATIVE DIAGNOSIS:  Same  PROCEDURE:  Procedure(s): TOTAL KNEE ARTHROPLASTY   SURGEON:  Johnny Bridge, MD   ASSISTANT:  Joya Gaskins, OPA-C, present and scrubbed throughout the case, critical for assistance with exposure, retraction, instrumentation, and closure.   OPERATIVE IMPLANTS: Depuy Attune size 4 standard femur with a size 4 RP tibia and a size 5 mm polyethylene rotating platform posterior stabilized bearing with a 38 mm patellar button and 2 bags of Smart set H the bone cement.   PREOPERATIVE INDICATIONS:  BONETA STANDRE is a 65 y.o. year old female with end stage bone on bone degenerative arthritis of the knee who failed conservative treatment, including injections, antiinflammatories, activity modification, and assistive devices, and had significant impairment of their activities of daily living, and elected for Total Knee Arthroplasty. She had preoperative knee arthroscopy, which demonstrated significant chondral changes primarily on the patella but also to some degree on the medial and lateral compartments, preoperative x-rays demonstrated a moderate valgus knee with end-stage degenerative changes particularly on the lateral compartment. She also had significant patella altered.  The risks, benefits, and alternatives were discussed at length including but not limited to the risks of infection, bleeding, nerve injury, stiffness, blood clots, the need for revision surgery, cardiopulmonary complications, among others, and they were willing to proceed. We also discussed the risks for incomplete relief of symptoms, persistent pain, among others.  OPERATIVE FINDINGS AND UNIQUE ASPECTS OF THE CASE:  The patella was fairly thin, measuring only about 21 mm. After the cut I was at 14 mm inferiorly, although still slightly  thicker superiorly at about 16 mm. The knee had fairly significant ligamentous laxity preoperatively, with about 15 of recurvatum, and significant play medially and laterally. The medial compartment was reasonably normal, the undersurface of the patella had extensive grade 4 changes on the medial facet, there was some femoral trochlear wear as well, minimal osteophyte formation, laterally the cartilage is not terrible, but did have some wear. The majority of her disease was really patellofemoral.  The tibia was between a size 3 and the size 4, slight internal rotation of the tibia baseplate provided better cortical coverage matching her proximal anatomy, however I ended up with slight internal rotation of the tibia, maybe 20, and therefore I utilized a rotating platform polyethylene to allow appropriate tracking. The patella had excellent tracking at the completion of the case.There was not too much in the way of posterior femoral hypoplasia of the condyle.   OPERATIVE DESCRIPTION:  The patient was brought to the operative room and placed in a supine position.  Spinal anesthesia was administered.  IV antibiotics were given.  She tolerated Ancef with a test dose. The lower extremity was prepped and draped in the usual sterile fashion.  Time out was performed.  The leg was elevated and exsanguinated and the tourniquet was inflated.  Anterior quadriceps tendon splitting approach was performed.  The patella was everted and osteophytes were removed.  The anterior horn of the medial and lateral meniscus was removed.   The distal femur was opened with the drill and the intramedullary distal femoral cutting jig was utilized, set at 5 degrees resecting 9 mm off the distal femur.  Care was taken to protect the collateral ligaments.  Then the extramedullary tibial cutting jig was utilized making the appropriate cut  using the anterior tibial crest as a reference building in appropriate posterior slope.  Care was  taken during the cut to protect the medial and collateral ligaments.  The proximal tibia was removed along with the posterior horns of the menisci.  The PCL was sacrificed.    The extensor gap was measured and was between a 5 and a 6   The distal femoral sizing jig was applied, taking care to avoid notching.  Then the 4-in-1 cutting jig was applied and the anterior and posterior femur was cut, along with the chamfer cuts.  All posterior osteophytes were removed.  The flexion gap was then measured and was symmetric with the extension gap.  I completed the distal femoral preparation using the appropriate jig to prepare the box.  The patella was then measured, and cut with the saw.  The thickness before the cut was only 21 mm, and I cut the patella with the 7.5 resection jig, and the inferior portion of the patella went down to a size 14 mm, the superior was still somewhat thick, and so I used freehand technique to take the superior patella down to about 79m.  The proximal tibia sized and prepared accordingly with the reamer and the punch, and then all components were trialed with the 5 poly insert.  The tibial tray was slightly internally rotated, so I elected to utilize a rotating platform component. The knee was found to have excellent balance and full motion.    The above named components were then cemented into place and all excess cement was removed.  The real polyethylene implant was placed.  After the cement had cured I released the tourniquet and confirmed excellent hemostasis with no major posterior vessel injury.    The knee was easily taken through a range of motion and the patella tracked well and the knee irrigated copiously and the parapatellar and subcutaneous tissue closed with vicryl, and monocryl with steri strips for the skin.  The wounds were injected with marcaine, and dressed with sterile gauze and the patient was awakened and returned to the PACU in stable and satisfactory  condition.  There were no complications.  Total tourniquet time was approximately 100 minutes.

## 2015-12-11 NOTE — Progress Notes (Signed)
Report given to Rica Koyanagi as caregiver

## 2015-12-11 NOTE — Anesthesia Procedure Notes (Signed)
Spinal Patient location during procedure: OR Start time: 12/11/2015 11:00 AM End time: 12/11/2015 11:02 AM Staffing Anesthesiologist: Jillyn Hidden Performed by: anesthesiologist  Preanesthetic Checklist Completed: patient identified, site marked, surgical consent, pre-op evaluation, timeout performed, IV checked, risks and benefits discussed and monitors and equipment checked Spinal Block Patient position: sitting Prep: DuraPrep Patient monitoring: heart rate Approach: midline Location: L4-5 Injection technique: single-shot Needle Needle type: Quincke  Needle gauge: 22 G Assessment Sensory level: T6 Additional Notes Functioning IV was confirmed and monitors were applied. Sterile prep and drape, including hand hygiene, mask and sterile gloves were used. The patient was positioned and the spine was prepped. The skin was anesthetized with lidocaine.  Free flow of clear CSF was obtained prior to injecting local anesthetic into the CSF.  The spinal needle aspirated freely following injection.  The needle was carefully withdrawn.  The patient tolerated the procedure well. Consent was obtained prior to procedure with all questions answered and concerns addressed.  [place] Pink, MD

## 2015-12-11 NOTE — H&P (Signed)
PREOPERATIVE H&P  Chief Complaint: djd right knee  HPI: [name] is a 65 y.o. female who presents for preoperative history and physical with a diagnosis of djd right knee. Symptoms are rated as moderate to severe, and have been worsening.  This is significantly impairing activities of daily living.  She has elected for surgical management.   She has failed arthroscopic surgery, injections, activity modification, anti-inflammatories, and assistive devices.  Preoperative X-rays demonstrate end stage degenerative changes with osteophyte formation, loss of joint space, subchondral sclerosis.   Past Medical History  Diagnosis Date  . Depression   . Ulcerative colitis 2007  . GERD (gastroesophageal reflux disease)   . Pancreatitis   . COPD (chronic obstructive pulmonary disease) ([place])   . Diverticulitis   . Schatzki's ring   . DDD (degenerative disc disease)   . Chronic back pain   . Arthritis   . PVC's (premature ventricular contractions)   . Hiatal hernia   . Wears dentures     full top-partial bottom  . HOH (hard of hearing)     right  . Degenerative tear of posterior horn of lateral meniscus of right knee 01/27/2014  . Sleep apnea     CPAP at night  . Diabetes mellitus without complication ([place])   . PONV (postoperative nausea and vomiting)     only happened after knee arthroscopy  . Anginal pain ([place])     PATIENT STATES DOCT THINKS IS FIBROMYALGIA  . Shortness of breath dyspnea     WITH EXERTION   . Asthma   . Pneumonia     1 YR  . Headache     HX MIGRAINES    . Fibromyalgia   . 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 Anemia    Past Surgical History  Procedure Laterality Date  . Tonsillectomy    . Dilation and curettage of uterus    . Hernia repair    . Cholecystectomy    . Cervical cone biopsy    . Arch and foot    . Esophagogastroduodenoscopy  01/28/2011    Rourk- Schatzki ring, s/p 15F, otherwise normal/Hiatal hernia otherwise normal stomach D1 and D2  .  Colonoscopy  12/03/05    Rourk-marked inflammatory changes of the rectum, left colon, pan colitis, internal hemorrhoids, sigmoid diverticula  . Esophagogastroduodenoscopy  11/30/08    Rourk-Schatzki's ring status post dilation, hiatal hernia  . Esophagogastroduodenoscopy  07/12/2007    Dilation with 56 French, Schatzki's ring  . Esophagogastroduodenoscopy (egd) with esophageal dilation  09/01/2012    Dr. Gala Romney- schatzki's ring, hiatal hernia  . Colonoscopy N/A 10/20/2013    Dr. Rourk:Colonic diverticulosis. colonic mucosal ulcerations involving segment of sigmoid colon, atypical for UC. sigmoid colon path with chronic mildly active colitis consistent with IBD, other biopsies including ascending, transverse, descending colon and rectum unremarkable.   . Foot arthrodesis  [id number]    both feet  . Knee arthroscopy with lateral menisectomy Right 01/27/2014    Procedure: RIGHT KNEE ARTHROSCOPY WITH PARTIAL LATERAL MENISECTOMY, ;  Surgeon: Johnny Bridge, MD;  Location: [facility];  Service: Orthopedics;  Laterality: Right;  . Esophagogastroduodenoscopy N/A 08/10/2014    Dr.Rourk- prominent schatzki's ring s/p maloney dilation and disruption, moderate sized hiatal hernia  . Savory dilation N/A 08/10/2014    Procedure: SAVORY DILATION;  Surgeon: Daneil Dolin, MD;  [facility];  Service: Endoscopy;  Laterality: N/A;  [name] dilation N/A 08/10/2014  Procedure: MALONEY DILATION;  Surgeon: Daneil Dolin, MD;  [facility];  Service: Endoscopy;  Laterality: N/A;  . Esophagogastroduodenoscopy N/A 09/20/2015    Procedure: ESOPHAGOGASTRODUODENOSCOPY (EGD);  Surgeon: Daneil Dolin, MD;  [facility];  Service: Endoscopy;  Laterality: N/A;  0830  . Esophageal dilation N/A 09/20/2015    Procedure: ESOPHAGEAL DILATION;  Surgeon: Daneil Dolin, MD;  [facility];  Service: Endoscopy;  Laterality: N/A;  . Shoulder open rotator cuff repair      BILAT    Social History   Social History  . Marital Status: Married    Spouse Name: N/A  . Number of Children: 3  . Years of Education: N/A   Occupational History  . 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 Smoking status: Former Smoker -- 1.00 packs/day for 30 years    Types: Cigarettes    Start date: 07/07/1969    Quit date: 08/29/2013  . Smokeless tobacco: Never Used  . Alcohol Use: No  . Drug Use: No  . Sexual Activity:    Partners: Male    Birth Control/ Protection: None     Comment: spouse   Other Topics Concern  . Not on file   Social History Narrative   Family History  Problem Relation Age of Onset  . Colon cancer Neg Hx    Allergies  Allergen Reactions  . Mesalamine Other (See Comments)    Pancreatitis   . Cefprozil Other (See Comments)    Aggravates Ulcerative colitis   . Cefuroxime Axetil Other (See Comments)    Aggravates Ulcerative colitis   . Morphine And Related Hives and Itching  . Penicillins Hives    Has patient had a PCN reaction causing immediate rash, facial/tongue/throat swelling, SOB or lightheadedness with hypotension: No Has patient had a PCN reaction causing severe rash involving mucus membranes or skin necrosis: No Has patient had a PCN reaction that required hospitalization No Has patient had a PCN reaction occurring within the last 10 years: yes If all of the above answers are "NO", then may proceed with Cephalosporin use.   . Aspirin Other (See Comments)    Affects the central nervous system. "shakey"  . Chicken Allergy Nausea And Vomiting and Rash  . Clarithromycin Nausea And Vomiting  . Codeine Other (See Comments)    Hallucinations/bad dreams.  . Entex Other (See Comments)    insomnia   Prior to Admission medications   Medication Sig Start Date End Date Taking? Authorizing Provider  ADVAIR DISKUS 250-50 MCG/DOSE AEPB Inhale 1 puff into the lungs daily. 08/21/15  Yes Historical Provider, MD  albuterol  (PROVENTIL) (2.5 MG/3ML) 0.083% nebulizer solution Take 2.5 mg by nebulization every 6 (six) hours as needed for wheezing or shortness of breath.   Yes Historical Provider, MD  albuterol (PROVENTIL,VENTOLIN) 90 MCG/ACT inhaler Inhale 2 puffs into the lungs every 4 (four) hours as needed for wheezing or shortness of breath.    Yes Historical Provider, MD  aspirin 81 MG tablet Take 81 mg by mouth daily.     Yes Historical Provider, MD  azelastine (ASTELIN) 137 MCG/SPRAY nasal spray 2 sprays by Nasal route 2 (two) times daily. Use in each nostril as directed    Yes Historical Provider, MD  balsalazide (COLAZAL) 750 MG capsule Take 3 capsules (2,250 mg total) by mouth 2 (two) times daily. 03/06/15  Yes Carlis Stable, NP  buPROPion (WELLBUTRIN XL) 150 MG 24  hr tablet Take 150 mg by mouth daily. 10/09/15  Yes Historical Provider, MD  busPIRone (BUSPAR) 15 MG tablet Take 7.5 mg by mouth 2 (two) times daily. 1/2 tablet bid   Yes Historical Provider, MD  Calcium Carb-Cholecalciferol (CALCIUM-VITAMIN D) 600-400 MG-UNIT TABS Take 1 tablet by mouth 2 (two) times daily.   Yes Historical Provider, MD  cetirizine (ZYRTEC) 10 MG tablet Take 10 mg by mouth daily.   Yes Historical Provider, MD  DULoxetine (CYMBALTA) 30 MG capsule Take 30 mg by mouth daily.   Yes Historical Provider, MD  DULoxetine (CYMBALTA) 60 MG capsule Take 60 mg by mouth daily.     Yes Historical Provider, MD  famotidine-calcium carbonate-magnesium hydroxide (PEPCID COMPLETE) 10-800-165 MG CHEW chewable tablet Chew 1 tablet by mouth daily as needed (for breakthrough GERD).    Yes Historical Provider, MD  ferrous gluconate (FERGON) 225 (27 FE) MG tablet Take 240 mg by mouth daily.    Yes Historical Provider, MD  ketotifen (ALAWAY) 0.025 % ophthalmic solution Place 2 drops into both eyes 2 (two) times daily.   Yes Historical Provider, MD  metFORMIN (GLUCOPHAGE-XR) 500 MG 24 hr tablet Take 1,000 mg by mouth every evening.    Yes Historical Provider, MD   methylcellulose (CITRUCEL) oral powder Take 1 packet by mouth 2 (two) times daily.   Yes Historical Provider, MD  miconazole (NEOSPORIN AF) 2 % cream Apply 1 application topically daily as needed (for wound care).   Yes Historical Provider, MD  pravastatin (PRAVACHOL) 20 MG tablet TAKE 1 TABLET EVERY EVENING 10/02/15  Yes Arnoldo Lenis, MD  pregabalin (LYRICA) 100 MG capsule Take 100 mg by mouth 2 (two) times daily.   Yes Historical Provider, MD  traMADol (ULTRAM-ER) 100 MG 24 hr tablet Take 100 mg by mouth at bedtime.   Yes Historical Provider, MD  traZODone (DESYREL) 100 MG tablet Take 100 mg by mouth at bedtime.     Yes Historical Provider, MD  NEXIUM 40 MG capsule TAKE 1 CAPSULE DAILY BEFORE BREAKFAST 12/10/15   Carlis Stable, NP     Positive ROS: All other systems have been reviewed and were otherwise negative with the exception of those mentioned in the HPI and as above.  Physical Exam: General: Alert, no acute distress Cardiovascular: No pedal edema Respiratory: No cyanosis, no use of accessory musculature GI: No organomegaly, abdomen is soft and non-tender Skin: No lesions in the area of chief complaint Neurologic: Sensation intact distally Psychiatric: Patient is competent for consent with normal mood and affect Lymphatic: No axillary or cervical lymphadenopathy  MUSCULOSKELETAL: right knee with crepitance and grinding, and valgus alignment rom 0-130.  Assessment: djd right knee   Plan: Plan for Procedure(s): RIGHT TKA  The risks benefits and alternatives were discussed with the patient including but not limited to the risks of nonoperative treatment, versus surgical intervention including infection, bleeding, nerve injury,  blood clots, cardiopulmonary complications, morbidity, mortality, among others, and they were willing to proceed.   Johnny Bridge, MD Cell [phone]   12/11/2015 7:44 AM

## 2015-12-11 NOTE — Anesthesia Preprocedure Evaluation (Addendum)
Anesthesia Evaluation  Patient identified by MRN, date of birth, ID band Patient awake    Reviewed: Allergy & Precautions, H&P , NPO status , Patient's Chart, lab work & pertinent test results  History of Anesthesia Complications (+) PONV and history of anesthetic complications  Airway Mallampati: II  TM Distance: >3 FB Neck ROM: Full    Dental no notable dental hx. (+) Edentulous Upper, Partial Lower, Dental Advisory Given   Pulmonary sleep apnea and Continuous Positive Airway Pressure Ventilation , COPD,  COPD inhaler, former smoker,    Pulmonary exam normal breath sounds clear to auscultation       Cardiovascular  Rhythm:Regular Rate:Normal  2015 normal Echo   Neuro/Psych  Headaches, PSYCHIATRIC DISORDERS Depression    GI/Hepatic Neg liver ROS, PUD, GERD  Medicated and Controlled,  Endo/Other  diabetes  Renal/GU negative Renal ROS  negative genitourinary   Musculoskeletal  (+) Arthritis , Fibromyalgia -  Abdominal   Peds  Hematology negative hematology ROS (+)   Anesthesia Other Findings Denies anticoagulation medications, no CAD   Reproductive/Obstetrics negative OB ROS                            Anesthesia Physical  Anesthesia Plan  ASA: III  Anesthesia Plan: Spinal   Post-op Pain Management:    Induction: Intravenous  Airway Management Planned: Simple Face Mask  Additional Equipment:   Intra-op Plan:   Post-operative Plan: Extubation in OR  Informed Consent: I have reviewed the patients History and Physical, chart, labs and discussed the procedure including the risks, benefits and alternatives for the proposed anesthesia with the patient or authorized representative who has indicated his/her understanding and acceptance.   Dental advisory given  Plan Discussed with: CRNA  Anesthesia Plan Comments: (Spinal + exparel  May need LMA if upper airway obstruction intraop,  uses CPAP machine at home, pressure of 7cmH20)        Anesthesia Quick Evaluation

## 2015-12-12 ENCOUNTER — Encounter (HOSPITAL_COMMUNITY): Payer: Self-pay | Admitting: Orthopedic Surgery

## 2015-12-12 LAB — CBC
HEMATOCRIT: 30.1 % — AB (ref 36.0–46.0)
HEMOGLOBIN: 9.6 g/dL — AB (ref 12.0–15.0)
MCH: 30.2 pg (ref 26.0–34.0)
MCHC: 31.9 g/dL (ref 30.0–36.0)
MCV: 94.7 fL (ref 78.0–100.0)
Platelets: 216 10*3/uL (ref 150–400)
RBC: 3.18 MIL/uL — ABNORMAL LOW (ref 3.87–5.11)
RDW: 14.8 % (ref 11.5–15.5)
WBC: 9.3 10*3/uL (ref 4.0–10.5)

## 2015-12-12 LAB — BASIC METABOLIC PANEL
ANION GAP: 8 (ref 5–15)
BUN: 7 mg/dL (ref 6–20)
CALCIUM: 8.5 mg/dL — AB (ref 8.9–10.3)
CO2: 25 mmol/L (ref 22–32)
CREATININE: 0.73 mg/dL (ref 0.44–1.00)
Chloride: 101 mmol/L (ref 101–111)
Glucose, Bld: 119 mg/dL — ABNORMAL HIGH (ref 65–99)
Potassium: 3.8 mmol/L (ref 3.5–5.1)
SODIUM: 134 mmol/L — AB (ref 135–145)

## 2015-12-12 LAB — GLUCOSE, CAPILLARY
GLUCOSE-CAPILLARY: 112 mg/dL — AB (ref 65–99)
GLUCOSE-CAPILLARY: 112 mg/dL — AB (ref 65–99)
GLUCOSE-CAPILLARY: 130 mg/dL — AB (ref 65–99)
Glucose-Capillary: 93 mg/dL (ref 65–99)

## 2015-12-12 NOTE — Discharge Instructions (Addendum)

## 2015-12-12 NOTE — Evaluation (Addendum)
Physical Therapy Evaluation Patient Details Name: [name] MRN: [id number] DOB: [birth year] Today's Date: 12/12/2015   History of Present Illness  Patient with complex medical history presents s/p R TKA.  Clinical Impression  Patient demonstrates deficits in functional mobility as indicated below. Will need continued skilled PT to address deficits and maximize function. Will see as indicated and progress as tolerated. Will follow up for BID session this pm.    Follow Up Recommendations Home health PT;Supervision/Assistance - 24 hour    Equipment Recommendations  3in1 (PT)    Recommendations for Other Services       Precautions / Restrictions Precautions Precautions: Knee Precaution Comments: verbally reviewed with patient Restrictions Weight Bearing Restrictions: Yes RLE Weight Bearing: Weight bearing as tolerated      Mobility  Bed Mobility Overal bed mobility: Needs Assistance Bed Mobility: Supine to Sit     Supine to sit: Supervision     General bed mobility comments: No physical assist required  Transfers Overall transfer level: Needs assistance Equipment used: Rolling walker (2 wheeled) Transfers: Sit to/from Stand Sit to Stand: Min guard;Min assist         General transfer comment: Vcs for hand placement and safety with use of RW,  Min assist for initial attempt with poor positioning from bed required patient to re position, 2nd attempt min guard, then min guard from elevated commode over toilet  Ambulation/Gait Ambulation/Gait assistance: Min guard  Ambulation Distance: 60 ft Assistive device: Rolling walker (2 wheeled) Gait Pattern/deviations: Step-to pattern;Decreased stride length;Antalgic Gait velocity: decreased Gait velocity interpretation: <1.8 ft/sec, indicative of risk for recurrent falls General Gait Details: heavy relaince on UE support via RW, emerging step through, limited by pain.   Stairs            Wheelchair Mobility     Modified Rankin (Stroke Patients Only)       Balance Overall balance assessment: Needs assistance Sitting-balance support: Bilateral upper extremity supported Sitting balance-Leahy Scale: Good     Standing balance support: During functional activity Standing balance-Leahy Scale: Poor                               Pertinent Vitals/Pain Pain Assessment: 0-10 Pain Score: 4  Pain Location: right knee Pain Descriptors / Indicators: Guarding;Aching Pain Intervention(s): Monitored during session;Repositioned    Home Living Family/patient expects to be discharged to:: Private residence Living Arrangements: Spouse/significant other Available Help at Discharge: Family Type of Home: Mobile home Home Access: Stairs to enter Entrance Stairs-Rails: Can reach both Entrance Stairs-Number of Steps: 2 Home Layout: One level Home Equipment: Walker - 2 wheels;Cane - single point      Prior Function Level of Independence: Needs assistance   Gait / Transfers Assistance Needed: uses cane for mobility  ADL's / Homemaking Assistance Needed: had some help with ADLs (husband)        Hand Dominance   Dominant Hand: Right    Extremity/Trunk Assessment   Upper Extremity Assessment: Overall WFL for tasks assessed           Lower Extremity Assessment: RLE deficits/detail         Communication      Cognition Arousal/Alertness: Awake/alert Behavior During Therapy: WFL for tasks assessed/performed Overall Cognitive Status: Within Functional Limits for tasks assessed                      General Comments  Exercises        Assessment/Plan    PT Assessment Patient needs continued PT services  PT Diagnosis Difficulty walking;Abnormality of gait;Acute pain   PT Problem List Decreased strength;Decreased range of motion;Decreased activity tolerance;Decreased mobility;Decreased safety awareness;Pain  PT Treatment Interventions DME instruction;Gait  training;Stair training;Functional mobility training;Therapeutic activities;Therapeutic exercise;Balance training;Patient/family education   PT Goals (Current goals can be found in the Care Plan section) Acute Rehab PT Goals Patient Stated Goal: to go home PT Goal Formulation: With patient Time For Goal Achievement: 12/26/15    Frequency 7X/week   Barriers to discharge        Co-evaluation               End of Session Equipment Utilized During Treatment: Gait belt Activity Tolerance: Patient limited by pain Patient left: in chair;with call bell/phone within reach Nurse Communication: Mobility status         Time: 9604-5409 PT Time Calculation (min) (ACUTE ONLY): 30 min   Charges:   PT Evaluation $PT Eval Moderate Complexity: 1 Procedure     PT G CodesDuncan Dull 2016/01/07, 11:38 AM  Alben Deeds, PT DPT  [phone]

## 2015-12-12 NOTE — Progress Notes (Signed)
Placed patient on CPAP via auto-mode for the night with minium pressure set at 6cm and maximum pressure set at 20cm

## 2015-12-12 NOTE — Care Management Note (Signed)
Case Management Note  Patient Details  Name: [name] MRN: [id number] Date of Birth: [birth year]  Subjective/Objective:   65 yr old female s/p right unicompartmental knee.                Action/Plan:  Case manager spoke with patient and her husband concerning home health and DME needs. Patient has rolling walker with front wheels, will need a 3in1. Case manager has requested 3in1. She will go to outpatient therapy, her appointment is scheduled for Friday, 12/14/15.Patient will have family support at discharge.   Expected Discharge Date:    12/13/15              Expected Discharge Plan:   Home/self care  In-House Referral:     Discharge planning Services     Post Acute Care Choice:    Choice offered to:     DME Arranged:   3in1 DME Agency:   [place]  HH Arranged:   NA HH Agency:     Status of Service:   completed  Medicare Important Message Given:    Date Medicare IM Given:    Medicare IM give by:    Date Additional Medicare IM Given:    Additional Medicare Important Message give by:     If discussed at [place] of Stay Meetings, dates discussed:    Additional Comments:  Ninfa Meeker, RN 12/12/2015, 2:05 PM

## 2015-12-12 NOTE — Evaluation (Signed)
Occupational Therapy Evaluation Patient Details Name: [name] MRN: [id number] DOB: 1951/03/22 Today's Date: 12/12/2015    History of Present Illness Patient with complex medical history presents s/p R TKA.   Clinical Impression   Pt reports she was independent with ADLs PTA. Currently pt min guard-min assist for functional mobility and ADLs. Began safety and ADL education with pt. Pt planning to d/c home with 24/7 supervision from her husband. Pt would benefit from continued skilled OT to address established goals.    Follow Up Recommendations  No OT follow up;Supervision/Assistance - 24 hour    Equipment Recommendations  3 in 1 bedside comode    Recommendations for Other Services       Precautions / Restrictions Precautions Precautions: Knee Precaution Comments: verbally reviewed with patient Restrictions Weight Bearing Restrictions: Yes RLE Weight Bearing: Weight bearing as tolerated      Mobility Bed Mobility Overal bed mobility: Needs Assistance Bed Mobility: Supine to Sit     Supine to sit: Supervision     General bed mobility comments: No physical assist required  Transfers Overall transfer level: Needs assistance Equipment used: Rolling walker (2 wheeled) Transfers: Sit to/from Stand Sit to Stand: Min guard;Min assist         General transfer comment: Vcs for hand placement and safety with use of RW,  Min assist for initial attempt with poor positioning from bed required patient to re position, 2nd attempt min guard, then min guard from elevated commode over toilet    Balance Overall balance assessment: Needs assistance Sitting-balance support: Bilateral upper extremity supported Sitting balance-Leahy Scale: Good     Standing balance support: No upper extremity supported;During functional activity Standing balance-Leahy Scale: Poor Standing balance comment: Able to stand at sink and wash hands without UE support                             ADL Overall ADL's : Needs assistance/impaired Eating/Feeding: Set up;Sitting   Grooming: Supervision/safety;Standing;Wash/dry hands   Upper Body Bathing: Set up;Sitting   Lower Body Bathing: Minimal assistance;Sit to/from stand   Upper Body Dressing : Set up;Sitting   Lower Body Dressing: Minimal assistance;Sit to/from stand Lower Body Dressing Details (indicate cue type and reason): Pt with difficulty reaching down to feet to don socks. Pt reports husband able to assist as needed. Educated pt on compensatory strategies for LB ADLs.  Toilet Transfer: Min guard;Ambulation;BSC;RW (BSC over toilet)   Toileting- Clothing Manipulation and Hygiene: Min guard;Sit to/from stand       Functional mobility during ADLs: Min guard;Rolling walker General ADL Comments: No family present for OT eval. Educated on home safety, ice for edema and pain.       Vision     Perception     Praxis      Pertinent Vitals/Pain Pain Assessment: 0-10 Pain Score: 4  Pain Location: R knee Pain Descriptors / Indicators: Guarding;Aching Pain Intervention(s): Limited activity within patient's tolerance;Monitored during session;Repositioned     Hand Dominance Right   Extremity/Trunk Assessment Upper Extremity Assessment Upper Extremity Assessment: Overall WFL for tasks assessed   Lower Extremity Assessment Lower Extremity Assessment: RLE deficits/detail RLE: Unable to fully assess due to pain   Cervical / Trunk Assessment Cervical / Trunk Assessment: Normal   Communication Communication Communication: No difficulties   Cognition Arousal/Alertness: Awake/alert Behavior During Therapy: WFL for tasks assessed/performed Overall Cognitive Status: Within Functional Limits for tasks assessed  General Comments       Exercises       Shoulder Instructions      Home Living Family/patient expects to be discharged to:: Private residence Living  Arrangements: Spouse/significant other Available Help at Discharge: Family Type of Home: Mobile home Home Access: Stairs to enter Entrance Stairs-Number of Steps: 2 Entrance Stairs-Rails: Can reach both Home Layout: One level     Bathroom Shower/Tub: Tub/shower unit;Curtain Shower/tub characteristics: Architectural technologist: Standard     Home Equipment: Environmental consultant - 2 wheels;Cane - single point          Prior Functioning/Environment Level of Independence: Needs assistance  Gait / Transfers Assistance Needed: uses cane for mobility ADL's / Homemaking Assistance Needed: had some help with ADLs (husband)        OT Diagnosis: Generalized weakness;Acute pain   OT Problem List: Decreased strength;Decreased range of motion;Decreased activity tolerance;Impaired balance (sitting and/or standing);Decreased knowledge of use of DME or AE;Decreased knowledge of precautions;Pain   OT Treatment/Interventions:      OT Goals(Current goals can be found in the care plan section) Acute Rehab OT Goals Patient Stated Goal: to go home OT Goal Formulation: With patient Time For Goal Achievement: 12/26/15 Potential to Achieve Goals: Good ADL Goals Pt Will Perform Lower Body Bathing: sit to/from stand;with supervision Pt Will Perform Lower Body Dressing: with supervision;sit to/from stand Pt Will Transfer to Toilet: with supervision;ambulating;bedside commode (over toilet) Pt Will Perform Toileting - Clothing Manipulation and hygiene: with supervision;sit to/from stand Pt Will Perform Tub/Shower Transfer: Tub transfer;with supervision;ambulating;3 in 1;rolling walker  OT Frequency: Min 2X/week   Barriers to D/C:            Co-evaluation PT/OT/SLP Co-Evaluation/Treatment: Yes Reason for Co-Treatment: For patient/therapist safety   OT goals addressed during session: ADL's and self-care      End of Session Equipment Utilized During Treatment: Gait belt;Rolling walker  Activity Tolerance:  Patient tolerated treatment well Patient left: in chair;with call bell/phone within reach   Time: 0937-1007 OT Time Calculation (min): 30 min Charges:  OT General Charges $OT Visit: 1 Procedure OT Evaluation $OT Eval Moderate Complexity: 1 Procedure G-Codes:     Binnie Kand M.S., OTR/L Pager: [phone]  12/12/2015, 11:49 AM

## 2015-12-12 NOTE — Progress Notes (Signed)
Patient ID: [name], female   DOB: 1950-10-01, 65 y.o.   MRN: [id number]     Subjective:  Patient reports pain as mild to moderate.  Patient in bed and in no acute distress.  Denies any CP or SOB  Objective:   VITALS:   Filed Vitals:   12/11/15 2156 12/12/15 0016 12/12/15 0042 12/12/15 0442  BP: 106/50  126/81 124/53  Pulse: 82 78 68 80  Temp: 99.1 F (37.3 C)  99.1 F (37.3 C) 98.8 F (37.1 C)  TempSrc:   Oral Oral  Resp: 13 18 16 17   Weight:      SpO2: 95% 96% 98% 97%    ABD soft Sensation intact distally Dorsiflexion/Plantar flexion intact Incision: dressing C/D/I and no drainage  EHL FHL firing  Lab Results  Component Value Date   WBC 8.4 11/29/2015   HGB 12.6 11/29/2015   HCT 38.2 11/29/2015   MCV 94.3 11/29/2015   PLT 240 11/29/2015   BMET    Component Value Date/Time   NA 139 11/29/2015 1310   NA 137 02/07/2012 1301   K 3.7 11/29/2015 1310   K 4.5 02/07/2012 1301   CL 105 11/29/2015 1310   CL 100 02/07/2012 1301   CO2 25 11/29/2015 1310   GLUCOSE 95 11/29/2015 1310   BUN 9 11/29/2015 1310   BUN 12 02/07/2012 1301   CREATININE 0.92 11/29/2015 1310   CREATININE 0.82 02/07/2012 1301   CALCIUM 9.4 11/29/2015 1310   CALCIUM 9.5 02/07/2012 1301   GFRNONAA >60 11/29/2015 1310   GFRAA >60 11/29/2015 1310     Assessment/Plan: 1 Day Post-Op   Principal Problem:   Primary localized osteoarthritis of right knee Active Problems:   Status post total right knee replacement   Advance diet Up with therapy Plan for discharge tomorrow WBAT Dry dressing PRN    Remonia Richter 12/12/2015, 7:10 AM  Discussed and agree with above.    Marchia Bond, MD Cell [phone]

## 2015-12-12 NOTE — Progress Notes (Signed)
Utilization review completed.  

## 2015-12-12 NOTE — Progress Notes (Signed)
Physical Therapy Treatment Patient Details Name: [name] MRN: [id number] DOB: May 29, 1951 Today's Date: 12/12/2015    History of Present Illness Patient with complex medical history presents s/p R TKA.    PT Comments    Patient seen for mobility progression, ambulated increased distance in hall this pm but remains limited by pain and difficulty tolerated increased physical activity. Will follow up tomorrow for preparation for discharge. Recommend BID sessions prior to discharge.   Follow Up Recommendations  Home health PT;Supervision/Assistance - 24 hour     Equipment Recommendations  3in1 (PT)    Recommendations for Other Services       Precautions / Restrictions Precautions Precautions: Knee Precaution Comments: verbally reviewed with patient Restrictions Weight Bearing Restrictions: Yes RLE Weight Bearing: Weight bearing as tolerated    Mobility  Bed Mobility Overal bed mobility: Needs Assistance Bed Mobility: Supine to Sit;Sit to Supine     Supine to sit: Supervision Sit to supine: Min assist   General bed mobility comments: Min assist to elevate LE to return to bed  Transfers Overall transfer level: Needs assistance Equipment used: Rolling walker (2 wheeled) Transfers: Sit to/from Stand Sit to Stand: Min guard;Min assist         General transfer comment: min guard for safety  Ambulation/Gait Ambulation/Gait assistance: Min guard Ambulation Distance (Feet): 120 Feet Assistive device: Rolling walker (2 wheeled) Gait Pattern/deviations: Step-to pattern;Decreased stride length;Antalgic Gait velocity: decreased Gait velocity interpretation: Below normal speed for age/gender General Gait Details: heavy relaince on UE support via RW, emerging step through, limited by pain.    Stairs            Wheelchair Mobility    Modified Rankin (Stroke Patients Only)       Balance     Sitting balance-Leahy Scale: Good     Standing balance  support: During functional activity Standing balance-Leahy Scale: Poor Standing balance comment: required assist when attempting to pull pants up in standing                    Cognition Arousal/Alertness: Awake/alert Behavior During Therapy: WFL for tasks assessed/performed Overall Cognitive Status: Within Functional Limits for tasks assessed                      Exercises      General Comments        Pertinent Vitals/Pain Pain Assessment: 0-10 Pain Score: 7  Pain Location: right knee Pain Descriptors / Indicators: Guarding;Aching;Spasm Pain Intervention(s): Monitored during session;Repositioned    Home Living                      Prior Function            PT Goals (current goals can now be found in the care plan section) Acute Rehab PT Goals Patient Stated Goal: to go home PT Goal Formulation: With patient Time For Goal Achievement: 12/26/15 Progress towards PT goals: Progressing toward goals    Frequency  7X/week    PT Plan Current plan remains appropriate    Co-evaluation             End of Session Equipment Utilized During Treatment: Gait belt Activity Tolerance: Patient limited by fatigue;Patient limited by pain Patient left: in bed;with call bell/phone within reach     Time: 1550-1609 PT Time Calculation (min) (ACUTE ONLY): 19 min  Charges:  $Gait Training: 8-22 mins  G CodesDuncan Dull 2015-12-18, 4:15 PM Alben Deeds, [place] DPT  [phone]

## 2015-12-13 ENCOUNTER — Encounter (HOSPITAL_COMMUNITY): Payer: Self-pay | Admitting: General Practice

## 2015-12-13 LAB — CBC
HCT: 30.8 % — ABNORMAL LOW (ref 36.0–46.0)
Hemoglobin: 9.8 g/dL — ABNORMAL LOW (ref 12.0–15.0)
MCH: 29.9 pg (ref 26.0–34.0)
MCHC: 31.8 g/dL (ref 30.0–36.0)
MCV: 93.9 fL (ref 78.0–100.0)
PLATELETS: 220 10*3/uL (ref 150–400)
RBC: 3.28 MIL/uL — AB (ref 3.87–5.11)
RDW: 14.7 % (ref 11.5–15.5)
WBC: 8.5 10*3/uL (ref 4.0–10.5)

## 2015-12-13 LAB — URINALYSIS, ROUTINE W REFLEX MICROSCOPIC
Bilirubin Urine: NEGATIVE
GLUCOSE, UA: NEGATIVE mg/dL
Hgb urine dipstick: NEGATIVE
KETONES UR: NEGATIVE mg/dL
LEUKOCYTES UA: NEGATIVE
NITRITE: NEGATIVE
PROTEIN: NEGATIVE mg/dL
Specific Gravity, Urine: 1.005 (ref 1.005–1.030)
pH: 6 (ref 5.0–8.0)

## 2015-12-13 LAB — GLUCOSE, CAPILLARY
GLUCOSE-CAPILLARY: 110 mg/dL — AB (ref 65–99)
GLUCOSE-CAPILLARY: 121 mg/dL — AB (ref 65–99)
GLUCOSE-CAPILLARY: 105 mg/dL — AB (ref 65–99)

## 2015-12-13 MED ORDER — ALPRAZOLAM 0.5 MG PO TABS
0.5000 mg | ORAL_TABLET | Freq: Once | ORAL | Status: AC
  Administered 2015-12-13: 0.5 mg via ORAL
  Filled 2015-12-13: qty 1

## 2015-12-13 NOTE — Progress Notes (Signed)
   12/13/15 0109  What Happened  Was fall witnessed? No  Was patient injured? No  Patient found on floor  Found by Staff-comment  Stated prior activity bathroom-unassisted  Follow Up  MD notified Carlyon Shadow, PA  Time MD notified [phone] (first page at [facility]; 2nd page 0152; 3rd page @0230 )  Family notified No- patient refusal  Time family notified (n/a)  Additional tests No  Progress note created (see row info) Yes  Adult Fall Risk Assessment  Risk Factor Category (scoring not indicated) Fall has occurred during this admission (document High fall risk)  Patient's Fall Risk High Fall Risk (>13 points)  Adult Fall Risk Interventions  Required Bundle Interventions *See Row Information* High fall risk - low, moderate, and high requirements implemented  Additional Interventions Fall risk signage;Secure all tubes/drains;Bed alarm not indicated with the bundle  Fall with Injury Screening  Risk For Fall Injury- See Row Information  E;B;Nurse judgement  Intervention(s) for 2 or more risk criteria identified Low Bed  Vitals  Temp 99 F (37.2 C)  BP 138/67 mmHg  BP Location Left Arm  BP Method Automatic  Patient Position (if appropriate) Sitting  Pulse Rate (!) 113  Resp 16  Oxygen Therapy  SpO2 93 %  O2 Device Room Air  Pain Assessment  Pain Assessment No/denies pain  PCA/Epidural/Spinal Assessment  Respiratory Pattern Regular;Labored  Neurological  Neuro (WDL) WDL  Level of Consciousness Alert  Orientation Level Oriented X4  Cognition Appropriate at baseline;Follows commands  RLE Motor Response Purposeful movement;Responds to commands  RLE Sensation Pain  RLE Motor Strength 4  LLE Motor Response Purposeful movement;Responds to commands  LLE Sensation Full sensation  Neuro Symptoms Anxiety  Neuro symptoms relieved by Rest  Musculoskeletal  Musculoskeletal (WDL) X (no change from baseline)  Assistive Device BSC;Front wheel walker  Generalized Weakness Yes  Weight Bearing  Restrictions Yes  RLE Weight Bearing WBAT  Musculoskeletal Details  Right Knee Limited movement;Ortho/Supportive Device  Integumentary  Integumentary (WDL) WDL  Skin Integrity Intact;Surgical Incision (see LDA)

## 2015-12-13 NOTE — Progress Notes (Signed)
Patient is having urinary frequency and calls out every 20-30 minutes. This RN sent out a UA which showed only cloudiness. Everything else looked to be normal.  Patient states that, "she is nervous and worried about going home". She also states that, "she sometimes has this frequency at home when she gets nervous". Called on call PA to get a one time dose of 0.5 xanax to see if it will help with the nervousness.   Patient states that, "she feels weak". This patient is unable to pull herself up from the bed without 2 person assist. This patient stated that, "she did really well and walked twice with PT". Patient seems to think that she did too much walking and it is causing her weakness. Will continue to monitor.

## 2015-12-13 NOTE — Progress Notes (Addendum)
Physical Therapy Treatment Patient Details Name: [name] MRN: [id number] DOB: August 06, 1951 Today's Date: 12/13/2015    History of Present Illness Patient with complex medical history presents s/p R TKA.    PT Comments    Patient significantly limited by pain this session. Limited to in room mobility and extensive PROM by therapist while seated in chair.  Patient appears very upset by lack of progress and amount of pain compared to yesterday. Attempted to reassure patient and comfort her. Will continue to see and progress as tolerated. Not ready for d/c, MD aware.   Follow Up Recommendations  Home health PT;Supervision/Assistance - 24 hour     Equipment Recommendations  3in1 (PT)    Recommendations for Other Services       Precautions / Restrictions Precautions Precautions: Knee Precaution Comments: verbally reviewed with patient Restrictions Weight Bearing Restrictions: Yes RLE Weight Bearing: Weight bearing as tolerated    Mobility  Bed Mobility Overal bed mobility: Needs Assistance Bed Mobility: Supine to Sit;Sit to Supine     Supine to sit: Supervision    General bed mobility comments: min assist to come to EOB, significant pain when transitioning to EOB upright position  Transfers Overall transfer level: Needs assistance Equipment used: Rolling walker (2 wheeled)   Sit to Stand: Mod assist         General transfer comment: increased assist required to elevate to standing today secondary to increased pain (performed from bed and commode over toilet)  Ambulation/Gait Ambulation/Gait assistance: Min assist Ambulation Distance (Feet): 20 Feet Assistive device: Rolling walker (2 wheeled) Gait Pattern/deviations: Step-to pattern;Decreased stride length;Antalgic Gait velocity: decreased   General Gait Details: significantly limited by pain today. heavy relaince on UE support via RW, emerging step through, limited by pain.    Stairs             Wheelchair Mobility    Modified Rankin (Stroke Patients Only)       Balance     Sitting balance-Leahy Scale: Good     Standing balance support: During functional activity Standing balance-Leahy Scale: Poor                      Cognition Arousal/Alertness: Awake/alert Behavior During Therapy: WFL for tasks assessed/performed Overall Cognitive Status: Within Functional Limits for tasks assessed                      Exercises      General Comments General comments (skin integrity, edema, etc.): performed 5 minutes of PROM to right knee while patient seated in chair. Decreased tolerance secodnary to pain, ROM improved with repetition      Pertinent Vitals/Pain Pain Assessment: 0-10 Pain Score: 9  Pain Location: right knee with movement Pain Descriptors / Indicators: Aching;Grimacing;Guarding;Sharp;Sore    Home Living                      Prior Function            PT Goals (current goals can now be found in the care plan section) Acute Rehab PT Goals Patient Stated Goal: to go home PT Goal Formulation: With patient Time For Goal Achievement: 12/26/15 Progress towards PT goals: Not progressing toward goals - comment (significant pain limiting factor today)    Frequency  7X/week    PT Plan Current plan remains appropriate    Co-evaluation             End of  Session Equipment Utilized During Treatment: Gait belt Activity Tolerance: Patient limited by fatigue;Patient limited by pain Patient left: in chair;with call bell/phone within reach, family/visitor present     Time: 0945-1006 PT Time Calculation (min) (ACUTE ONLY): 21 min  Charges:  $Therapeutic Activity: 8-22 mins                    G CodesDuncan Dull Jan 07, 2016, 10:56 AM Alben Deeds, PT DPT  [phone]

## 2015-12-13 NOTE — Progress Notes (Signed)
Approximately 0105, the nurse tech was called to this patient's room to assist with the bedside commode. Patient did not wait on staff to assist her to the bedside commode. Patient tried to stand without any assistive devices and slid down to the floor. The nurse tech went into the room to assist this patient and found the patient sitting on the floor. Patient denies hitting her head, no LOC, no additional injuries reported per patient. Vitals stable 138/67, HR 113, O2 91% RA, temp 37.2 and respirations 16. Patient also denies hitting her right surgical knee. MD/PA on call was paged out three times (0118; 0152; and 0230). Return page at 0241. PA Carlyon Shadow is aware of the fall and for this nurse to call with any changes in patient's status. Will continue to monitor.

## 2015-12-13 NOTE — Progress Notes (Addendum)
Occupational Therapy Treatment Patient Details Name: [name] MRN: [id number] DOB: 09-25-1951 Today's Date: 12/13/2015    History of present illness Patient with complex medical history presents s/p R TKA.   OT comments  Pt limited by pain and demonstrating increased anxious behavior this session. Pt sitting EOB and reported that she had soiled the bed and floor while attempting to get up since no one was assisting her to the bathroom per pt report. Pt very tearful and provided pt with encouragement and comfort to assure her that incidents like this happen especially after major surgery - pt stated "I feel much better with you in here and having someone to talk to." Will continue to follow acutely to address OT needs and goals.   Follow Up Recommendations  Home health OT;Supervision/Assistance - 24 hour    Equipment Recommendations  3 in 1 bedside comode    Recommendations for Other Services      Precautions / Restrictions Precautions Precautions: Knee Precaution Booklet Issued: No Precaution Comments: Reviewed not placing pillow, ice pack or other object underneath R knee Restrictions Weight Bearing Restrictions: Yes RLE Weight Bearing: Weight bearing as tolerated       Mobility Bed Mobility Overal bed mobility: Needs Assistance Bed Mobility: Sit to Supine       Sit to supine: Min assist   General bed mobility comments: Min assist to elevate RLE onto bed and to reposition self once supine in bed.   Transfers Overall transfer level: Needs assistance Equipment used: Rolling walker (2 wheeled) Transfers: Sit to/from Stand Sit to Stand: Min assist         General transfer comment: Min assist for elevation to upright from bed and from commode with modified hand placement using one hand on surface and one hand on RW. Assist to stabilize RW upon standing.     Balance Overall balance assessment: Needs assistance Sitting-balance support: No upper extremity  supported;Feet supported Sitting balance-Leahy Scale: Fair     Standing balance support: Bilateral upper extremity supported Standing balance-Leahy Scale: Poor Standing balance comment: heavy reliance on UE support                   ADL Overall ADL's : Needs assistance/impaired     Grooming: Wash/dry hands;Sitting       Lower Body Bathing: Set up;Sitting/lateral leans Lower Body Bathing Details (indicate cue type and reason): to wash perineal area only Upper Body Dressing : Set up;Sitting   Lower Body Dressing: Moderate assistance;Sit to/from stand Lower Body Dressing Details (indicate cue type and reason): To don/doff socks and assist to stand Toilet Transfer: Minimal assistance;Ambulation;BSC;RW;Grab bars;Cueing for safety Toilet Transfer Details (indicate cue type and reason): Cues for body positioning and proper use of RW to complete toilet transfer [place] and Hygiene: Min guard;Sit to/from stand       Functional mobility during ADLs: Minimal assistance;Rolling walker General ADL Comments: Pt sitting EOB and reported soiling the bed and floor. Pt very tearful and stated she felt embarassed and was feeling a lot of pain. No family present for OT session.      Vision                     Perception     Praxis      Cognition   Behavior During Therapy: [facility] for tasks assessed/performed Overall Cognitive Status: Within Functional Limits for tasks assessed  Extremity/Trunk Assessment               Exercises     Shoulder Instructions       General Comments      Pertinent Vitals/ Pain       Pain Assessment: 0-10 Pain Score: 8  Pain Location: R knee Pain Descriptors / Indicators: Aching;Crying;Sore Pain Intervention(s): Limited activity within patient's tolerance;Monitored during session;Repositioned;Ice applied;RN gave pain meds during session  [place] expects to be  discharged to:: Private residence Living Arrangements: Spouse/significant other                                      Prior Functioning/Environment              Frequency Min 2X/week     Progress Toward Goals  OT Goals(current goals can now be found in the care plan section)  Progress towards OT goals: Progressing toward goals  Acute Rehab OT Goals Patient Stated Goal: to go home OT Goal Formulation: With patient Time For Goal Achievement: 12/26/15 Potential to Achieve Goals: Good ADL Goals Pt Will Perform Lower Body Bathing: sit to/from stand;with supervision Pt Will Perform Lower Body Dressing: with supervision;sit to/from stand Pt Will Transfer to Toilet: with supervision;ambulating;bedside commode Pt Will Perform Toileting - Clothing Manipulation and hygiene: with supervision;sit to/from stand Pt Will Perform Tub/Shower Transfer: Tub transfer;with supervision;ambulating;3 in 1;rolling walker  Plan Discharge plan needs to be updated    Co-evaluation                 End of Session Equipment Utilized During Treatment: Gait belt;Rolling walker   Activity Tolerance Patient limited by pain   Patient Left in bed;with call bell/phone within reach;with SCD's reapplied;with bed alarm set   Nurse Communication Mobility status        Time: 3810-1751 OT Time Calculation (min): 26 min  Charges: OT General Charges $OT Visit: 1 Procedure OT Treatments $Self Care/Home Management : 23-37 mins  Redmond Baseman, OTR/L Pager: [phone] 12/13/2015, 5:28 PM

## 2015-12-13 NOTE — Discharge Summary (Signed)
Physician Discharge Summary  Patient ID: [name] MRN: [id number] DOB/AGE: 1950/10/19 65 y.o.  Admit date: 12/11/2015 Discharge date: 12/14/2015  Admission Diagnoses:  Primary localized osteoarthritis of right knee  Discharge Diagnoses:  Principal Problem:   Primary localized osteoarthritis of right knee Active Problems:   Status post total right knee replacement   Past Medical History  Diagnosis Date  . Depression   . Ulcerative colitis 2007  . GERD (gastroesophageal reflux disease)   . Pancreatitis   . COPD (chronic obstructive pulmonary disease) ([place])   . Diverticulitis   . Schatzki's ring   . DDD (degenerative disc disease)   . Chronic back pain   . Arthritis   . PVC's (premature ventricular contractions)   . Hiatal hernia   . Wears dentures     full top-partial bottom  . HOH (hard of hearing)     right  . Degenerative tear of posterior horn of lateral meniscus of right knee 01/27/2014  . Sleep apnea     CPAP at night  . Diabetes mellitus without complication ([place])   . PONV (postoperative nausea and vomiting)     only happened after knee arthroscopy  . Anginal pain ([place])     PATIENT STATES DOCT THINKS IS FIBROMYALGIA  . Shortness of breath dyspnea     WITH EXERTION   . Asthma   . Pneumonia     1 YR  . Headache     HX MIGRAINES    . Fibromyalgia   . [place])     1980  CERVICAL  . Anemia   . Primary localized osteoarthritis of right knee 12/11/2015    Surgeries: Procedure(s): TOTAL KNEE ARTHROPLASTY on 12/11/2015   Consultants (if any):    Discharged Condition: Improved  Hospital Course: [name] is an 65 y.o. female who was admitted 12/11/2015 with a diagnosis of Primary localized osteoarthritis of right knee and went to the operating room on 12/11/2015 and underwent the above named procedures.    She was given perioperative antibiotics:  Anti-infectives    Start     Dose/Rate Route Frequency Ordered Stop   12/11/15 1800  ceFAZolin  (ANCEF) IVPB 2 g/50 mL premix     2 g 100 mL/hr over 30 Minutes Intravenous Every 6 hours 12/11/15 1756 12/12/15 0030   12/11/15 0945  ceFAZolin (ANCEF) 2 g in dextrose 5 % 50 mL IVPB     2 g 100 mL/hr over 30 Minutes Intravenous  Once 12/11/15 0941 12/11/15 1117   12/11/15 0930  ceFAZolin (ANCEF) 2 g in dextrose 5 % 50 mL IVPB  Status:  Discontinued     2 g 100 mL/hr over 30 Minutes Intravenous  Once 12/11/15 0924 12/11/15 0925    .  She was given sequential compression devices, early ambulation, and xarleto for DVT prophylaxis.  She benefited maximally from the hospital stay and there were no complications.    Recent vital signs:  Filed Vitals:   12/13/15 0349 12/13/15 0458  BP: 146/71 123/71  Pulse: 103 97  Temp: 99.2 F (37.3 C) 99.7 F (37.6 C)  Resp: 16 16    Recent laboratory studies:  Lab Results  Component Value Date   HGB 9.8* 12/13/2015   HGB 9.6* 12/12/2015   HGB 12.6 11/29/2015   Lab Results  Component Value Date   WBC 8.5 12/13/2015   PLT 220 12/13/2015   No results found for: INR Lab Results  Component Value Date   NA 134*  12/12/2015   K 3.8 12/12/2015   CL 101 12/12/2015   CO2 25 12/12/2015   BUN 7 12/12/2015   CREATININE 0.73 12/12/2015   GLUCOSE 119* 12/12/2015    Discharge Medications:     Medication List    STOP taking these medications        aspirin 81 MG tablet      TAKE these medications        ADVAIR DISKUS 250-50 MCG/DOSE Aepb  Generic drug:  Fluticasone-Salmeterol  Inhale 1 puff into the lungs daily.     ALAWAY 0.025 % ophthalmic solution  Generic drug:  ketotifen  Place 2 drops into both eyes 2 (two) times daily.     albuterol (2.5 MG/3ML) 0.083% nebulizer solution  Commonly known as:  PROVENTIL  Take 2.5 mg by nebulization every 6 (six) hours as needed for wheezing or shortness of breath.     albuterol 90 MCG/ACT inhaler  Commonly known as:  PROVENTIL,VENTOLIN  Inhale 2 puffs into the lungs every 4 (four) hours  as needed for wheezing or shortness of breath.     azelastine 0.1 % nasal spray  Commonly known as:  ASTELIN  2 sprays by Nasal route 2 (two) times daily. Use in each nostril as directed     baclofen 10 MG tablet  Commonly known as:  LIORESAL  Take 1 tablet (10 mg total) by mouth 3 (three) times daily. As needed for muscle spasm     balsalazide 750 MG capsule  Commonly known as:  COLAZAL  Take 3 capsules (2,250 mg total) by mouth 2 (two) times daily.     buPROPion 150 MG 24 hr tablet  Commonly known as:  WELLBUTRIN XL  Take 150 mg by mouth daily.     busPIRone 15 MG tablet  Commonly known as:  BUSPAR  Take 7.5 mg by mouth 2 (two) times daily. 1/2 tablet bid     Calcium-Vitamin D 600-400 MG-UNIT Tabs  Take 1 tablet by mouth 2 (two) times daily.     cetirizine 10 MG tablet  Commonly known as:  ZYRTEC  Take 10 mg by mouth daily.     CITRUCEL oral powder  Generic drug:  methylcellulose  Take 1 packet by mouth 2 (two) times daily.     DULoxetine 60 MG capsule  Commonly known as:  CYMBALTA  Take 60 mg by mouth daily.     DULoxetine 30 MG capsule  Commonly known as:  CYMBALTA  Take 30 mg by mouth daily.     ferrous gluconate 225 (27 Fe) MG tablet  Commonly known as:  FERGON  Take 240 mg by mouth daily.     metFORMIN 500 MG 24 hr tablet  Commonly known as:  GLUCOPHAGE-XR  Take 1,000 mg by mouth every evening.     NEOSPORIN AF 2 % cream  Generic drug:  miconazole  Apply 1 application topically daily as needed (for wound care).     NEXIUM 40 MG capsule  Generic drug:  esomeprazole  TAKE 1 CAPSULE DAILY BEFORE BREAKFAST     ondansetron 4 MG tablet  Commonly known as:  ZOFRAN  Take 1 tablet (4 mg total) by mouth every 8 (eight) hours as needed for nausea or vomiting.     oxyCODONE-acetaminophen 10-325 MG tablet  Commonly known as:  PERCOCET  Take 1-2 tablets by mouth every 6 (six) hours as needed for pain. MAXIMUM TOTAL ACETAMINOPHEN DOSE IS 4000 MG PER DAY      PEPCID  COMPLETE 10-800-165 MG chewable tablet  Generic drug:  famotidine-calcium carbonate-magnesium hydroxide  Chew 1 tablet by mouth daily as needed (for breakthrough GERD).     pravastatin 20 MG tablet  Commonly known as:  PRAVACHOL  TAKE 1 TABLET EVERY EVENING     pregabalin 100 MG capsule  Commonly known as:  LYRICA  Take 100 mg by mouth 2 (two) times daily.     rivaroxaban 10 MG Tabs tablet  Commonly known as:  XARELTO  Take 1 tablet (10 mg total) by mouth daily.     sennosides-docusate sodium 8.6-50 MG tablet  Commonly known as:  SENOKOT-S  Take 2 tablets by mouth daily.     traMADol 100 MG 24 hr tablet  Commonly known as:  ULTRAM-ER  Take 100 mg by mouth at bedtime.     traZODone 100 MG tablet  Commonly known as:  DESYREL  Take 100 mg by mouth at bedtime.        Diagnostic Studies: Dg Knee Right Port  12/11/2015  CLINICAL DATA:  Status post right total knee replacement for osteoarthritis. EXAM: PORTABLE RIGHT KNEE - 1-2 VIEW COMPARISON:  Report from 12/16/2013 FINDINGS: Expected immediate postoperative appearance of the knee status post total knee arthroplasty, with gas and fluid in the suprapatellar bursa, joint, and subcutaneous tissues as is often encountered. This gas tracks cephalad along the vastus lateralis margin. No fracture or complicating feature is observed. IMPRESSION: 1. Total knee arthroplasty in place without complicating feature observed. Electronically Signed   By: Van Clines M.D.   On: 12/11/2015 15:37    Disposition: 01-Home or Self Care        Follow-up Information    Follow up with Johnny Bridge, MD. Schedule an appointment as soon as possible for a visit in 2 weeks.   Specialty:  Orthopedic Surgery   Contact information:   [place] [address] [phone]        Signed: Johnny Bridge 12/13/2015, 9:55 AM

## 2015-12-13 NOTE — Progress Notes (Addendum)
Physical Therapy Treatment Patient Details Name: [name] MRN: [id number] DOB: Oct 22, 1950 Today's Date: 12/13/2015    History of Present Illness Patient with complex medical history presents s/p R TKA.    PT Comments    Patient continues to struggle with pain control and mobility. Patient seen x2 today and both sessions, patient with difficulty tolerating WBing through affected extremity, this is a decline over initial two visits. Do not feel patient safe for d/c home at this time, will see x2 tomorrow to assist with mobility progression. Patient also expressing frustration and embarrassment over difficulties that she is having. Spoke with nsg tech regarding concerns expressed by patient EX:[id number] for assist from previous staff. Will continue to see and facilitate progression.   May need to consider w/c for safe mobility upon discharge and highly recommend HHPT vs outpatient as patient not safe for community mobility at this time.   Follow Up Recommendations  Home health PT;Supervision/Assistance - 24 hour     Equipment Recommendations  3in1 (PT);Wheelchair (measurements PT);Wheelchair cushion (measurements PT) (may need w/c if does not progress with ambulation)    Recommendations for Other Services       Precautions / Restrictions Precautions Precautions: Knee Precaution Booklet Issued: No Precaution Comments: Reviewed not placing pillow, ice pack or other object underneath R knee Restrictions Weight Bearing Restrictions: Yes RLE Weight Bearing: Weight bearing as tolerated    Mobility  Bed Mobility Overal bed mobility: Needs Assistance Bed Mobility: Sit to Supine       Sit to supine: Min assist   General bed mobility comments: Min assist to elevate RLE onto bed and to reposition self once supine in bed.   Transfers Overall transfer level: Needs assistance Equipment used: Rolling walker (2 wheeled) Transfers: Sit to/from Stand Sit to Stand: Min  assist         General transfer comment: Min assist for elevation to upright from bed and from commode with modified hand placement using one hand on surface and one hand on RW. Assist to stabilize RW upon standing.   Ambulation/Gait Ambulation/Gait assistance: Min guard;Min assist Ambulation Distance (Feet): 60 Feet Assistive device: Rolling walker (2 wheeled) Gait Pattern/deviations: Step-to pattern;Decreased stride length;Antalgic Gait velocity: decreased   General Gait Details: patient remains limited with LE pain, ambulated with KI in place for comfort.    Stairs            Wheelchair Mobility    Modified Rankin (Stroke Patients Only)       Balance Overall balance assessment: Needs assistance Sitting-balance support: No upper extremity supported;Feet supported Sitting balance-Leahy Scale: Fair     Standing balance support: Bilateral upper extremity supported Standing balance-Leahy Scale: Poor Standing balance comment: heavy reliance on UE support                    Cognition Arousal/Alertness: Awake/alert Behavior During Therapy: WFL for tasks assessed/performed Overall Cognitive Status: Within Functional Limits for tasks assessed                      Exercises      General Comments        Pertinent Vitals/Pain Pain Assessment: 0-10 Pain Score: 8  Pain Location: R knee Pain Descriptors / Indicators: Aching;Crying;Sore Pain Intervention(s): Limited activity within patient's tolerance;Monitored during session;Repositioned;Ice applied;RN gave pain meds during session    [place] expects to be discharged to:: Private residence Living Arrangements: Spouse/significant other  Prior Function            PT Goals (current goals can now be found in the care plan section) Acute Rehab PT Goals Patient Stated Goal: to go home PT Goal Formulation: With patient Time For Goal Achievement:  12/26/15 Progress towards PT goals: Progressing toward goals (modest)    Frequency  7X/week    PT Plan Current plan remains appropriate    Co-evaluation             End of Session Equipment Utilized During Treatment: Gait belt Activity Tolerance: Patient limited by fatigue;Patient limited by pain Patient left: in bed;with call bell/phone within reach     Time: 7943-2761 PT Time Calculation (min) (ACUTE ONLY): 21 min  Charges:  $Gait Training: 8-22 mins                    G CodesDuncan Dull 2016/01/07, 5:13 PM Alben Deeds, [place] DPT  [phone]

## 2015-12-13 NOTE — Progress Notes (Signed)
     Subjective:  Patient reports pain as moderate.  She is feeling poorly and does not feel ready to go home. Significant pain and difficulty with mobility.  Objective:   VITALS:   Filed Vitals:   12/13/15 0349 12/13/15 0458 12/13/15 0911 12/13/15 0914  BP: 146/71 123/71    Pulse: 103 97    Temp: 99.2 F (37.3 C) 99.7 F (37.6 C)    TempSrc: Oral Oral    Resp: 16 16    Weight:      SpO2: 93% 90% 79% 95%    Neurologically intact She is up walking with physical therapy to the bathroom, making slow and steady progress. Dorsiflexion of the ankle appears intact during gait. Her dressings are clean and dry.  Lab Results  Component Value Date   WBC 8.5 12/13/2015   HGB 9.8* 12/13/2015   HCT 30.8* 12/13/2015   MCV 93.9 12/13/2015   PLT 220 12/13/2015   BMET    Component Value Date/Time   NA 134* 12/12/2015 0646   NA 137 02/07/2012 1301   K 3.8 12/12/2015 0646   K 4.5 02/07/2012 1301   CL 101 12/12/2015 0646   CL 100 02/07/2012 1301   CO2 25 12/12/2015 0646   GLUCOSE 119* 12/12/2015 0646   BUN 7 12/12/2015 0646   BUN 12 02/07/2012 1301   CREATININE 0.73 12/12/2015 0646   CREATININE 0.82 02/07/2012 1301   CALCIUM 8.5* 12/12/2015 0646   CALCIUM 9.5 02/07/2012 1301   GFRNONAA >60 12/12/2015 0646   GFRAA >60 12/12/2015 0646     Assessment/Plan: 2 Days Post-Op   Principal Problem:   Primary localized osteoarthritis of right knee Active Problems:   Status post total right knee replacement   Advance diet Up with therapy Plan for discharge tomorrow Discharge home with home health    [place] 12/13/2015, 9:54 AM   Marchia Bond, MD Cell [phone]

## 2015-12-14 LAB — CBC
HEMATOCRIT: 29.3 % — AB (ref 36.0–46.0)
HEMOGLOBIN: 9.2 g/dL — AB (ref 12.0–15.0)
MCH: 29.5 pg (ref 26.0–34.0)
MCHC: 31.4 g/dL (ref 30.0–36.0)
MCV: 93.9 fL (ref 78.0–100.0)
Platelets: 224 10*3/uL (ref 150–400)
RBC: 3.12 MIL/uL — AB (ref 3.87–5.11)
RDW: 15 % (ref 11.5–15.5)
WBC: 6.8 10*3/uL (ref 4.0–10.5)

## 2015-12-14 LAB — GLUCOSE, CAPILLARY
Glucose-Capillary: 118 mg/dL — ABNORMAL HIGH (ref 65–99)
Glucose-Capillary: 113 mg/dL — ABNORMAL HIGH (ref 65–99)

## 2015-12-14 NOTE — Progress Notes (Signed)
Physical Therapy Treatment Patient Details Name: [name] MRN: [id number] DOB: Jul 01, 1951 Today's Date: 12/14/2015    History of Present Illness Patient with complex medical history presents s/p R TKA.    PT Comments    Patient seen this am for initial session. Patient remains limited by pain but was able to tolerate increased activity with assist. At this time, remain concerned for patients ability to mobilize safely. May need to consider RW and highly recommend HHPT instead of outpatient PT to ensure compliance and safety with mobility as patient is a high fall risk and has very limited mobility overall.  Will see again this afternoon.   Follow Up Recommendations  Home health PT;Supervision/Assistance - 24 hour     Equipment Recommendations  3in1 (PT);Wheelchair (measurements PT);Wheelchair cushion (measurements PT) (may need w/c if does not progress with ambulation)    Recommendations for Other Services       Precautions / Restrictions Precautions Precautions: Knee Precaution Booklet Issued: No Precaution Comments: Reviewed not placing pillow, ice pack or other object underneath R knee Restrictions Weight Bearing Restrictions: Yes RLE Weight Bearing: Weight bearing as tolerated    Mobility  Bed Mobility               General bed mobility comments: received in chair  Transfers Overall transfer level: Needs assistance Equipment used: Rolling walker (2 wheeled) Transfers: Sit to/from Stand Sit to Stand: Min assist         General transfer comment: Min assist for elevation to upright from bed and from commode with modified hand placement using one hand on surface and one hand on RW. Assist to stabilize RW upon standing.  (RUE on bed surface, LUE on RW with assist to stabilize)  Ambulation/Gait Ambulation/Gait assistance: Min guard;Min assist Ambulation Distance (Feet): 100 Feet Assistive device: Rolling walker (2 wheeled) Gait Pattern/deviations:  Step-to pattern;Decreased stride length;Antalgic Gait velocity: decreased Gait velocity interpretation: Below normal speed for age/gender General Gait Details: Patient remains limited with overall ability to mobilize, tolerated increased distance today but with significant time and in bouts of 10-15 ft max at a time   Stairs            Wheelchair Mobility    Modified Rankin (Stroke Patients Only)       Balance   Sitting-balance support: No upper extremity supported Sitting balance-Leahy Scale: Fair     Standing balance support: Bilateral upper extremity supported Standing balance-Leahy Scale: Poor Standing balance comment: remains reliant on RW for UE support, cues for safe positioning in static standing                    Cognition Arousal/Alertness: Awake/alert Behavior During Therapy: WFL for tasks assessed/performed Overall Cognitive Status: Within Functional Limits for tasks assessed                      Exercises Total Joint Exercises Ankle Circles/Pumps: AROM;20 reps Quad Sets: AROM;5 reps Heel Slides: PROM;20 reps Knee Flexion: AROM;5 reps    General Comments General comments (skin integrity, edema, etc.): Once again performed PROM exercises to improve mobility of LE, increased edema noted      Pertinent Vitals/Pain Pain Assessment: 0-10 Pain Score: 7  Pain Location: right knee Pain Descriptors / Indicators: Aching;Burning;Operative site guarding Pain Intervention(s): Limited activity within patient's tolerance;Monitored during session;Repositioned;Ice applied;RN gave pain meds during session    Home Living  Prior Function            PT Goals (current goals can now be found in the care plan section) Acute Rehab PT Goals Patient Stated Goal: to go home PT Goal Formulation: With patient Time For Goal Achievement: 12/26/15 Progress towards PT goals: Progressing toward goals (modestly )    Frequency   7X/week    PT Plan Current plan remains appropriate    Co-evaluation             End of Session Equipment Utilized During Treatment: Gait belt Activity Tolerance: Patient limited by pain Patient left: in chair;with call bell/phone within reach;with family/visitor present     Time: 4883-0141 PT Time Calculation (min) (ACUTE ONLY): 28 min  Charges:  $Gait Training: 8-22 mins $Therapeutic Activity: 8-22 mins                    G CodesDuncan Dull 12/25/2015, 10:37 AM Alben Deeds, PT DPT  [phone]

## 2015-12-14 NOTE — Progress Notes (Signed)
Occupational Therapy Treatment/Discharge Patient Details Name: [name] MRN: [id number] DOB: 08-Feb-1951 Today's Date: 12/14/2015    History of present illness Patient with complex medical history presents s/p R TKA. (Simultaneous filing. User may not have seen previous data.)   OT comments  Pt agreeable to practice tub transfer with significant encouragement to participate from PT due to pt declining [facility] services and being d/c'ed today. Pt completed transfer with shower seat and required min assist. Pt and pt's husband ultimately decided tub bench would be safest option for pt to complete tub transfer. Demonstrated use of tub bench and pt/husband verbalized understanding. All education has been completed and pt has no further questions. Pt with no further acute OT needs. OT signing off.   Follow Up Recommendations  Home health OT;Supervision/Assistance - 24 hour    Equipment Recommendations  3 in 1 bedside comode;Tub/shower bench    Recommendations for Other Services      Precautions / Restrictions Precautions Precautions: Knee (Simultaneous filing. User may not have seen previous data.) Precaution Booklet Issued: No (Simultaneous filing. User may not have seen previous data.) Precaution Comments: Reviewed not placing pillow, ice pack or other object underneath R knee (Simultaneous filing. User may not have seen previous data.) Restrictions Weight Bearing Restrictions: Yes (Simultaneous filing. User may not have seen previous data.) RLE Weight Bearing: Weight bearing as tolerated (Simultaneous filing. User may not have seen previous data.)       Mobility Bed Mobility Overal bed mobility: Needs Assistance Bed Mobility: Sit to Supine     Supine to sit: Supervision     General bed mobility comments: Pt up with PT  Transfers Overall transfer level: Needs assistance (Simultaneous filing. User may not have seen previous data.) Equipment used: Rolling walker (2 wheeled)  (Simultaneous filing. User may not have seen previous data.) Transfers: Sit to/from Stand (Simultaneous filing. User may not have seen previous data.) Sit to Stand: Min assist (Simultaneous filing. User may not have seen previous data.)         General transfer comment: Min assist from shower seat for safety and balance. Verbal cues for safe hand placement (Simultaneous filing. User may not have seen previous data.)    Balance Overall balance assessment: Needs assistance Sitting-balance support: No upper extremity supported;Feet supported (Simultaneous filing. User may not have seen previous data.) Sitting balance-Leahy Scale: Fair (Simultaneous filing. User may not have seen previous data.)     Standing balance support: Bilateral upper extremity supported;During functional activity (Simultaneous filing. User may not have seen previous data.) Standing balance-Leahy Scale: Poor (Simultaneous filing. User may not have seen previous data.) Standing balance comment: remains reliant on RW for UE support                    ADL Overall ADL's : Needs assistance/impaired                 Upper Body Dressing : Set up;Sitting   Lower Body Dressing: Minimal assistance;Sit to/from stand;Cueing for compensatory techniques Lower Body Dressing Details (indicate cue type and reason): Cues to dress RLE first and undress it last         Tub/ Shower Transfer: Tub transfer;Minimal assistance;Cueing for safety;Cueing for sequencing;Ambulation;Shower Technical sales engineer Details (indicate cue type and reason): Practiced transfer with shower seat and ultimately decided pt would be safer using tub bench - pt's husband will purchase Functional mobility during ADLs: Minimal assistance;Rolling walker General ADL Comments: Pt agreeable to practice tub  transfer with max verbal cues for encouragement and participation by PT      Vision                     Perception      Praxis      Cognition   Behavior During Therapy: [facility] for tasks assessed/performed (Simultaneous filing. User may not have seen previous data.) Overall Cognitive Status: Within Functional Limits for tasks assessed (Simultaneous filing. User may not have seen previous data.)                       Extremity/Trunk Assessment               Exercises     Shoulder Instructions       General Comments      Pertinent Vitals/ Pain       Pain Assessment: 0-10 (Simultaneous filing. User may not have seen previous data.) Pain Score: 4  (Simultaneous filing. User may not have seen previous data.) Pain Location: R knee (Simultaneous filing. User may not have seen previous data.) Pain Descriptors / Indicators: Aching;Discomfort;Spasm (Simultaneous filing. User may not have seen previous data.) Pain Intervention(s): Limited activity within patient's tolerance;Monitored during session;Repositioned (Simultaneous filing. User may not have seen previous data.)  Home Living                                          Prior Functioning/Environment              Frequency Min 2X/week     Progress Toward Goals  OT Goals(current goals can now be found in the care plan section)  Progress towards OT goals: Goals met/education completed, patient discharged from OT  Acute Rehab OT Goals Patient Stated Goal: to go home (Simultaneous filing. User may not have seen previous data.) OT Goal Formulation: With patient Time For Goal Achievement: 12/26/15 Potential to Achieve Goals: Good ADL Goals Pt Will Perform Lower Body Bathing: sit to/from stand;with supervision Pt Will Perform Lower Body Dressing: with supervision;sit to/from stand Pt Will Transfer to Toilet: with supervision;ambulating;bedside commode Pt Will Perform Toileting - Clothing Manipulation and hygiene: with supervision;sit to/from stand Pt Will Perform Tub/Shower Transfer: Tub transfer;with  supervision;ambulating;3 in 1;rolling walker  Plan All goals met and education completed, patient discharged from OT services    Co-evaluation                 End of Session Equipment Utilized During Treatment: Gait belt;Rolling walker   Activity Tolerance Patient tolerated treatment well   Patient Left in chair;with call bell/phone within reach;with family/visitor present   Nurse Communication Mobility status        Time: 1340-1350 OT Time Calculation (min): 10 min  Charges: OT General Charges $OT Visit: 1 Procedure OT Treatments $Self Care/Home Management : 8-22 mins  Redmond Baseman, OTR/L Pager: [phone] 12/14/2015, 5:27 PM

## 2015-12-14 NOTE — Progress Notes (Signed)
Physical Therapy Treatment Patient Details Name: [name] MRN: [id number] DOB: 1951-04-06 Today's Date: 12/14/2015    History of Present Illness Patient with complex medical history presents s/p R TKA.    PT Comments    Patient seen for follow up session in preparation for d/c home. Patient still with difficulty tolerating activity requires max encouragement and increased assist. Increased time and effort required for all aspects of mobility. At this time, highly recommend HHPT services for patient, spoke with patient who initially stated that would be ok, but then declined because she did not want people in her trailer. Spoke with patient regarding concerns for safety and increased fall risk. Also discussed importance of HEP and therapies for optimal recovery. Encourage hands on assist for all levels of care at this time. Spouse (Lonnie) present and states that he understands.     Follow Up Recommendations  Home health PT;Supervision/Assistance - 24 hour     Equipment Recommendations  3in1 (PT);Wheelchair (measurements PT);Wheelchair cushion (measurements PT) (may need w/c if does not progress with ambulation)    Recommendations for Other Services       Precautions / Restrictions Precautions Precautions: Knee Precaution Booklet Issued: No Precaution Comments: Reviewed not placing pillow, ice pack or other object underneath R knee Restrictions Weight Bearing Restrictions: Yes RLE Weight Bearing: Weight bearing as tolerated    Mobility  Bed Mobility Overal bed mobility: Needs Assistance Bed Mobility: Sit to Supine     Supine to sit: Supervision     General bed mobility comments: Pt up with PT  Transfers Overall transfer level: Needs assistance Equipment used: Rolling walker (2 wheeled) Transfers: Sit to/from Stand Sit to Stand: Min assist         General transfer comment: performed increased   Ambulation/Gait Ambulation/Gait assistance: Min  guard Ambulation Distance (Feet): 140 Feet Assistive device: Rolling walker (2 wheeled) Gait Pattern/deviations: Step-to pattern;Decreased stride length;Antalgic Gait velocity: decreased Gait velocity interpretation: Below normal speed for age/gender General Gait Details: Patient remains limited with overall ability to mobilize, tolerated increased distance today but with significant time and in bouts of 10-15 ft max at a time   Stairs Stairs: Yes   Stair Management: Step to pattern;With walker   General stair comments: educate dpatient and spouse regarding technique for curb negotiation and step negotation to get into home.  Wheelchair Mobility    Modified Rankin (Stroke Patients Only)       Balance Overall balance assessment: Needs assistance Sitting-balance support: No upper extremity supported Sitting balance-Leahy Scale: Fair     Standing balance support: Bilateral upper extremity supported Standing balance-Leahy Scale: Poor Standing balance comment: remains reliant on RW for UE support                     Cognition Arousal/Alertness: Awake/alert Behavior During Therapy: WFL for tasks assessed/performed Overall Cognitive Status: Within Functional Limits for tasks assessed                      Exercises      General Comments        Pertinent Vitals/Pain Pain Assessment: 0-10 Pain Score: 4  Pain Location: right knee Pain Descriptors / Indicators: Spasm Pain Intervention(s): Limited activity within patient's tolerance;Monitored during session;Repositioned    Home Living                      Prior Function  PT Goals (current goals can now be found in the care plan section) Acute Rehab PT Goals Patient Stated Goal: to go home PT Goal Formulation: With patient Time For Goal Achievement: 12/26/15 Progress towards PT goals: Progressing toward goals (modestly)    Frequency  7X/week    PT Plan Current plan remains  appropriate    Co-evaluation             End of Session Equipment Utilized During Treatment: Gait belt Activity Tolerance: Patient limited by pain Patient left: in chair;with call bell/phone within reach;with family/visitor present     Time: 9390-3009 PT Time Calculation (min) (ACUTE ONLY): 51 min  Charges:  $Gait Training: 8-22 mins $Self Care/Home Management: 8-22                    G CodesDuncan Dull 12-25-15, 5:31 PM Alben Deeds, [place] DPT  [phone]

## 2015-12-14 NOTE — Progress Notes (Signed)
Patient ID: [name], female   DOB: 1951-05-20, 65 y.o.   MRN: [id number]     Subjective:  Patient reports pain as mild.  Patient in the restroom and getting up on her own.  She denies any CP or SOB.  In no acute distress  Objective:   VITALS:   Filed Vitals:   12/13/15 1313 12/13/15 1500 12/13/15 1950 12/14/15 0508  BP: 107/55  137/71 117/56  Pulse: 94  95 91  Temp: 98.2 F (36.8 C)  99.3 F (37.4 C) 98.3 F (36.8 C)  TempSrc: Oral  Oral Oral  Resp: 18  18 19   Height:  5' 4"  (1.626 m)    Weight:      SpO2: 94%  99% 97%    ABD soft Sensation intact distally Dorsiflexion/Plantar flexion intact Incision: dressing C/D/I and no drainage Good foot and ankle motion  Lab Results  Component Value Date   WBC 6.8 12/14/2015   HGB 9.2* 12/14/2015   HCT 29.3* 12/14/2015   MCV 93.9 12/14/2015   PLT 224 12/14/2015   BMET    Component Value Date/Time   NA 134* 12/12/2015 0646   NA 137 02/07/2012 1301   K 3.8 12/12/2015 0646   K 4.5 02/07/2012 1301   CL 101 12/12/2015 0646   CL 100 02/07/2012 1301   CO2 25 12/12/2015 0646   GLUCOSE 119* 12/12/2015 0646   BUN 7 12/12/2015 0646   BUN 12 02/07/2012 1301   CREATININE 0.73 12/12/2015 0646   CREATININE 0.82 02/07/2012 1301   CALCIUM 8.5* 12/12/2015 0646   CALCIUM 9.5 02/07/2012 1301   GFRNONAA >60 12/12/2015 0646   GFRAA >60 12/12/2015 0646     Assessment/Plan: 3 Days Post-Op   Principal Problem:   Primary localized osteoarthritis of right knee Active Problems:   Status post total right knee replacement   Advance diet Up with therapy Discharge home with home health WBAT Dry dressing PRN Follow up with Dr Mardelle Matte in 2 weeks   DOUGLAS PARRY, Erlene Quan 12/14/2015, 1:30 PM  AGREE WITH ABOVE.    Marchia Bond, MD Cell [phone]

## 2015-12-14 NOTE — Care Management Important Message (Signed)
Important Message  Patient Details  Name: [name] MRN: [id number] Date of Birth: [birth year]   Medicare Important Message Given:  Yes    [name] P [name] 12/14/2015, 3:02 PM

## 2015-12-14 NOTE — Anesthesia Postprocedure Evaluation (Signed)
Anesthesia Post Note  Patient: [name]  Procedure(s) Performed: Procedure(s) (LRB): TOTAL KNEE ARTHROPLASTY (Right)  Patient location during evaluation: PACU Anesthesia Type: Spinal Level of consciousness: oriented and awake and alert Pain management: pain level controlled Vital Signs Assessment: post-procedure vital signs reviewed and stable Respiratory status: spontaneous breathing, respiratory function stable and patient connected to nasal cannula oxygen Cardiovascular status: blood pressure returned to baseline and stable Postop Assessment: no headache and no backache Anesthetic complications: no                  Zenaida Deed

## 2015-12-14 NOTE — Progress Notes (Signed)
Occupational Therapy Treatment Patient Details Name: [name] MRN: [id number] DOB: 1951-01-06 Today's Date: 12/14/2015    History of present illness Patient with complex medical history presents s/p R TKA.   OT comments  Pt declined to mobilize OOB with OT this session to practice tub transfer. Pt completed LB ADLs by long-sitting in bed with set up assist from her husband. Discussed options and demonstrated technique for tub transfers using 3in1 and shower seat, but pt continued to decline need to practice despite discussion about the importance of determining safest technique for pt and pt's husband to complete at home. Educated pt on energy conservation and fall prevention strategies. Will continue to follow acutely.    Follow Up Recommendations  Home health OT;Supervision/Assistance - 24 hour    Equipment Recommendations  3 in 1 bedside comode    Recommendations for Other Services      Precautions / Restrictions Precautions Precautions: Knee Precaution Booklet Issued: No Precaution Comments: Reviewed not placing pillow, ice pack or other object underneath R knee Restrictions Weight Bearing Restrictions: Yes RLE Weight Bearing: Weight bearing as tolerated       Mobility Bed Mobility Overal bed mobility: Needs Assistance Bed Mobility: Sit to Supine     Supine to sit: Supervision     General bed mobility comments: HOB flat, no use of bedrails to simulate home environment. Pt progressed to long-sitting in the bed without physical assistance.  Transfers                 General transfer comment: No transfers attempted at this time due to pt declining.    Balance                                   ADL Overall ADL's : Needs assistance/impaired                 Upper Body Dressing : Set up;Sitting   Lower Body Dressing: Minimal assistance;Sit to/from stand;Cueing for compensatory techniques Lower Body Dressing Details (indicate cue  type and reason): Cues to dress RLE first and undress it last           Tub/Shower Transfer Details (indicate cue type and reason): Discussed and demonstrated 2 options for pt to complete tub transfer. 1 with 3in1 and 1 with shower stool. Pt declined to practice due to being fatigued and about to d/c. Discussed importance of practicing these transfers to determine safest option for home use, but pt continued to decline.   General ADL Comments: Pt declined to mobilize OOB at this time due to fatigue and being d/c'ed. Discussed compensatory strategies for ADLs, energy conservation, and fall prevention strategies with pt and husband.       Vision                     Perception     Praxis      Cognition   Behavior During Therapy: [facility] for tasks assessed/performed Overall Cognitive Status: Within Functional Limits for tasks assessed                       Extremity/Trunk Assessment               Exercises     Shoulder Instructions       General Comments      Pertinent Vitals/ Pain       Pain Assessment:  0-10 Pain Score: 4  Pain Location: R knee Pain Descriptors / Indicators: Spasm Pain Intervention(s): Limited activity within patient's tolerance;Monitored during session;Repositioned  Home Living                                          Prior Functioning/Environment              Frequency Min 2X/week     Progress Toward Goals  OT Goals(current goals can now be found in the care plan section)  Progress towards OT goals: Progressing toward goals  Acute Rehab OT Goals Patient Stated Goal: to go home OT Goal Formulation: With patient Time For Goal Achievement: 12/26/15 Potential to Achieve Goals: Good ADL Goals Pt Will Perform Lower Body Bathing: sit to/from stand;with supervision Pt Will Perform Lower Body Dressing: with supervision;sit to/from stand Pt Will Transfer to Toilet: with supervision;ambulating;bedside  commode Pt Will Perform Toileting - Clothing Manipulation and hygiene: with supervision;sit to/from stand Pt Will Perform Tub/Shower Transfer: Tub transfer;with supervision;ambulating;3 in 1;rolling walker  Plan Discharge plan remains appropriate    Co-evaluation                 End of Session     Activity Tolerance Patient limited by fatigue   Patient Left in bed;with call bell/phone within reach;with family/visitor present   Nurse Communication Mobility status        Time: 1410-1420 OT Time Calculation (min): 10 min  Charges: OT General Charges $OT Visit: 1 Procedure OT Treatments $Self Care/Home Management : 8-22 mins  Redmond Baseman, OTR/L Pager: [phone] 12/14/2015, 2:36 PM

## 2015-12-16 DIAGNOSIS — K5180 Other ulcerative colitis without complications: Secondary | ICD-10-CM | POA: Diagnosis not present

## 2015-12-16 DIAGNOSIS — Z888 Allergy status to other drugs, medicaments and biological substances status: Secondary | ICD-10-CM | POA: Diagnosis not present

## 2015-12-16 DIAGNOSIS — Z7982 Long term (current) use of aspirin: Secondary | ICD-10-CM | POA: Diagnosis not present

## 2015-12-16 DIAGNOSIS — F339 Major depressive disorder, recurrent, unspecified: Secondary | ICD-10-CM | POA: Diagnosis not present

## 2015-12-16 DIAGNOSIS — Z96651 Presence of right artificial knee joint: Secondary | ICD-10-CM | POA: Diagnosis present

## 2015-12-16 DIAGNOSIS — R1312 Dysphagia, oropharyngeal phase: Secondary | ICD-10-CM | POA: Diagnosis not present

## 2015-12-16 DIAGNOSIS — J441 Chronic obstructive pulmonary disease with (acute) exacerbation: Secondary | ICD-10-CM | POA: Diagnosis not present

## 2015-12-16 DIAGNOSIS — T428X5A Adverse effect of antiparkinsonism drugs and other central muscle-tone depressants, initial encounter: Secondary | ICD-10-CM | POA: Diagnosis present

## 2015-12-16 DIAGNOSIS — J9602 Acute respiratory failure with hypercapnia: Secondary | ICD-10-CM | POA: Diagnosis not present

## 2015-12-16 DIAGNOSIS — R4182 Altered mental status, unspecified: Secondary | ICD-10-CM | POA: Diagnosis not present

## 2015-12-16 DIAGNOSIS — E119 Type 2 diabetes mellitus without complications: Secondary | ICD-10-CM | POA: Diagnosis not present

## 2015-12-16 DIAGNOSIS — Z881 Allergy status to other antibiotic agents status: Secondary | ICD-10-CM | POA: Diagnosis not present

## 2015-12-16 DIAGNOSIS — J9601 Acute respiratory failure with hypoxia: Secondary | ICD-10-CM | POA: Diagnosis not present

## 2015-12-16 DIAGNOSIS — Z79899 Other long term (current) drug therapy: Secondary | ICD-10-CM | POA: Diagnosis not present

## 2015-12-16 DIAGNOSIS — F329 Major depressive disorder, single episode, unspecified: Secondary | ICD-10-CM | POA: Diagnosis present

## 2015-12-16 DIAGNOSIS — R410 Disorientation, unspecified: Secondary | ICD-10-CM | POA: Diagnosis not present

## 2015-12-16 DIAGNOSIS — F419 Anxiety disorder, unspecified: Secondary | ICD-10-CM | POA: Diagnosis present

## 2015-12-16 DIAGNOSIS — M25561 Pain in right knee: Secondary | ICD-10-CM | POA: Diagnosis not present

## 2015-12-16 DIAGNOSIS — R279 Unspecified lack of coordination: Secondary | ICD-10-CM | POA: Diagnosis not present

## 2015-12-16 DIAGNOSIS — J95821 Acute postprocedural respiratory failure: Secondary | ICD-10-CM | POA: Diagnosis not present

## 2015-12-16 DIAGNOSIS — Z880 Allergy status to penicillin: Secondary | ICD-10-CM | POA: Diagnosis not present

## 2015-12-16 DIAGNOSIS — M170 Bilateral primary osteoarthritis of knee: Secondary | ICD-10-CM | POA: Diagnosis present

## 2015-12-16 DIAGNOSIS — G4733 Obstructive sleep apnea (adult) (pediatric): Secondary | ICD-10-CM | POA: Diagnosis present

## 2015-12-16 DIAGNOSIS — R2689 Other abnormalities of gait and mobility: Secondary | ICD-10-CM | POA: Diagnosis not present

## 2015-12-16 DIAGNOSIS — J449 Chronic obstructive pulmonary disease, unspecified: Secondary | ICD-10-CM | POA: Diagnosis present

## 2015-12-16 DIAGNOSIS — R0902 Hypoxemia: Secondary | ICD-10-CM | POA: Diagnosis not present

## 2015-12-16 DIAGNOSIS — Z7901 Long term (current) use of anticoagulants: Secondary | ICD-10-CM | POA: Diagnosis not present

## 2015-12-16 DIAGNOSIS — R531 Weakness: Secondary | ICD-10-CM | POA: Diagnosis not present

## 2015-12-16 DIAGNOSIS — K449 Diaphragmatic hernia without obstruction or gangrene: Secondary | ICD-10-CM | POA: Diagnosis present

## 2015-12-16 DIAGNOSIS — G4700 Insomnia, unspecified: Secondary | ICD-10-CM | POA: Diagnosis not present

## 2015-12-16 DIAGNOSIS — Z885 Allergy status to narcotic agent status: Secondary | ICD-10-CM | POA: Diagnosis not present

## 2015-12-16 DIAGNOSIS — D508 Other iron deficiency anemias: Secondary | ICD-10-CM | POA: Diagnosis not present

## 2015-12-16 DIAGNOSIS — Z886 Allergy status to analgesic agent status: Secondary | ICD-10-CM | POA: Diagnosis not present

## 2015-12-16 DIAGNOSIS — K5190 Ulcerative colitis, unspecified, without complications: Secondary | ICD-10-CM | POA: Diagnosis not present

## 2015-12-16 DIAGNOSIS — E7800 Pure hypercholesterolemia, unspecified: Secondary | ICD-10-CM | POA: Diagnosis not present

## 2015-12-16 DIAGNOSIS — M6281 Muscle weakness (generalized): Secondary | ICD-10-CM | POA: Diagnosis not present

## 2015-12-16 DIAGNOSIS — I2699 Other pulmonary embolism without acute cor pulmonale: Secondary | ICD-10-CM | POA: Diagnosis not present

## 2015-12-16 DIAGNOSIS — D649 Anemia, unspecified: Secondary | ICD-10-CM | POA: Diagnosis not present

## 2015-12-16 DIAGNOSIS — Z87891 Personal history of nicotine dependence: Secondary | ICD-10-CM | POA: Diagnosis not present

## 2015-12-16 DIAGNOSIS — T81718A Complication of other artery following a procedure, not elsewhere classified, initial encounter: Secondary | ICD-10-CM | POA: Diagnosis not present

## 2015-12-16 DIAGNOSIS — Z79891 Long term (current) use of opiate analgesic: Secondary | ICD-10-CM | POA: Diagnosis not present

## 2015-12-16 DIAGNOSIS — Z7984 Long term (current) use of oral hypoglycemic drugs: Secondary | ICD-10-CM | POA: Diagnosis not present

## 2015-12-16 DIAGNOSIS — J9622 Acute and chronic respiratory failure with hypercapnia: Secondary | ICD-10-CM | POA: Diagnosis not present

## 2015-12-16 DIAGNOSIS — R52 Pain, unspecified: Secondary | ICD-10-CM | POA: Diagnosis not present

## 2015-12-16 DIAGNOSIS — E1140 Type 2 diabetes mellitus with diabetic neuropathy, unspecified: Secondary | ICD-10-CM | POA: Diagnosis not present

## 2015-12-19 ENCOUNTER — Ambulatory Visit: Payer: Medicare Other | Admitting: Gastroenterology

## 2015-12-20 DIAGNOSIS — R2689 Other abnormalities of gait and mobility: Secondary | ICD-10-CM | POA: Diagnosis not present

## 2015-12-20 DIAGNOSIS — E119 Type 2 diabetes mellitus without complications: Secondary | ICD-10-CM | POA: Diagnosis not present

## 2015-12-20 DIAGNOSIS — J95821 Acute postprocedural respiratory failure: Secondary | ICD-10-CM | POA: Diagnosis not present

## 2015-12-20 DIAGNOSIS — G4700 Insomnia, unspecified: Secondary | ICD-10-CM | POA: Diagnosis not present

## 2015-12-20 DIAGNOSIS — G4733 Obstructive sleep apnea (adult) (pediatric): Secondary | ICD-10-CM | POA: Diagnosis not present

## 2015-12-20 DIAGNOSIS — R531 Weakness: Secondary | ICD-10-CM | POA: Diagnosis not present

## 2015-12-20 DIAGNOSIS — R410 Disorientation, unspecified: Secondary | ICD-10-CM | POA: Diagnosis not present

## 2015-12-20 DIAGNOSIS — K5180 Other ulcerative colitis without complications: Secondary | ICD-10-CM | POA: Diagnosis not present

## 2015-12-20 DIAGNOSIS — I2699 Other pulmonary embolism without acute cor pulmonale: Secondary | ICD-10-CM | POA: Diagnosis not present

## 2015-12-20 DIAGNOSIS — E1140 Type 2 diabetes mellitus with diabetic neuropathy, unspecified: Secondary | ICD-10-CM | POA: Diagnosis not present

## 2015-12-20 DIAGNOSIS — D508 Other iron deficiency anemias: Secondary | ICD-10-CM | POA: Diagnosis not present

## 2015-12-20 DIAGNOSIS — K5190 Ulcerative colitis, unspecified, without complications: Secondary | ICD-10-CM | POA: Diagnosis not present

## 2015-12-20 DIAGNOSIS — R2241 Localized swelling, mass and lump, right lower limb: Secondary | ICD-10-CM | POA: Diagnosis not present

## 2015-12-20 DIAGNOSIS — J441 Chronic obstructive pulmonary disease with (acute) exacerbation: Secondary | ICD-10-CM | POA: Diagnosis not present

## 2015-12-20 DIAGNOSIS — R279 Unspecified lack of coordination: Secondary | ICD-10-CM | POA: Diagnosis not present

## 2015-12-20 DIAGNOSIS — M6281 Muscle weakness (generalized): Secondary | ICD-10-CM | POA: Diagnosis not present

## 2015-12-20 DIAGNOSIS — F419 Anxiety disorder, unspecified: Secondary | ICD-10-CM | POA: Diagnosis not present

## 2015-12-20 DIAGNOSIS — F339 Major depressive disorder, recurrent, unspecified: Secondary | ICD-10-CM | POA: Diagnosis not present

## 2015-12-20 DIAGNOSIS — D649 Anemia, unspecified: Secondary | ICD-10-CM | POA: Diagnosis not present

## 2015-12-20 DIAGNOSIS — E7800 Pure hypercholesterolemia, unspecified: Secondary | ICD-10-CM | POA: Diagnosis not present

## 2015-12-20 DIAGNOSIS — R1312 Dysphagia, oropharyngeal phase: Secondary | ICD-10-CM | POA: Diagnosis not present

## 2015-12-20 DIAGNOSIS — M170 Bilateral primary osteoarthritis of knee: Secondary | ICD-10-CM | POA: Diagnosis not present

## 2016-01-02 DIAGNOSIS — Z96651 Presence of right artificial knee joint: Secondary | ICD-10-CM | POA: Diagnosis not present

## 2016-01-09 DIAGNOSIS — M25561 Pain in right knee: Secondary | ICD-10-CM | POA: Diagnosis not present

## 2016-01-09 DIAGNOSIS — R262 Difficulty in walking, not elsewhere classified: Secondary | ICD-10-CM | POA: Diagnosis not present

## 2016-01-10 DIAGNOSIS — M25561 Pain in right knee: Secondary | ICD-10-CM | POA: Diagnosis not present

## 2016-01-10 DIAGNOSIS — R262 Difficulty in walking, not elsewhere classified: Secondary | ICD-10-CM | POA: Diagnosis not present

## 2016-01-14 DIAGNOSIS — M25561 Pain in right knee: Secondary | ICD-10-CM | POA: Diagnosis not present

## 2016-01-14 DIAGNOSIS — R262 Difficulty in walking, not elsewhere classified: Secondary | ICD-10-CM | POA: Diagnosis not present

## 2016-01-16 DIAGNOSIS — Z96651 Presence of right artificial knee joint: Secondary | ICD-10-CM | POA: Diagnosis not present

## 2016-01-17 DIAGNOSIS — M25561 Pain in right knee: Secondary | ICD-10-CM | POA: Diagnosis not present

## 2016-01-17 DIAGNOSIS — R262 Difficulty in walking, not elsewhere classified: Secondary | ICD-10-CM | POA: Diagnosis not present

## 2016-01-21 DIAGNOSIS — R262 Difficulty in walking, not elsewhere classified: Secondary | ICD-10-CM | POA: Diagnosis not present

## 2016-01-21 DIAGNOSIS — M25561 Pain in right knee: Secondary | ICD-10-CM | POA: Diagnosis not present

## 2016-01-22 ENCOUNTER — Ambulatory Visit (INDEPENDENT_AMBULATORY_CARE_PROVIDER_SITE_OTHER): Payer: Medicare Other | Admitting: Internal Medicine

## 2016-01-22 ENCOUNTER — Encounter: Payer: Self-pay | Admitting: Internal Medicine

## 2016-01-22 VITALS — BP 138/82 | HR 97 | Temp 98.3°F | Ht 64.0 in | Wt 179.6 lb

## 2016-01-22 DIAGNOSIS — K5100 Ulcerative (chronic) pancolitis without complications: Secondary | ICD-10-CM

## 2016-01-22 DIAGNOSIS — K219 Gastro-esophageal reflux disease without esophagitis: Secondary | ICD-10-CM

## 2016-01-22 LAB — CBC WITH DIFFERENTIAL/PLATELET
Basophils Absolute: 73 cells/uL (ref 0–200)
Basophils Relative: 1 %
EOS ABS: 292 {cells}/uL (ref 15–500)
Eosinophils Relative: 4 %
HEMATOCRIT: 35.8 % (ref 35.0–45.0)
Hemoglobin: 11.4 g/dL — ABNORMAL LOW (ref 11.7–15.5)
LYMPHS PCT: 21 %
Lymphs Abs: 1533 cells/uL (ref 850–3900)
MCH: 29.6 pg (ref 27.0–33.0)
MCHC: 31.8 g/dL — AB (ref 32.0–36.0)
MCV: 93 fL (ref 80.0–100.0)
MONO ABS: 730 {cells}/uL (ref 200–950)
MPV: 10.5 fL (ref 7.5–12.5)
Monocytes Relative: 10 %
NEUTROS PCT: 64 %
Neutro Abs: 4672 cells/uL (ref 1500–7800)
Platelets: 384 10*3/uL (ref 140–400)
RBC: 3.85 MIL/uL (ref 3.80–5.10)
RDW: 14.9 % (ref 11.0–15.0)
WBC: 7.3 10*3/uL (ref 3.8–10.8)

## 2016-01-22 LAB — BASIC METABOLIC PANEL
BUN: 7 mg/dL (ref 7–25)
CHLORIDE: 103 mmol/L (ref 98–110)
CO2: 26 mmol/L (ref 20–31)
CREATININE: 0.98 mg/dL (ref 0.50–0.99)
Calcium: 9.1 mg/dL (ref 8.6–10.4)
GLUCOSE: 95 mg/dL (ref 65–99)
POTASSIUM: 4.1 mmol/L (ref 3.5–5.3)
Sodium: 138 mmol/L (ref 135–146)

## 2016-01-22 NOTE — Patient Instructions (Signed)
  No change in her medical regimen at this time. We'll obtain a stat C. Difficile toxin assay on stool, BMet and CBC today.  No need for urgent colonoscopy at this time.  However, plans could  change depending on clinical course. Further recommendations to follow.

## 2016-01-22 NOTE — Progress Notes (Signed)
Primary Care Physician:  Riley Lam Primary Gastroenterologist:  Dr. Gala Romney  Pre-Procedure History & Physical: HPI:  [name] is a 65 y.o. female here for bloody loose stools; history of colitis (segmental sigmoid colitis). Clinically, was doing well from standpoint of her IBD until a month ago when she underwent a right knee replacement. Postoperatively, course complicated by DVT for which she's been on Xarelto for one month. About the time of starting anticoagulation therapy, her stools became loose and she's had intermittent blood mixed with loose stools. She did go to a nursing home for rehabilitation and was on a week's worth of Cipro per patient report.  History of segmental colitis -  colonoscopy 2 years ago. Recently, I saw her for recurrent dysphagia in the setting of a Schatzki's ring. Her dysphagia symptoms have responded nicely to [facility] dilation. Her reflux symptoms are well controlled on Nexium. I note a downward trend in her hemoglobin. 9.2 just over a month ago. She is due to have lab work next week. She denies abdominal pain, fever or nonsteroidal use. Past Medical History  Diagnosis Date  . Depression   . Ulcerative colitis 2007  . GERD (gastroesophageal reflux disease)   . Pancreatitis   . COPD (chronic obstructive pulmonary disease) ([place])   . Diverticulitis   . Schatzki's ring   . DDD (degenerative disc disease)   . Chronic back pain   . Arthritis   . PVC's (premature ventricular contractions)   . Hiatal hernia   . Wears dentures     full top-partial bottom  . HOH (hard of hearing)     right  . Degenerative tear of posterior horn of lateral meniscus of right knee 01/27/2014  . Sleep apnea     CPAP at night  . Diabetes mellitus without complication ([place])   . PONV (postoperative nausea and vomiting)     only happened after knee arthroscopy  . Anginal pain ([place])     PATIENT STATES DOCT THINKS IS FIBROMYALGIA  . Shortness of breath dyspnea    WITH EXERTION   . Asthma   . Pneumonia     1 YR  . Headache     HX MIGRAINES    . Fibromyalgia   . Cancer ([place])     1980  CERVICAL  . Anemia   . Primary localized osteoarthritis of right knee 12/11/2015    Past Surgical History  Procedure Laterality Date  . Tonsillectomy    . Dilation and curettage of uterus    . Hernia repair    . Cholecystectomy    . Cervical cone biopsy    . Arch and foot    . Esophagogastroduodenoscopy  01/28/2011    Holley Wirt- Schatzki ring, s/p 73F, otherwise normal/Hiatal hernia otherwise normal stomach D1 and D2  . Colonoscopy  12/03/05    Bertha Earwood-marked inflammatory changes of the rectum, left colon, pan colitis, internal hemorrhoids, sigmoid diverticula  . Esophagogastroduodenoscopy  11/30/08    Salma Walrond-Schatzki's ring status post dilation, hiatal hernia  . Esophagogastroduodenoscopy  07/12/2007    Dilation with 56 French, Schatzki's ring  . Esophagogastroduodenoscopy (egd) with esophageal dilation  09/01/2012    Dr. Gala Romney- schatzki's ring, hiatal hernia  . Colonoscopy N/A 10/20/2013    Dr. Ayerim Berquist:Colonic diverticulosis. colonic mucosal ulcerations involving segment of sigmoid colon, atypical for UC. sigmoid colon path with chronic mildly active colitis consistent with IBD, other biopsies including ascending, transverse, descending colon and rectum unremarkable.   . Foot arthrodesis  [id number]    both feet  . Knee arthroscopy with lateral menisectomy Right 01/27/2014    Procedure: RIGHT KNEE ARTHROSCOPY WITH PARTIAL LATERAL MENISECTOMY, ;  Surgeon: Johnny Bridge, MD;  Location: [place];  Service: Orthopedics;  Laterality: Right;  . Esophagogastroduodenoscopy N/A 08/10/2014    Dr.Arianis Bowditch- prominent schatzki's ring s/p maloney dilation and disruption, moderate sized hiatal hernia  . Savory dilation N/A 08/10/2014    Procedure: SAVORY DILATION;  Surgeon: Daneil Dolin, MD;  [facility];  Service: Endoscopy;  Laterality: N/A;  [name]  dilation N/A 08/10/2014    Procedure: [name] DILATION;  Surgeon: Daneil Dolin, MD;  [facility];  Service: Endoscopy;  Laterality: N/A;  . Esophagogastroduodenoscopy N/A 09/20/2015    RMR: Schatzki's ring status post dilation as described above. Rather large hiatal hernia; somewhat baggy atonic esophageal body' otherwise normal EGD  . Esophageal dilation N/A 09/20/2015    Procedure: ESOPHAGEAL DILATION;  Surgeon: Daneil Dolin, MD;  [facility];  Service: Endoscopy;  Laterality: N/A;  . Shoulder open rotator cuff repair      BILAT  . Total knee arthroplasty Right 12/11/2015    Procedure: TOTAL KNEE ARTHROPLASTY;  Surgeon: Marchia Bond, MD;  Location: [place];  Service: Orthopedics;  Laterality: Right;  . Joint replacement      Prior to Admission medications   Medication Sig Start Date End Date Taking? Authorizing Provider  ADVAIR DISKUS 250-50 MCG/DOSE AEPB Inhale 1 puff into the lungs daily. 08/21/15  Yes Historical Provider, MD  albuterol (PROVENTIL) (2.5 MG/3ML) 0.083% nebulizer solution Take 2.5 mg by nebulization every 6 (six) hours as needed for wheezing or shortness of breath.   Yes Historical Provider, MD  albuterol (PROVENTIL,VENTOLIN) 90 MCG/ACT inhaler Inhale 2 puffs into the lungs every 4 (four) hours as needed for wheezing or shortness of breath.    Yes Historical Provider, MD  azelastine (ASTELIN) 137 MCG/SPRAY nasal spray 2 sprays by Nasal route 2 (two) times daily. Use in each nostril as directed    Yes Historical Provider, MD  baclofen (LIORESAL) 10 MG tablet Take 1 tablet (10 mg total) by mouth 3 (three) times daily. As needed for muscle spasm 12/11/15  Yes Marchia Bond, MD  balsalazide (COLAZAL) 750 MG capsule Take 3 capsules (2,250 mg total) by mouth 2 (two) times daily. 03/06/15  Yes Carlis Stable, NP  buPROPion (WELLBUTRIN XL) 150 MG 24 hr tablet Take 150 mg by mouth daily. 10/09/15  Yes Historical Provider, MD  busPIRone (BUSPAR) 15 MG tablet Take 7.5  mg by mouth 2 (two) times daily. 1/2 tablet bid   Yes Historical Provider, MD  Calcium Carb-Cholecalciferol (CALCIUM-VITAMIN D) 600-400 MG-UNIT TABS Take 1 tablet by mouth 2 (two) times daily.   Yes Historical Provider, MD  cetirizine (ZYRTEC) 10 MG tablet Take 10 mg by mouth daily.   Yes Historical Provider, MD  DULoxetine (CYMBALTA) 30 MG capsule Take 30 mg by mouth daily.   Yes Historical Provider, MD  DULoxetine (CYMBALTA) 60 MG capsule Take 60 mg by mouth daily.     Yes Historical Provider, MD  famotidine-calcium carbonate-magnesium hydroxide (PEPCID COMPLETE) 10-800-165 MG CHEW chewable tablet Chew 1 tablet by mouth daily as needed (for breakthrough GERD).    Yes Historical Provider, MD  ferrous gluconate (FERGON) 225 (27 FE) MG tablet Take 240 mg by mouth daily.    Yes Historical Provider, MD  ketotifen (ALAWAY) 0.025 % ophthalmic solution Place 2 drops into  both eyes 2 (two) times daily.   Yes Historical Provider, MD  metFORMIN (GLUCOPHAGE-XR) 500 MG 24 hr tablet Take 1,000 mg by mouth every evening.    Yes Historical Provider, MD  methylcellulose (CITRUCEL) oral powder Take 1 packet by mouth 2 (two) times daily.   Yes Historical Provider, MD  miconazole (NEOSPORIN AF) 2 % cream Apply 1 application topically daily as needed (for wound care).   Yes Historical Provider, MD  NEXIUM 40 MG capsule TAKE 1 CAPSULE DAILY BEFORE BREAKFAST 12/10/15  Yes Carlis Stable, NP  ondansetron (ZOFRAN) 4 MG tablet Take 1 tablet (4 mg total) by mouth every 8 (eight) hours as needed for nausea or vomiting. 12/11/15  Yes Marchia Bond, MD  pravastatin (PRAVACHOL) 20 MG tablet TAKE 1 TABLET EVERY EVENING 10/02/15  Yes Arnoldo Lenis, MD  pregabalin (LYRICA) 100 MG capsule Take 100 mg by mouth 2 (two) times daily.   Yes Historical Provider, MD  rivaroxaban (XARELTO) 10 MG TABS tablet Take 1 tablet (10 mg total) by mouth daily. Patient taking differently: Take 20 mg by mouth daily.  12/11/15  Yes Marchia Bond, MD    traMADol (ULTRAM-ER) 100 MG 24 hr tablet Take 100 mg by mouth at bedtime.   Yes Historical Provider, MD  traZODone (DESYREL) 100 MG tablet Take 100 mg by mouth at bedtime.     Yes Historical Provider, MD  oxyCODONE-acetaminophen (PERCOCET) 10-325 MG tablet Take 1-2 tablets by mouth every 6 (six) hours as needed for pain. MAXIMUM TOTAL ACETAMINOPHEN DOSE IS 4000 MG PER DAY Patient not taking: Reported on 01/22/2016 12/11/15   Marchia Bond, MD  sennosides-docusate sodium (SENOKOT-S) 8.6-50 MG tablet Take 2 tablets by mouth daily. Patient not taking: Reported on 01/22/2016 12/11/15   Marchia Bond, MD    Allergies as of 01/22/2016 - Review Complete 01/22/2016  Allergen Reaction Noted  . Mesalamine Other (See Comments)   . Cefprozil Other (See Comments)   . Cefuroxime axetil Other (See Comments) 08/08/2008  . Morphine and related Hives and Itching 08/23/2012  . Penicillins Hives 08/08/2008  . Aspirin Other (See Comments)   . Chicken allergy Nausea And Vomiting and Rash 11/27/2015  . Clarithromycin Nausea And Vomiting 08/08/2008  . Codeine Other (See Comments)   . Entex Other (See Comments) 08/08/2008    Family History  Problem Relation Age of Onset  . Colon cancer Neg Hx     Social History   Social History  . Marital Status: Married    Spouse Name: N/A  . Number of Children: 3  . Years of Education: N/A   Occupational History  . 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 Smoking status: Former Smoker -- 1.00 packs/day for 30 years    Types: Cigarettes    Start date: 07/07/1969    Quit date: 08/29/2013  . Smokeless tobacco: Never Used  . Alcohol Use: No  . Drug Use: No  . Sexual Activity:    Partners: Male    Birth Control/ Protection: None     Comment: spouse   Other Topics Concern  . Not on file   Social History Narrative    Review of Systems: See HPI, otherwise negative ROS  Physical Exam: BP 138/82 mmHg  Pulse 97  Temp(Src) 98.3 F (36.8  C)  Ht 5' 4"  (1.626 m)  Wt 179 lb 9.6 oz (81.466 kg)  BMI 30.81 kg/m2 General:   Alert,  Well-developed, well-nourished, pleasant and cooperative in NAD  Skin:  Intact without significant lesions or rashes. Eyes:  Sclera clear, no icterus.   Conjunctiva pink. Ears:  Normal auditory acuity. Nose:  No deformity, discharge,  or lesions. Mouth:  No deformity or lesions. Neck:  Supple; no masses or thyromegaly. No significant cervical adenopathy. Lungs:  Clear throughout to auscultation.   No wheezes, crackles, or rhonchi. No acute distress. Heart:  Regular rate and rhythm; no murmurs, clicks, rubs,  or gallops. Abdomen: Non-distended, normal bowel sounds.  Soft and nontender without appreciable mass or hepatosplenomegaly.  Pulses:  Normal pulses noted. Extremities:  Without clubbing or edema. Rectal: No mass in the rectal vault. Scant brown stool is trace Hemoccult positive.   Impression:  Pleasant 65 year old lady with inflammatory bowel disease (segmental colitis) doing well until anticoagulated for DVT one month ago. She was significantly anemic one month ago with a hemoglobin 9.2. No nonsteroidal use in the history.  Onset of lower GI tract symptoms correlate with the initiation of anticoagulation therapy. She does have significant loose stools. She was exposed to an institutional setting briefly along with antibiotics. Therefore, need to rule out a superimposed Clostridium difficile infection.  Patient denies abdominal pain, nausea vomiting or fever.  He does not look acutely ill or toxic. She's been compliant with her IBD regimen.  Weight down 9 pounds since her last visit.  Recommendations;  No change in her medical regimen at this time. We'll obtain a stat C. Difficile toxin assay on stool, BEMET and CBC today.  No need for urgent colonoscopy at this time.  However, plans could change depending on clinical course. Further recommendations to follow.   Notice: This dictation was  prepared with Dragon dictation along with smaller phrase technology. Any transcriptional errors that result from this process are unintentional and may not be corrected upon review.

## 2016-01-23 DIAGNOSIS — D235 Other benign neoplasm of skin of trunk: Secondary | ICD-10-CM | POA: Diagnosis not present

## 2016-01-23 DIAGNOSIS — L570 Actinic keratosis: Secondary | ICD-10-CM | POA: Diagnosis not present

## 2016-01-24 DIAGNOSIS — M25561 Pain in right knee: Secondary | ICD-10-CM | POA: Diagnosis not present

## 2016-01-24 DIAGNOSIS — R262 Difficulty in walking, not elsewhere classified: Secondary | ICD-10-CM | POA: Diagnosis not present

## 2016-01-25 LAB — CLOSTRIDIUM DIFFICILE BY PCR: CDIFFPCR: NOT DETECTED

## 2016-01-28 DIAGNOSIS — M25561 Pain in right knee: Secondary | ICD-10-CM | POA: Diagnosis not present

## 2016-01-28 DIAGNOSIS — R262 Difficulty in walking, not elsewhere classified: Secondary | ICD-10-CM | POA: Diagnosis not present

## 2016-01-29 ENCOUNTER — Other Ambulatory Visit: Payer: Self-pay | Admitting: Internal Medicine

## 2016-01-29 MED ORDER — BUDESONIDE 3 MG PO CPEP: 9.0000 mg | ORAL_CAPSULE | Freq: Every day | ORAL | Status: DC

## 2016-01-30 DIAGNOSIS — I2699 Other pulmonary embolism without acute cor pulmonale: Secondary | ICD-10-CM | POA: Diagnosis not present

## 2016-01-30 DIAGNOSIS — Z96651 Presence of right artificial knee joint: Secondary | ICD-10-CM | POA: Diagnosis not present

## 2016-01-31 DIAGNOSIS — M25561 Pain in right knee: Secondary | ICD-10-CM | POA: Diagnosis not present

## 2016-01-31 DIAGNOSIS — R262 Difficulty in walking, not elsewhere classified: Secondary | ICD-10-CM | POA: Diagnosis not present

## 2016-02-04 DIAGNOSIS — R262 Difficulty in walking, not elsewhere classified: Secondary | ICD-10-CM | POA: Diagnosis not present

## 2016-02-04 DIAGNOSIS — M25561 Pain in right knee: Secondary | ICD-10-CM | POA: Diagnosis not present

## 2016-02-06 DIAGNOSIS — K219 Gastro-esophageal reflux disease without esophagitis: Secondary | ICD-10-CM | POA: Diagnosis not present

## 2016-02-06 DIAGNOSIS — E1165 Type 2 diabetes mellitus with hyperglycemia: Secondary | ICD-10-CM | POA: Diagnosis not present

## 2016-02-06 DIAGNOSIS — E782 Mixed hyperlipidemia: Secondary | ICD-10-CM | POA: Diagnosis not present

## 2016-02-07 DIAGNOSIS — R262 Difficulty in walking, not elsewhere classified: Secondary | ICD-10-CM | POA: Diagnosis not present

## 2016-02-07 DIAGNOSIS — M25561 Pain in right knee: Secondary | ICD-10-CM | POA: Diagnosis not present

## 2016-02-11 DIAGNOSIS — R262 Difficulty in walking, not elsewhere classified: Secondary | ICD-10-CM | POA: Diagnosis not present

## 2016-02-11 DIAGNOSIS — M25561 Pain in right knee: Secondary | ICD-10-CM | POA: Diagnosis not present

## 2016-02-12 DIAGNOSIS — J029 Acute pharyngitis, unspecified: Secondary | ICD-10-CM | POA: Diagnosis not present

## 2016-02-12 DIAGNOSIS — G4733 Obstructive sleep apnea (adult) (pediatric): Secondary | ICD-10-CM | POA: Diagnosis not present

## 2016-02-12 DIAGNOSIS — K5190 Ulcerative colitis, unspecified, without complications: Secondary | ICD-10-CM | POA: Diagnosis not present

## 2016-02-12 DIAGNOSIS — K219 Gastro-esophageal reflux disease without esophagitis: Secondary | ICD-10-CM | POA: Diagnosis not present

## 2016-02-12 DIAGNOSIS — J4520 Mild intermittent asthma, uncomplicated: Secondary | ICD-10-CM | POA: Diagnosis not present

## 2016-02-12 DIAGNOSIS — F329 Major depressive disorder, single episode, unspecified: Secondary | ICD-10-CM | POA: Diagnosis not present

## 2016-02-12 DIAGNOSIS — E119 Type 2 diabetes mellitus without complications: Secondary | ICD-10-CM | POA: Diagnosis not present

## 2016-02-12 DIAGNOSIS — J309 Allergic rhinitis, unspecified: Secondary | ICD-10-CM | POA: Diagnosis not present

## 2016-02-13 DIAGNOSIS — Z96651 Presence of right artificial knee joint: Secondary | ICD-10-CM | POA: Diagnosis not present

## 2016-02-14 DIAGNOSIS — R262 Difficulty in walking, not elsewhere classified: Secondary | ICD-10-CM | POA: Diagnosis not present

## 2016-02-14 DIAGNOSIS — M25561 Pain in right knee: Secondary | ICD-10-CM | POA: Diagnosis not present

## 2016-02-16 DIAGNOSIS — H8111 Benign paroxysmal vertigo, right ear: Secondary | ICD-10-CM | POA: Diagnosis not present

## 2016-02-21 DIAGNOSIS — M25561 Pain in right knee: Secondary | ICD-10-CM | POA: Diagnosis not present

## 2016-02-21 DIAGNOSIS — R262 Difficulty in walking, not elsewhere classified: Secondary | ICD-10-CM | POA: Diagnosis not present

## 2016-03-03 ENCOUNTER — Encounter: Payer: Self-pay | Admitting: Gastroenterology

## 2016-03-03 ENCOUNTER — Ambulatory Visit (INDEPENDENT_AMBULATORY_CARE_PROVIDER_SITE_OTHER): Payer: Medicare Other | Admitting: Gastroenterology

## 2016-03-03 VITALS — BP 122/72 | HR 90 | Temp 98.7°F | Ht 64.0 in | Wt 179.2 lb

## 2016-03-03 DIAGNOSIS — K51911 Ulcerative colitis, unspecified with rectal bleeding: Secondary | ICD-10-CM | POA: Diagnosis not present

## 2016-03-03 DIAGNOSIS — K5190 Ulcerative colitis, unspecified, without complications: Secondary | ICD-10-CM | POA: Diagnosis not present

## 2016-03-03 MED ORDER — BUDESONIDE 3 MG PO CPEP: ORAL_CAPSULE | ORAL | Status: DC

## 2016-03-03 MED ORDER — ROWASA 4 G RE KIT: 1.0000 | PACK | Freq: Every day | RECTAL | Status: DC

## 2016-03-03 NOTE — Patient Instructions (Signed)
Please complete blood work today.   Finish up the bottle of entocort that you have. I have sent another prescription for tapering, with directions as follows: Take 2 capsules daily for 2 weeks, then 1 capsule daily for 1 week, then 1 capsule every other day for a week and stop.  I have sent in Rowasa enemas to take each evening. Please call in about a week with an update on how you are doing.

## 2016-03-03 NOTE — Progress Notes (Signed)
Referring Provider: Riley Lam Primary Care Physician:  Jalene Mullet, PA-C  Primary GI: Dr. Gala Romney   Chief Complaint  Patient presents with  . Follow-up    HPI:   [name] is a 65 y.o. female presenting today with a history of IBD (segmental colitis), last seen in April 2017 with last colonoscopy in 2015. History of UC, with colonoscopy in 2015 and presentation atypical for UC.  Started on anticoagulation for DVT in March 2017, with onset of hematochezia and anemia. Hgb much improved when last checked in April (from 9 to 11 range). Stool studies negative for infectious process, and she was started on entocort for suspected flare.   Still with some clots. Remains on entocort, going on 6 weeks now. Will be on anticoagulation till Jan 2018 (Xarelto). Diarrhea not as bad. Still has "some" but not as bad. Occasional abdominal discomfort, radiates across. No NSAIDs. Colazal three capsules BID.    Past Medical History  Diagnosis Date  . Depression   . Ulcerative colitis 2007  . GERD (gastroesophageal reflux disease)   . Pancreatitis   . COPD (chronic obstructive pulmonary disease) ([place])   . Diverticulitis   . Schatzki's ring   . DDD (degenerative disc disease)   . Chronic back pain   . Arthritis   . PVC's (premature ventricular contractions)   . Hiatal hernia   . Wears dentures     full top-partial bottom  . HOH (hard of hearing)     right  . Degenerative tear of posterior horn of lateral meniscus of right knee 01/27/2014  . Sleep apnea     CPAP at night  . Diabetes mellitus without complication ([place])   . PONV (postoperative nausea and vomiting)     only happened after knee arthroscopy  . Anginal pain ([place])     PATIENT STATES DOCT THINKS IS FIBROMYALGIA  . Shortness of breath dyspnea     WITH EXERTION   . Asthma   . Pneumonia     1 YR  . Headache     HX MIGRAINES    . Fibromyalgia   . Cancer ([place])     1980  CERVICAL  . Anemia   . Primary localized  osteoarthritis of right knee 12/11/2015    Past Surgical History  Procedure Laterality Date  . Tonsillectomy    . Dilation and curettage of uterus    . Hernia repair    . Cholecystectomy    . Cervical cone biopsy    . Arch and foot    . Esophagogastroduodenoscopy  01/28/2011    Rourk- Schatzki ring, s/p 59F, otherwise normal/Hiatal hernia otherwise normal stomach D1 and D2  . Colonoscopy  12/03/05    Rourk-marked inflammatory changes of the rectum, left colon, pan colitis, internal hemorrhoids, sigmoid diverticula  . Esophagogastroduodenoscopy  11/30/08    Rourk-Schatzki's ring status post dilation, hiatal hernia  . Esophagogastroduodenoscopy  07/12/2007    Dilation with 56 French, Schatzki's ring  . Esophagogastroduodenoscopy (egd) with esophageal dilation  09/01/2012    Dr. Gala Romney- schatzki's ring, hiatal hernia  . Colonoscopy N/A 10/20/2013    Dr. Rourk:Colonic diverticulosis. colonic mucosal ulcerations involving segment of sigmoid colon, atypical for UC. sigmoid colon path with chronic mildly active colitis consistent with IBD, other biopsies including ascending, transverse, descending colon and rectum unremarkable.   . Foot arthrodesis  [id number]    both feet  . Knee arthroscopy with lateral menisectomy Right 01/27/2014    Procedure:  RIGHT KNEE ARTHROSCOPY WITH PARTIAL LATERAL MENISECTOMY, ;  Surgeon: Johnny Bridge, MD;  Location: [place];  Service: Orthopedics;  Laterality: Right;  . Esophagogastroduodenoscopy N/A 08/10/2014    Dr.Rourk- prominent schatzki's ring s/p maloney dilation and disruption, moderate sized hiatal hernia  . Savory dilation N/A 08/10/2014    Procedure: SAVORY DILATION;  Surgeon: Daneil Dolin, MD;  [facility];  Service: Endoscopy;  Laterality: N/A;  [name] dilation N/A 08/10/2014    Procedure: [name] DILATION;  Surgeon: Daneil Dolin, MD;  [facility];  Service: Endoscopy;  Laterality: N/A;  .  Esophagogastroduodenoscopy N/A 09/20/2015    RMR: Schatzki's ring status post dilation as described above. Rather large hiatal hernia; somewhat baggy atonic esophageal body' otherwise normal EGD  . Esophageal dilation N/A 09/20/2015    Procedure: ESOPHAGEAL DILATION;  Surgeon: Daneil Dolin, MD;  [facility];  Service: Endoscopy;  Laterality: N/A;  . Shoulder open rotator cuff repair      BILAT  . Total knee arthroplasty Right 12/11/2015    Procedure: TOTAL KNEE ARTHROPLASTY;  Surgeon: Marchia Bond, MD;  Location: [place];  Service: Orthopedics;  Laterality: Right;  . Joint replacement      Current Outpatient Prescriptions  Medication Sig Dispense Refill  . ADVAIR DISKUS 250-50 MCG/DOSE AEPB Inhale 1 puff into the lungs daily.    Marland Kitchen albuterol (PROVENTIL) (2.5 MG/3ML) 0.083% nebulizer solution Take 2.5 mg by nebulization every 6 (six) hours as needed for wheezing or shortness of breath.    Marland Kitchen albuterol (PROVENTIL,VENTOLIN) 90 MCG/ACT inhaler Inhale 2 puffs into the lungs every 4 (four) hours as needed for wheezing or shortness of breath.     Marland Kitchen azelastine (ASTELIN) 137 MCG/SPRAY nasal spray 2 sprays by Nasal route 2 (two) times daily. Use in each nostril as directed     . baclofen (LIORESAL) 10 MG tablet Take 1 tablet (10 mg total) by mouth 3 (three) times daily. As needed for muscle spasm 50 tablet 0  . balsalazide (COLAZAL) 750 MG capsule Take 3 capsules (2,250 mg total) by mouth 2 (two) times daily. 540 capsule 3  . budesonide (ENTOCORT EC) 3 MG 24 hr capsule Take 3 capsules (9 mg total) by mouth daily. 90 capsule 0  . buPROPion (WELLBUTRIN XL) 150 MG 24 hr tablet Take 150 mg by mouth daily.    . busPIRone (BUSPAR) 15 MG tablet Take 7.5 mg by mouth 2 (two) times daily. 1/2 tablet bid    . Calcium Carb-Cholecalciferol (CALCIUM-VITAMIN D) 600-400 MG-UNIT TABS Take 1 tablet by mouth 2 (two) times daily.    . cetirizine (ZYRTEC) 10 MG tablet Take 10 mg by mouth daily.    . DULoxetine  (CYMBALTA) 30 MG capsule Take 30 mg by mouth daily.    . DULoxetine (CYMBALTA) 60 MG capsule Take 60 mg by mouth daily.      . famotidine-calcium carbonate-magnesium hydroxide (PEPCID COMPLETE) 10-800-165 MG CHEW chewable tablet Chew 1 tablet by mouth daily as needed (for breakthrough GERD).     . ferrous gluconate (FERGON) 225 (27 FE) MG tablet Take 240 mg by mouth daily.     Marland Kitchen ketotifen (ALAWAY) 0.025 % ophthalmic solution Place 2 drops into both eyes 2 (two) times daily.    . metFORMIN (GLUCOPHAGE-XR) 500 MG 24 hr tablet Take 1,000 mg by mouth every evening.     . methylcellulose (CITRUCEL) oral powder Take 1 packet by mouth 2 (two) times daily.    Marland Kitchen  miconazole (NEOSPORIN AF) 2 % cream Apply 1 application topically daily as needed (for wound care).    Marland Kitchen NEXIUM 40 MG capsule TAKE 1 CAPSULE DAILY BEFORE BREAKFAST 90 capsule 0  . ondansetron (ZOFRAN) 4 MG tablet Take 1 tablet (4 mg total) by mouth every 8 (eight) hours as needed for nausea or vomiting. 30 tablet 0  . pravastatin (PRAVACHOL) 20 MG tablet TAKE 1 TABLET EVERY EVENING 90 tablet 2  . pregabalin (LYRICA) 100 MG capsule Take 100 mg by mouth 2 (two) times daily.    . traMADol (ULTRAM-ER) 100 MG 24 hr tablet Take 100 mg by mouth at bedtime.    . traZODone (DESYREL) 100 MG tablet Take 100 mg by mouth at bedtime.      Marland Kitchen oxyCODONE-acetaminophen (PERCOCET) 10-325 MG tablet Take 1-2 tablets by mouth every 6 (six) hours as needed for pain. MAXIMUM TOTAL ACETAMINOPHEN DOSE IS 4000 MG PER DAY (Patient not taking: Reported on 01/22/2016) 50 tablet 0  . rivaroxaban (XARELTO) 10 MG TABS tablet Take 1 tablet (10 mg total) by mouth daily. (Patient not taking: Reported on 03/03/2016) 21 tablet 0  . sennosides-docusate sodium (SENOKOT-S) 8.6-50 MG tablet Take 2 tablets by mouth daily. (Patient not taking: Reported on 01/22/2016) 30 tablet 1   No current facility-administered medications for this visit.    Allergies as of 03/03/2016 - Review Complete  03/03/2016  Allergen Reaction Noted  . Mesalamine Other (See Comments)   . Cefprozil Other (See Comments)   . Cefuroxime axetil Other (See Comments) 08/08/2008  . Morphine and related Hives and Itching 08/23/2012  . Penicillins Hives 08/08/2008  . Aspirin Other (See Comments)   . Chicken allergy Nausea And Vomiting and Rash 11/27/2015  . Clarithromycin Nausea And Vomiting 08/08/2008  . Codeine Other (See Comments)   . Entex Other (See Comments) 08/08/2008    Family History  Problem Relation Age of Onset  . Colon cancer Neg Hx     Social History   Social History  . Marital Status: Married    Spouse Name: N/A  . Number of Children: 3  . Years of Education: N/A   Occupational History  . 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 Smoking status: Former Smoker -- 1.00 packs/day for 30 years    Types: Cigarettes    Start date: 07/07/1969    Quit date: 08/29/2013  . Smokeless tobacco: Never Used  . Alcohol Use: No  . Drug Use: No  . Sexual Activity:    Partners: Male    Birth Control/ Protection: None     Comment: spouse   Other Topics Concern  . None   Social History Narrative    Review of Systems: As mentioned in HPI.   Physical Exam: BP 122/72 mmHg  Pulse 90  Temp(Src) 98.7 F (37.1 C)  Ht 5' 4"  (1.626 m)  Wt 179 lb 3.2 oz (81.285 kg)  BMI 30.74 kg/m2 General:   Alert and oriented. No distress noted. Pleasant and cooperative.  Head:  Normocephalic and atraumatic. Eyes:  Conjuctiva clear without scleral icterus. Abdomen:  +BS, soft, non-tender and non-distended. No rebound or guarding. No HSM or masses noted. Msk:  Symmetrical without gross deformities. Normal posture. Extremities:  Without edema. Neurologic:  Alert and  oriented x4;  grossly normal neurologically. Psych:  Alert and cooperative. Normal mood and affect.  Lab Results  Component Value Date   WBC 7.3 01/22/2016   HGB 11.4* 01/22/2016   HCT  35.8 01/22/2016   MCV 93.0  01/22/2016   PLT 384 01/22/2016

## 2016-03-04 LAB — C-REACTIVE PROTEIN

## 2016-03-04 LAB — SEDIMENTATION RATE: SED RATE: 5 mm/h (ref 0–30)

## 2016-03-05 DIAGNOSIS — Z96651 Presence of right artificial knee joint: Secondary | ICD-10-CM | POA: Diagnosis not present

## 2016-03-05 NOTE — Progress Notes (Signed)
CC'ED TO PCP 

## 2016-03-05 NOTE — Assessment & Plan Note (Signed)
Historically maintained on Colazal. Recent anticoagulation started for DVT and now with intermittent rectal bleeding. On entocort for flare. Will complete this month's prescription then start taper. I feel she needs topical therapy as well. Start Rowasa enemas. Check CRP and sed rate. Progress report in 1 week.

## 2016-03-07 DIAGNOSIS — H52223 Regular astigmatism, bilateral: Secondary | ICD-10-CM | POA: Diagnosis not present

## 2016-03-07 DIAGNOSIS — H43813 Vitreous degeneration, bilateral: Secondary | ICD-10-CM | POA: Diagnosis not present

## 2016-03-07 DIAGNOSIS — H524 Presbyopia: Secondary | ICD-10-CM | POA: Diagnosis not present

## 2016-03-07 DIAGNOSIS — E119 Type 2 diabetes mellitus without complications: Secondary | ICD-10-CM | POA: Diagnosis not present

## 2016-03-07 DIAGNOSIS — H5203 Hypermetropia, bilateral: Secondary | ICD-10-CM | POA: Diagnosis not present

## 2016-03-07 DIAGNOSIS — H2513 Age-related nuclear cataract, bilateral: Secondary | ICD-10-CM | POA: Diagnosis not present

## 2016-03-12 DIAGNOSIS — J4541 Moderate persistent asthma with (acute) exacerbation: Secondary | ICD-10-CM | POA: Diagnosis not present

## 2016-03-12 DIAGNOSIS — R05 Cough: Secondary | ICD-10-CM | POA: Diagnosis not present

## 2016-03-12 NOTE — Progress Notes (Signed)
Quick Note:  Sed rate and CRP normal. Not sure baseline. I hope she is doing better with topical therapy added. Let's see her back in 6 weeks. ______

## 2016-03-17 ENCOUNTER — Encounter: Payer: Self-pay | Admitting: Internal Medicine

## 2016-03-17 NOTE — Progress Notes (Signed)
APPT MADE AND LETTER SENT  °

## 2016-03-19 ENCOUNTER — Telehealth: Payer: Self-pay

## 2016-03-19 MED ORDER — BUDESONIDE 3 MG PO CPEP: 6.0000 mg | ORAL_CAPSULE | Freq: Every day | ORAL | Status: DC

## 2016-03-19 NOTE — Telephone Encounter (Signed)
I believe her pancreatitis was related to Asacahol many years ago. Okay to stop / hold mesalamine enemas. I go back on Entocort 6 mg daily for the next 4 weeks weeks and then office follow-up with AS.

## 2016-03-19 NOTE — Telephone Encounter (Signed)
Pt was seen by AS on 03/03/16- pt was given instructions for an entocort taper and an rx for rowasa enemas and she was to call in 1 week with a progress report. PA had to be done for the enemas. Pt called today, she said she could not use the enemas because the last time she did, she ended up with pancreatitis. I asked the pt how she was doing. She said she was not having as many bloody stools, some diarrhea at times but mostly formed stool. She is not in as much pain. I asked her what dosage she was at with her taper and she said she finished it last Friday.  I went over the taper instructions from her AVS that she was given at her office visit and she said "oh, I thought I took it the way she told me to". Pt said she took her last entocort on Friday.  I called the pharmacy [facility]) and spoke with Aldona Bar, she said pt did pick up rx. Pt is better but not completely better. She wants to know what she should do now since she cannot use the enemas?

## 2016-03-19 NOTE — Telephone Encounter (Signed)
Pt is aware. She has #36 of entocort at home now. (she counted them while I was on the phone with her) I have sent in rx for #24 so she has enough for 4 weeks.  Please schedule ov with AS.

## 2016-03-19 NOTE — Telephone Encounter (Signed)
PATIENT HAS APPT 04/2016 WITH ANNA

## 2016-04-03 DIAGNOSIS — R509 Fever, unspecified: Secondary | ICD-10-CM | POA: Diagnosis not present

## 2016-04-03 DIAGNOSIS — R1032 Left lower quadrant pain: Secondary | ICD-10-CM | POA: Diagnosis not present

## 2016-04-03 DIAGNOSIS — Z6830 Body mass index (BMI) 30.0-30.9, adult: Secondary | ICD-10-CM | POA: Diagnosis not present

## 2016-04-14 ENCOUNTER — Other Ambulatory Visit: Payer: Self-pay | Admitting: Nurse Practitioner

## 2016-04-14 DIAGNOSIS — M7989 Other specified soft tissue disorders: Secondary | ICD-10-CM | POA: Diagnosis not present

## 2016-04-14 DIAGNOSIS — M19072 Primary osteoarthritis, left ankle and foot: Secondary | ICD-10-CM | POA: Diagnosis not present

## 2016-04-14 DIAGNOSIS — M25572 Pain in left ankle and joints of left foot: Secondary | ICD-10-CM | POA: Diagnosis not present

## 2016-04-14 DIAGNOSIS — R600 Localized edema: Secondary | ICD-10-CM | POA: Diagnosis not present

## 2016-04-14 DIAGNOSIS — Z7901 Long term (current) use of anticoagulants: Secondary | ICD-10-CM | POA: Diagnosis not present

## 2016-04-14 DIAGNOSIS — M79662 Pain in left lower leg: Secondary | ICD-10-CM | POA: Diagnosis not present

## 2016-04-21 DIAGNOSIS — Z1231 Encounter for screening mammogram for malignant neoplasm of breast: Secondary | ICD-10-CM | POA: Diagnosis not present

## 2016-04-23 ENCOUNTER — Telehealth: Payer: Self-pay

## 2016-04-23 MED ORDER — OMEPRAZOLE 20 MG PO CPDR: 20.0000 mg | DELAYED_RELEASE_CAPSULE | Freq: Every day | ORAL | 3 refills | Status: DC

## 2016-04-23 NOTE — Telephone Encounter (Signed)
I sent in Omeprazole 20 mg, disp#90 with 3 refills.

## 2016-04-23 NOTE — Telephone Encounter (Signed)
Received a fax from the pts insurance, pt has to try and fail: rabeprazole, omeprazole and pantoprazole to get nexium covered.   She has tried: lansoprazole and pantoprazole and pepcid complete.   I have placed the paper work on [employer].

## 2016-05-01 ENCOUNTER — Other Ambulatory Visit (HOSPITAL_COMMUNITY)
Admission: RE | Admit: 2016-05-01 | Discharge: 2016-05-01 | Disposition: A | Discharge status: Home / Self Care | Payer: Medicare Other | Source: Ambulatory Visit | Attending: Gastroenterology | Admitting: Gastroenterology

## 2016-05-01 ENCOUNTER — Encounter: Payer: Self-pay | Admitting: Gastroenterology

## 2016-05-01 ENCOUNTER — Ambulatory Visit (HOSPITAL_COMMUNITY)
Admission: RE | Admit: 2016-05-01 | Discharge: 2016-05-01 | Disposition: A | Discharge status: Home / Self Care | Payer: Medicare Other | Source: Ambulatory Visit | Attending: Gastroenterology | Admitting: Gastroenterology

## 2016-05-01 ENCOUNTER — Ambulatory Visit (INDEPENDENT_AMBULATORY_CARE_PROVIDER_SITE_OTHER): Payer: Medicare Other | Admitting: Gastroenterology

## 2016-05-01 VITALS — BP 120/65 | HR 92 | Temp 98.8°F | Ht 64.0 in | Wt 180.6 lb

## 2016-05-01 DIAGNOSIS — I700 Atherosclerosis of aorta: Secondary | ICD-10-CM | POA: Insufficient documentation

## 2016-05-01 DIAGNOSIS — R1032 Left lower quadrant pain: Secondary | ICD-10-CM | POA: Diagnosis not present

## 2016-05-01 DIAGNOSIS — K449 Diaphragmatic hernia without obstruction or gangrene: Secondary | ICD-10-CM | POA: Insufficient documentation

## 2016-05-01 DIAGNOSIS — K5730 Diverticulosis of large intestine without perforation or abscess without bleeding: Secondary | ICD-10-CM | POA: Insufficient documentation

## 2016-05-01 LAB — BASIC METABOLIC PANEL
Anion gap: 5 (ref 5–15)
BUN: 9 mg/dL (ref 6–20)
CALCIUM: 9 mg/dL (ref 8.9–10.3)
CO2: 28 mmol/L (ref 22–32)
Chloride: 105 mmol/L (ref 101–111)
Creatinine, Ser: 0.76 mg/dL (ref 0.44–1.00)
GFR calc Af Amer: >60 mL/min (ref >60)
Glucose, Bld: 86 mg/dL (ref 65–99)
Potassium: 3.2 mmol/L — ABNORMAL LOW (ref 3.5–5.1)
SODIUM: 138 mmol/L (ref 135–145)

## 2016-05-01 MED ORDER — HYDROCODONE-ACETAMINOPHEN 5-325 MG PO TABS: 1.0000 | ORAL_TABLET | Freq: Four times a day (QID) | ORAL | 0 refills | Status: DC | PRN

## 2016-05-01 MED ORDER — IOPAMIDOL (ISOVUE-300) INJECTION 61%
100.0000 mL | Freq: Once | INTRAVENOUS | Status: AC | PRN
  Administered 2016-05-01: 100 mL via INTRAVENOUS

## 2016-05-01 MED ORDER — ESOMEPRAZOLE MAGNESIUM 40 MG PO CPDR: 40.0000 mg | DELAYED_RELEASE_CAPSULE | ORAL | 3 refills | Status: DC

## 2016-05-01 NOTE — Patient Instructions (Addendum)
I have ordered a CT scan and blood work to be done as soon as possible.  I have given you pain medicine to take as needed until we know what is going on.   Please call us if you have any questions. If you have severe abdominal pain, nausea, vomiting, abdominal distension, go to the emergency room.

## 2016-05-01 NOTE — Progress Notes (Signed)
Referring Provider: Riley Lam Primary Care Physician:  Jalene Mullet, PA-C  Primary GI: Dr. Gala Romney   Chief Complaint  Patient presents with  . Follow-up  . Abdominal Pain    L side had diverticulitis recently.   . Constipation  . Diarrhea    HPI:   [name] is a 65 y.o. female presenting today with a history of IBD (segmental colitis), with last colonoscopy in 2015. History of UC, with colonoscopy in 2015 and presentation atypical UC. Started on anticoagulation for DVT in March 2017, with onset of hematochezia and anemia. Hgb much improved when last checked in April (from 9 to 11 range). Stool studies negative for infectious process, and she was started on entocort for suspected flare. Will be on anticoagulation till Jan 2018.   Has been having alternating diarrhea with constipation. Left sided abdominal pain, radiating to right side for about a week now. Completed entocort. Will wake up in the morning and have a hard BM, then have loose stool the rest of the day. Will have between 3-6 loose stool after the hard stool. Bleeding has improved significantly but did see some dark brown blood in her stool the other day but none since. Marcelle Overlie, PA at [facility] treated for presumed diverticulitis around July 4th. No abdominal imaging. Prescribed Azithromycin and Flagyl. Symptoms improved with that but now returned.   Nexium is the only medication that has helped and "kept things at bay".   Past Medical History:  Diagnosis Date  . Anemia   . Anginal pain ([place])    PATIENT STATES DOCT THINKS IS FIBROMYALGIA  . Arthritis   . Asthma   . Cancer ([place])    1980  CERVICAL  . Chronic back pain   . COPD (chronic obstructive pulmonary disease) ([place])   . DDD (degenerative disc disease)   . Degenerative tear of posterior horn of lateral meniscus of right knee 01/27/2014  . Depression   . Diabetes mellitus without complication ([place])   . Diverticulitis   . Fibromyalgia   . GERD  (gastroesophageal reflux disease)   . Headache    HX MIGRAINES    . Hiatal hernia   . HOH (hard of hearing)    right  . Pancreatitis   . Pneumonia    1 YR  . PONV (postoperative nausea and vomiting)    only happened after knee arthroscopy  . Primary localized osteoarthritis of right knee 12/11/2015  . PVC's (premature ventricular contractions)   . Schatzki's ring   . Shortness of breath dyspnea    WITH EXERTION   . Sleep apnea    CPAP at night  . Ulcerative colitis 2007  . Wears dentures    full top-partial bottom    Past Surgical History:  Procedure Laterality Date  . arch and foot    . CERVICAL CONE BIOPSY    . CHOLECYSTECTOMY    . COLONOSCOPY  12/03/05   Rourk-marked inflammatory changes of the rectum, left colon, pan colitis, internal hemorrhoids, sigmoid diverticula  . COLONOSCOPY N/A 10/20/2013   Dr. Rourk:Colonic diverticulosis. colonic mucosal ulcerations involving segment of sigmoid colon, atypical for UC. sigmoid colon path with chronic mildly active colitis consistent with IBD, other biopsies including ascending, transverse, descending colon and rectum unremarkable.   Marland Kitchen DILATION AND CURETTAGE OF UTERUS    . ESOPHAGEAL DILATION N/A 09/20/2015   Procedure: ESOPHAGEAL DILATION;  Surgeon: Daneil Dolin, MD;  [facility];  Service: Endoscopy;  Laterality: N/A;  . ESOPHAGOGASTRODUODENOSCOPY  01/28/2011   Rourk- Schatzki ring, s/p 28F, otherwise normal/Hiatal hernia otherwise normal stomach D1 and D2  . ESOPHAGOGASTRODUODENOSCOPY  11/30/08   Rourk-Schatzki's ring status post dilation, hiatal hernia  . ESOPHAGOGASTRODUODENOSCOPY  07/12/2007   Dilation with 56 French, Schatzki's ring  . ESOPHAGOGASTRODUODENOSCOPY N/A 08/10/2014   Dr.Rourk- prominent schatzki's ring s/p maloney dilation and disruption, moderate sized hiatal hernia  . ESOPHAGOGASTRODUODENOSCOPY N/A 09/20/2015   RMR: Schatzki's ring status post dilation as described above. Rather large hiatal hernia;  somewhat baggy atonic esophageal body' otherwise normal EGD  . ESOPHAGOGASTRODUODENOSCOPY (EGD) WITH ESOPHAGEAL DILATION  09/01/2012   Dr. Gala Romney- schatzki's ring, hiatal hernia  . FOOT ARTHRODESIS  [id number]   both feet  . HERNIA REPAIR    . JOINT REPLACEMENT    . KNEE ARTHROSCOPY WITH LATERAL MENISECTOMY Right 01/27/2014   Procedure: RIGHT KNEE ARTHROSCOPY WITH PARTIAL LATERAL MENISECTOMY, ;  Surgeon: Johnny Bridge, MD;  Location: [facility];  Service: Orthopedics;  Laterality: Right;  . MALONEY DILATION N/A 08/10/2014   Procedure: [name] DILATION;  Surgeon: Daneil Dolin, MD;  [facility];  Service: Endoscopy;  Laterality: N/A;  . SAVORY DILATION N/A 08/10/2014   Procedure: SAVORY DILATION;  Surgeon: Daneil Dolin, MD;  [facility];  Service: Endoscopy;  Laterality: N/A;  . SHOULDER OPEN ROTATOR CUFF REPAIR     BILAT  . TONSILLECTOMY    . TOTAL KNEE ARTHROPLASTY Right 12/11/2015   Procedure: TOTAL KNEE ARTHROPLASTY;  Surgeon: Marchia Bond, MD;  Location: [facility];  Service: Orthopedics;  Laterality: Right;    Current Outpatient Prescriptions  Medication Sig Dispense Refill  . ADVAIR DISKUS 250-50 MCG/DOSE AEPB Inhale 1 puff into the lungs daily.    Marland Kitchen albuterol (PROVENTIL) (2.5 MG/3ML) 0.083% nebulizer solution Take 2.5 mg by nebulization every 6 (six) hours as needed for wheezing or shortness of breath.    Marland Kitchen albuterol (PROVENTIL,VENTOLIN) 90 MCG/ACT inhaler Inhale 2 puffs into the lungs every 4 (four) hours as needed for wheezing or shortness of breath.     Marland Kitchen azelastine (ASTELIN) 137 MCG/SPRAY nasal spray 2 sprays by Nasal route 2 (two) times daily. Use in each nostril as directed     . balsalazide (COLAZAL) 750 MG capsule Take 3 capsules (2,250 mg total) by mouth 2 (two) times daily. 540 capsule 3  . buPROPion (WELLBUTRIN XL) 150 MG 24 hr tablet Take 150 mg by mouth daily.    . busPIRone (BUSPAR) 15 MG tablet Take 7.5 mg by mouth 2 (two) times  daily. 1/2 tablet bid    . Calcium Carb-Cholecalciferol (CALCIUM-VITAMIN D) 600-400 MG-UNIT TABS Take 1 tablet by mouth 2 (two) times daily.    . cetirizine (ZYRTEC) 10 MG tablet Take 10 mg by mouth daily.    . DULoxetine (CYMBALTA) 30 MG capsule Take 30 mg by mouth daily.    . famotidine-calcium carbonate-magnesium hydroxide (PEPCID COMPLETE) 10-800-165 MG CHEW chewable tablet Chew 1 tablet by mouth daily as needed (for breakthrough GERD).     . ferrous gluconate (FERGON) 225 (27 FE) MG tablet Take 240 mg by mouth daily.     Marland Kitchen ketotifen (ALAWAY) 0.025 % ophthalmic solution Place 2 drops into both eyes 2 (two) times daily.    . metFORMIN (GLUCOPHAGE-XR) 500 MG 24 hr tablet Take 1,000 mg by mouth every evening.     . methylcellulose (CITRUCEL) oral powder Take 1 packet by mouth 2 (two) times daily.    Marland Kitchen  miconazole (NEOSPORIN AF) 2 % cream Apply 1 application topically daily as needed (for wound care).    Marland Kitchen omeprazole (PRILOSEC) 20 MG capsule Take 1 capsule (20 mg total) by mouth daily. 90 capsule 3  . pravastatin (PRAVACHOL) 20 MG tablet TAKE 1 TABLET EVERY EVENING 90 tablet 2  . pregabalin (LYRICA) 100 MG capsule Take 100 mg by mouth 2 (two) times daily.    . rivaroxaban (XARELTO) 10 MG TABS tablet Take 1 tablet (10 mg total) by mouth daily. 21 tablet 0  . traMADol (ULTRAM-ER) 100 MG 24 hr tablet Take 100 mg by mouth at bedtime.    . traZODone (DESYREL) 100 MG tablet Take 100 mg by mouth at bedtime.      . baclofen (LIORESAL) 10 MG tablet Take 1 tablet (10 mg total) by mouth 3 (three) times daily. As needed for muscle spasm (Patient not taking: Reported on 05/01/2016) 50 tablet 0  . budesonide (ENTOCORT EC) 3 MG 24 hr capsule Take 3 capsules (9 mg total) by mouth daily. (Patient not taking: Reported on 05/01/2016) 90 capsule 0  . budesonide (ENTOCORT EC) 3 MG 24 hr capsule Take 2 capsules daily for 2 weeks, then 1 capsule daily for 1 week, then 1 capsule every other day for a week and stop. (Patient  not taking: Reported on 05/01/2016) 24 capsule 0  . budesonide (ENTOCORT EC) 3 MG 24 hr capsule Take 2 capsules (6 mg total) by mouth daily. (Patient not taking: Reported on 05/01/2016) 24 capsule 0  . DULoxetine (CYMBALTA) 60 MG capsule Take 60 mg by mouth daily.      . Mesalamine-Cleanser (ROWASA) 4 g KIT Place 1 kit (4 g total) rectally at bedtime. (Patient not taking: Reported on 05/01/2016) 30 each 1  . NEXIUM 40 MG capsule TAKE 1 CAPSULE DAILY BEFORE BREAKFAST (Patient not taking: Reported on 05/01/2016) 90 capsule 3  . ondansetron (ZOFRAN) 4 MG tablet Take 1 tablet (4 mg total) by mouth every 8 (eight) hours as needed for nausea or vomiting. (Patient not taking: Reported on 05/01/2016) 30 tablet 0  . oxyCODONE-acetaminophen (PERCOCET) 10-325 MG tablet Take 1-2 tablets by mouth every 6 (six) hours as needed for pain. MAXIMUM TOTAL ACETAMINOPHEN DOSE IS 4000 MG PER DAY (Patient not taking: Reported on 01/22/2016) 50 tablet 0   No current facility-administered medications for this visit.     Allergies as of 05/01/2016 - Review Complete 05/01/2016  Allergen Reaction Noted  . Mesalamine Other (See Comments)   . Cefprozil Other (See Comments)   . Cefuroxime axetil Other (See Comments) 08/08/2008  . Morphine and related Hives and Itching 08/23/2012  . Penicillins Hives 08/08/2008  . Aspirin Other (See Comments)   . Chicken allergy Nausea And Vomiting and Rash 11/27/2015  . Clarithromycin Nausea And Vomiting 08/08/2008  . Codeine Other (See Comments)   . Entex Other (See Comments) 08/08/2008    Family History  Problem Relation Age of Onset  . Colon cancer Neg Hx     Social History   Social History  . Marital status: Married    Spouse name: N/A  . Number of children: 3  . Years of education: N/A   Occupational History  . 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 Smoking status: Former Smoker    Packs/day: 1.00    Years: 30.00    Types: Cigarettes    Start date:  07/07/1969    Quit date: 08/29/2013  . Smokeless tobacco: Never  Used  . Alcohol use No  . Drug use: No  . Sexual activity: Yes    Partners: Male    Birth control/ protection: None     Comment: spouse   Other Topics Concern  . None   Social History Narrative  . None    Review of Systems: As mentioned in HPI   Physical Exam: BP 120/65   Pulse 92   Temp 98.8 F (37.1 C) (Oral)   Ht 5' 4"  (1.626 m)   Wt 180 lb 9.6 oz (81.9 kg)   BMI 31.00 kg/m  General:   Alert and oriented. No distress noted. Pleasant and cooperative.  Head:  Normocephalic and atraumatic. Eyes:  Conjuctiva clear without scleral icterus. Mouth:  Oral mucosa pink and moist. Good dentition. No lesions. Abdomen:  +BS, soft, TTP LLQ and non-distended. No rebound or guarding. No HSM or masses noted. Extremities:  Without edema. Neurologic:  Alert and  oriented x4;  grossly normal neurologically. Psych:  Alert and cooperative. Normal mood and affect.

## 2016-05-02 ENCOUNTER — Telehealth: Payer: Self-pay | Admitting: Internal Medicine

## 2016-05-02 MED ORDER — DICYCLOMINE HCL 10 MG PO CAPS: 10.0000 mg | ORAL_CAPSULE | Freq: Three times a day (TID) | ORAL | 3 refills | Status: DC

## 2016-05-02 NOTE — Telephone Encounter (Signed)
Pt was seen in the office yesterday and called this morning asking about her test results. I told her it normally takes 7-10 business days, but she said they were ordered stat. Please advise if her results are available. [phone] or [phone]

## 2016-05-02 NOTE — Telephone Encounter (Signed)
Called pt. LMOM for a return call.

## 2016-05-02 NOTE — Telephone Encounter (Signed)
Please tell patient I reviewed the CT.  No signs of diverticulitis. She has a large hiatal hernia, but this is not causing the discomfort. No acute abnormalities. She may be having irritable bowel syndrome on top of IBD, and I do think a colonoscopy may be needed in the future. I will need to discuss this with Dr. Gala Romney when he returns. A direct visualization of the colon may tell a different story (with biopsies and pathology) vs what a CT shows.   I am sending in an anti-spasmodic. Take Bentyl with meals and at bedtime as needed. Monitor for dry mouth, cramping. We will be in touch early next week with further recommendations after I discuss with Dr. Gala Romney once he returns.

## 2016-05-02 NOTE — Telephone Encounter (Signed)
Pt called back and is aware of results.

## 2016-05-02 NOTE — Telephone Encounter (Signed)
Routing to Anna

## 2016-05-05 ENCOUNTER — Other Ambulatory Visit: Payer: Self-pay | Admitting: Gastroenterology

## 2016-05-05 ENCOUNTER — Telehealth: Payer: Self-pay | Admitting: Internal Medicine

## 2016-05-05 MED ORDER — DICYCLOMINE HCL 10 MG PO CAPS: 10.0000 mg | ORAL_CAPSULE | Freq: Three times a day (TID) | ORAL | 0 refills | Status: DC

## 2016-05-05 NOTE — Progress Notes (Signed)
Very mild hypokalemia noted. Let's recheck a BMP. May not be correct, or she may need potassium supplementation.

## 2016-05-05 NOTE — Telephone Encounter (Signed)
Patient states she will not [facility] for her stomach cramps for 2 weeks.  Can she have something to take now?

## 2016-05-05 NOTE — Telephone Encounter (Signed)
Per AS- ok to send in rx for bentyl #120 with no refill to her local pharmacy. rx has been sent in to [facility] and the pt is aware.

## 2016-05-06 ENCOUNTER — Other Ambulatory Visit: Payer: Self-pay

## 2016-05-06 ENCOUNTER — Other Ambulatory Visit: Payer: Self-pay | Admitting: Gastroenterology

## 2016-05-06 DIAGNOSIS — E876 Hypokalemia: Secondary | ICD-10-CM

## 2016-05-07 ENCOUNTER — Telehealth: Payer: Self-pay | Admitting: Gastroenterology

## 2016-05-07 ENCOUNTER — Ambulatory Visit (HOSPITAL_COMMUNITY): Payer: Medicare Other

## 2016-05-07 NOTE — Assessment & Plan Note (Signed)
65 year old female with history of UC, recently completing entocort for flare, now with LLQ pain and empirically treated for diverticulitis by PCP recently. Recurrent LLQ pain now, obvious tenderness on exam. CT completed without any obvious abnormalities. Query IBS overlay on top of IBD; however, I question if IBD has progressed any may need direct visualization via colonoscopy in future if no improvement and persistent issues. However, her clinical course is complicated due to need for anticoagulation for DVT in March 2017. Will trial Bentyl for now and have her return in 6 weeks.

## 2016-05-07 NOTE — Telephone Encounter (Signed)
Please have patient return for office follow-up in 6 weeks.

## 2016-05-08 ENCOUNTER — Encounter: Payer: Self-pay | Admitting: Internal Medicine

## 2016-05-08 LAB — BASIC METABOLIC PANEL
BUN: 8 mg/dL (ref 7–25)
CALCIUM: 8.5 mg/dL — AB (ref 8.6–10.4)
CO2: 22 mmol/L (ref 20–31)
Chloride: 106 mmol/L (ref 98–110)
Creat: 0.78 mg/dL (ref 0.50–0.99)
GLUCOSE: 116 mg/dL — AB (ref 65–99)
Potassium: 3.4 mmol/L — ABNORMAL LOW (ref 3.5–5.3)
SODIUM: 141 mmol/L (ref 135–146)

## 2016-05-08 NOTE — Telephone Encounter (Signed)
APPT MADE AND LETTER SENT  °

## 2016-05-08 NOTE — Progress Notes (Signed)
cc'ed to pcp °

## 2016-05-09 DIAGNOSIS — K219 Gastro-esophageal reflux disease without esophagitis: Secondary | ICD-10-CM | POA: Diagnosis not present

## 2016-05-09 DIAGNOSIS — N189 Chronic kidney disease, unspecified: Secondary | ICD-10-CM | POA: Diagnosis not present

## 2016-05-09 DIAGNOSIS — E1165 Type 2 diabetes mellitus with hyperglycemia: Secondary | ICD-10-CM | POA: Diagnosis not present

## 2016-05-09 DIAGNOSIS — E782 Mixed hyperlipidemia: Secondary | ICD-10-CM | POA: Diagnosis not present

## 2016-05-14 DIAGNOSIS — E119 Type 2 diabetes mellitus without complications: Secondary | ICD-10-CM | POA: Diagnosis not present

## 2016-05-14 DIAGNOSIS — K5190 Ulcerative colitis, unspecified, without complications: Secondary | ICD-10-CM | POA: Diagnosis not present

## 2016-05-14 DIAGNOSIS — Z6831 Body mass index (BMI) 31.0-31.9, adult: Secondary | ICD-10-CM | POA: Diagnosis not present

## 2016-05-14 DIAGNOSIS — G4733 Obstructive sleep apnea (adult) (pediatric): Secondary | ICD-10-CM | POA: Diagnosis not present

## 2016-05-14 DIAGNOSIS — J309 Allergic rhinitis, unspecified: Secondary | ICD-10-CM | POA: Diagnosis not present

## 2016-05-14 DIAGNOSIS — K219 Gastro-esophageal reflux disease without esophagitis: Secondary | ICD-10-CM | POA: Diagnosis not present

## 2016-05-14 DIAGNOSIS — Z1389 Encounter for screening for other disorder: Secondary | ICD-10-CM | POA: Diagnosis not present

## 2016-05-14 DIAGNOSIS — J4520 Mild intermittent asthma, uncomplicated: Secondary | ICD-10-CM | POA: Diagnosis not present

## 2016-05-26 ENCOUNTER — Other Ambulatory Visit: Payer: Self-pay

## 2016-06-04 DIAGNOSIS — H1033 Unspecified acute conjunctivitis, bilateral: Secondary | ICD-10-CM | POA: Diagnosis not present

## 2016-06-04 DIAGNOSIS — Z6831 Body mass index (BMI) 31.0-31.9, adult: Secondary | ICD-10-CM | POA: Diagnosis not present

## 2016-06-09 DIAGNOSIS — E119 Type 2 diabetes mellitus without complications: Secondary | ICD-10-CM | POA: Diagnosis not present

## 2016-06-09 DIAGNOSIS — H52223 Regular astigmatism, bilateral: Secondary | ICD-10-CM | POA: Diagnosis not present

## 2016-06-09 DIAGNOSIS — H524 Presbyopia: Secondary | ICD-10-CM | POA: Diagnosis not present

## 2016-06-09 DIAGNOSIS — H5203 Hypermetropia, bilateral: Secondary | ICD-10-CM | POA: Diagnosis not present

## 2016-06-09 DIAGNOSIS — H02201 Unspecified lagophthalmos right upper eyelid: Secondary | ICD-10-CM | POA: Diagnosis not present

## 2016-06-09 DIAGNOSIS — H43813 Vitreous degeneration, bilateral: Secondary | ICD-10-CM | POA: Diagnosis not present

## 2016-06-09 DIAGNOSIS — H2513 Age-related nuclear cataract, bilateral: Secondary | ICD-10-CM | POA: Diagnosis not present

## 2016-06-24 ENCOUNTER — Ambulatory Visit (INDEPENDENT_AMBULATORY_CARE_PROVIDER_SITE_OTHER): Payer: Medicare Other | Admitting: Gastroenterology

## 2016-06-24 VITALS — BP 115/72 | HR 75 | Temp 97.8°F | Ht 64.5 in | Wt 181.2 lb

## 2016-06-24 DIAGNOSIS — K5190 Ulcerative colitis, unspecified, without complications: Secondary | ICD-10-CM | POA: Diagnosis not present

## 2016-06-24 MED ORDER — ESOMEPRAZOLE MAGNESIUM 40 MG PO CPDR: 40.0000 mg | DELAYED_RELEASE_CAPSULE | ORAL | 3 refills | Status: DC

## 2016-06-24 MED ORDER — DICYCLOMINE HCL 10 MG PO CAPS: 10.0000 mg | ORAL_CAPSULE | Freq: Three times a day (TID) | ORAL | 3 refills | Status: DC

## 2016-06-24 NOTE — Patient Instructions (Signed)
We will see you back in January 2018!   Please call with any concerns in the meantime.

## 2016-06-24 NOTE — Progress Notes (Signed)
cc'ed to pcp °

## 2016-06-24 NOTE — Progress Notes (Signed)
Referring Provider: Riley Lam Primary Care Physician:  Jalene Mullet, PA-C  Primary GI: Dr. Gala Romney   Chief Complaint  Patient presents with  . Follow-up    HPI:   [name] is a 65 y.o. female presenting today with a history of IBD (segmental colitis), with last colonoscopy in 2015. History of UC, with colonoscopy in 2015 and presentation atypical UC. Started on anticoagulation for DVT in March 2017, with onset of hematochezia and anemia. Hgb much improved when last checked in April (from 9 to 11 range). Stool studies negative for infectious process, and she was started on entocort for suspected flare. Will be on anticoagulation till Jan 2018. Was treated for presumed diverticulitis July 4th by PCP. Has been having issues with LLQ discomfort, with CT completed without any obvious abnormalities. Bentyl prescribed and need for consideration of repeat colonoscopy has been raised. Clinical course is complicated due to need for anticoagulation since DVT in March 2017.   Taking Bentyl about 3 times a day. Sometimes forgets to get the middle of the day in. Still does well if doesn't get the middle dose in. Has been to the bathroom a lot today but it is "one of those days". A good day for her is a BM 2-3 times. A bad day would be 7 or 8 times. Good days outnumber the bad days. Bentyl seems to help with the abdominal cramping. Without the Bentyl, the cramping will make her eyes water. Notices a correlation with certain types of foods. [place] fries, anything a little bit greasy will set off looser stool. Broccoli will set it off as well. Feels much better. Weight is stable.   Past Medical History:  Diagnosis Date  . Anemia   . Anginal pain ([place])    PATIENT STATES DOCT THINKS IS FIBROMYALGIA  . Arthritis   . Asthma   . Cancer ([place])    1980  CERVICAL  . Chronic back pain   . COPD (chronic obstructive pulmonary disease) ([place])   . DDD (degenerative disc disease)   . Degenerative tear  of posterior horn of lateral meniscus of right knee 01/27/2014  . Depression   . Diabetes mellitus without complication ([place])   . Diverticulitis   . Fibromyalgia   . GERD (gastroesophageal reflux disease)   . Headache    HX MIGRAINES    . Hiatal hernia   . HOH (hard of hearing)    right  . Pancreatitis   . Pneumonia    1 YR  . PONV (postoperative nausea and vomiting)    only happened after knee arthroscopy  . Primary localized osteoarthritis of right knee 12/11/2015  . PVC's (premature ventricular contractions)   . Schatzki's ring   . Shortness of breath dyspnea    WITH EXERTION   . Sleep apnea    CPAP at night  . Ulcerative colitis 2007  . Wears dentures    full top-partial bottom    Past Surgical History:  Procedure Laterality Date  . arch and foot    . CERVICAL CONE BIOPSY    . CHOLECYSTECTOMY    . COLONOSCOPY  12/03/05   Rourk-marked inflammatory changes of the rectum, left colon, pan colitis, internal hemorrhoids, sigmoid diverticula  . COLONOSCOPY N/A 10/20/2013   Dr. Rourk:Colonic diverticulosis. colonic mucosal ulcerations involving segment of sigmoid colon, atypical for UC. sigmoid colon path with chronic mildly active colitis consistent with IBD, other biopsies including ascending, transverse, descending colon and rectum unremarkable.   Marland Kitchen  DILATION AND CURETTAGE OF UTERUS    . ESOPHAGEAL DILATION N/A 09/20/2015   Procedure: ESOPHAGEAL DILATION;  Surgeon: Daneil Dolin, MD;  [facility];  Service: Endoscopy;  Laterality: N/A;  . ESOPHAGOGASTRODUODENOSCOPY  01/28/2011   Rourk- Schatzki ring, s/p 41F, otherwise normal/Hiatal hernia otherwise normal stomach D1 and D2  . ESOPHAGOGASTRODUODENOSCOPY  11/30/08   Rourk-Schatzki's ring status post dilation, hiatal hernia  . ESOPHAGOGASTRODUODENOSCOPY  07/12/2007   Dilation with 56 French, Schatzki's ring  . ESOPHAGOGASTRODUODENOSCOPY N/A 08/10/2014   Dr.Rourk- prominent schatzki's ring s/p maloney dilation and  disruption, moderate sized hiatal hernia  . ESOPHAGOGASTRODUODENOSCOPY N/A 09/20/2015   RMR: Schatzki's ring status post dilation as described above. Rather large hiatal hernia; somewhat baggy atonic esophageal body' otherwise normal EGD  . ESOPHAGOGASTRODUODENOSCOPY (EGD) WITH ESOPHAGEAL DILATION  09/01/2012   Dr. Gala Romney- schatzki's ring, hiatal hernia  . FOOT ARTHRODESIS  [id number]   both feet  . HERNIA REPAIR    . JOINT REPLACEMENT    . KNEE ARTHROSCOPY WITH LATERAL MENISECTOMY Right 01/27/2014   Procedure: RIGHT KNEE ARTHROSCOPY WITH PARTIAL LATERAL MENISECTOMY, ;  Surgeon: Johnny Bridge, MD;  Location: [facility];  Service: Orthopedics;  Laterality: Right;  . MALONEY DILATION N/A 08/10/2014   Procedure: [name] DILATION;  Surgeon: Daneil Dolin, MD;  [facility]: [facility];  Service: Endoscopy;  Laterality: N/A;  . SAVORY DILATION N/A 08/10/2014   Procedure: SAVORY DILATION;  Surgeon: Daneil Dolin, MD;  [facility]: [facility];  Service: Endoscopy;  Laterality: N/A;  . SHOULDER OPEN ROTATOR CUFF REPAIR     BILAT  . TONSILLECTOMY    . TOTAL KNEE ARTHROPLASTY Right 12/11/2015   Procedure: TOTAL KNEE ARTHROPLASTY;  Surgeon: Marchia Bond, MD;  Location: [facility];  Service: Orthopedics;  Laterality: Right;    Current Outpatient Prescriptions  Medication Sig Dispense Refill  . ADVAIR DISKUS 250-50 MCG/DOSE AEPB Inhale 1 puff into the lungs daily.    Marland Kitchen albuterol (PROVENTIL) (2.5 MG/3ML) 0.083% nebulizer solution Take 2.5 mg by nebulization every 6 (six) hours as needed for wheezing or shortness of breath.    Marland Kitchen albuterol (PROVENTIL,VENTOLIN) 90 MCG/ACT inhaler Inhale 2 puffs into the lungs every 4 (four) hours as needed for wheezing or shortness of breath.     Marland Kitchen azelastine (ASTELIN) 137 MCG/SPRAY nasal spray 2 sprays by Nasal route 2 (two) times daily. Use in each nostril as directed     . balsalazide (COLAZAL) 750 MG capsule Take 3 capsules (2,250 mg total) by mouth 2 (two)  times daily. 540 capsule 3  . buPROPion (WELLBUTRIN XL) 150 MG 24 hr tablet Take 150 mg by mouth daily.    . busPIRone (BUSPAR) 15 MG tablet Take 7.5 mg by mouth 2 (two) times daily. 1/2 tablet bid    . Calcium Carb-Cholecalciferol (CALCIUM-VITAMIN D) 600-400 MG-UNIT TABS Take 1 tablet by mouth 2 (two) times daily.    . cetirizine (ZYRTEC) 10 MG tablet Take 10 mg by mouth daily.    Marland Kitchen dicyclomine (BENTYL) 10 MG capsule Take 1 capsule (10 mg total) by mouth 4 (four) times daily -  before meals and at bedtime. 120 capsule 3  . dicyclomine (BENTYL) 10 MG capsule Take 1 capsule (10 mg total) by mouth 4 (four) times daily -  before meals and at bedtime. 120 capsule 0  . DULoxetine (CYMBALTA) 30 MG capsule Take 30 mg by mouth daily.    . DULoxetine (CYMBALTA) 60 MG capsule Take 60 mg by  mouth daily.      Marland Kitchen esomeprazole (NEXIUM) 40 MG capsule Take 1 capsule (40 mg total) by mouth every morning. 90 capsule 3  . famotidine-calcium carbonate-magnesium hydroxide (PEPCID COMPLETE) 10-800-165 MG CHEW chewable tablet Chew 1 tablet by mouth daily as needed (for breakthrough GERD).     . ferrous gluconate (FERGON) 225 (27 FE) MG tablet Take 240 mg by mouth daily.     Marland Kitchen ketotifen (ALAWAY) 0.025 % ophthalmic solution Place 2 drops into both eyes 2 (two) times daily.    . metFORMIN (GLUCOPHAGE-XR) 500 MG 24 hr tablet Take 1,000 mg by mouth every evening.     . methylcellulose (CITRUCEL) oral powder Take 1 packet by mouth 2 (two) times daily.    . miconazole (NEOSPORIN AF) 2 % cream Apply 1 application topically daily as needed (for wound care).    Marland Kitchen NEXIUM 40 MG capsule TAKE 1 CAPSULE DAILY BEFORE BREAKFAST 90 capsule 3  . omeprazole (PRILOSEC) 20 MG capsule Take 1 capsule (20 mg total) by mouth daily. 90 capsule 3  . pravastatin (PRAVACHOL) 20 MG tablet TAKE 1 TABLET EVERY EVENING 90 tablet 2  . pregabalin (LYRICA) 100 MG capsule Take 100 mg by mouth 2 (two) times daily.    . rivaroxaban (XARELTO) 10 MG TABS tablet  Take 1 tablet (10 mg total) by mouth daily. 21 tablet 0  . traMADol (ULTRAM-ER) 100 MG 24 hr tablet Take 100 mg by mouth at bedtime.    . traZODone (DESYREL) 100 MG tablet Take 100 mg by mouth at bedtime.      Marland Kitchen HYDROcodone-acetaminophen (NORCO) 5-325 MG tablet Take 1 tablet by mouth every 6 (six) hours as needed for moderate pain. (Patient not taking: Reported on 06/24/2016) 15 tablet 0  . ondansetron (ZOFRAN) 4 MG tablet Take 1 tablet (4 mg total) by mouth every 8 (eight) hours as needed for nausea or vomiting. (Patient not taking: Reported on 06/24/2016) 30 tablet 0  . oxyCODONE-acetaminophen (PERCOCET) 10-325 MG tablet Take 1-2 tablets by mouth every 6 (six) hours as needed for pain. MAXIMUM TOTAL ACETAMINOPHEN DOSE IS 4000 MG PER DAY (Patient not taking: Reported on 06/24/2016) 50 tablet 0   No current facility-administered medications for this visit.     Allergies as of 06/24/2016 - Review Complete 06/24/2016  Allergen Reaction Noted  . Mesalamine Other (See Comments)   . Cefprozil Other (See Comments)   . Cefuroxime axetil Other (See Comments) 08/08/2008  . Morphine and related Hives and Itching 08/23/2012  . Penicillins Hives 08/08/2008  . Aspirin Other (See Comments)   . Chicken allergy Nausea And Vomiting and Rash 11/27/2015  . Clarithromycin Nausea And Vomiting 08/08/2008  . Codeine Other (See Comments)   . Entex Other (See Comments) 08/08/2008    Family History  Problem Relation Age of Onset  . Colon cancer Neg Hx     Social History   Social History  . Marital status: Married    Spouse name: N/A  . Number of children: 3  . Years of education: N/A   Occupational History  . 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 Smoking status: Former Smoker    Packs/day: 1.00    Years: 30.00    Types: Cigarettes    Start date: 07/07/1969    Quit date: 08/29/2013  . Smokeless tobacco: Never Used  . Alcohol use No  . Drug use: No  . Sexual activity: Yes     Partners: Male  Birth control/ protection: None     Comment: spouse   Other Topics Concern  . Not on file   Social History Narrative  . No narrative on file    Review of Systems: As mentioned in HPI.   Physical Exam: BP 115/72   Pulse 75   Temp 97.8 F (36.6 C) (Oral)   Ht 5' 4.5" (1.638 m)   Wt 181 lb 3.2 oz (82.2 kg)   BMI 30.62 kg/m  General:   Alert and oriented. No distress noted. Pleasant and cooperative.  Head:  Normocephalic and atraumatic. Eyes:  Conjuctiva clear without scleral icterus. Abdomen:  +BS, soft, non-tender and non-distended. No rebound or guarding. No HSM or masses noted. Msk:  Symmetrical without gross deformities. Normal posture. Extremities:  Without edema. Neurologic:  Alert and  oriented x4;  grossly normal neurologically. Psych:  Alert and cooperative. Normal mood and affect.

## 2016-06-24 NOTE — Assessment & Plan Note (Signed)
History of UC, with last colonoscopy in 2015 (presentation atypical for UC), treated for presumed flare earlier this year and dealing with LLQ several months ago. CT negative, and it appears she has done well with supportive treatment in the way of Bentyl. 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 No rectal bleeding, weight loss, or typical flare-type symptoms. Sed rate, CRP normal recently. As she is on anticoagulation, would hold on endoscopic evaluation until Jan 2018, when she is able to come off of this. I feel she should have her lower GI tract re-evaluated to ensure she has not had disease progression, due to the course of events as noted in HPI. For now, she is doing well with supportive therapy. We will have her return in a few months to discuss. Continue Bentyl prn and Colazal as she is doing.

## 2016-06-27 DIAGNOSIS — J0100 Acute maxillary sinusitis, unspecified: Secondary | ICD-10-CM | POA: Diagnosis not present

## 2016-06-27 DIAGNOSIS — Z6831 Body mass index (BMI) 31.0-31.9, adult: Secondary | ICD-10-CM | POA: Diagnosis not present

## 2016-06-27 DIAGNOSIS — J309 Allergic rhinitis, unspecified: Secondary | ICD-10-CM | POA: Diagnosis not present

## 2016-06-30 ENCOUNTER — Other Ambulatory Visit: Payer: Self-pay | Admitting: Cardiology

## 2016-07-16 DIAGNOSIS — Z6831 Body mass index (BMI) 31.0-31.9, adult: Secondary | ICD-10-CM | POA: Diagnosis not present

## 2016-07-16 DIAGNOSIS — R05 Cough: Secondary | ICD-10-CM | POA: Diagnosis not present

## 2016-07-24 ENCOUNTER — Telehealth: Payer: Self-pay | Admitting: Internal Medicine

## 2016-07-24 DIAGNOSIS — Z23 Encounter for immunization: Secondary | ICD-10-CM | POA: Diagnosis not present

## 2016-07-24 NOTE — Telephone Encounter (Signed)
Pt needs a 90 day supply refill [facility]       [phone]

## 2016-07-24 NOTE — Telephone Encounter (Signed)
Routing to the refill box. 

## 2016-07-25 MED ORDER — BALSALAZIDE DISODIUM 750 MG PO CAPS: 2250.0000 mg | ORAL_CAPSULE | Freq: Two times a day (BID) | ORAL | 3 refills | Status: DC

## 2016-07-25 NOTE — Telephone Encounter (Signed)
Done

## 2016-07-25 NOTE — Addendum Note (Signed)
Addended by: Annitta Needs on: 07/25/2016 11:44 AM   Modules accepted: Orders

## 2016-08-04 DIAGNOSIS — M1712 Unilateral primary osteoarthritis, left knee: Secondary | ICD-10-CM | POA: Diagnosis not present

## 2016-08-08 DIAGNOSIS — E782 Mixed hyperlipidemia: Secondary | ICD-10-CM | POA: Diagnosis not present

## 2016-08-08 DIAGNOSIS — E1165 Type 2 diabetes mellitus with hyperglycemia: Secondary | ICD-10-CM | POA: Diagnosis not present

## 2016-08-08 DIAGNOSIS — K219 Gastro-esophageal reflux disease without esophagitis: Secondary | ICD-10-CM | POA: Diagnosis not present

## 2016-08-08 DIAGNOSIS — N189 Chronic kidney disease, unspecified: Secondary | ICD-10-CM | POA: Diagnosis not present

## 2016-08-12 DIAGNOSIS — F329 Major depressive disorder, single episode, unspecified: Secondary | ICD-10-CM | POA: Diagnosis not present

## 2016-08-12 DIAGNOSIS — E119 Type 2 diabetes mellitus without complications: Secondary | ICD-10-CM | POA: Diagnosis not present

## 2016-08-12 DIAGNOSIS — K5190 Ulcerative colitis, unspecified, without complications: Secondary | ICD-10-CM | POA: Diagnosis not present

## 2016-08-12 DIAGNOSIS — J309 Allergic rhinitis, unspecified: Secondary | ICD-10-CM | POA: Diagnosis not present

## 2016-08-12 DIAGNOSIS — Z6831 Body mass index (BMI) 31.0-31.9, adult: Secondary | ICD-10-CM | POA: Diagnosis not present

## 2016-08-12 DIAGNOSIS — K219 Gastro-esophageal reflux disease without esophagitis: Secondary | ICD-10-CM | POA: Diagnosis not present

## 2016-08-12 DIAGNOSIS — G4733 Obstructive sleep apnea (adult) (pediatric): Secondary | ICD-10-CM | POA: Diagnosis not present

## 2016-08-12 DIAGNOSIS — J4520 Mild intermittent asthma, uncomplicated: Secondary | ICD-10-CM | POA: Diagnosis not present

## 2016-08-19 ENCOUNTER — Ambulatory Visit (INDEPENDENT_AMBULATORY_CARE_PROVIDER_SITE_OTHER): Payer: Medicare Other | Admitting: Cardiology

## 2016-08-19 ENCOUNTER — Encounter: Payer: Self-pay | Admitting: Cardiology

## 2016-08-19 VITALS — BP 135/74 | HR 81 | Ht 64.0 in | Wt 182.2 lb

## 2016-08-19 DIAGNOSIS — E782 Mixed hyperlipidemia: Secondary | ICD-10-CM

## 2016-08-19 DIAGNOSIS — R0789 Other chest pain: Secondary | ICD-10-CM | POA: Diagnosis not present

## 2016-08-19 MED ORDER — PRAVASTATIN SODIUM 20 MG PO TABS: 20.0000 mg | ORAL_TABLET | Freq: Every evening | ORAL | 3 refills | Status: DC

## 2016-08-19 NOTE — Progress Notes (Signed)
Clinical Summary [name] is a 65 y.o.female seen today for follow up of the following medical problems.   1. Chest pain - history of atypical chest pain.  - 06/2014 Lexiscan MPI without ischemia - she has had prior EGDs with dilatation as well that has previously improved her symptoms. .   - still with occsaional chest pain. Unchanged since last visit.   2. Hyperlipidemia - tolerating pravastatin well, previous muscle aches on lipitor -07/2016 TC 157 TG 159 HDL 45 LDL 80  3. Ulcerating colitis - followed by GI   4. 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 On xarelto currently   Past Medical History:  Diagnosis Date  . Anemia   . Anginal pain ([place])    PATIENT STATES DOCT THINKS IS FIBROMYALGIA  . Arthritis   . Asthma   . Cancer ([place])    1980  CERVICAL  . Chronic back pain   . COPD (chronic obstructive pulmonary disease) ([place])   . DDD (degenerative disc disease)   . Degenerative tear of posterior horn of lateral meniscus of right knee 01/27/2014  . Depression   . Diabetes mellitus without complication ([place])   . Diverticulitis   . Fibromyalgia   . GERD (gastroesophageal reflux disease)   . Headache    HX MIGRAINES    . Hiatal hernia   . HOH (hard of hearing)    right  . Pancreatitis   . Pneumonia    1 YR  . PONV (postoperative nausea and vomiting)    only happened after knee arthroscopy  . Primary localized osteoarthritis of right knee 12/11/2015  . PVC's (premature ventricular contractions)   . Schatzki's ring   . Shortness of breath dyspnea    WITH EXERTION   . Sleep apnea    CPAP at night  . Ulcerative colitis 2007  . Wears dentures    full top-partial bottom     Allergies  Allergen Reactions  . Mesalamine Other (See Comments)    Pancreatitis   . Cefprozil Other (See Comments)    Aggravates Ulcerative colitis   . Cefuroxime Axetil Other (See Comments)    Aggravates Ulcerative colitis   . Morphine And Related Hives and Itching  . Penicillins  Hives    Has patient had a PCN reaction causing immediate rash, facial/tongue/throat swelling, SOB or lightheadedness with hypotension: No Has patient had a PCN reaction causing severe rash involving mucus membranes or skin necrosis: No Has patient had a PCN reaction that required hospitalization No Has patient had a PCN reaction occurring within the last 10 years: yes If all of the above answers are "NO", then may proceed with Cephalosporin use.   . Aspirin Other (See Comments)    Affects the central nervous system. "shakey"  . Chicken Allergy Nausea And Vomiting and Rash  . Clarithromycin Nausea And Vomiting  . Codeine Other (See Comments)    Hallucinations/bad dreams.  . Entex Other (See Comments)    insomnia     Current Outpatient Prescriptions  Medication Sig Dispense Refill  . ADVAIR DISKUS 250-50 MCG/DOSE AEPB Inhale 1 puff into the lungs daily.    Marland Kitchen albuterol (PROVENTIL) (2.5 MG/3ML) 0.083% nebulizer solution Take 2.5 mg by nebulization every 6 (six) hours as needed for wheezing or shortness of breath.    Marland Kitchen albuterol (PROVENTIL,VENTOLIN) 90 MCG/ACT inhaler Inhale 2 puffs into the lungs every 4 (four) hours as needed for wheezing or shortness of breath.     Marland Kitchen azelastine (ASTELIN) 137 MCG/SPRAY  nasal spray 2 sprays by Nasal route 2 (two) times daily. Use in each nostril as directed     . balsalazide (COLAZAL) 750 MG capsule Take 3 capsules (2,250 mg total) by mouth 2 (two) times daily. 540 capsule 3  . buPROPion (WELLBUTRIN XL) 150 MG 24 hr tablet Take 150 mg by mouth daily.    . busPIRone (BUSPAR) 15 MG tablet Take 7.5 mg by mouth 2 (two) times daily. 1/2 tablet bid    . Calcium Carb-Cholecalciferol (CALCIUM-VITAMIN D) 600-400 MG-UNIT TABS Take 1 tablet by mouth 2 (two) times daily.    . cetirizine (ZYRTEC) 10 MG tablet Take 10 mg by mouth daily.    Marland Kitchen dicyclomine (BENTYL) 10 MG capsule Take 1 capsule (10 mg total) by mouth 4 (four) times daily -  before meals and at bedtime. 120  capsule 3  . dicyclomine (BENTYL) 10 MG capsule Take 1 capsule (10 mg total) by mouth 4 (four) times daily -  before meals and at bedtime. 360 capsule 3  . DULoxetine (CYMBALTA) 30 MG capsule Take 30 mg by mouth daily.    . DULoxetine (CYMBALTA) 60 MG capsule Take 60 mg by mouth daily.      Marland Kitchen esomeprazole (NEXIUM) 40 MG capsule Take 1 capsule (40 mg total) by mouth every morning. 90 capsule 3  . famotidine-calcium carbonate-magnesium hydroxide (PEPCID COMPLETE) 10-800-165 MG CHEW chewable tablet Chew 1 tablet by mouth daily as needed (for breakthrough GERD).     . ferrous gluconate (FERGON) 225 (27 FE) MG tablet Take 240 mg by mouth daily.     Marland Kitchen HYDROcodone-acetaminophen (NORCO) 5-325 MG tablet Take 1 tablet by mouth every 6 (six) hours as needed for moderate pain. (Patient not taking: Reported on 06/24/2016) 15 tablet 0  . ketotifen (ALAWAY) 0.025 % ophthalmic solution Place 2 drops into both eyes 2 (two) times daily.    . metFORMIN (GLUCOPHAGE-XR) 500 MG 24 hr tablet Take 1,000 mg by mouth every evening.     . methylcellulose (CITRUCEL) oral powder Take 1 packet by mouth 2 (two) times daily.    . miconazole (NEOSPORIN AF) 2 % cream Apply 1 application topically daily as needed (for wound care).    Marland Kitchen NEXIUM 40 MG capsule TAKE 1 CAPSULE DAILY BEFORE BREAKFAST 90 capsule 3  . omeprazole (PRILOSEC) 20 MG capsule Take 1 capsule (20 mg total) by mouth daily. 90 capsule 3  . ondansetron (ZOFRAN) 4 MG tablet Take 1 tablet (4 mg total) by mouth every 8 (eight) hours as needed for nausea or vomiting. (Patient not taking: Reported on 06/24/2016) 30 tablet 0  . oxyCODONE-acetaminophen (PERCOCET) 10-325 MG tablet Take 1-2 tablets by mouth every 6 (six) hours as needed for pain. MAXIMUM TOTAL ACETAMINOPHEN DOSE IS 4000 MG PER DAY (Patient not taking: Reported on 06/24/2016) 50 tablet 0  . pravastatin (PRAVACHOL) 20 MG tablet TAKE 1 TABLET EVERY EVENING 30 tablet 0  . pregabalin (LYRICA) 100 MG capsule Take 100 mg  by mouth 2 (two) times daily.    . rivaroxaban (XARELTO) 10 MG TABS tablet Take 1 tablet (10 mg total) by mouth daily. 21 tablet 0  . traMADol (ULTRAM-ER) 100 MG 24 hr tablet Take 100 mg by mouth at bedtime.    . traZODone (DESYREL) 100 MG tablet Take 100 mg by mouth at bedtime.       No current facility-administered medications for this visit.      Past Surgical History:  Procedure Laterality Date  . arch and foot    .  CERVICAL CONE BIOPSY    . CHOLECYSTECTOMY    . COLONOSCOPY  12/03/05   Rourk-marked inflammatory changes of the rectum, left colon, pan colitis, internal hemorrhoids, sigmoid diverticula  . COLONOSCOPY N/A 10/20/2013   Dr. Rourk:Colonic diverticulosis. colonic mucosal ulcerations involving segment of sigmoid colon, atypical for UC. sigmoid colon path with chronic mildly active colitis consistent with IBD, other biopsies including ascending, transverse, descending colon and rectum unremarkable.   Marland Kitchen DILATION AND CURETTAGE OF UTERUS    . ESOPHAGEAL DILATION N/A 09/20/2015   Procedure: ESOPHAGEAL DILATION;  Surgeon: Daneil Dolin, MD;  [facility];  Service: Endoscopy;  Laterality: N/A;  . ESOPHAGOGASTRODUODENOSCOPY  01/28/2011   Rourk- Schatzki ring, s/p 82F, otherwise normal/Hiatal hernia otherwise normal stomach D1 and D2  . ESOPHAGOGASTRODUODENOSCOPY  11/30/08   Rourk-Schatzki's ring status post dilation, hiatal hernia  . ESOPHAGOGASTRODUODENOSCOPY  07/12/2007   Dilation with 56 French, Schatzki's ring  . ESOPHAGOGASTRODUODENOSCOPY N/A 08/10/2014   Dr.Rourk- prominent schatzki's ring s/p maloney dilation and disruption, moderate sized hiatal hernia  . ESOPHAGOGASTRODUODENOSCOPY N/A 09/20/2015   RMR: Schatzki's ring status post dilation as described above. Rather large hiatal hernia; somewhat baggy atonic esophageal body' otherwise normal EGD  . ESOPHAGOGASTRODUODENOSCOPY (EGD) WITH ESOPHAGEAL DILATION  09/01/2012   Dr. Gala Romney- schatzki's ring, hiatal hernia  .  FOOT ARTHRODESIS  [id number]   both feet  . HERNIA REPAIR    . JOINT REPLACEMENT    . KNEE ARTHROSCOPY WITH LATERAL MENISECTOMY Right 01/27/2014   Procedure: RIGHT KNEE ARTHROSCOPY WITH PARTIAL LATERAL MENISECTOMY, ;  Surgeon: Johnny Bridge, MD;  Location: [facility];  Service: Orthopedics;  Laterality: Right;  . MALONEY DILATION N/A 08/10/2014   Procedure: [name] DILATION;  Surgeon: Daneil Dolin, MD;  [facility];  Service: Endoscopy;  Laterality: N/A;  . SAVORY DILATION N/A 08/10/2014   Procedure: SAVORY DILATION;  Surgeon: Daneil Dolin, MD;  [facility];  Service: Endoscopy;  Laterality: N/A;  . SHOULDER OPEN ROTATOR CUFF REPAIR     BILAT  . TONSILLECTOMY    . TOTAL KNEE ARTHROPLASTY Right 12/11/2015   Procedure: TOTAL KNEE ARTHROPLASTY;  Surgeon: Marchia Bond, MD;  Location: [facility];  Service: Orthopedics;  Laterality: Right;     Allergies  Allergen Reactions  . Mesalamine Other (See Comments)    Pancreatitis   . Cefprozil Other (See Comments)    Aggravates Ulcerative colitis   . Cefuroxime Axetil Other (See Comments)    Aggravates Ulcerative colitis   . Morphine And Related Hives and Itching  . Penicillins Hives    Has patient had a PCN reaction causing immediate rash, facial/tongue/throat swelling, SOB or lightheadedness with hypotension: No Has patient had a PCN reaction causing severe rash involving mucus membranes or skin necrosis: No Has patient had a PCN reaction that required hospitalization No Has patient had a PCN reaction occurring within the last 10 years: yes If all of the above answers are "NO", then may proceed with Cephalosporin use.   . Aspirin Other (See Comments)    Affects the central nervous system. "shakey"  . Chicken Allergy Nausea And Vomiting and Rash  . Clarithromycin Nausea And Vomiting  . Codeine Other (See Comments)    Hallucinations/bad dreams.  . Entex Other (See Comments)    insomnia      Family  History  Problem Relation Age of Onset  . Colon cancer Neg Hx      Social History [name] reports that  she quit smoking about 2 years ago. Her smoking use included Cigarettes. She started smoking about 47 years ago. She has a 30.00 pack-year smoking history. She has never used smokeless tobacco. [name] reports that she does not drink alcohol.   Review of Systems CONSTITUTIONAL: No weight loss, fever, chills, weakness or fatigue.  HEENT: Eyes: No visual loss, blurred vision, double vision or yellow sclerae.No hearing loss, sneezing, congestion, runny nose or sore throat.  SKIN: No rash or itching.  CARDIOVASCULAR: per hpi RESPIRATORY: No shortness of breath, cough or sputum.  GASTROINTESTINAL: No anorexia, nausea, vomiting or diarrhea. No abdominal pain or blood.  GENITOURINARY: No burning on urination, no polyuria NEUROLOGICAL: No headache, dizziness, syncope, paralysis, ataxia, numbness or tingling in the extremities. No change in bowel or bladder control.  MUSCULOSKELETAL: No muscle, back pain, joint pain or stiffness.  LYMPHATICS: No enlarged nodes. No history of splenectomy.  PSYCHIATRIC: No history of depression or anxiety.  ENDOCRINOLOGIC: No reports of sweating, cold or heat intolerance. No polyuria or polydipsia.  Marland Kitchen   Physical Examination Vitals:   08/19/16 1528  BP: 135/74  Pulse: 81   Vitals:   08/19/16 1528  Weight: 182 lb 3.2 oz (82.6 kg)  Height: 5' 4"  (1.626 m)    Gen: resting comfortably, no acute distress HEENT: no scleral icterus, pupils equal round and reactive, no palptable cervical adenopathy,  CV: RRR, no m/r/g, no jvd Resp: Clear to auscultation bilaterally GI: abdomen is soft, non-tender, non-distended, normal bowel sounds, no hepatosplenomegaly MSK: extremities are warm, no edema.  Skin: warm, no rash Neuro:  no focal deficits Psych: appropriate affect   Diagnostic Studies 06/2014 Lexiscan MPI IMPRESSION: 1. No reversible ischemia  or infarction.  2. Normal left ventricular wall motion.  3. Left ventricular ejection fraction 60%  4. Low-risk stress test findings*.   07/2014 Echo Study Conclusions  - Left ventricle: The cavity size was normal. Wall thickness was normal. Systolic function was normal. The estimated ejection fraction was in the range of 60% to 65%. - Aortic valve: Valve area (VTI): 3.31 cm^2. Valve area (Vmax): 3.27 cm^2. Valve area (Vmean): 2.77 cm^2. - Left atrium: The atrium was mildly dilated. - Technically difficult study.    Assessment and Plan  1. Chest pain - atypical symptoms that are unchanged. Negative stress test recently - we will continue to monitor  2. Hyperlipidemia - at goal. Previous side effects on lipitor, tolerating pravastatin well.  - continue statin  3. DVT - continue xarelto per pcp   F/u 1 year       Arnoldo Lenis, M.D.

## 2016-08-19 NOTE — Patient Instructions (Signed)

## 2016-08-27 DIAGNOSIS — J0111 Acute recurrent frontal sinusitis: Secondary | ICD-10-CM | POA: Diagnosis not present

## 2016-08-27 DIAGNOSIS — Z6830 Body mass index (BMI) 30.0-30.9, adult: Secondary | ICD-10-CM | POA: Diagnosis not present

## 2016-08-27 DIAGNOSIS — J069 Acute upper respiratory infection, unspecified: Secondary | ICD-10-CM | POA: Diagnosis not present

## 2016-09-01 DIAGNOSIS — M1712 Unilateral primary osteoarthritis, left knee: Secondary | ICD-10-CM | POA: Diagnosis not present

## 2016-09-08 DIAGNOSIS — M1712 Unilateral primary osteoarthritis, left knee: Secondary | ICD-10-CM | POA: Diagnosis not present

## 2016-09-15 DIAGNOSIS — M1712 Unilateral primary osteoarthritis, left knee: Secondary | ICD-10-CM | POA: Diagnosis not present

## 2016-10-10 DIAGNOSIS — Z6831 Body mass index (BMI) 31.0-31.9, adult: Secondary | ICD-10-CM | POA: Diagnosis not present

## 2016-10-10 DIAGNOSIS — J019 Acute sinusitis, unspecified: Secondary | ICD-10-CM | POA: Diagnosis not present

## 2016-10-10 DIAGNOSIS — R05 Cough: Secondary | ICD-10-CM | POA: Diagnosis not present

## 2016-10-24 ENCOUNTER — Other Ambulatory Visit: Payer: Self-pay

## 2016-10-24 ENCOUNTER — Ambulatory Visit (INDEPENDENT_AMBULATORY_CARE_PROVIDER_SITE_OTHER): Payer: Medicare Other | Admitting: Gastroenterology

## 2016-10-24 ENCOUNTER — Encounter: Payer: Self-pay | Admitting: Gastroenterology

## 2016-10-24 VITALS — BP 119/69 | HR 82 | Temp 97.6°F | Ht 64.0 in | Wt 181.8 lb

## 2016-10-24 DIAGNOSIS — K625 Hemorrhage of anus and rectum: Secondary | ICD-10-CM

## 2016-10-24 DIAGNOSIS — R1310 Dysphagia, unspecified: Secondary | ICD-10-CM

## 2016-10-24 DIAGNOSIS — R1319 Other dysphagia: Secondary | ICD-10-CM

## 2016-10-24 DIAGNOSIS — K5180 Other ulcerative colitis without complications: Secondary | ICD-10-CM

## 2016-10-24 DIAGNOSIS — K5190 Ulcerative colitis, unspecified, without complications: Secondary | ICD-10-CM

## 2016-10-24 MED ORDER — PEG 3350-KCL-NA BICARB-NACL 420 G PO SOLR: 4000.0000 mL | ORAL | 0 refills | Status: DC

## 2016-10-24 NOTE — Progress Notes (Signed)
Referring Provider: Riley Lam Primary Care Physician:  Jalene Mullet, PA-C Primary GI: Dr. Gala Romney   Chief Complaint  Patient presents with  . Dysphagia    HPI:   [name] is a 66 y.o. female presenting today with a history of  IBD (segmental colitis), with last colonoscopy in 2015. History of UC, with colonoscopy in 2015 and presentation atypical UC. Started on anticoagulation for DVT in March 2017, with onset of hematochezia and anemia. Hgb much improved when last checked in April (from 9 to 11 range). Stool studies negative for infectious process, and she was started on entocort for suspected flare. Will be on anticoagulation through Jan 2018 and recently received a letter from PCP that patient may stop Xarelto. Has been having issues with LLQ discomfort, with CT completed without any obvious abnormalities   She has done well with supportive treatment in the way of Bentyl, as it seems she has an IBS overlay with certain foods triggering symptoms.  She has had intermittent low-volume rectal bleeding in setting of anticoagulation but not felt to be in persistent flare. None recently. Still with occasional LLQ discomfort. Diarrhea may comes and go. Sometimes constipated, sometimes not. Taking Bentyl 2-3 times a day, which seems to help with LLQ discomfort and frequency of stool.   Starting 2 weeks ago, she noted recurrent solid food dysphagia. No odynophagia. Difficulties with meats, rice. Occasional issues with bigger pills to include calcium and Colazal.  No decreased appetite. Has noted historical improvement after dilations in the past. Last EGD Dec 2016 with Schatzki's ring s/p dilation, large hiatal hernia. Known esophageal dysmotility.   Outside labs from Nov 2017: Tbili 0.2, Alk Phos 69, AST 13, ALT 9. Cr 0.88. No CBC.   Past Medical History:  Diagnosis Date  . Anemia   . Anginal pain ([place])    PATIENT STATES DOCT THINKS IS FIBROMYALGIA  . Arthritis   . Asthma   .  Cancer ([place])    1980  CERVICAL  . Chronic back pain   . COPD (chronic obstructive pulmonary disease) ([place])   . DDD (degenerative disc disease)   . Degenerative tear of posterior horn of lateral meniscus of right knee 01/27/2014  . Depression   . Diabetes mellitus without complication ([place])   . Diverticulitis   . Fibromyalgia   . GERD (gastroesophageal reflux disease)   . Headache    HX MIGRAINES    . Hiatal hernia   . HOH (hard of hearing)    right  . Pancreatitis   . Pneumonia    1 YR  . PONV (postoperative nausea and vomiting)    only happened after knee arthroscopy  . Primary localized osteoarthritis of right knee 12/11/2015  . PVC's (premature ventricular contractions)   . Schatzki's ring   . Shortness of breath dyspnea    WITH EXERTION   . Sleep apnea    CPAP at night  . Ulcerative colitis 2007  . Wears dentures    full top-partial bottom    Past Surgical History:  Procedure Laterality Date  . arch and foot    . CERVICAL CONE BIOPSY    . CHOLECYSTECTOMY    . COLONOSCOPY  12/03/05   Rourk-marked inflammatory changes of the rectum, left colon, pan colitis, internal hemorrhoids, sigmoid diverticula  . COLONOSCOPY N/A 10/20/2013   Dr. Rourk:Colonic diverticulosis. colonic mucosal ulcerations involving segment of sigmoid colon, atypical for UC. sigmoid colon path with chronic mildly active colitis consistent  with IBD, other biopsies including ascending, transverse, descending colon and rectum unremarkable.   Marland Kitchen DILATION AND CURETTAGE OF UTERUS    . ESOPHAGEAL DILATION N/A 09/20/2015   Procedure: ESOPHAGEAL DILATION;  Surgeon: Daneil Dolin, MD;  [facility];  Service: Endoscopy;  Laterality: N/A;  . ESOPHAGOGASTRODUODENOSCOPY  01/28/2011   Rourk- Schatzki ring, s/p 63F, otherwise normal/Hiatal hernia otherwise normal stomach D1 and D2  . ESOPHAGOGASTRODUODENOSCOPY  11/30/08   Rourk-Schatzki's ring status post dilation, hiatal hernia  . ESOPHAGOGASTRODUODENOSCOPY   07/12/2007   Dilation with 56 French, Schatzki's ring  . ESOPHAGOGASTRODUODENOSCOPY N/A 08/10/2014   Dr.Rourk- prominent schatzki's ring s/p maloney dilation and disruption, moderate sized hiatal hernia  . ESOPHAGOGASTRODUODENOSCOPY N/A 09/20/2015   RMR: Schatzki's ring status post dilation as described above. Rather large hiatal hernia; somewhat baggy atonic esophageal body' otherwise normal EGD  . ESOPHAGOGASTRODUODENOSCOPY (EGD) WITH ESOPHAGEAL DILATION  09/01/2012   Dr. Gala Romney- schatzki's ring, hiatal hernia  . FOOT ARTHRODESIS  [id number]   both feet  . HERNIA REPAIR    . JOINT REPLACEMENT    . KNEE ARTHROSCOPY WITH LATERAL MENISECTOMY Right 01/27/2014   Procedure: RIGHT KNEE ARTHROSCOPY WITH PARTIAL LATERAL MENISECTOMY, ;  Surgeon: Johnny Bridge, MD;  Location: [facility];  Service: Orthopedics;  Laterality: Right;  . MALONEY DILATION N/A 08/10/2014   Procedure: [name] DILATION;  Surgeon: Daneil Dolin, MD;  [facility];  Service: Endoscopy;  Laterality: N/A;  . SAVORY DILATION N/A 08/10/2014   Procedure: SAVORY DILATION;  Surgeon: Daneil Dolin, MD;  [facility];  Service: Endoscopy;  Laterality: N/A;  . SHOULDER OPEN ROTATOR CUFF REPAIR     BILAT  . TONSILLECTOMY    . TOTAL KNEE ARTHROPLASTY Right 12/11/2015   Procedure: TOTAL KNEE ARTHROPLASTY;  Surgeon: Marchia Bond, MD;  Location: [facility];  Service: Orthopedics;  Laterality: Right;    Current Outpatient Prescriptions  Medication Sig Dispense Refill  . ADVAIR DISKUS 250-50 MCG/DOSE AEPB Inhale 1 puff into the lungs daily.    Marland Kitchen albuterol (PROVENTIL) (2.5 MG/3ML) 0.083% nebulizer solution Take 2.5 mg by nebulization every 6 (six) hours as needed for wheezing or shortness of breath.    Marland Kitchen albuterol (PROVENTIL,VENTOLIN) 90 MCG/ACT inhaler Inhale 2 puffs into the lungs every 4 (four) hours as needed for wheezing or shortness of breath.     Marland Kitchen azelastine (ASTELIN) 137 MCG/SPRAY nasal spray 2 sprays  by Nasal route 2 (two) times daily. Use in each nostril as directed     . balsalazide (COLAZAL) 750 MG capsule Take 3 capsules (2,250 mg total) by mouth 2 (two) times daily. 540 capsule 3  . buPROPion (WELLBUTRIN XL) 150 MG 24 hr tablet Take 150 mg by mouth daily.    . busPIRone (BUSPAR) 15 MG tablet Take 7.5 mg by mouth 2 (two) times daily. 1/2 tablet bid    . Calcium Carb-Cholecalciferol (CALCIUM-VITAMIN D) 600-400 MG-UNIT TABS Take 1 tablet by mouth 2 (two) times daily.    . cetirizine (ZYRTEC) 10 MG tablet Take 10 mg by mouth daily.    Marland Kitchen dicyclomine (BENTYL) 10 MG capsule Take 1 capsule (10 mg total) by mouth 4 (four) times daily -  before meals and at bedtime. 360 capsule 3  . DULoxetine (CYMBALTA) 30 MG capsule Take 90 mg by mouth daily.     Marland Kitchen esomeprazole (NEXIUM) 40 MG capsule Take 1 capsule (40 mg total) by mouth every morning. 90 capsule 3  . famotidine-calcium carbonate-magnesium  hydroxide (PEPCID COMPLETE) 10-800-165 MG CHEW chewable tablet Chew 1 tablet by mouth daily as needed (for breakthrough GERD).     . ferrous gluconate (FERGON) 225 (27 FE) MG tablet Take 240 mg by mouth daily.     Marland Kitchen ketotifen (ALAWAY) 0.025 % ophthalmic solution Place 2 drops into both eyes 2 (two) times daily.    . metFORMIN (GLUCOPHAGE-XR) 500 MG 24 hr tablet Take 1,000 mg by mouth every evening.     . methylcellulose (CITRUCEL) oral powder Take 1 packet by mouth 2 (two) times daily.    . miconazole (NEOSPORIN AF) 2 % cream Apply 1 application topically daily as needed (for wound care).    Marland Kitchen NEXIUM 40 MG capsule TAKE 1 CAPSULE DAILY BEFORE BREAKFAST 90 capsule 3  . pravastatin (PRAVACHOL) 20 MG tablet Take 1 tablet (20 mg total) by mouth every evening. 90 tablet 3  . pregabalin (LYRICA) 100 MG capsule Take 100 mg by mouth 2 (two) times daily.    . rivaroxaban (XARELTO) 20 MG TABS tablet Take 20 mg by mouth daily with supper.    . traMADol (ULTRAM-ER) 100 MG 24 hr tablet Take 100 mg by mouth at bedtime.    .  traZODone (DESYREL) 100 MG tablet Take 100 mg by mouth at bedtime.       No current facility-administered medications for this visit.     Allergies as of 10/24/2016 - Review Complete 10/24/2016  Allergen Reaction Noted  . Mesalamine Other (See Comments)   . Cefprozil Other (See Comments)   . Cefuroxime axetil Other (See Comments) 08/08/2008  . Morphine and related Hives and Itching 08/23/2012  . Penicillins Hives 08/08/2008  . Aspirin Other (See Comments)   . Chicken allergy Nausea And Vomiting and Rash 11/27/2015  . Clarithromycin Nausea And Vomiting 08/08/2008  . Codeine Other (See Comments)   . Entex Other (See Comments) 08/08/2008    Family History  Problem Relation Age of Onset  . Colon cancer Neg Hx     Social History   Social History  . Marital status: Married    Spouse name: N/A  . Number of children: 3  . Years of education: N/A   Occupational History  . 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 Smoking status: Former Smoker    Packs/day: 1.00    Years: 30.00    Types: Cigarettes    Start date: 07/07/1969    Quit date: 08/29/2013  . Smokeless tobacco: Never Used  . Alcohol use No  . Drug use: No  . Sexual activity: Yes    Partners: Male    Birth control/ protection: None     Comment: spouse   Other Topics Concern  . None   Social History Narrative  . None    Review of Systems: As mentioned in HPI   Physical Exam: BP 119/69   Pulse 82   Temp 97.6 F (36.4 C) (Oral)   Ht 5' 4"  (1.626 m)   Wt 181 lb 12.8 oz (82.5 kg)   BMI 31.21 kg/m  General:   Alert and oriented. No distress noted. Pleasant and cooperative.  Head:  Normocephalic and atraumatic. Eyes:  Conjuctiva clear without scleral icterus. Mouth:  Oral mucosa pink and moist. Good dentition. No lesions. Heart:  S1, S2 present without murmurs, rubs, or gallops. Regular rate and rhythm. Abdomen:  +BS, soft, non-tender and non-distended. No rebound or guarding. No HSM  or masses noted. Msk:  Symmetrical without  gross deformities. Normal posture. Extremities:  Without edema. Neurologic:  Alert and  oriented x4;  grossly normal neurologically. Psych:  Alert and cooperative. Normal mood and affect.

## 2016-10-24 NOTE — Patient Instructions (Signed)
We have scheduled you for a colonoscopy, upper endoscopy, and dilation with Dr. Gala Romney in the near future.   We are asking your primary care doctor if we can hold the Xarelto for 48 hours prior.   Do not take any diabetes medication the day of the procedure.   Stop taking iron 7 days before the procedure.

## 2016-10-27 DIAGNOSIS — M25511 Pain in right shoulder: Secondary | ICD-10-CM | POA: Diagnosis not present

## 2016-10-27 DIAGNOSIS — M5030 Other cervical disc degeneration, unspecified cervical region: Secondary | ICD-10-CM | POA: Diagnosis not present

## 2016-10-30 ENCOUNTER — Telehealth: Payer: Self-pay

## 2016-10-30 NOTE — Telephone Encounter (Signed)
Fax had been sent to [name] PA-C office in regards to holding Xarelto prior to upcoming TCS/EGD. Received fax back stating "she should stop Xarelto, she was scheduled to be done with it 1/18 anyway". Called and informed pt.

## 2016-10-31 NOTE — Assessment & Plan Note (Signed)
New onset with history of Schatzki's ring s/p dilation, last in 2015. Multiple dilations in the past with good response. Known motility disorder.   Proceed with upper endoscopy/dilation in the near future with Dr. Gala Romney. The risks, benefits, and alternatives have been discussed in detail with patient. They have stated understanding and desire to proceed.  Phenergan 12.5 mg IV on call  Continue Nexium daily

## 2016-10-31 NOTE — Assessment & Plan Note (Signed)
66 year old with last colonoscopy in 2015, rectal bleeding last year in setting of anticoagulation and felt to be related to possible flare. Chronic LLQ pain and negative CT, with improvement in symptoms with Bentyl. No recent rectal bleeding but has noted low-volume hematochezia off and on in setting of anticoagulation and no other signs of typical flares. As last colonoscopy in 2015, discussed early interval colonoscopy to assess for any changes in disease progression. 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 She will complete anticoagulation therapy end of Jan 2018, and her PCP has advised her to discontinue. We will proceed with colonoscopy in near future for diagnostic purposes.   Proceed with TCS with Dr. Gala Romney in near future: the risks, benefits, and alternatives have been discussed with the patient in detail. The patient states understanding and desires to proceed. Phenergan 12.5 mg IV on call  Continue Colazal

## 2016-11-03 NOTE — Progress Notes (Signed)
CC'D TO PCP °

## 2016-11-07 DIAGNOSIS — E1165 Type 2 diabetes mellitus with hyperglycemia: Secondary | ICD-10-CM | POA: Diagnosis not present

## 2016-11-07 DIAGNOSIS — I2699 Other pulmonary embolism without acute cor pulmonale: Secondary | ICD-10-CM | POA: Diagnosis not present

## 2016-11-07 DIAGNOSIS — N189 Chronic kidney disease, unspecified: Secondary | ICD-10-CM | POA: Diagnosis not present

## 2016-11-07 DIAGNOSIS — E782 Mixed hyperlipidemia: Secondary | ICD-10-CM | POA: Diagnosis not present

## 2016-11-07 DIAGNOSIS — E7800 Pure hypercholesterolemia, unspecified: Secondary | ICD-10-CM | POA: Diagnosis not present

## 2016-11-12 DIAGNOSIS — E119 Type 2 diabetes mellitus without complications: Secondary | ICD-10-CM | POA: Diagnosis not present

## 2016-11-12 DIAGNOSIS — J309 Allergic rhinitis, unspecified: Secondary | ICD-10-CM | POA: Diagnosis not present

## 2016-11-12 DIAGNOSIS — K5190 Ulcerative colitis, unspecified, without complications: Secondary | ICD-10-CM | POA: Diagnosis not present

## 2016-11-12 DIAGNOSIS — G4733 Obstructive sleep apnea (adult) (pediatric): Secondary | ICD-10-CM | POA: Diagnosis not present

## 2016-11-12 DIAGNOSIS — K219 Gastro-esophageal reflux disease without esophagitis: Secondary | ICD-10-CM | POA: Diagnosis not present

## 2016-11-12 DIAGNOSIS — F329 Major depressive disorder, single episode, unspecified: Secondary | ICD-10-CM | POA: Diagnosis not present

## 2016-11-12 DIAGNOSIS — Z1389 Encounter for screening for other disorder: Secondary | ICD-10-CM | POA: Diagnosis not present

## 2016-11-12 DIAGNOSIS — J4520 Mild intermittent asthma, uncomplicated: Secondary | ICD-10-CM | POA: Diagnosis not present

## 2016-11-19 ENCOUNTER — Ambulatory Visit (HOSPITAL_COMMUNITY)
Admission: RE | Admit: 2016-11-19 | Discharge: 2016-11-19 | Disposition: A | Discharge status: Home Health | Payer: Medicare Other | Source: Ambulatory Visit | Attending: Internal Medicine | Admitting: Internal Medicine

## 2016-11-19 ENCOUNTER — Encounter (HOSPITAL_COMMUNITY): Payer: Self-pay

## 2016-11-19 ENCOUNTER — Encounter (HOSPITAL_COMMUNITY)
Admission: RE | Disposition: A | Discharge status: Home Health | Payer: Self-pay | Source: Ambulatory Visit | Attending: Internal Medicine

## 2016-11-19 DIAGNOSIS — Z888 Allergy status to other drugs, medicaments and biological substances status: Secondary | ICD-10-CM | POA: Diagnosis not present

## 2016-11-19 DIAGNOSIS — K640 First degree hemorrhoids: Secondary | ICD-10-CM | POA: Diagnosis not present

## 2016-11-19 DIAGNOSIS — K219 Gastro-esophageal reflux disease without esophagitis: Secondary | ICD-10-CM | POA: Diagnosis not present

## 2016-11-19 DIAGNOSIS — E119 Type 2 diabetes mellitus without complications: Secondary | ICD-10-CM | POA: Diagnosis not present

## 2016-11-19 DIAGNOSIS — K921 Melena: Secondary | ICD-10-CM | POA: Diagnosis not present

## 2016-11-19 DIAGNOSIS — Z7901 Long term (current) use of anticoagulants: Secondary | ICD-10-CM | POA: Diagnosis not present

## 2016-11-19 DIAGNOSIS — G4730 Sleep apnea, unspecified: Secondary | ICD-10-CM | POA: Diagnosis not present

## 2016-11-19 DIAGNOSIS — Z880 Allergy status to penicillin: Secondary | ICD-10-CM | POA: Diagnosis not present

## 2016-11-19 DIAGNOSIS — K5190 Ulcerative colitis, unspecified, without complications: Secondary | ICD-10-CM | POA: Insufficient documentation

## 2016-11-19 DIAGNOSIS — K224 Dyskinesia of esophagus: Secondary | ICD-10-CM | POA: Diagnosis not present

## 2016-11-19 DIAGNOSIS — Z79899 Other long term (current) drug therapy: Secondary | ICD-10-CM | POA: Diagnosis not present

## 2016-11-19 DIAGNOSIS — Z96651 Presence of right artificial knee joint: Secondary | ICD-10-CM | POA: Insufficient documentation

## 2016-11-19 DIAGNOSIS — J449 Chronic obstructive pulmonary disease, unspecified: Secondary | ICD-10-CM | POA: Diagnosis not present

## 2016-11-19 DIAGNOSIS — Z886 Allergy status to analgesic agent status: Secondary | ICD-10-CM | POA: Diagnosis not present

## 2016-11-19 DIAGNOSIS — Z7984 Long term (current) use of oral hypoglycemic drugs: Secondary | ICD-10-CM | POA: Insufficient documentation

## 2016-11-19 DIAGNOSIS — Z881 Allergy status to other antibiotic agents status: Secondary | ICD-10-CM | POA: Diagnosis not present

## 2016-11-19 DIAGNOSIS — K635 Polyp of colon: Secondary | ICD-10-CM

## 2016-11-19 DIAGNOSIS — M797 Fibromyalgia: Secondary | ICD-10-CM | POA: Insufficient documentation

## 2016-11-19 DIAGNOSIS — R1310 Dysphagia, unspecified: Secondary | ICD-10-CM | POA: Diagnosis not present

## 2016-11-19 DIAGNOSIS — D649 Anemia, unspecified: Secondary | ICD-10-CM | POA: Diagnosis not present

## 2016-11-19 DIAGNOSIS — Z87891 Personal history of nicotine dependence: Secondary | ICD-10-CM | POA: Insufficient documentation

## 2016-11-19 DIAGNOSIS — K449 Diaphragmatic hernia without obstruction or gangrene: Secondary | ICD-10-CM | POA: Diagnosis not present

## 2016-11-19 DIAGNOSIS — Z86718 Personal history of other venous thrombosis and embolism: Secondary | ICD-10-CM | POA: Diagnosis not present

## 2016-11-19 DIAGNOSIS — K5730 Diverticulosis of large intestine without perforation or abscess without bleeding: Secondary | ICD-10-CM | POA: Diagnosis not present

## 2016-11-19 DIAGNOSIS — K222 Esophageal obstruction: Secondary | ICD-10-CM | POA: Insufficient documentation

## 2016-11-19 DIAGNOSIS — K625 Hemorrhage of anus and rectum: Secondary | ICD-10-CM

## 2016-11-19 DIAGNOSIS — F329 Major depressive disorder, single episode, unspecified: Secondary | ICD-10-CM | POA: Diagnosis not present

## 2016-11-19 HISTORY — PX: MALONEY DILATION: SHX5535

## 2016-11-19 HISTORY — PX: COLONOSCOPY: SHX5424

## 2016-11-19 HISTORY — PX: ESOPHAGOGASTRODUODENOSCOPY: SHX5428

## 2016-11-19 HISTORY — DX: Acute embolism and thrombosis of unspecified vein: I82.90

## 2016-11-19 LAB — GLUCOSE, CAPILLARY: GLUCOSE-CAPILLARY: 84 mg/dL (ref 65–99)

## 2016-11-19 SURGERY — COLONOSCOPY
Anesthesia: Moderate Sedation

## 2016-11-19 MED ORDER — ONDANSETRON HCL 4 MG/2ML IJ SOLN
INTRAMUSCULAR | Status: DC | PRN
  Administered 2016-11-19: 4 mg via INTRAVENOUS

## 2016-11-19 MED ORDER — PROMETHAZINE HCL 25 MG/ML IJ SOLN
12.5000 mg | Freq: Once | INTRAMUSCULAR | Status: AC
  Administered 2016-11-19: 12.5 mg via INTRAVENOUS

## 2016-11-19 MED ORDER — MIDAZOLAM HCL 5 MG/5ML IJ SOLN
INTRAMUSCULAR | Status: DC | PRN
  Administered 2016-11-19 (×4): 1 mg via INTRAVENOUS
  Administered 2016-11-19: 2 mg via INTRAVENOUS

## 2016-11-19 MED ORDER — MIDAZOLAM HCL 5 MG/5ML IJ SOLN
INTRAMUSCULAR | Status: AC
  Filled 2016-11-19: qty 5

## 2016-11-19 MED ORDER — MEPERIDINE HCL 100 MG/ML IJ SOLN
INTRAMUSCULAR | Status: AC
  Filled 2016-11-19: qty 2

## 2016-11-19 MED ORDER — STERILE WATER FOR IRRIGATION IR SOLN
Status: DC | PRN
  Administered 2016-11-19: 08:00:00

## 2016-11-19 MED ORDER — PROMETHAZINE HCL 25 MG/ML IJ SOLN
INTRAMUSCULAR | Status: AC
  Filled 2016-11-19: qty 1

## 2016-11-19 MED ORDER — SODIUM CHLORIDE 0.9 % IV SOLN
INTRAVENOUS | Status: DC
  Administered 2016-11-19: 07:00:00 via INTRAVENOUS

## 2016-11-19 MED ORDER — MEPERIDINE HCL 100 MG/ML IJ SOLN
INTRAMUSCULAR | Status: DC | PRN
  Administered 2016-11-19 (×2): 25 mg

## 2016-11-19 MED ORDER — SODIUM CHLORIDE 0.9% FLUSH
INTRAVENOUS | Status: AC
  Filled 2016-11-19: qty 10

## 2016-11-19 MED ORDER — ONDANSETRON HCL 4 MG/2ML IJ SOLN
INTRAMUSCULAR | Status: AC
  Filled 2016-11-19: qty 2

## 2016-11-19 MED ORDER — MIDAZOLAM HCL 5 MG/5ML IJ SOLN
INTRAMUSCULAR | Status: AC
  Filled 2016-11-19: qty 10

## 2016-11-19 MED ORDER — LIDOCAINE VISCOUS 2 % MT SOLN
OROMUCOSAL | Status: DC | PRN
  Administered 2016-11-19: 1 via OROMUCOSAL

## 2016-11-19 MED ORDER — LIDOCAINE VISCOUS 2 % MT SOLN
OROMUCOSAL | Status: AC
  Filled 2016-11-19: qty 15

## 2016-11-19 NOTE — Op Note (Signed)
[facility] Patient Name: [name] Procedure Date: 11/19/2016 7:30 AM MRN: [id number] Date of Birth: 1950-10-29 Attending MD: Norvel Richards , MD CSN: [id number] Age: 66 Admit Type: Outpatient Procedure:                Upper GI endoscopy with [name] dilation Indications:              Dysphagia Providers:                Norvel Richards, MD, Janeece Riggers, RN, Isabella Stalling, Technician Referring MD:              Medicines:                Midazolam 3 mg IV, Meperidine 50 mg IV,                            Promethazine 63.0 mg IV Complications:            No immediate complications. Estimated Blood Loss:     Estimated blood loss was minimal. Procedure:                Pre-Anesthesia Assessment:                           - Prior to the procedure, a History and Physical                            was performed, and patient medications and                            allergies were reviewed. The patient's tolerance of                            previous anesthesia was also reviewed. The risks                            and benefits of the procedure and the sedation                            options and risks were discussed with the patient.                            All questions were answered, and informed consent                            was obtained. Prior Anticoagulants: The patient has                            taken no previous anticoagulant or antiplatelet                            agents. ASA Grade Assessment: II - A patient with  mild systemic disease. After reviewing the risks                            and benefits, the patient was deemed in                            satisfactory condition to undergo the procedure.                           After obtaining informed consent, the endoscope was                            passed under direct vision. Throughout the                            procedure, the  patient's blood pressure, pulse, and                            oxygen saturations were monitored continuously. The                            EG-[id number] ([id number]) was introduced through the                            mouth, and advanced to the second part of duodenum.                            The upper GI endoscopy was accomplished without                            difficulty. The patient tolerated the procedure                            well. Scope In: 7:52:55 AM Scope Out: 8:02:56 AM Total Procedure Duration: 0 hours 10 minutes 1 second  Findings:      A moderate Schatzki ring was found at the gastroesophageal junction.       Esophageal mucosa appeared normal otherwise. The scope was withdrawn.       Dilation was performed with a Maloney dilator with no resistance at 56       Fr. The scope was withdrawn. Dilation was performed with a Maloney       dilator with mild resistance at 80 Fr. The dilation site was examined       and showed mild improvement in luminal narrowing. Estimated blood loss       was minimal. jumbo biopsy forceps used to disrupt the ring for the with       three-quarter bites. These maneuvers were done after a diagnostic EGD       was performed.      A large hiatal hernia was present.      The exam was otherwise without abnormality.      The second portion of the duodenum was normal. Impression:               - Moderate Schatzki ring. Dilated / disrupted.                           -  Large hiatal hernia.                           - The examination was otherwise normal.                           - Normal second portion of the duodenum.                           - No specimens collected. Moderate Sedation:      Moderate (conscious) sedation was administered by the endoscopy nurse       and supervised by the endoscopist. The following parameters were       monitored: oxygen saturation, heart rate, blood pressure, respiratory       rate, EKG, adequacy of pulmonary  ventilation, and response to care.       Total physician intraservice time was 21 minutes. Recommendation:           - Patient has a contact number available for                            emergencies. The signs and symptoms of potential                            delayed complications were discussed with the                            patient. Return to normal activities tomorrow.                            Written discharge instructions were provided to the                            patient.                           - Resume previous diet.                           - Continue present medications.                           - No repeat upper endoscopy.                           - Return to GI office (date not yet determined). Procedure Code(s):        --- Professional ---                           [phone], Esophagogastroduodenoscopy, flexible,                            transoral; diagnostic, including collection of                            specimen(s) by brushing or washing, when performed                            (  separate procedure)                           43450, Dilation of esophagus, by unguided sound or                            bougie, single or multiple passes                           99152, Moderate sedation services provided by the                            same physician or other qualified health care                            professional performing the diagnostic or                            therapeutic service that the sedation supports,                            requiring the presence of an independent trained                            observer to assist in the monitoring of the                            patient's level of consciousness and physiological                            status; initial 15 minutes of intraservice time,                            patient age 18 years or older Diagnosis Code(s):        --- Professional ---                            K22.2, Esophageal obstruction                           K44.9, Diaphragmatic hernia without obstruction or                            gangrene                           R13.10, Dysphagia, unspecified CPT copyright 2016 American [employer]. All rights reserved. The codes documented in this report are preliminary and upon coder review may  be revised to meet current compliance requirements. [name]. Anaiyah Anglemyer, MD Norvel Richards, MD 11/19/2016 8:52:27 AM This report has been signed electronically. Number of Addenda: 0

## 2016-11-19 NOTE — Op Note (Signed)
[facility] Patient Name: [name] Procedure Date: 11/19/2016 8:03 AM MRN: [id number] Date of Birth: October 11, 1950 Attending MD: Norvel Richards , MD CSN: [id number] Age: 66 Admit Type: Outpatient Procedure:                Colonoscopy ith biopsy and snare polypectomy Indications:              Hematochezia Providers:                Norvel Richards, MD, Janeece Riggers, RN, Isabella Stalling, Technician Referring MD:              Medicines:                Midazolam 6 mg IV, Meperidine 50 mg IV,                            Promethazine 12.5 mg IV, Ondansetron 4 mg IV Complications:            No immediate complications. Estimated Blood Loss:     Estimated blood loss was minimal. Procedure:                Pre-Anesthesia Assessment:                           - Prior to the procedure, a History and Physical                            was performed, and patient medications and                            allergies were reviewed. The patient's tolerance of                            previous anesthesia was also reviewed. The risks                            and benefits of the procedure and the sedation                            options and risks were discussed with the patient.                            All questions were answered, and informed consent                            was obtained. Prior Anticoagulants: The patient has                            taken no previous anticoagulant or antiplatelet                            agents. ASA Grade Assessment: II - A patient with  mild systemic disease. After reviewing the risks                            and benefits, the patient was deemed in                            satisfactory condition to undergo the procedure.                           - Prior to the procedure, a History and Physical                            was performed, and patient medications and             allergies were reviewed. The patient's tolerance of                            previous anesthesia was also reviewed. The risks                            and benefits of the procedure and the sedation                            options and risks were discussed with the patient.                            All questions were answered, and informed consent                            was obtained. Prior Anticoagulants: The patient has                            taken no previous anticoagulant or antiplatelet                            agents. ASA Grade Assessment: II - A patient with                            mild systemic disease. After reviewing the risks                            and benefits, the patient was deemed in                            satisfactory condition to undergo the procedure.                           After obtaining informed consent, the colonoscope                            was passed under direct vision. Throughout the                            procedure, the patient's blood  pressure, pulse, and                            oxygen saturations were monitored continuously. The                            EC-[id number] ([id number]) scope was introduced through                            the anus and advanced to the the cecum, identified                            by appendiceal orifice and ileocecal valve. The                            patient tolerated the procedure well. The quality                            of the bowel preparation was adequate. The entire                            colon was well visualized. The ileocecal valve,                            appendiceal orifice, and rectum were photographed. Scope In: 8:08:40 AM Scope Out: 8:38:18 AM Scope Withdrawal Time: 0 hours 20 minutes 41 seconds  Total Procedure Duration: 0 hours 29 minutes 38 seconds  Findings:      The perianal and digital rectal examinations were normal.      Many small and large-mouthed  diverticula were found in the sigmoid colon       and descending colon.      A 7 mm polyp was found in the descending colon. The polyp was       semi-pedunculated. The polyp was removed with a hot snare. Resection and       retrieval were complete. Estimated blood loss: none. Redundant fold       present. Did not appear to be a polyp but did stand out. It was cold       biopsied. The remainder of the colon appeared normal. Segmental biopsie       of the ascending, descending and rectal segments taken.      The exam was otherwise without abnormality on direct and retroflexion       views.      Internal hemorrhoids were found during retroflexion. The hemorrhoids       were Grade I (internal hemorrhoids that do not prolapse).      The exam was otherwise without abnormality on direct and retroflexion       views. Impression:               - Diverticulosis in the sigmoid colon and in the                            descending colon. Segmental biopsies taken.                           - One  7 mm polyp in the descending colon, removed                            with a hot snare. Resected and retrieved.                           - The examination was otherwise normal on direct                            and retroflexion views.                           - Internal hemorrhoids.                           - The examination was otherwise normal on direct                            and retroflexion views. Moderate Sedation:      Moderate (conscious) sedation was administered by the endoscopy nurse       and supervised by the endoscopist. The following parameters were       monitored: oxygen saturation, heart rate, blood pressure, respiratory       rate, EKG, adequacy of pulmonary ventilation, and response to care.       Total physician intraservice time was 60 minutes. Recommendation:           - Patient has a contact number available for                            emergencies. The signs and symptoms  of potential                            delayed complications were discussed with the                            patient. Return to normal activities tomorrow.                            Written discharge instructions were provided to the                            patient.                           - Resume previous diet.                           - Continue present medications.                           - Repeat colonoscopy date to be determined after                            pending pathology results are reviewed for  surveillance based on pathology results.                           - Return to GI clinic (date not yet determined).                            See EGD report. Procedure Code(s):        --- Professional ---                           [phone], Colonoscopy, flexible; with removal of                            tumor(s), polyp(s), or other lesion(s) by snare                            technique                           99152, Moderate sedation services provided by the                            same physician or other qualified health care                            professional performing the diagnostic or                            therapeutic service that the sedation supports,                            requiring the presence of an independent trained                            observer to assist in the monitoring of the                            patient's level of consciousness and physiological                            status; initial 15 minutes of intraservice time,                            patient age 23 years or older                           [phone], Moderate sedation services; each additional                            15 minutes intraservice time                           99153, Moderate sedation services; each additional                            15  minutes intraservice time                           99153, Moderate sedation  services; each additional                            15 minutes intraservice time Diagnosis Code(s):        --- Professional ---                           K64.0, First degree hemorrhoids                           D12.4, Benign neoplasm of descending colon                           K92.1, Melena (includes Hematochezia)                           K57.30, Diverticulosis of large intestine without                            perforation or abscess without bleeding CPT copyright 2016 American Medical Association. All rights reserved. The codes documented in this report are preliminary and upon coder review may  be revised to meet current compliance requirements. [name]. Bhavesh Vazquez, MD Norvel Richards, MD 11/19/2016 9:01:04 AM This report has been signed electronically. Number of Addenda: 0

## 2016-11-19 NOTE — Interval H&P Note (Signed)
History and Physical Interval Note:  11/19/2016 7:34 AM  [name]  has presented today for surgery, with the diagnosis of dysphagia, rectal bleeding, ulcerative colitits  The various methods of treatment have been discussed with the patient and family. After consideration of risks, benefits and other options for treatment, the patient has consented to  Procedure(s) with comments: COLONOSCOPY (N/A) - 7:30 AM ESOPHAGOGASTRODUODENOSCOPY (EGD) (N/A) MALONEY DILATION (N/A) as a surgical intervention .  The patient's history has been reviewed, patient examined, no change in status, stable for surgery.  I have reviewed the patient's chart and labs.  Questions were answered to the patient's satisfaction.     [name]  Patient permanently Off Xarelto . No change. EGD with ED as feasible/appropriate. Plus diagnostic colonoscopy per plan.   The risks, benefits, limitations, imponderables and alternatives regarding both EGD and colonoscopy have been reviewed with the patient. Questions have been answered. All parties agreeable.

## 2016-11-19 NOTE — Discharge Instructions (Addendum)
EGD Discharge instructions Please read the instructions outlined below and refer to this sheet in the next few weeks. These discharge instructions provide you with general information on caring for yourself after you leave the hospital. Your doctor may also give you specific instructions. While your treatment has been planned according to the most current medical practices available, unavoidable complications occasionally occur. If you have any problems or questions after discharge, please call your doctor. ACTIVITY  You may resume your regular activity but move at a slower pace for the next 24 hours.   Take frequent rest periods for the next 24 hours.   Walking will help expel (get rid of) the air and reduce the bloated feeling in your abdomen.   No driving for 24 hours (because of the anesthesia (medicine) used during the test).   You may shower.   Do not sign any important legal documents or operate any machinery for 24 hours (because of the anesthesia used during the test).  NUTRITION  Drink plenty of fluids.   You may resume your normal diet.   Begin with a light meal and progress to your normal diet.   Avoid alcoholic beverages for 24 hours or as instructed by your caregiver.  MEDICATIONS  You may resume your normal medications unless your caregiver tells you otherwise.  WHAT YOU CAN EXPECT TODAY  You may experience abdominal discomfort such as a feeling of fullness or gas pains.  FOLLOW-UP  Your doctor will discuss the results of your test with you.  SEEK IMMEDIATE MEDICAL ATTENTION IF ANY OF THE FOLLOWING OCCUR:  Excessive nausea (feeling sick to your stomach) and/or vomiting.   Severe abdominal pain and distention (swelling).   Trouble swallowing.   Temperature over 101 F (37.8 C).   Rectal bleeding or vomiting of blood.    Colonoscopy Discharge Instructions  Read the instructions outlined below and refer to this sheet in the next few weeks. These  discharge instructions provide you with general information on caring for yourself after you leave the hospital. Your doctor may also give you specific instructions. While your treatment has been planned according to the most current medical practices available, unavoidable complications occasionally occur. If you have any problems or questions after discharge, call Dr. Gala Romney at [phone]. ACTIVITY  You may resume your regular activity, but move at a slower pace for the next 24 hours.   Take frequent rest periods for the next 24 hours.   Walking will help get rid of the air and reduce the bloated feeling in your belly (abdomen).   No driving for 24 hours (because of the medicine (anesthesia) used during the test).    Do not sign any important legal documents or operate any machinery for 24 hours (because of the anesthesia used during the test).  NUTRITION  Drink plenty of fluids.   You may resume your normal diet as instructed by your doctor.   Begin with a light meal and progress to your normal diet. Heavy or fried foods are harder to digest and may make you feel sick to your stomach (nauseated).   Avoid alcoholic beverages for 24 hours or as instructed.  MEDICATIONS  You may resume your normal medications unless your doctor tells you otherwise.  WHAT YOU CAN EXPECT TODAY  Some feelings of bloating in the abdomen.   Passage of more gas than usual.   Spotting of blood in your stool or on the toilet paper.  IF YOU HAD POLYPS REMOVED DURING THE  COLONOSCOPY:  No aspirin products for 7 days or as instructed.   No alcohol for 7 days or as instructed.   Eat a soft diet for the next 24 hours.  FINDING OUT THE RESULTS OF YOUR TEST Not all test results are available during your visit. If your test results are not back during the visit, make an appointment with your caregiver to find out the results. Do not assume everything is normal if you have not heard from your caregiver or the  medical facility. It is important for you to follow up on all of your test results.  SEEK IMMEDIATE MEDICAL ATTENTION IF:  You have more than a spotting of blood in your stool.   Your belly is swollen (abdominal distention).   You are nauseated or vomiting.   You have a temperature over 101.   You have abdominal pain or discomfort that is severe or gets worse throughout the day.   GERD, diverticulosis, polyp and hemorrhoid information provided  Continue Nexium daily  Further recommendations to follow pending review of pathology report   Gastroesophageal Reflux Disease, Adult Normally, food travels down the esophagus and stays in the stomach to be digested. However, when a person has gastroesophageal reflux disease (GERD), food and stomach acid move back up into the esophagus. When this happens, the esophagus becomes sore and inflamed. Over time, GERD can create small holes (ulcers) in the lining of the esophagus. What are the causes? This condition is caused by a problem with the muscle between the esophagus and the stomach (lower esophageal sphincter, or LES). Normally, the LES muscle closes after food passes through the esophagus to the stomach. When the LES is weakened or abnormal, it does not close properly, and that allows food and stomach acid to go back up into the esophagus. The LES can be weakened by certain dietary substances, medicines, and medical conditions, including:  Tobacco use.  Pregnancy.  Having a hiatal hernia.  Heavy alcohol use.  Certain foods and beverages, such as coffee, chocolate, onions, and peppermint. What increases the risk? This condition is more likely to develop in:  People who have an increased body weight.  People who have connective tissue disorders.  People who use NSAID medicines. What are the signs or symptoms? Symptoms of this condition include:  Heartburn.  Difficult or painful swallowing.  The feeling of having a lump in the  throat.  Abitter taste in the mouth.  Bad breath.  Having a large amount of saliva.  Having an upset or bloated stomach.  Belching.  Chest pain.  Shortness of breath or wheezing.  Ongoing (chronic) cough or a night-time cough.  Wearing away of tooth enamel.  Weight loss. Different conditions can cause chest pain. Make sure to see your health care provider if you experience chest pain. How is this diagnosed? Your health care provider will take a medical history and perform a physical exam. To determine if you have mild or severe GERD, your health care provider may also monitor how you respond to treatment. You may also have other tests, including:  An endoscopy toexamine your stomach and esophagus with a small camera.  A test thatmeasures the acidity level in your esophagus.  A test thatmeasures how much pressure is on your esophagus.  A barium swallow or modified barium swallow to show the shape, size, and functioning of your esophagus. How is this treated? The goal of treatment is to help relieve your symptoms and to prevent complications. Treatment for  this condition may vary depending on how severe your symptoms are. Your health care provider may recommend:  Changes to your diet.  Medicine.  Surgery. Follow these instructions at home: Diet  Follow a diet as recommended by your health care provider. This may involve avoiding foods and drinks such as:  Coffee and tea (with or without caffeine).  Drinks that containalcohol.  Energy drinks and sports drinks.  Carbonated drinks or sodas.  Chocolate and cocoa.  Peppermint and mint flavorings.  Garlic and onions.  Horseradish.  Spicy and acidic foods, including peppers, chili powder, curry powder, vinegar, hot sauces, and barbecue sauce.  Citrus fruit juices and citrus fruits, such as oranges, lemons, and limes.  Tomato-based foods, such as red sauce, chili, salsa, and pizza with red sauce.  Fried  and fatty foods, such as donuts, french fries, potato chips, and high-fat dressings.  High-fat meats, such as hot dogs and fatty cuts of red and white meats, such as rib eye steak, sausage, ham, and bacon.  High-fat dairy items, such as whole milk, butter, and cream cheese.  Eat small, frequent meals instead of large meals.  Avoid drinking large amounts of liquid with your meals.  Avoid eating meals during the 2-3 hours before bedtime.  Avoid lying down right after you eat.  Do not exercise right after you eat. General instructions  Pay attention to any changes in your symptoms.  Take over-the-counter and prescription medicines only as told by your health care provider. Do not take aspirin, ibuprofen, or other NSAIDs unless your health care provider told you to do so.  Do not use any tobacco products, including cigarettes, chewing tobacco, and e-cigarettes. If you need help quitting, ask your health care provider.  Wear loose-fitting clothing. Do not wear anything tight around your waist that causes pressure on your abdomen.  Raise (elevate) the head of your bed 6 inches (15cm).  Try to reduce your stress, such as with yoga or meditation. If you need help reducing stress, ask your health care provider.  If you are overweight, reduce your weight to an amount that is healthy for you. Ask your health care provider for guidance about a safe weight loss goal.  Keep all follow-up visits as told by your health care provider. This is important. Contact a health care provider if:  You have new symptoms.  You have unexplained weight loss.  You have difficulty swallowing, or it hurts to swallow.  You have wheezing or a persistent cough.  Your symptoms do not improve with treatment.  You have a hoarse voice. Get help right away if:  You have pain in your arms, neck, jaw, teeth, or back.  You feel sweaty, dizzy, or light-headed.  You have chest pain or shortness of  breath.  You vomit and your vomit looks like blood or coffee grounds.  You faint.  Your stool is bloody or black.  You cannot swallow, drink, or eat.   Diverticulosis Diverticulosis is the condition that develops when small pouches (diverticula) form in the wall of your colon. Your colon, or large intestine, is where water is absorbed and stool is formed. The pouches form when the inside layer of your colon pushes through weak spots in the outer layers of your colon. CAUSES  No one knows exactly what causes diverticulosis. RISK FACTORS  Being older than 16. Your risk for this condition increases with age. Diverticulosis is rare in people younger than 40 years. By age 3, almost everyone has it.  Eating a low-fiber diet.  Being frequently constipated.  Being overweight.  Not getting enough exercise.  Smoking.  Taking over-the-counter pain medicines, like aspirin and ibuprofen. SYMPTOMS  Most people with diverticulosis do not have symptoms. DIAGNOSIS  Because diverticulosis often has no symptoms, health care providers often discover the condition during an exam for other colon problems. In many cases, a health care provider will diagnose diverticulosis while using a flexible scope to examine the colon (colonoscopy). TREATMENT  If you have never developed an infection related to diverticulosis, you may not need treatment. If you have had an infection before, treatment may include:  Eating more fruits, vegetables, and grains.  Taking a fiber supplement.  Taking a live bacteria supplement (probiotic).  Taking medicine to relax your colon. HOME CARE INSTRUCTIONS   Drink at least 6-8 glasses of water each day to prevent constipation.  Try not to strain when you have a bowel movement.  Keep all follow-up appointments. If you have had an infection before:  Increase the fiber in your diet as directed by your health care provider or dietitian.  Take a dietary fiber  supplement if your health care provider approves.  Only take medicines as directed by your health care provider. SEEK MEDICAL CARE IF:   You have abdominal pain.  You have bloating.  You have cramps.  You have not gone to the bathroom in 3 days. SEEK IMMEDIATE MEDICAL CARE IF:   Your pain gets worse.  Yourbloating becomes very bad.  You have a fever or chills, and your symptoms suddenly get worse.  You begin vomiting.  You have bowel movements that are bloody or black. MAKE SURE YOU:  Understand these instructions.  Will watch your condition.  Will get help right away if you are not doing well or get worse.   Colon Polyps Introduction Polyps are tissue growths inside the body. Polyps can grow in many places, including the large intestine (colon). A polyp may be a round bump or a mushroom-shaped growth. You could have one polyp or several. Most colon polyps are noncancerous (benign). However, some colon polyps can become cancerous over time. What are the causes? The exact cause of colon polyps is not known. What increases the risk? This condition is more likely to develop in people who:  Have a family history of colon cancer or colon polyps.  Are older than 58 or older than 45 if they are African American.  Have inflammatory bowel disease, such as ulcerative colitis or Crohn disease.  Are overweight.  Smoke cigarettes.  Do not get enough exercise.  Drink too much alcohol.  Eat a diet that is:  High in fat and red meat.  Low in fiber.  Had childhood cancer that was treated with abdominal radiation. What are the signs or symptoms? Most polyps do not cause symptoms. If you have symptoms, they may include:  Blood coming from your rectum when having a bowel movement.  Blood in your stool.The stool may look dark red or black.  A change in bowel habits, such as constipation or diarrhea. How is this diagnosed? This condition is diagnosed with a  colonoscopy. This is a procedure that uses a lighted, flexible scope to look at the inside of your colon. How is this treated? Treatment for this condition involves removing any polyps that are found. Those polyps will then be tested for cancer. If cancer is found, your health care provider will talk to you about options for colon cancer treatment. Follow  these instructions at home: Diet  Eat plenty of fiber, such as fruits, vegetables, and whole grains.  Eat foods that are high in calcium and vitamin D, such as milk, cheese, yogurt, eggs, liver, fish, and broccoli.  Limit foods high in fat, red meats, and processed meats, such as hot dogs, sausage, bacon, and lunch meats.  Maintain a healthy weight, or lose weight if recommended by your health care provider. General instructions  Do not smoke cigarettes.  Do not drink alcohol excessively.  Keep all follow-up visits as told by your health care provider. This is important. This includes keeping regularly scheduled colonoscopies. Talk to your health care provider about when you need a colonoscopy.  Exercise every day or as told by your health care provider. Contact a health care provider if:  You have new or worsening bleeding during a bowel movement.  You have new or increased blood in your stool.  You have a change in bowel habits.  You unexpectedly lose weight.   Hemorrhoids Hemorrhoids are swollen veins in and around the rectum or anus. There are two types of hemorrhoids:  Internal hemorrhoids. These occur in the veins that are just inside the rectum. They may poke through to the outside and become irritated and painful.  External hemorrhoids. These occur in the veins that are outside of the anus and can be felt as a painful swelling or hard lump near the anus. Most hemorrhoids do not cause serious problems, and they can be managed with home treatments such as diet and lifestyle changes. If home treatments do not help your  symptoms, procedures can be done to shrink or remove the hemorrhoids. What are the causes? This condition is caused by increased pressure in the anal area. This pressure may result from various things, including:  Constipation.  Straining to have a bowel movement.  Diarrhea.  Pregnancy.  Obesity.  Sitting for long periods of time.  Heavy lifting or other activity that causes you to strain.  Anal sex. What are the signs or symptoms? Symptoms of this condition include:  Pain.  Anal itching or irritation.  Rectal bleeding.  Leakage of stool (feces).  Anal swelling.  One or more lumps around the anus. How is this diagnosed? This condition can often be diagnosed through a visual exam. Other exams or tests may also be done, such as:  Examination of the rectal area with a gloved hand (digital rectal exam).  Examination of the anal canal using a small tube (anoscope).  A blood test, if you have lost a significant amount of blood.  A test to look inside the colon (sigmoidoscopy or colonoscopy). How is this treated? This condition can usually be treated at home. However, various procedures may be done if dietary changes, lifestyle changes, and other home treatments do not help your symptoms. These procedures can help make the hemorrhoids smaller or remove them completely. Some of these procedures involve surgery, and others do not. Common procedures include:  Rubber band ligation. Rubber bands are placed at the base of the hemorrhoids to cut off the blood supply to them.  Sclerotherapy. Medicine is injected into the hemorrhoids to shrink them.  Infrared coagulation. A type of light energy is used to get rid of the hemorrhoids.  Hemorrhoidectomy surgery. The hemorrhoids are surgically removed, and the veins that supply them are tied off.  Stapled hemorrhoidopexy surgery. A circular stapling device is used to remove the hemorrhoids and use staples to cut off the blood supply  to them. Follow these instructions at home: Eating and drinking  Eat foods that have a lot of fiber in them, such as whole grains, beans, nuts, fruits, and vegetables. Ask your health care provider about taking products that have added fiber (fiber supplements).  Drink enough fluid to keep your urine clear or pale yellow. Managing pain and swelling  Take warm sitz baths for 20 minutes, 3-4 times a day to ease pain and discomfort.  If directed, apply ice to the affected area. Using ice packs between sitz baths may be helpful.  Put ice in a plastic bag.  Place a towel between your skin and the bag.  Leave the ice on for 20 minutes, 2-3 times a day. General instructions  Take over-the-counter and prescription medicines only as told by your health care provider.  Use medicated creams or suppositories as told.  Exercise regularly.  Go to the bathroom when you have the urge to have a bowel movement. Do not wait.  Avoid straining to have bowel movements.  Keep the anal area dry and clean. Use wet toilet paper or moist towelettes after a bowel movement.  Do not sit on the toilet for long periods of time. This increases blood pooling and pain. Contact a health care provider if:  You have increasing pain and swelling that are not controlled by treatment or medicine.  You have uncontrolled bleeding.  You have difficulty having a bowel movement, or you are unable to have a bowel movement.  You have pain or inflammation outside the area of the hemorrhoids.

## 2016-11-19 NOTE — H&P (View-Only) (Signed)
Referring Provider: Riley Lam Primary Care Physician:  Jalene Mullet, PA-C Primary GI: Dr. Gala Romney   Chief Complaint  Patient presents with  . Dysphagia    HPI:   [name] is a 66 y.o. female presenting today with a history of  IBD (segmental colitis), with last colonoscopy in 2015. History of UC, with colonoscopy in 2015 and presentation atypical UC. Started on anticoagulation for DVT in March 2017, with onset of hematochezia and anemia. Hgb much improved when last checked in April (from 9 to 11 range). Stool studies negative for infectious process, and she was started on entocort for suspected flare. Will be on anticoagulation through Jan 2018 and recently received a letter from PCP that patient may stop Xarelto. Has been having issues with LLQ discomfort, with CT completed without any obvious abnormalities   She has done well with supportive treatment in the way of Bentyl, as it seems she has an IBS overlay with certain foods triggering symptoms.  She has had intermittent low-volume rectal bleeding in setting of anticoagulation but not felt to be in persistent flare. None recently. Still with occasional LLQ discomfort. Diarrhea may comes and go. Sometimes constipated, sometimes not. Taking Bentyl 2-3 times a day, which seems to help with LLQ discomfort and frequency of stool.   Starting 2 weeks ago, she noted recurrent solid food dysphagia. No odynophagia. Difficulties with meats, rice. Occasional issues with bigger pills to include calcium and Colazal.  No decreased appetite. Has noted historical improvement after dilations in the past. Last EGD Dec 2016 with Schatzki's ring s/p dilation, large hiatal hernia. Known esophageal dysmotility.   Outside labs from Nov 2017: Tbili 0.2, Alk Phos 69, AST 13, ALT 9. Cr 0.88. No CBC.   Past Medical History:  Diagnosis Date  . Anemia   . Anginal pain ([place])    PATIENT STATES DOCT THINKS IS FIBROMYALGIA  . Arthritis   . Asthma   .  Cancer ([place])    1980  CERVICAL  . Chronic back pain   . COPD (chronic obstructive pulmonary disease) ([place])   . DDD (degenerative disc disease)   . Degenerative tear of posterior horn of lateral meniscus of right knee 01/27/2014  . Depression   . Diabetes mellitus without complication ([place])   . Diverticulitis   . Fibromyalgia   . GERD (gastroesophageal reflux disease)   . Headache    HX MIGRAINES    . Hiatal hernia   . HOH (hard of hearing)    right  . Pancreatitis   . Pneumonia    1 YR  . PONV (postoperative nausea and vomiting)    only happened after knee arthroscopy  . Primary localized osteoarthritis of right knee 12/11/2015  . PVC's (premature ventricular contractions)   . Schatzki's ring   . Shortness of breath dyspnea    WITH EXERTION   . Sleep apnea    CPAP at night  . Ulcerative colitis 2007  . Wears dentures    full top-partial bottom    Past Surgical History:  Procedure Laterality Date  . arch and foot    . CERVICAL CONE BIOPSY    . CHOLECYSTECTOMY    . COLONOSCOPY  12/03/05   Rourk-marked inflammatory changes of the rectum, left colon, pan colitis, internal hemorrhoids, sigmoid diverticula  . COLONOSCOPY N/A 10/20/2013   Dr. Rourk:Colonic diverticulosis. colonic mucosal ulcerations involving segment of sigmoid colon, atypical for UC. sigmoid colon path with chronic mildly active colitis consistent  with IBD, other biopsies including ascending, transverse, descending colon and rectum unremarkable.   Marland Kitchen DILATION AND CURETTAGE OF UTERUS    . ESOPHAGEAL DILATION N/A 09/20/2015   Procedure: ESOPHAGEAL DILATION;  Surgeon: Daneil Dolin, MD;  [facility];  Service: Endoscopy;  Laterality: N/A;  . ESOPHAGOGASTRODUODENOSCOPY  01/28/2011   Rourk- Schatzki ring, s/p 57F, otherwise normal/Hiatal hernia otherwise normal stomach D1 and D2  . ESOPHAGOGASTRODUODENOSCOPY  11/30/08   Rourk-Schatzki's ring status post dilation, hiatal hernia  . ESOPHAGOGASTRODUODENOSCOPY   07/12/2007   Dilation with 56 French, Schatzki's ring  . ESOPHAGOGASTRODUODENOSCOPY N/A 08/10/2014   Dr.Rourk- prominent schatzki's ring s/p maloney dilation and disruption, moderate sized hiatal hernia  . ESOPHAGOGASTRODUODENOSCOPY N/A 09/20/2015   RMR: Schatzki's ring status post dilation as described above. Rather large hiatal hernia; somewhat baggy atonic esophageal body' otherwise normal EGD  . ESOPHAGOGASTRODUODENOSCOPY (EGD) WITH ESOPHAGEAL DILATION  09/01/2012   Dr. Gala Romney- schatzki's ring, hiatal hernia  . FOOT ARTHRODESIS  [id number]   both feet  . HERNIA REPAIR    . JOINT REPLACEMENT    . KNEE ARTHROSCOPY WITH LATERAL MENISECTOMY Right 01/27/2014   Procedure: RIGHT KNEE ARTHROSCOPY WITH PARTIAL LATERAL MENISECTOMY, ;  Surgeon: Johnny Bridge, MD;  Location: [facility];  Service: Orthopedics;  Laterality: Right;  . MALONEY DILATION N/A 08/10/2014   Procedure: [name] DILATION;  Surgeon: Daneil Dolin, MD;  [facility];  Service: Endoscopy;  Laterality: N/A;  . SAVORY DILATION N/A 08/10/2014   Procedure: SAVORY DILATION;  Surgeon: Daneil Dolin, MD;  [facility];  Service: Endoscopy;  Laterality: N/A;  . SHOULDER OPEN ROTATOR CUFF REPAIR     BILAT  . TONSILLECTOMY    . TOTAL KNEE ARTHROPLASTY Right 12/11/2015   Procedure: TOTAL KNEE ARTHROPLASTY;  Surgeon: Marchia Bond, MD;  Location: [facility];  Service: Orthopedics;  Laterality: Right;    Current Outpatient Prescriptions  Medication Sig Dispense Refill  . ADVAIR DISKUS 250-50 MCG/DOSE AEPB Inhale 1 puff into the lungs daily.    Marland Kitchen albuterol (PROVENTIL) (2.5 MG/3ML) 0.083% nebulizer solution Take 2.5 mg by nebulization every 6 (six) hours as needed for wheezing or shortness of breath.    Marland Kitchen albuterol (PROVENTIL,VENTOLIN) 90 MCG/ACT inhaler Inhale 2 puffs into the lungs every 4 (four) hours as needed for wheezing or shortness of breath.     Marland Kitchen azelastine (ASTELIN) 137 MCG/SPRAY nasal spray 2 sprays  by Nasal route 2 (two) times daily. Use in each nostril as directed     . balsalazide (COLAZAL) 750 MG capsule Take 3 capsules (2,250 mg total) by mouth 2 (two) times daily. 540 capsule 3  . buPROPion (WELLBUTRIN XL) 150 MG 24 hr tablet Take 150 mg by mouth daily.    . busPIRone (BUSPAR) 15 MG tablet Take 7.5 mg by mouth 2 (two) times daily. 1/2 tablet bid    . Calcium Carb-Cholecalciferol (CALCIUM-VITAMIN D) 600-400 MG-UNIT TABS Take 1 tablet by mouth 2 (two) times daily.    . cetirizine (ZYRTEC) 10 MG tablet Take 10 mg by mouth daily.    Marland Kitchen dicyclomine (BENTYL) 10 MG capsule Take 1 capsule (10 mg total) by mouth 4 (four) times daily -  before meals and at bedtime. 360 capsule 3  . DULoxetine (CYMBALTA) 30 MG capsule Take 90 mg by mouth daily.     Marland Kitchen esomeprazole (NEXIUM) 40 MG capsule Take 1 capsule (40 mg total) by mouth every morning. 90 capsule 3  . famotidine-calcium carbonate-magnesium  hydroxide (PEPCID COMPLETE) 10-800-165 MG CHEW chewable tablet Chew 1 tablet by mouth daily as needed (for breakthrough GERD).     . ferrous gluconate (FERGON) 225 (27 FE) MG tablet Take 240 mg by mouth daily.     Marland Kitchen ketotifen (ALAWAY) 0.025 % ophthalmic solution Place 2 drops into both eyes 2 (two) times daily.    . metFORMIN (GLUCOPHAGE-XR) 500 MG 24 hr tablet Take 1,000 mg by mouth every evening.     . methylcellulose (CITRUCEL) oral powder Take 1 packet by mouth 2 (two) times daily.    . miconazole (NEOSPORIN AF) 2 % cream Apply 1 application topically daily as needed (for wound care).    Marland Kitchen NEXIUM 40 MG capsule TAKE 1 CAPSULE DAILY BEFORE BREAKFAST 90 capsule 3  . pravastatin (PRAVACHOL) 20 MG tablet Take 1 tablet (20 mg total) by mouth every evening. 90 tablet 3  . pregabalin (LYRICA) 100 MG capsule Take 100 mg by mouth 2 (two) times daily.    . rivaroxaban (XARELTO) 20 MG TABS tablet Take 20 mg by mouth daily with supper.    . traMADol (ULTRAM-ER) 100 MG 24 hr tablet Take 100 mg by mouth at bedtime.    .  traZODone (DESYREL) 100 MG tablet Take 100 mg by mouth at bedtime.       No current facility-administered medications for this visit.     Allergies as of 10/24/2016 - Review Complete 10/24/2016  Allergen Reaction Noted  . Mesalamine Other (See Comments)   . Cefprozil Other (See Comments)   . Cefuroxime axetil Other (See Comments) 08/08/2008  . Morphine and related Hives and Itching 08/23/2012  . Penicillins Hives 08/08/2008  . Aspirin Other (See Comments)   . Chicken allergy Nausea And Vomiting and Rash 11/27/2015  . Clarithromycin Nausea And Vomiting 08/08/2008  . Codeine Other (See Comments)   . Entex Other (See Comments) 08/08/2008    Family History  Problem Relation Age of Onset  . Colon cancer Neg Hx     Social History   Social History  . Marital status: Married    Spouse name: N/A  . Number of children: 3  . Years of education: N/A   Occupational History  . 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 Smoking status: Former Smoker    Packs/day: 1.00    Years: 30.00    Types: Cigarettes    Start date: 07/07/1969    Quit date: 08/29/2013  . Smokeless tobacco: Never Used  . Alcohol use No  . Drug use: No  . Sexual activity: Yes    Partners: Male    Birth control/ protection: None     Comment: spouse   Other Topics Concern  . None   Social History Narrative  . None    Review of Systems: As mentioned in HPI   Physical Exam: BP 119/69   Pulse 82   Temp 97.6 F (36.4 C) (Oral)   Ht 5' 4"  (1.626 m)   Wt 181 lb 12.8 oz (82.5 kg)   BMI 31.21 kg/m  General:   Alert and oriented. No distress noted. Pleasant and cooperative.  Head:  Normocephalic and atraumatic. Eyes:  Conjuctiva clear without scleral icterus. Mouth:  Oral mucosa pink and moist. Good dentition. No lesions. Heart:  S1, S2 present without murmurs, rubs, or gallops. Regular rate and rhythm. Abdomen:  +BS, soft, non-tender and non-distended. No rebound or guarding. No HSM  or masses noted. Msk:  Symmetrical without  gross deformities. Normal posture. Extremities:  Without edema. Neurologic:  Alert and  oriented x4;  grossly normal neurologically. Psych:  Alert and cooperative. Normal mood and affect.

## 2016-11-20 ENCOUNTER — Encounter (HOSPITAL_COMMUNITY): Payer: Self-pay | Admitting: Internal Medicine

## 2016-11-21 ENCOUNTER — Encounter: Payer: Self-pay | Admitting: Internal Medicine

## 2016-12-03 DIAGNOSIS — J111 Influenza due to unidentified influenza virus with other respiratory manifestations: Secondary | ICD-10-CM | POA: Diagnosis not present

## 2016-12-03 DIAGNOSIS — Z6831 Body mass index (BMI) 31.0-31.9, adult: Secondary | ICD-10-CM | POA: Diagnosis not present

## 2016-12-11 DIAGNOSIS — J209 Acute bronchitis, unspecified: Secondary | ICD-10-CM | POA: Diagnosis not present

## 2016-12-11 DIAGNOSIS — J0101 Acute recurrent maxillary sinusitis: Secondary | ICD-10-CM | POA: Diagnosis not present

## 2016-12-11 DIAGNOSIS — Z6831 Body mass index (BMI) 31.0-31.9, adult: Secondary | ICD-10-CM | POA: Diagnosis not present

## 2016-12-22 DIAGNOSIS — M1712 Unilateral primary osteoarthritis, left knee: Secondary | ICD-10-CM | POA: Diagnosis not present

## 2017-01-21 DIAGNOSIS — L570 Actinic keratosis: Secondary | ICD-10-CM | POA: Diagnosis not present

## 2017-01-21 DIAGNOSIS — L818 Other specified disorders of pigmentation: Secondary | ICD-10-CM | POA: Diagnosis not present

## 2017-01-21 DIAGNOSIS — D1800 Hemangioma unspecified site: Secondary | ICD-10-CM | POA: Diagnosis not present

## 2017-01-28 DIAGNOSIS — E782 Mixed hyperlipidemia: Secondary | ICD-10-CM | POA: Diagnosis not present

## 2017-01-28 DIAGNOSIS — E1165 Type 2 diabetes mellitus with hyperglycemia: Secondary | ICD-10-CM | POA: Diagnosis not present

## 2017-01-28 DIAGNOSIS — K219 Gastro-esophageal reflux disease without esophagitis: Secondary | ICD-10-CM | POA: Diagnosis not present

## 2017-01-28 DIAGNOSIS — N189 Chronic kidney disease, unspecified: Secondary | ICD-10-CM | POA: Diagnosis not present

## 2017-02-09 DIAGNOSIS — Z6831 Body mass index (BMI) 31.0-31.9, adult: Secondary | ICD-10-CM | POA: Diagnosis not present

## 2017-02-09 DIAGNOSIS — J019 Acute sinusitis, unspecified: Secondary | ICD-10-CM | POA: Diagnosis not present

## 2017-02-09 DIAGNOSIS — J209 Acute bronchitis, unspecified: Secondary | ICD-10-CM | POA: Diagnosis not present

## 2017-02-11 DIAGNOSIS — G4733 Obstructive sleep apnea (adult) (pediatric): Secondary | ICD-10-CM | POA: Diagnosis not present

## 2017-02-11 DIAGNOSIS — G4700 Insomnia, unspecified: Secondary | ICD-10-CM | POA: Diagnosis not present

## 2017-02-11 DIAGNOSIS — E782 Mixed hyperlipidemia: Secondary | ICD-10-CM | POA: Diagnosis not present

## 2017-02-11 DIAGNOSIS — E119 Type 2 diabetes mellitus without complications: Secondary | ICD-10-CM | POA: Diagnosis not present

## 2017-02-11 DIAGNOSIS — K5190 Ulcerative colitis, unspecified, without complications: Secondary | ICD-10-CM | POA: Diagnosis not present

## 2017-02-11 DIAGNOSIS — J4520 Mild intermittent asthma, uncomplicated: Secondary | ICD-10-CM | POA: Diagnosis not present

## 2017-02-11 DIAGNOSIS — N189 Chronic kidney disease, unspecified: Secondary | ICD-10-CM | POA: Diagnosis not present

## 2017-02-11 DIAGNOSIS — K219 Gastro-esophageal reflux disease without esophagitis: Secondary | ICD-10-CM | POA: Diagnosis not present

## 2017-02-16 DIAGNOSIS — M5030 Other cervical disc degeneration, unspecified cervical region: Secondary | ICD-10-CM | POA: Diagnosis not present

## 2017-02-16 DIAGNOSIS — M4727 Other spondylosis with radiculopathy, lumbosacral region: Secondary | ICD-10-CM | POA: Diagnosis not present

## 2017-02-19 DIAGNOSIS — M5489 Other dorsalgia: Secondary | ICD-10-CM | POA: Diagnosis not present

## 2017-02-19 DIAGNOSIS — M542 Cervicalgia: Secondary | ICD-10-CM | POA: Diagnosis not present

## 2017-02-19 DIAGNOSIS — M25511 Pain in right shoulder: Secondary | ICD-10-CM | POA: Diagnosis not present

## 2017-02-25 DIAGNOSIS — M5489 Other dorsalgia: Secondary | ICD-10-CM | POA: Diagnosis not present

## 2017-02-25 DIAGNOSIS — M542 Cervicalgia: Secondary | ICD-10-CM | POA: Diagnosis not present

## 2017-02-25 DIAGNOSIS — M25511 Pain in right shoulder: Secondary | ICD-10-CM | POA: Diagnosis not present

## 2017-03-03 DIAGNOSIS — M25511 Pain in right shoulder: Secondary | ICD-10-CM | POA: Diagnosis not present

## 2017-03-03 DIAGNOSIS — M542 Cervicalgia: Secondary | ICD-10-CM | POA: Diagnosis not present

## 2017-03-03 DIAGNOSIS — M5489 Other dorsalgia: Secondary | ICD-10-CM | POA: Diagnosis not present

## 2017-03-05 DIAGNOSIS — M5489 Other dorsalgia: Secondary | ICD-10-CM | POA: Diagnosis not present

## 2017-03-05 DIAGNOSIS — M542 Cervicalgia: Secondary | ICD-10-CM | POA: Diagnosis not present

## 2017-03-05 DIAGNOSIS — M25511 Pain in right shoulder: Secondary | ICD-10-CM | POA: Diagnosis not present

## 2017-03-10 DIAGNOSIS — M542 Cervicalgia: Secondary | ICD-10-CM | POA: Diagnosis not present

## 2017-03-10 DIAGNOSIS — M25511 Pain in right shoulder: Secondary | ICD-10-CM | POA: Diagnosis not present

## 2017-03-10 DIAGNOSIS — M5489 Other dorsalgia: Secondary | ICD-10-CM | POA: Diagnosis not present

## 2017-03-12 DIAGNOSIS — M5489 Other dorsalgia: Secondary | ICD-10-CM | POA: Diagnosis not present

## 2017-03-12 DIAGNOSIS — M25511 Pain in right shoulder: Secondary | ICD-10-CM | POA: Diagnosis not present

## 2017-03-12 DIAGNOSIS — M542 Cervicalgia: Secondary | ICD-10-CM | POA: Diagnosis not present

## 2017-03-16 DIAGNOSIS — M5030 Other cervical disc degeneration, unspecified cervical region: Secondary | ICD-10-CM | POA: Diagnosis not present

## 2017-03-16 DIAGNOSIS — M4727 Other spondylosis with radiculopathy, lumbosacral region: Secondary | ICD-10-CM | POA: Diagnosis not present

## 2017-03-18 IMAGING — RF DG ESOPHAGUS
19 of 24 series · 19 of 24 positions shown · non-contrast
Comparison: None.

CLINICAL DATA: Dysphagia.

EXAM:
ESOPHOGRAM / BARIUM SWALLOW / BARIUM TABLET STUDY
TECHNIQUE: Combined double contrast and single contrast examination performed
using effervescent crystals, thick barium liquid, and thin barium
liquid. The patient was observed with fluoroscopy swallowing a 13 mm
barium sulphate tablet.
FLUOROSCOPY TIME:  Radiation Exposure Index (as provided by the
fluoroscopic device):
If the device does not provide the exposure index:
Fluoroscopy Time:  3 minutes 12 seconds
Number of Acquired Images:  9

[Series 1: run · 1 of 1 slices shown (1 of 19)]
[im 1/1]
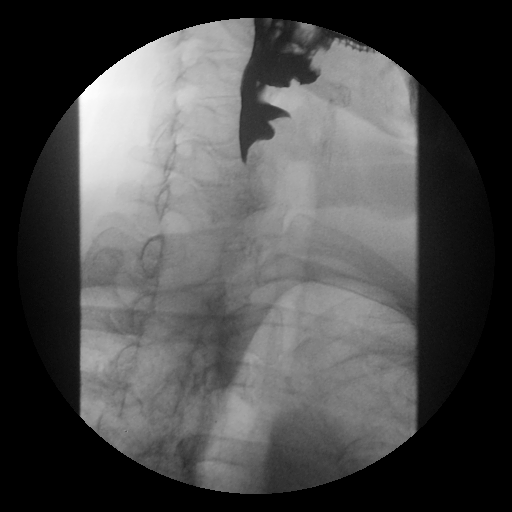

[Series 2: run · 1 of 1 slices shown (2 of 19)]
[im 1/1]
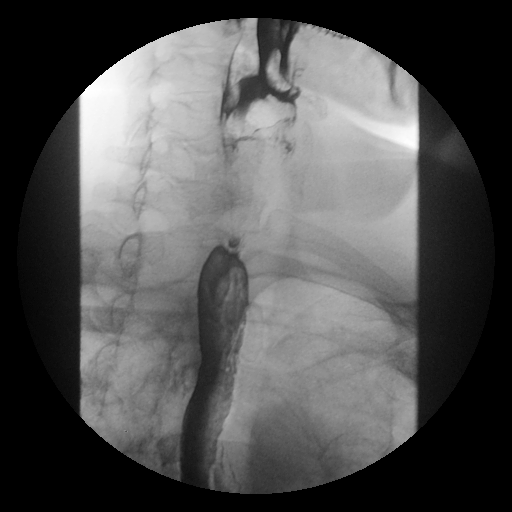

[Series 4: run · 1 of 1 slices shown (3 of 19)]
[im 1/1]
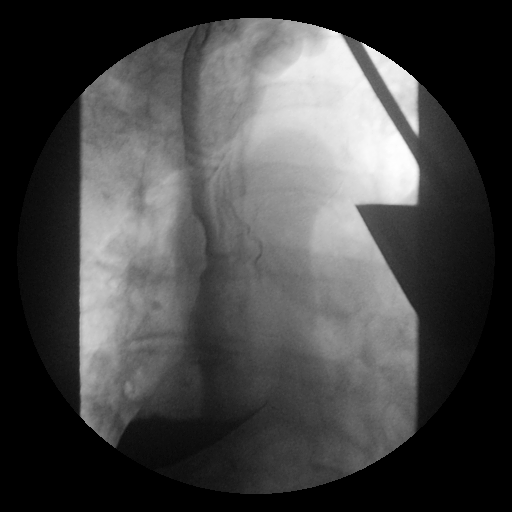

[Series 5: run · 1 of 1 slices shown (4 of 19)]
[im 1/1]
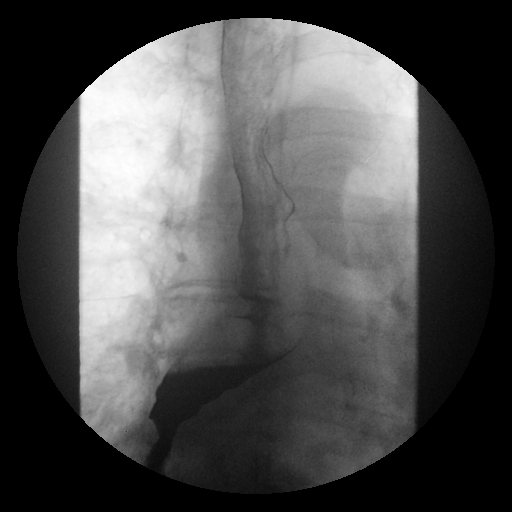

[Series 6: run · 1 of 1 slices shown (5 of 19)]
[im 1/1]
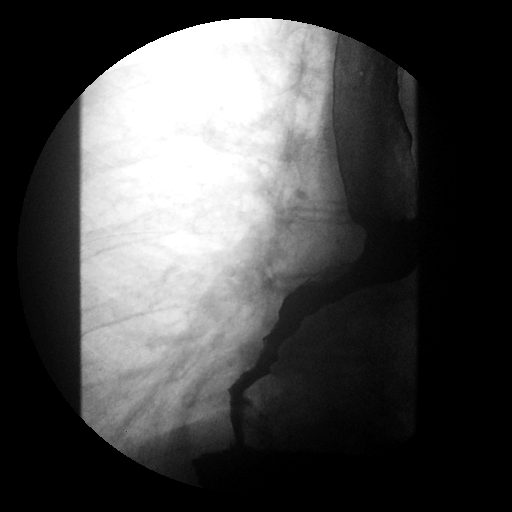

[Series 7: run · 1 of 1 slices shown (6 of 19)]
[im 1/1]
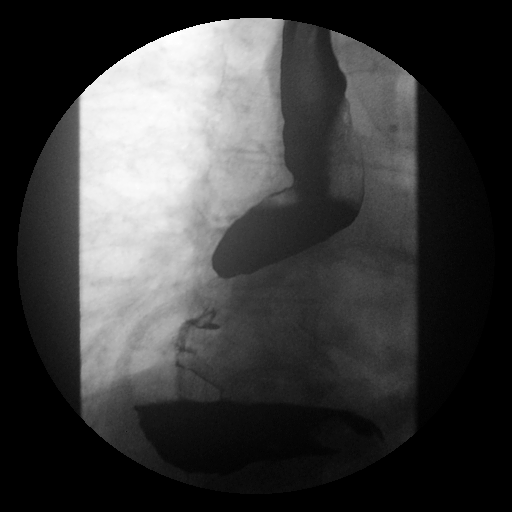

[Series 9: run · 1 of 1 slices shown (7 of 19)]
[im 1/1]
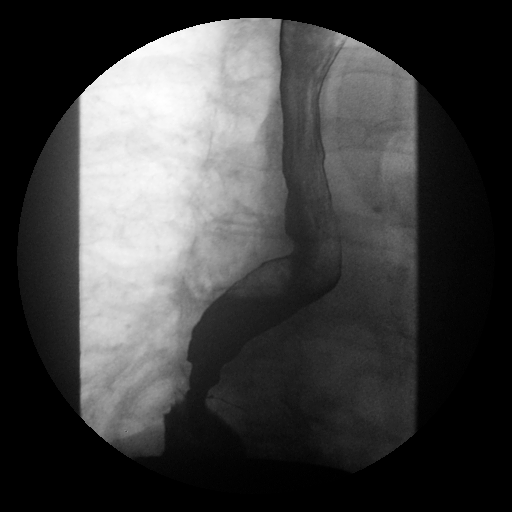

[Series 10: run · 1 of 1 slices shown (8 of 19)]
[im 1/1]
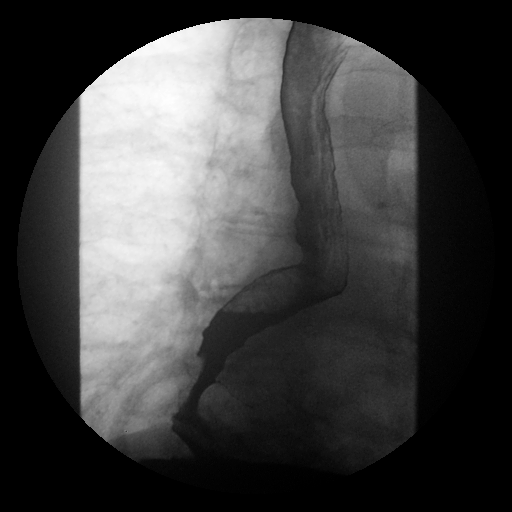

[Series 11: run · 1 of 1 slices shown (9 of 19)]
[im 1/1]
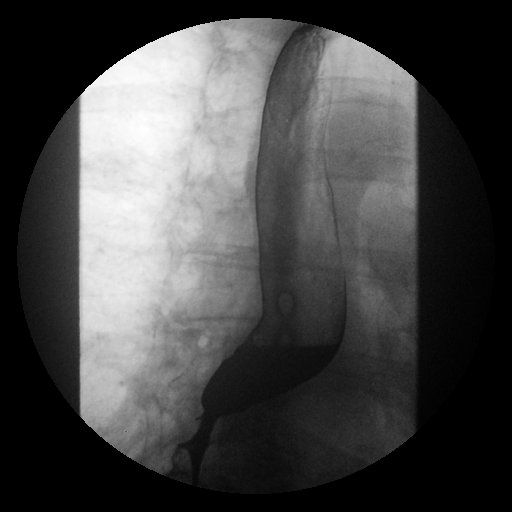

[Series 13: run · 1 of 1 slices shown (10 of 19)]
[im 1/1]
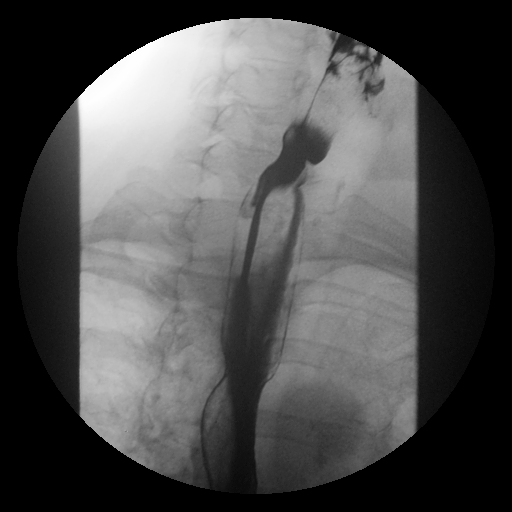

[Series 14: run · 1 of 1 slices shown (11 of 19)]
[im 1/1]
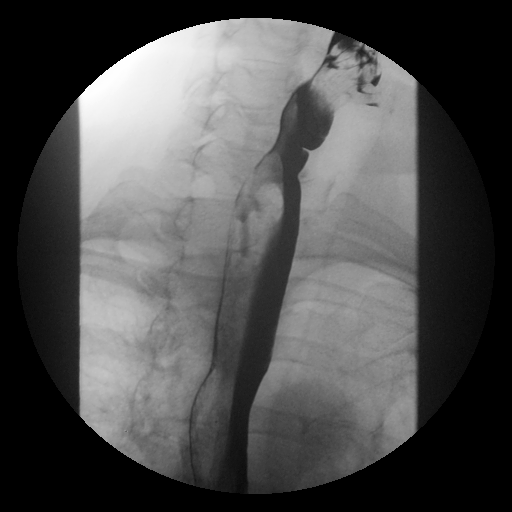

[Series 15: run · 1 of 1 slices shown (12 of 19)]
[im 1/1]
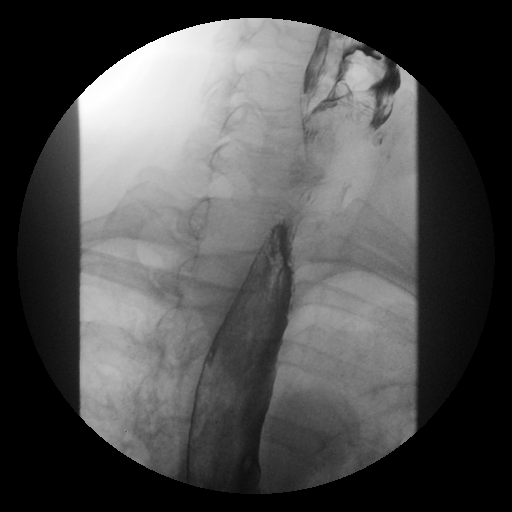

[Series 16: run · 1 of 1 slices shown (13 of 19)]
[im 1/1]
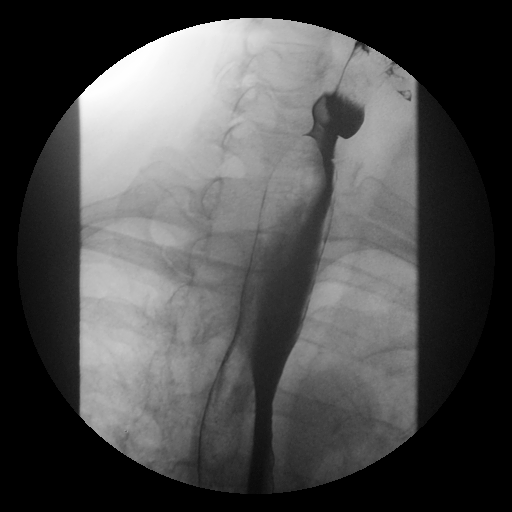

[Series 18: run · 1 of 1 slices shown (14 of 19)]
[im 1/1]
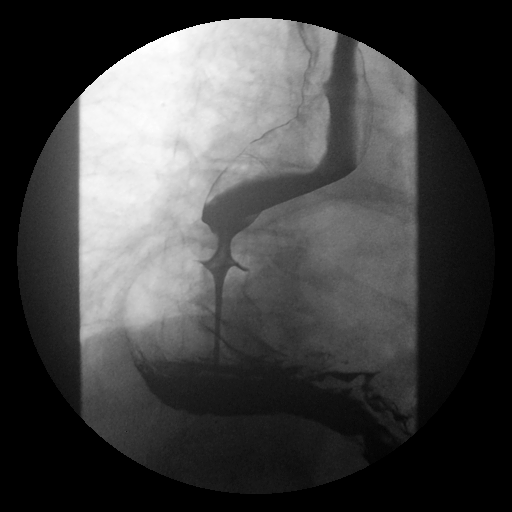

[Series 19: run · 1 of 1 slices shown (15 of 19)]
[im 1/1]
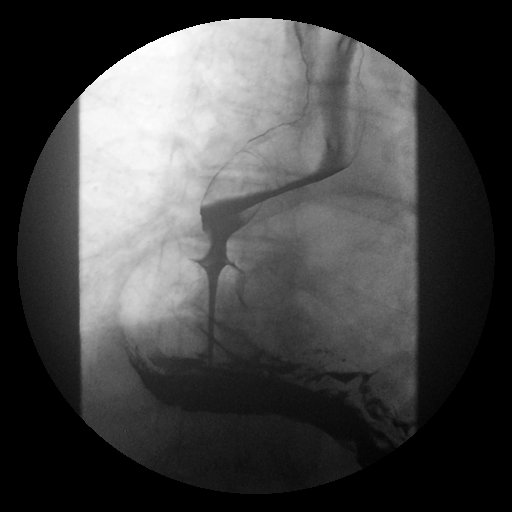

[Series 20: run · 1 of 1 slices shown (16 of 19)]
[im 1/1]
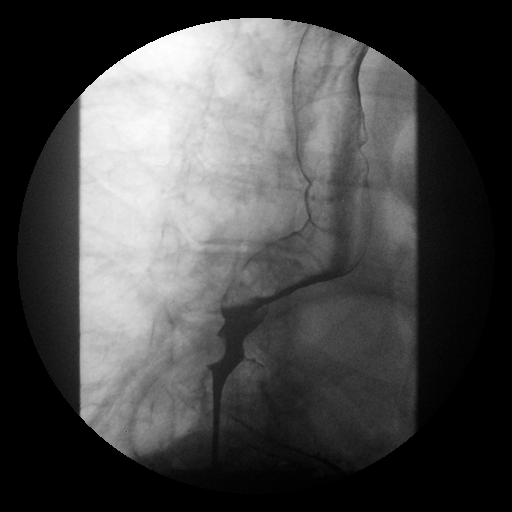

[Series 21: run · 1 of 1 slices shown (17 of 19)]
[im 1/1]
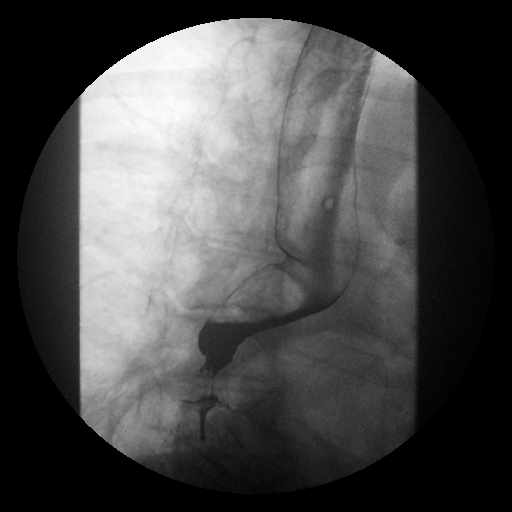

[Series 23: run · 1 of 1 slices shown (18 of 19)]
[im 1/1]
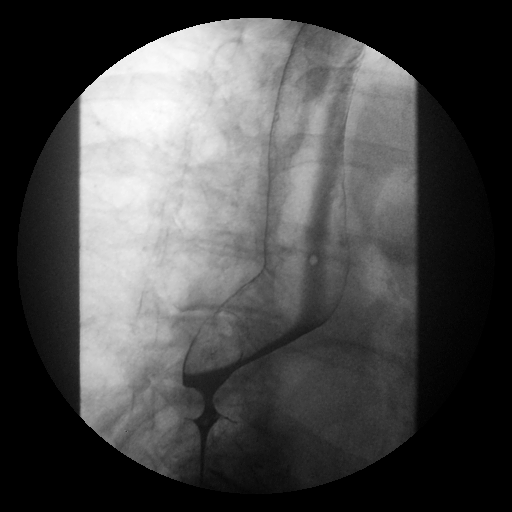

[Series 24: run · 1 of 1 slices shown (19 of 19)]
[im 1/1]
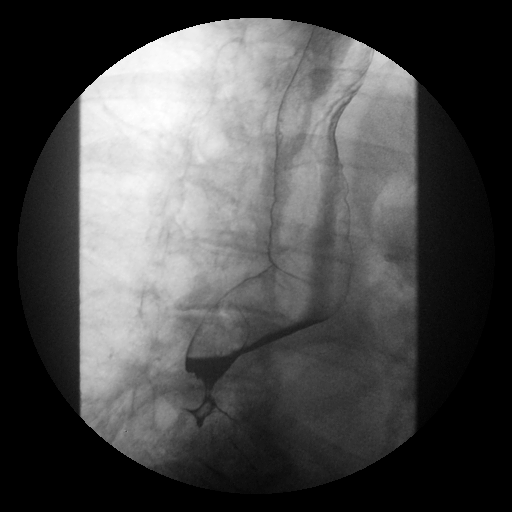

[19 of 24 positions shown; findings below may reference images not displayed]

FINDINGS: Fluoroscopic evaluation of swallowing demonstrates disruption of all
primary esophageal peristaltic waves with stasis and distal
esophageal tertiary contractions. Large hiatal hernia. There is mild
narrowing in the distal esophagus. The 13 mm barium tablet sits in
the distal esophagus for several minutes and reproduces the
patient's symptoms. No reflux noted with the water siphon maneuver.
IMPRESSION: Large hiatal hernia.

Esophageal dysmotility with stasis and tertiary contractions.

Mild narrowing within the distal esophagus which could reflect
peptic stricture. The 13 mm barium tablet sticks in the distal
esophagus and reproduces the patient's symptoms.

## 2017-03-19 DIAGNOSIS — M25511 Pain in right shoulder: Secondary | ICD-10-CM | POA: Diagnosis not present

## 2017-03-19 DIAGNOSIS — M5489 Other dorsalgia: Secondary | ICD-10-CM | POA: Diagnosis not present

## 2017-03-19 DIAGNOSIS — M542 Cervicalgia: Secondary | ICD-10-CM | POA: Diagnosis not present

## 2017-03-24 DIAGNOSIS — M5489 Other dorsalgia: Secondary | ICD-10-CM | POA: Diagnosis not present

## 2017-03-24 DIAGNOSIS — M25511 Pain in right shoulder: Secondary | ICD-10-CM | POA: Diagnosis not present

## 2017-03-24 DIAGNOSIS — M542 Cervicalgia: Secondary | ICD-10-CM | POA: Diagnosis not present

## 2017-03-26 DIAGNOSIS — H52223 Regular astigmatism, bilateral: Secondary | ICD-10-CM | POA: Diagnosis not present

## 2017-03-26 DIAGNOSIS — H5203 Hypermetropia, bilateral: Secondary | ICD-10-CM | POA: Diagnosis not present

## 2017-03-26 DIAGNOSIS — H43813 Vitreous degeneration, bilateral: Secondary | ICD-10-CM | POA: Diagnosis not present

## 2017-03-26 DIAGNOSIS — H02201 Unspecified lagophthalmos right upper eyelid: Secondary | ICD-10-CM | POA: Diagnosis not present

## 2017-03-26 DIAGNOSIS — M25511 Pain in right shoulder: Secondary | ICD-10-CM | POA: Diagnosis not present

## 2017-03-26 DIAGNOSIS — H524 Presbyopia: Secondary | ICD-10-CM | POA: Diagnosis not present

## 2017-03-26 DIAGNOSIS — E119 Type 2 diabetes mellitus without complications: Secondary | ICD-10-CM | POA: Diagnosis not present

## 2017-03-26 DIAGNOSIS — H2513 Age-related nuclear cataract, bilateral: Secondary | ICD-10-CM | POA: Diagnosis not present

## 2017-03-26 DIAGNOSIS — M542 Cervicalgia: Secondary | ICD-10-CM | POA: Diagnosis not present

## 2017-03-26 DIAGNOSIS — M5489 Other dorsalgia: Secondary | ICD-10-CM | POA: Diagnosis not present

## 2017-03-31 DIAGNOSIS — M25511 Pain in right shoulder: Secondary | ICD-10-CM | POA: Diagnosis not present

## 2017-03-31 DIAGNOSIS — M5489 Other dorsalgia: Secondary | ICD-10-CM | POA: Diagnosis not present

## 2017-03-31 DIAGNOSIS — M25572 Pain in left ankle and joints of left foot: Secondary | ICD-10-CM | POA: Diagnosis not present

## 2017-03-31 DIAGNOSIS — T8484XA Pain due to internal orthopedic prosthetic devices, implants and grafts, initial encounter: Secondary | ICD-10-CM | POA: Diagnosis not present

## 2017-03-31 DIAGNOSIS — M79672 Pain in left foot: Secondary | ICD-10-CM | POA: Diagnosis not present

## 2017-03-31 DIAGNOSIS — M960 Pseudarthrosis after fusion or arthrodesis: Secondary | ICD-10-CM | POA: Diagnosis not present

## 2017-03-31 DIAGNOSIS — M542 Cervicalgia: Secondary | ICD-10-CM | POA: Diagnosis not present

## 2017-04-02 DIAGNOSIS — M25511 Pain in right shoulder: Secondary | ICD-10-CM | POA: Diagnosis not present

## 2017-04-02 DIAGNOSIS — M5489 Other dorsalgia: Secondary | ICD-10-CM | POA: Diagnosis not present

## 2017-04-02 DIAGNOSIS — M542 Cervicalgia: Secondary | ICD-10-CM | POA: Diagnosis not present

## 2017-04-03 DIAGNOSIS — N3000 Acute cystitis without hematuria: Secondary | ICD-10-CM | POA: Diagnosis not present

## 2017-04-03 DIAGNOSIS — Z6832 Body mass index (BMI) 32.0-32.9, adult: Secondary | ICD-10-CM | POA: Diagnosis not present

## 2017-04-03 DIAGNOSIS — Z01419 Encounter for gynecological examination (general) (routine) without abnormal findings: Secondary | ICD-10-CM | POA: Diagnosis not present

## 2017-04-06 DIAGNOSIS — M4727 Other spondylosis with radiculopathy, lumbosacral region: Secondary | ICD-10-CM | POA: Diagnosis not present

## 2017-04-08 DIAGNOSIS — M5489 Other dorsalgia: Secondary | ICD-10-CM | POA: Diagnosis not present

## 2017-04-08 DIAGNOSIS — M542 Cervicalgia: Secondary | ICD-10-CM | POA: Diagnosis not present

## 2017-04-08 DIAGNOSIS — M25511 Pain in right shoulder: Secondary | ICD-10-CM | POA: Diagnosis not present

## 2017-04-15 DIAGNOSIS — M5416 Radiculopathy, lumbar region: Secondary | ICD-10-CM | POA: Diagnosis not present

## 2017-04-20 DIAGNOSIS — J0100 Acute maxillary sinusitis, unspecified: Secondary | ICD-10-CM | POA: Diagnosis not present

## 2017-04-20 DIAGNOSIS — Z6833 Body mass index (BMI) 33.0-33.9, adult: Secondary | ICD-10-CM | POA: Diagnosis not present

## 2017-04-27 DIAGNOSIS — Z1231 Encounter for screening mammogram for malignant neoplasm of breast: Secondary | ICD-10-CM | POA: Diagnosis not present

## 2017-05-12 DIAGNOSIS — M960 Pseudarthrosis after fusion or arthrodesis: Secondary | ICD-10-CM | POA: Diagnosis not present

## 2017-05-12 DIAGNOSIS — M79672 Pain in left foot: Secondary | ICD-10-CM | POA: Diagnosis not present

## 2017-05-12 DIAGNOSIS — Z969 Presence of functional implant, unspecified: Secondary | ICD-10-CM | POA: Diagnosis not present

## 2017-05-15 DIAGNOSIS — M960 Pseudarthrosis after fusion or arthrodesis: Secondary | ICD-10-CM | POA: Diagnosis not present

## 2017-05-15 DIAGNOSIS — M19072 Primary osteoarthritis, left ankle and foot: Secondary | ICD-10-CM | POA: Diagnosis not present

## 2017-05-18 DIAGNOSIS — M4727 Other spondylosis with radiculopathy, lumbosacral region: Secondary | ICD-10-CM | POA: Diagnosis not present

## 2017-05-22 DIAGNOSIS — M79672 Pain in left foot: Secondary | ICD-10-CM | POA: Diagnosis not present

## 2017-05-22 DIAGNOSIS — Z969 Presence of functional implant, unspecified: Secondary | ICD-10-CM | POA: Diagnosis not present

## 2017-05-22 DIAGNOSIS — M960 Pseudarthrosis after fusion or arthrodesis: Secondary | ICD-10-CM | POA: Diagnosis not present

## 2017-05-25 DIAGNOSIS — K219 Gastro-esophageal reflux disease without esophagitis: Secondary | ICD-10-CM | POA: Diagnosis not present

## 2017-05-25 DIAGNOSIS — K5190 Ulcerative colitis, unspecified, without complications: Secondary | ICD-10-CM | POA: Diagnosis not present

## 2017-05-25 DIAGNOSIS — F321 Major depressive disorder, single episode, moderate: Secondary | ICD-10-CM | POA: Diagnosis not present

## 2017-05-25 DIAGNOSIS — I2699 Other pulmonary embolism without acute cor pulmonale: Secondary | ICD-10-CM | POA: Diagnosis not present

## 2017-05-25 DIAGNOSIS — E1165 Type 2 diabetes mellitus with hyperglycemia: Secondary | ICD-10-CM | POA: Diagnosis not present

## 2017-05-25 DIAGNOSIS — E7800 Pure hypercholesterolemia, unspecified: Secondary | ICD-10-CM | POA: Diagnosis not present

## 2017-05-25 DIAGNOSIS — Z7901 Long term (current) use of anticoagulants: Secondary | ICD-10-CM | POA: Diagnosis not present

## 2017-05-25 DIAGNOSIS — E782 Mixed hyperlipidemia: Secondary | ICD-10-CM | POA: Diagnosis not present

## 2017-05-25 DIAGNOSIS — N189 Chronic kidney disease, unspecified: Secondary | ICD-10-CM | POA: Diagnosis not present

## 2017-05-27 DIAGNOSIS — K5190 Ulcerative colitis, unspecified, without complications: Secondary | ICD-10-CM | POA: Diagnosis not present

## 2017-05-27 DIAGNOSIS — J4520 Mild intermittent asthma, uncomplicated: Secondary | ICD-10-CM | POA: Diagnosis not present

## 2017-05-27 DIAGNOSIS — R011 Cardiac murmur, unspecified: Secondary | ICD-10-CM | POA: Diagnosis not present

## 2017-05-27 DIAGNOSIS — Z6832 Body mass index (BMI) 32.0-32.9, adult: Secondary | ICD-10-CM | POA: Diagnosis not present

## 2017-05-27 DIAGNOSIS — E782 Mixed hyperlipidemia: Secondary | ICD-10-CM | POA: Diagnosis not present

## 2017-05-27 DIAGNOSIS — N189 Chronic kidney disease, unspecified: Secondary | ICD-10-CM | POA: Diagnosis not present

## 2017-05-27 DIAGNOSIS — K219 Gastro-esophageal reflux disease without esophagitis: Secondary | ICD-10-CM | POA: Diagnosis not present

## 2017-05-27 DIAGNOSIS — E119 Type 2 diabetes mellitus without complications: Secondary | ICD-10-CM | POA: Diagnosis not present

## 2017-06-03 DIAGNOSIS — R011 Cardiac murmur, unspecified: Secondary | ICD-10-CM | POA: Diagnosis not present

## 2017-06-10 ENCOUNTER — Telehealth: Payer: Self-pay | Admitting: Cardiology

## 2017-06-10 DIAGNOSIS — M47816 Spondylosis without myelopathy or radiculopathy, lumbar region: Secondary | ICD-10-CM | POA: Diagnosis not present

## 2017-06-10 DIAGNOSIS — M4727 Other spondylosis with radiculopathy, lumbosacral region: Secondary | ICD-10-CM | POA: Diagnosis not present

## 2017-06-10 NOTE — Telephone Encounter (Signed)
Pt lvm stating she's needing to have surgery on her foot and is needing surgical clearance. Did not leave who is doing the surgery or when it is scheduled. Pt can be reached @ [phone]

## 2017-06-11 NOTE — Telephone Encounter (Signed)
Dr. Marcheta Bean [facility]) - [facility] is doing surgery on her foot.  Stated that Paula Mends, PA ([facility]) already did echo.  See EPIC - echo is scanned in already.  Sees her every 3 months.  No c/o chest pain or SOB, does have dizziness which is normal for.  States that PA wanted okay from you in regards to clearance.  Last seen November 2017, not due again till this November for her 1 year check up.

## 2017-06-11 NOTE — Telephone Encounter (Signed)
Left message to return call 

## 2017-06-11 NOTE — Telephone Encounter (Signed)
Patient notified. Appointment made on 9/19

## 2017-06-11 NOTE — Telephone Encounter (Signed)
Id like to see her in clinic first, I have some openings next Friday   J Cloria Ciresi MD

## 2017-06-16 DIAGNOSIS — Z91012 Allergy to eggs: Secondary | ICD-10-CM | POA: Diagnosis not present

## 2017-06-16 DIAGNOSIS — Z886 Allergy status to analgesic agent status: Secondary | ICD-10-CM | POA: Diagnosis not present

## 2017-06-16 DIAGNOSIS — E784 Other hyperlipidemia: Secondary | ICD-10-CM | POA: Diagnosis not present

## 2017-06-16 DIAGNOSIS — Z7984 Long term (current) use of oral hypoglycemic drugs: Secondary | ICD-10-CM | POA: Diagnosis not present

## 2017-06-16 DIAGNOSIS — Z888 Allergy status to other drugs, medicaments and biological substances status: Secondary | ICD-10-CM | POA: Diagnosis not present

## 2017-06-16 DIAGNOSIS — M960 Pseudarthrosis after fusion or arthrodesis: Secondary | ICD-10-CM | POA: Diagnosis not present

## 2017-06-16 DIAGNOSIS — Z96651 Presence of right artificial knee joint: Secondary | ICD-10-CM | POA: Diagnosis not present

## 2017-06-16 DIAGNOSIS — Z969 Presence of functional implant, unspecified: Secondary | ICD-10-CM | POA: Diagnosis not present

## 2017-06-16 DIAGNOSIS — R262 Difficulty in walking, not elsewhere classified: Secondary | ICD-10-CM | POA: Diagnosis not present

## 2017-06-16 DIAGNOSIS — Z7951 Long term (current) use of inhaled steroids: Secondary | ICD-10-CM | POA: Diagnosis not present

## 2017-06-16 DIAGNOSIS — M1990 Unspecified osteoarthritis, unspecified site: Secondary | ICD-10-CM | POA: Diagnosis not present

## 2017-06-16 DIAGNOSIS — F329 Major depressive disorder, single episode, unspecified: Secondary | ICD-10-CM | POA: Diagnosis not present

## 2017-06-16 DIAGNOSIS — F419 Anxiety disorder, unspecified: Secondary | ICD-10-CM | POA: Diagnosis not present

## 2017-06-16 DIAGNOSIS — M79672 Pain in left foot: Secondary | ICD-10-CM | POA: Diagnosis not present

## 2017-06-16 DIAGNOSIS — M810 Age-related osteoporosis without current pathological fracture: Secondary | ICD-10-CM | POA: Diagnosis not present

## 2017-06-16 DIAGNOSIS — N289 Disorder of kidney and ureter, unspecified: Secondary | ICD-10-CM | POA: Diagnosis not present

## 2017-06-16 DIAGNOSIS — Z880 Allergy status to penicillin: Secondary | ICD-10-CM | POA: Diagnosis not present

## 2017-06-16 DIAGNOSIS — Z79899 Other long term (current) drug therapy: Secondary | ICD-10-CM | POA: Diagnosis not present

## 2017-06-16 DIAGNOSIS — J449 Chronic obstructive pulmonary disease, unspecified: Secondary | ICD-10-CM | POA: Diagnosis not present

## 2017-06-16 DIAGNOSIS — T8484XA Pain due to internal orthopedic prosthetic devices, implants and grafts, initial encounter: Secondary | ICD-10-CM | POA: Diagnosis not present

## 2017-06-16 DIAGNOSIS — M797 Fibromyalgia: Secondary | ICD-10-CM | POA: Diagnosis not present

## 2017-06-16 DIAGNOSIS — G8918 Other acute postprocedural pain: Secondary | ICD-10-CM | POA: Diagnosis not present

## 2017-06-16 DIAGNOSIS — G4730 Sleep apnea, unspecified: Secondary | ICD-10-CM | POA: Diagnosis not present

## 2017-06-16 DIAGNOSIS — E119 Type 2 diabetes mellitus without complications: Secondary | ICD-10-CM | POA: Diagnosis not present

## 2017-06-16 DIAGNOSIS — Z7982 Long term (current) use of aspirin: Secondary | ICD-10-CM | POA: Diagnosis not present

## 2017-06-16 DIAGNOSIS — K219 Gastro-esophageal reflux disease without esophagitis: Secondary | ICD-10-CM | POA: Diagnosis not present

## 2017-06-17 ENCOUNTER — Ambulatory Visit (INDEPENDENT_AMBULATORY_CARE_PROVIDER_SITE_OTHER): Payer: Medicare Other | Admitting: Cardiology

## 2017-06-17 ENCOUNTER — Encounter: Payer: Self-pay | Admitting: Cardiology

## 2017-06-17 VITALS — BP 124/68 | HR 83 | Ht 64.0 in | Wt 187.0 lb

## 2017-06-17 DIAGNOSIS — R079 Chest pain, unspecified: Secondary | ICD-10-CM

## 2017-06-17 DIAGNOSIS — E782 Mixed hyperlipidemia: Secondary | ICD-10-CM | POA: Diagnosis not present

## 2017-06-17 DIAGNOSIS — Z01810 Encounter for preprocedural cardiovascular examination: Secondary | ICD-10-CM | POA: Diagnosis not present

## 2017-06-17 NOTE — Patient Instructions (Signed)

## 2017-06-17 NOTE — Progress Notes (Signed)
Clinical Summary [name] is a 66 y.o.female seen today for follow up of the following medical problems.   1. Chest pain - history of atypical chest pain.  - 06/2014 Lexiscan MPI without ischemia - she has had prior EGDs with dilatation as well that has previously improved her symptoms. .    - 05/2017 echo with normal LVEF - mild increase in chest pain since last visit. Mainly occurs at rest. No other associated. Lasts about 5-38mnutes, occurs 2-3 times a month. Can depend on diet, can be better antacid - sedentary lifestyle, highest level of activity is walking at store. Can have some SOB with activities.    2. Hyperlipidemia - tolerating pravastatin well, previous muscle aches on lipitor -she reports a recent lipid test with her pcp  3. Ulcerating colitis - followed by GI  4. Preoperative evaluation - being considered for foot surgery - sedentary lifestyle, SOB with < 4METs   Past Medical History:  Diagnosis Date  . Anemia   . Anginal pain ([place]    PATIENT STATES DOCT THINKS IS FIBROMYALGIA  . Arthritis   . Asthma   . Blood clot in lung last march  . Cancer ([place]    1980  CERVICAL  . Chronic back pain   . COPD (chronic obstructive pulmonary disease) ([place]   . DDD (degenerative disc disease)   . Degenerative tear of posterior horn of lateral meniscus of right knee 01/27/2014  . Depression   . Diabetes mellitus without complication [place]   . Diverticulitis   . Fibromyalgia   . GERD (gastroesophageal reflux disease)   . Headache    HX MIGRAINES    . Hiatal hernia   . HOH (hard of hearing)    right  . Pancreatitis   . Pneumonia    1 YR  . PONV (postoperative nausea and vomiting)    only happened after knee arthroscopy  . Primary localized osteoarthritis of right knee 12/11/2015  . PVC's (premature ventricular contractions)   . Schatzki's ring   . Shortness of breath dyspnea    WITH EXERTION   . Sleep apnea    CPAP at night  . Ulcerative colitis  2007  . Wears dentures    full top-partial bottom     Allergies  Allergen Reactions  . Mesalamine Other (See Comments)    Pancreatitis   . Cefprozil Other (See Comments)    Aggravates Ulcerative colitis   . Cefuroxime Axetil Other (See Comments)    Aggravates Ulcerative colitis   . Morphine And Related Hives and Itching  . Penicillins Hives    Has patient had a PCN reaction causing immediate rash, facial/tongue/throat swelling, SOB or lightheadedness with hypotension: No Has patient had a PCN reaction causing severe rash involving mucus membranes or skin necrosis: No Has patient had a PCN reaction that required hospitalization No Has patient had a PCN reaction occurring within the last 10 years: yes If all of the above answers are "NO", then may proceed with Cephalosporin use.   . Aspirin Other (See Comments)    Affects the central nervous system. "shakey"  . Chicken Allergy Nausea And Vomiting and Rash  . Clarithromycin Nausea And Vomiting  . Codeine Other (See Comments)    Hallucinations/bad dreams.  . Entex Other (See Comments)    insomnia     Current Outpatient Prescriptions  Medication Sig Dispense Refill  . ADVAIR DISKUS 250-50 MCG/DOSE AEPB Inhale 1 puff into the lungs 2 (two) times daily.     .Marland Kitchen  albuterol (PROVENTIL) (2.5 MG/3ML) 0.083% nebulizer solution Take 2.5 mg by nebulization every 6 (six) hours as needed for wheezing or shortness of breath.    Marland Kitchen albuterol (PROVENTIL,VENTOLIN) 90 MCG/ACT inhaler Inhale 2 puffs into the lungs every 4 (four) hours as needed for wheezing or shortness of breath.     Marland Kitchen aspirin 81 MG chewable tablet Chew 81 mg by mouth daily.    Marland Kitchen azelastine (ASTELIN) 137 MCG/SPRAY nasal spray 2 sprays by Nasal route 2 (two) times daily. Use in each nostril as directed     . balsalazide (COLAZAL) 750 MG capsule Take 3 capsules (2,250 mg total) by mouth 2 (two) times daily. 540 capsule 3  . buPROPion (WELLBUTRIN XL) 150 MG 24 hr tablet Take 150 mg by  mouth daily.    . busPIRone (BUSPAR) 15 MG tablet Take 7.5 mg by mouth 2 (two) times daily.     . Calcium Carb-Cholecalciferol (CALCIUM-VITAMIN D) 600-400 MG-UNIT TABS Take 1 tablet by mouth 2 (two) times daily.    . cetirizine (ZYRTEC) 10 MG tablet Take 10 mg by mouth daily.    Marland Kitchen dicyclomine (BENTYL) 10 MG capsule Take 1 capsule (10 mg total) by mouth 4 (four) times daily -  before meals and at bedtime. (Patient taking differently: Take 10 mg by mouth 3 (three) times daily. ) 360 capsule 3  . DULoxetine (CYMBALTA) 30 MG capsule Take 90 mg by mouth daily.     . famotidine-calcium carbonate-magnesium hydroxide (PEPCID COMPLETE) 10-800-165 MG CHEW chewable tablet Chew 1 tablet by mouth daily as needed (for breakthrough GERD).     . ferrous sulfate 325 (65 FE) MG tablet Take 325 mg by mouth daily with breakfast.    . ketotifen (ALAWAY) 0.025 % ophthalmic solution Place 1 drop into both eyes 2 (two) times daily.     . metFORMIN (GLUCOPHAGE-XR) 500 MG 24 hr tablet Take 1,000 mg by mouth every evening.    . methylcellulose (CITRUCEL) oral powder Take 1 packet by mouth daily as needed (constipation).     . NEXIUM 40 MG capsule TAKE 1 CAPSULE DAILY BEFORE BREAKFAST 90 capsule 3  . polyethylene glycol-electrolytes (TRILYTE) 420 g solution Take 4,000 mLs by mouth as directed. 4000 mL 0  . pravastatin (PRAVACHOL) 20 MG tablet Take 1 tablet (20 mg total) by mouth every evening. 90 tablet 3  . pregabalin (LYRICA) 100 MG capsule Take 100 mg by mouth 2 (two) times daily.    Marland Kitchen tiZANidine (ZANAFLEX) 4 MG tablet Take 2-4 tablets by mouth every 6 (six) hours as needed for pain or muscle spasms.  1  . traMADol (ULTRAM-ER) 100 MG 24 hr tablet Take 100 mg by mouth at bedtime. 1 hour before sleep    . traZODone (DESYREL) 100 MG tablet Take 100 mg by mouth at bedtime.       No current facility-administered medications for this visit.      Past Surgical History:  Procedure Laterality Date  . arch and foot    .  CERVICAL CONE BIOPSY    . CHOLECYSTECTOMY    . COLONOSCOPY  12/03/05   Rourk-marked inflammatory changes of the rectum, left colon, pan colitis, internal hemorrhoids, sigmoid diverticula  . COLONOSCOPY N/A 10/20/2013   Dr. Rourk:Colonic diverticulosis. colonic mucosal ulcerations involving segment of sigmoid colon, atypical for UC. sigmoid colon path with chronic mildly active colitis consistent with IBD, other biopsies including ascending, transverse, descending colon and rectum unremarkable.   . COLONOSCOPY N/A 11/19/2016   Procedure: COLONOSCOPY;  Surgeon: Daneil Dolin, MD;  [facility];  Service: Endoscopy;  Laterality: N/A;  7:30 AM  . DILATION AND CURETTAGE OF UTERUS    . ESOPHAGEAL DILATION N/A 09/20/2015   Procedure: ESOPHAGEAL DILATION;  Surgeon: Daneil Dolin, MD;  [facility];  Service: Endoscopy;  Laterality: N/A;  . ESOPHAGOGASTRODUODENOSCOPY  01/28/2011   Rourk- Schatzki ring, s/p 32F, otherwise normal/Hiatal hernia otherwise normal stomach D1 and D2  . ESOPHAGOGASTRODUODENOSCOPY  11/30/08   Rourk-Schatzki's ring status post dilation, hiatal hernia  . ESOPHAGOGASTRODUODENOSCOPY  07/12/2007   Dilation with 56 French, Schatzki's ring  . ESOPHAGOGASTRODUODENOSCOPY N/A 08/10/2014   Dr.Rourk- prominent schatzki's ring s/p maloney dilation and disruption, moderate sized hiatal hernia  . ESOPHAGOGASTRODUODENOSCOPY N/A 09/20/2015   RMR: Schatzki's ring status post dilation as described above. Rather large hiatal hernia; somewhat baggy atonic esophageal body' otherwise normal EGD  . ESOPHAGOGASTRODUODENOSCOPY N/A 11/19/2016   Procedure: ESOPHAGOGASTRODUODENOSCOPY (EGD);  Surgeon: Daneil Dolin, MD;  [facility];  Service: Endoscopy;  Laterality: N/A;  . ESOPHAGOGASTRODUODENOSCOPY (EGD) WITH ESOPHAGEAL DILATION  09/01/2012   Dr. Gala Romney- schatzki's ring, hiatal hernia  . FOOT ARTHRODESIS  [id number]   both feet  . HERNIA REPAIR    . JOINT REPLACEMENT    .  KNEE ARTHROSCOPY WITH LATERAL MENISECTOMY Right 01/27/2014   Procedure: RIGHT KNEE ARTHROSCOPY WITH PARTIAL LATERAL MENISECTOMY, ;  Surgeon: Johnny Bridge, MD;  Location: [place];  Service: Orthopedics;  Laterality: Right;  . MALONEY DILATION N/A 08/10/2014   Procedure: [name] DILATION;  Surgeon: Daneil Dolin, MD;  [facility];  Service: Endoscopy;  Laterality: N/A;  [name] DILATION N/A 11/19/2016   Procedure: [name] DILATION;  Surgeon: Daneil Dolin, MD;  [facility];  Service: Endoscopy;  Laterality: N/A;  . SAVORY DILATION N/A 08/10/2014   Procedure: SAVORY DILATION;  Surgeon: Daneil Dolin, MD;  [facility];  Service: Endoscopy;  Laterality: N/A;  . SHOULDER OPEN ROTATOR CUFF REPAIR     BILAT  . TONSILLECTOMY    . TOTAL KNEE ARTHROPLASTY Right 12/11/2015   Procedure: TOTAL KNEE ARTHROPLASTY;  Surgeon: Marchia Bond, MD;  Location: [place];  Service: Orthopedics;  Laterality: Right;     Allergies  Allergen Reactions  . Mesalamine Other (See Comments)    Pancreatitis   . Cefprozil Other (See Comments)    Aggravates Ulcerative colitis   . Cefuroxime Axetil Other (See Comments)    Aggravates Ulcerative colitis   . Morphine And Related Hives and Itching  . Penicillins Hives    Has patient had a PCN reaction causing immediate rash, facial/tongue/throat swelling, SOB or lightheadedness with hypotension: No Has patient had a PCN reaction causing severe rash involving mucus membranes or skin necrosis: No Has patient had a PCN reaction that required hospitalization No Has patient had a PCN reaction occurring within the last 10 years: yes If all of the above answers are "NO", then may proceed with Cephalosporin use.   . Aspirin Other (See Comments)    Affects the central nervous system. "shakey"  . Chicken Allergy Nausea And Vomiting and Rash  . Clarithromycin Nausea And Vomiting  . Codeine Other (See Comments)     Hallucinations/bad dreams.  . Entex Other (See Comments)    insomnia      Family History  Problem Relation Age of Onset  . Colon cancer Neg Hx      Social History [name] reports that  she quit smoking about 3 years ago. Her smoking use included Cigarettes. She started smoking about 47 years ago. She has a 30.00 pack-year smoking history. She has never used smokeless tobacco. [name] reports that she does not drink alcohol.   Review of Systems CONSTITUTIONAL: No weight loss, fever, chills, weakness or fatigue.  HEENT: Eyes: No visual loss, blurred vision, double vision or yellow sclerae.No hearing loss, sneezing, congestion, runny nose or sore throat.  SKIN: No rash or itching.  CARDIOVASCULAR: per hpi RESPIRATORY: No shortness of breath, cough or sputum.  GASTROINTESTINAL: No anorexia, nausea, vomiting or diarrhea. No abdominal pain or blood.  GENITOURINARY: No burning on urination, no polyuria NEUROLOGICAL: No headache, dizziness, syncope, paralysis, ataxia, numbness or tingling in the extremities. No change in bowel or bladder control.  MUSCULOSKELETAL: joint pains  LYMPHATICS: No enlarged nodes. No history of splenectomy.  PSYCHIATRIC: No history of depression or anxiety.  ENDOCRINOLOGIC: No reports of sweating, cold or heat intolerance. No polyuria or polydipsia.  Marland Kitchen   Physical Examination Vitals:   06/17/17 1010  BP: 124/68  Pulse: 83  SpO2: 95%   Vitals:   06/17/17 1010  Weight: 187 lb (84.8 kg)  Height: 5' 4"  (1.626 m)    Gen: resting comfortably, no acute distress HEENT: no scleral icterus, pupils equal round and reactive, no palptable cervical adenopathy,  CV: RRR, no m/r/g, no jvd Resp: Clear to auscultation bilaterally GI: abdomen is soft, non-tender, non-distended, normal bowel sounds, no hepatosplenomegaly MSK: extremities are warm, no edema.  Skin: warm, no rash Neuro:  no focal deficits Psych: appropriate affect   Diagnostic  Studies 06/2014 Lexiscan MPI IMPRESSION: 1. No reversible ischemia or infarction.  2. Normal left ventricular wall motion.  3. Left ventricular ejection fraction 60%  4. Low-risk stress test findings*.   07/2014 Echo Study Conclusions  - Left ventricle: The cavity size was normal. Wall thickness was normal. Systolic function was normal. The estimated ejection fraction was in the range of 60% to 65%. - Aortic valve: Valve area (VTI): 3.31 cm^2. Valve area (Vmax): 3.27 cm^2. Valve area (Vmean): 2.77 cm^2. - Left atrium: The atrium was mildly dilated. - Technically difficult study.  05/2017 echo [facility] LVEF 60-45%, normal diastolic function,     Assessment and Plan  1. Chest pain - atypical symptoms most consistent with GI etiology - previous negative stress test, echo in 2015 and recently with normal LVEF and no wall motion abnormalities - continue to monitor at this time.   2. Hyperlipidemia -  Previous side effects on lipitor, tolerating pravastatin well.  - request labs from pcp, continue statin  3. Preoperative evaluation - long history of atypical chest pain, most consistent with GI etiology. Sedentary lifestyle, difficultly tolerating over 4 METs - negative stress test in 2015, recent echo 05/2017 with normal LV function and no significant abnormalities - recommend proceeding with surgery as planned    F/u 1 year  Arnoldo Lenis, M.D.

## 2017-06-22 ENCOUNTER — Other Ambulatory Visit: Payer: Self-pay | Admitting: Gastroenterology

## 2017-06-24 DIAGNOSIS — G8918 Other acute postprocedural pain: Secondary | ICD-10-CM | POA: Diagnosis not present

## 2017-06-24 DIAGNOSIS — Z969 Presence of functional implant, unspecified: Secondary | ICD-10-CM | POA: Diagnosis not present

## 2017-06-24 DIAGNOSIS — R262 Difficulty in walking, not elsewhere classified: Secondary | ICD-10-CM | POA: Diagnosis not present

## 2017-06-24 DIAGNOSIS — K219 Gastro-esophageal reflux disease without esophagitis: Secondary | ICD-10-CM | POA: Diagnosis not present

## 2017-06-24 DIAGNOSIS — M79672 Pain in left foot: Secondary | ICD-10-CM | POA: Diagnosis not present

## 2017-06-24 DIAGNOSIS — S86312A Strain of muscle(s) and tendon(s) of peroneal muscle group at lower leg level, left leg, initial encounter: Secondary | ICD-10-CM | POA: Diagnosis not present

## 2017-06-24 DIAGNOSIS — M960 Pseudarthrosis after fusion or arthrodesis: Secondary | ICD-10-CM | POA: Diagnosis not present

## 2017-06-24 DIAGNOSIS — T8484XA Pain due to internal orthopedic prosthetic devices, implants and grafts, initial encounter: Secondary | ICD-10-CM | POA: Diagnosis not present

## 2017-06-25 DIAGNOSIS — M960 Pseudarthrosis after fusion or arthrodesis: Secondary | ICD-10-CM | POA: Diagnosis not present

## 2017-06-25 DIAGNOSIS — M79672 Pain in left foot: Secondary | ICD-10-CM | POA: Diagnosis not present

## 2017-06-25 DIAGNOSIS — R262 Difficulty in walking, not elsewhere classified: Secondary | ICD-10-CM | POA: Diagnosis not present

## 2017-06-25 DIAGNOSIS — G8918 Other acute postprocedural pain: Secondary | ICD-10-CM | POA: Diagnosis not present

## 2017-06-25 DIAGNOSIS — K219 Gastro-esophageal reflux disease without esophagitis: Secondary | ICD-10-CM | POA: Diagnosis not present

## 2017-06-25 DIAGNOSIS — T8484XA Pain due to internal orthopedic prosthetic devices, implants and grafts, initial encounter: Secondary | ICD-10-CM | POA: Diagnosis not present

## 2017-06-26 DIAGNOSIS — M960 Pseudarthrosis after fusion or arthrodesis: Secondary | ICD-10-CM | POA: Diagnosis not present

## 2017-06-26 DIAGNOSIS — G8918 Other acute postprocedural pain: Secondary | ICD-10-CM | POA: Diagnosis not present

## 2017-06-26 DIAGNOSIS — K219 Gastro-esophageal reflux disease without esophagitis: Secondary | ICD-10-CM | POA: Diagnosis not present

## 2017-06-26 DIAGNOSIS — M79672 Pain in left foot: Secondary | ICD-10-CM | POA: Diagnosis not present

## 2017-06-26 DIAGNOSIS — R262 Difficulty in walking, not elsewhere classified: Secondary | ICD-10-CM | POA: Diagnosis not present

## 2017-06-26 DIAGNOSIS — T8484XA Pain due to internal orthopedic prosthetic devices, implants and grafts, initial encounter: Secondary | ICD-10-CM | POA: Diagnosis not present

## 2017-07-10 ENCOUNTER — Other Ambulatory Visit: Payer: Self-pay | Admitting: Gastroenterology

## 2017-07-13 DIAGNOSIS — M4727 Other spondylosis with radiculopathy, lumbosacral region: Secondary | ICD-10-CM | POA: Diagnosis not present

## 2017-07-24 ENCOUNTER — Other Ambulatory Visit (HOSPITAL_COMMUNITY): Payer: Self-pay | Admitting: Podiatry

## 2017-07-24 ENCOUNTER — Ambulatory Visit (HOSPITAL_COMMUNITY)
Admission: RE | Admit: 2017-07-24 | Discharge: 2017-07-24 | Disposition: A | Discharge status: Home / Self Care | Payer: Medicare Other | Source: Ambulatory Visit | Attending: Podiatry | Admitting: Podiatry

## 2017-07-24 DIAGNOSIS — Z4889 Encounter for other specified surgical aftercare: Secondary | ICD-10-CM | POA: Diagnosis not present

## 2017-07-24 DIAGNOSIS — Z967 Presence of other bone and tendon implants: Secondary | ICD-10-CM | POA: Insufficient documentation

## 2017-07-24 DIAGNOSIS — M79672 Pain in left foot: Secondary | ICD-10-CM | POA: Diagnosis not present

## 2017-07-28 DIAGNOSIS — J0101 Acute recurrent maxillary sinusitis: Secondary | ICD-10-CM | POA: Diagnosis not present

## 2017-08-14 ENCOUNTER — Other Ambulatory Visit: Payer: Self-pay | Admitting: Cardiology

## 2017-08-24 DIAGNOSIS — E7800 Pure hypercholesterolemia, unspecified: Secondary | ICD-10-CM | POA: Diagnosis not present

## 2017-08-24 DIAGNOSIS — M810 Age-related osteoporosis without current pathological fracture: Secondary | ICD-10-CM | POA: Diagnosis not present

## 2017-08-24 DIAGNOSIS — E1165 Type 2 diabetes mellitus with hyperglycemia: Secondary | ICD-10-CM | POA: Diagnosis not present

## 2017-08-24 DIAGNOSIS — F321 Major depressive disorder, single episode, moderate: Secondary | ICD-10-CM | POA: Diagnosis not present

## 2017-08-24 DIAGNOSIS — E782 Mixed hyperlipidemia: Secondary | ICD-10-CM | POA: Diagnosis not present

## 2017-08-24 DIAGNOSIS — N189 Chronic kidney disease, unspecified: Secondary | ICD-10-CM | POA: Diagnosis not present

## 2017-08-26 DIAGNOSIS — Z6831 Body mass index (BMI) 31.0-31.9, adult: Secondary | ICD-10-CM | POA: Diagnosis not present

## 2017-08-26 DIAGNOSIS — E782 Mixed hyperlipidemia: Secondary | ICD-10-CM | POA: Diagnosis not present

## 2017-08-26 DIAGNOSIS — K219 Gastro-esophageal reflux disease without esophagitis: Secondary | ICD-10-CM | POA: Diagnosis not present

## 2017-08-26 DIAGNOSIS — K5190 Ulcerative colitis, unspecified, without complications: Secondary | ICD-10-CM | POA: Diagnosis not present

## 2017-08-26 DIAGNOSIS — J4520 Mild intermittent asthma, uncomplicated: Secondary | ICD-10-CM | POA: Diagnosis not present

## 2017-08-26 DIAGNOSIS — E119 Type 2 diabetes mellitus without complications: Secondary | ICD-10-CM | POA: Diagnosis not present

## 2017-08-26 DIAGNOSIS — Z23 Encounter for immunization: Secondary | ICD-10-CM | POA: Diagnosis not present

## 2017-08-26 DIAGNOSIS — N189 Chronic kidney disease, unspecified: Secondary | ICD-10-CM | POA: Diagnosis not present

## 2017-09-04 ENCOUNTER — Other Ambulatory Visit (HOSPITAL_COMMUNITY): Payer: Self-pay | Admitting: Podiatry

## 2017-09-04 ENCOUNTER — Ambulatory Visit (HOSPITAL_COMMUNITY)
Admission: RE | Admit: 2017-09-04 | Discharge: 2017-09-04 | Disposition: A | Discharge status: Home / Self Care | Payer: Medicare Other | Source: Ambulatory Visit | Attending: Podiatry | Admitting: Podiatry

## 2017-09-04 DIAGNOSIS — Z4889 Encounter for other specified surgical aftercare: Secondary | ICD-10-CM | POA: Diagnosis not present

## 2017-09-04 DIAGNOSIS — M19072 Primary osteoarthritis, left ankle and foot: Secondary | ICD-10-CM | POA: Diagnosis not present

## 2017-09-25 DIAGNOSIS — Z4889 Encounter for other specified surgical aftercare: Secondary | ICD-10-CM | POA: Diagnosis not present

## 2017-10-10 DIAGNOSIS — J302 Other seasonal allergic rhinitis: Secondary | ICD-10-CM | POA: Diagnosis not present

## 2017-10-10 DIAGNOSIS — Z6832 Body mass index (BMI) 32.0-32.9, adult: Secondary | ICD-10-CM | POA: Diagnosis not present

## 2017-10-10 DIAGNOSIS — J0100 Acute maxillary sinusitis, unspecified: Secondary | ICD-10-CM | POA: Diagnosis not present

## 2017-10-10 DIAGNOSIS — E1165 Type 2 diabetes mellitus with hyperglycemia: Secondary | ICD-10-CM | POA: Diagnosis not present

## 2017-10-26 DIAGNOSIS — J0100 Acute maxillary sinusitis, unspecified: Secondary | ICD-10-CM | POA: Diagnosis not present

## 2017-10-26 DIAGNOSIS — R05 Cough: Secondary | ICD-10-CM | POA: Diagnosis not present

## 2017-10-26 DIAGNOSIS — Z6831 Body mass index (BMI) 31.0-31.9, adult: Secondary | ICD-10-CM | POA: Diagnosis not present

## 2017-11-06 ENCOUNTER — Other Ambulatory Visit: Payer: Self-pay

## 2017-11-06 ENCOUNTER — Encounter: Payer: Self-pay | Admitting: Internal Medicine

## 2017-11-06 ENCOUNTER — Ambulatory Visit (INDEPENDENT_AMBULATORY_CARE_PROVIDER_SITE_OTHER): Payer: Medicare Other | Admitting: Internal Medicine

## 2017-11-06 VITALS — BP 120/69 | HR 94 | Temp 98.6°F | Ht 64.0 in | Wt 183.2 lb

## 2017-11-06 DIAGNOSIS — R1319 Other dysphagia: Secondary | ICD-10-CM

## 2017-11-06 DIAGNOSIS — K921 Melena: Secondary | ICD-10-CM

## 2017-11-06 DIAGNOSIS — K219 Gastro-esophageal reflux disease without esophagitis: Secondary | ICD-10-CM | POA: Diagnosis not present

## 2017-11-06 DIAGNOSIS — K51018 Ulcerative (chronic) pancolitis with other complication: Secondary | ICD-10-CM

## 2017-11-06 DIAGNOSIS — R1310 Dysphagia, unspecified: Secondary | ICD-10-CM

## 2017-11-06 NOTE — Progress Notes (Signed)
Primary Care Physician:  Riley Lam Primary Gastroenterologist:  Dr. Gala Romney   Pre-Procedure History & Physical: HPI:  [name] is a 67 y.o. female here for here for a two-month history of rectal bleeding loose stools, intermittent clot passage. Had been anticoagulated because of a DVT but hasn't been on anticoagulation for months. Intermittent left lower quadrant abdominal pain. She has been on at least 2 rounds of antibiotics since her above-mentioned symptoms started. She also received antibiotics when she had her foot surgery back in September. She is compliant with her mesalamine regimen. She denies NSAIDs. Reflux symptoms well controlled on Prilosec. However, she noted recurrent esophageal dysphagia to solid food and pills. Prominent shatzki's ring dilated approximately 1 year ago.  Past Medical History:  Diagnosis Date  . Anemia   . Anginal pain ([place])    PATIENT STATES DOCT THINKS IS FIBROMYALGIA  . Arthritis   . Asthma   . Blood clot in lung last march  . Cancer ([place])    1980  CERVICAL  . Chronic back pain   . COPD (chronic obstructive pulmonary disease) ([place])   . DDD (degenerative disc disease)   . Degenerative tear of posterior horn of lateral meniscus of right knee 01/27/2014  . Depression   . Diabetes mellitus without complication ([place])   . Diverticulitis   . Fibromyalgia   . GERD (gastroesophageal reflux disease)   . Headache    HX MIGRAINES    . Hiatal hernia   . HOH (hard of hearing)    right  . Pancreatitis   . Pneumonia    1 YR  . PONV (postoperative nausea and vomiting)    only happened after knee arthroscopy  . Primary localized osteoarthritis of right knee 12/11/2015  . PVC's (premature ventricular contractions)   . Schatzki's ring   . Shortness of breath dyspnea    WITH EXERTION   . Sleep apnea    CPAP at night  . Ulcerative colitis 2007  . Wears dentures    full top-partial bottom    Past Surgical History:  Procedure Laterality  Date  . arch and foot    . CERVICAL CONE BIOPSY    . CHOLECYSTECTOMY    . COLONOSCOPY  12/03/05   Rayvn Rickerson-marked inflammatory changes of the rectum, left colon, pan colitis, internal hemorrhoids, sigmoid diverticula  . COLONOSCOPY N/A 10/20/2013   Dr. Daleyza Gadomski:Colonic diverticulosis. colonic mucosal ulcerations involving segment of sigmoid colon, atypical for UC. sigmoid colon path with chronic mildly active colitis consistent with IBD, other biopsies including ascending, transverse, descending colon and rectum unremarkable.   . COLONOSCOPY N/A 11/19/2016   Procedure: COLONOSCOPY;  Surgeon: Daneil Dolin, MD;  [facility];  Service: Endoscopy;  Laterality: N/A;  7:30 AM  . DILATION AND CURETTAGE OF UTERUS    . ESOPHAGEAL DILATION N/A 09/20/2015   Procedure: ESOPHAGEAL DILATION;  Surgeon: Daneil Dolin, MD;  [facility];  Service: Endoscopy;  Laterality: N/A;  . ESOPHAGOGASTRODUODENOSCOPY  01/28/2011   Izel Hochberg- Schatzki ring, s/p 56F, otherwise normal/Hiatal hernia otherwise normal stomach D1 and D2  . ESOPHAGOGASTRODUODENOSCOPY  11/30/08   Leighana Neyman-Schatzki's ring status post dilation, hiatal hernia  . ESOPHAGOGASTRODUODENOSCOPY  07/12/2007   Dilation with 56 French, Schatzki's ring  . ESOPHAGOGASTRODUODENOSCOPY N/A 08/10/2014   Dr.Emira Eubanks- prominent schatzki's ring s/p maloney dilation and disruption, moderate sized hiatal hernia  . ESOPHAGOGASTRODUODENOSCOPY N/A 09/20/2015   RMR: Schatzki's ring status post dilation as described above. Rather large hiatal  hernia; somewhat baggy atonic esophageal body' otherwise normal EGD  . ESOPHAGOGASTRODUODENOSCOPY N/A 11/19/2016   Procedure: ESOPHAGOGASTRODUODENOSCOPY (EGD);  Surgeon: Daneil Dolin, MD;  [facility];  Service: Endoscopy;  Laterality: N/A;  . ESOPHAGOGASTRODUODENOSCOPY (EGD) WITH ESOPHAGEAL DILATION  09/01/2012   Dr. Gala Romney- schatzki's ring, hiatal hernia  . FOOT ARTHRODESIS  [id number]   both feet  . HERNIA REPAIR     . JOINT REPLACEMENT    . KNEE ARTHROSCOPY WITH LATERAL MENISECTOMY Right 01/27/2014   Procedure: RIGHT KNEE ARTHROSCOPY WITH PARTIAL LATERAL MENISECTOMY, ;  Surgeon: Johnny Bridge, MD;  Location: [facility];  Service: Orthopedics;  Laterality: Right;  . MALONEY DILATION N/A 08/10/2014   Procedure: [name] DILATION;  Surgeon: Daneil Dolin, MD;  [facility];  Service: Endoscopy;  Laterality: N/A;  [name] DILATION N/A 11/19/2016   Procedure: [name] DILATION;  Surgeon: Daneil Dolin, MD;  [facility];  Service: Endoscopy;  Laterality: N/A;  . SAVORY DILATION N/A 08/10/2014   Procedure: SAVORY DILATION;  Surgeon: Daneil Dolin, MD;  [facility];  Service: Endoscopy;  Laterality: N/A;  . SHOULDER OPEN ROTATOR CUFF REPAIR     BILAT  . TONSILLECTOMY    . TOTAL KNEE ARTHROPLASTY Right 12/11/2015   Procedure: TOTAL KNEE ARTHROPLASTY;  Surgeon: Marchia Bond, MD;  Location: [facility];  Service: Orthopedics;  Laterality: Right;    Prior to Admission medications   Medication Sig Start Date End Date Taking? Authorizing Provider  ADVAIR DISKUS 250-50 MCG/DOSE AEPB Inhale 1 puff into the lungs 2 (two) times daily.  08/21/15  Yes [provider]  albuterol (PROVENTIL) (2.5 MG/3ML) 0.083% nebulizer solution Take 2.5 mg by nebulization every 6 (six) hours as needed for wheezing or shortness of breath.   Yes [provider]  albuterol (PROVENTIL,VENTOLIN) 90 MCG/ACT inhaler Inhale 2 puffs into the lungs every 4 (four) hours as needed for wheezing or shortness of breath.    Yes [provider]  aspirin 81 MG chewable tablet Chew 81 mg by mouth daily.   Yes [provider]  azelastine (ASTELIN) 137 MCG/SPRAY nasal spray 2 sprays by Nasal route 2 (two) times daily. Use in each nostril as directed    Yes [provider]  balsalazide (COLAZAL) 750 MG capsule TAKE 3 CAPSULES TWICE A DAY 07/13/17  Yes Annitta Needs, NP    buPROPion (WELLBUTRIN XL) 150 MG 24 hr tablet Take 150 mg by mouth daily. 10/09/15  Yes [provider]  busPIRone (BUSPAR) 15 MG tablet Take 7.5 mg by mouth 2 (two) times daily.    Yes [provider]  Calcium Carb-Cholecalciferol (CALCIUM-VITAMIN D) 600-400 MG-UNIT TABS Take 1 tablet by mouth 2 (two) times daily.   Yes [provider]  cetirizine (ZYRTEC) 10 MG tablet Take 10 mg by mouth daily.   Yes [provider]  DULoxetine (CYMBALTA) 30 MG capsule Take 90 mg by mouth daily.    Yes [provider]  esomeprazole (NEXIUM) 40 MG capsule TAKE 1 CAPSULE EVERY MORNING 06/28/17  Yes Mahala Menghini, PA-C  famotidine-calcium carbonate-magnesium hydroxide (PEPCID COMPLETE) 10-800-165 MG CHEW chewable tablet Chew 1 tablet by mouth daily as needed (for breakthrough GERD).    Yes [provider]  ferrous sulfate 325 (65 FE) MG tablet Take 325 mg by mouth daily with breakfast.   Yes [provider]  ipratropium (ATROVENT) 0.03 % nasal spray Place 2 sprays into both nostrils every  12 (twelve) hours.   Yes [provider]  ketotifen (ALAWAY) 0.025 % ophthalmic solution Place 1 drop into both eyes 2 (two) times daily.    Yes [provider]  metFORMIN (GLUCOPHAGE-XR) 500 MG 24 hr tablet Take 1,000 mg by mouth every evening.   Yes [provider]  methylcellulose (CITRUCEL) oral powder Take 1 packet by mouth daily as needed (constipation).    Yes [provider]  pravastatin (PRAVACHOL) 20 MG tablet TAKE 1 TABLET EVERY EVENING (NEED APPOINTMENT FOR FURTHER REFILLS) 08/14/17  Yes Branch, Alphonse Guild, MD  pregabalin (LYRICA) 100 MG capsule Take 100 mg by mouth 2 (two) times daily.   Yes [provider]  tiZANidine (ZANAFLEX) 4 MG tablet Take 2-4 tablets by mouth every 6 (six) hours as needed for pain or muscle spasms. 10/27/16  Yes [provider]  traMADol (ULTRAM-ER) 100 MG 24 hr tablet Take 100 mg  by mouth at bedtime. 1 hour before sleep   Yes [provider]  traZODone (DESYREL) 100 MG tablet Take 100 mg by mouth at bedtime.     Yes [provider]  dicyclomine (BENTYL) 10 MG capsule TAKE 1 CAPSULE FOUR TIMES A DAY BEFORE MEALS AND AT BEDTIME 06/28/17   Mahala Menghini, PA-C  polyethylene glycol-electrolytes (TRILYTE) 420 g solution Take 4,000 mLs by mouth as directed. Patient not taking: Reported on 11/06/2017 10/24/16   Daneil Dolin, MD    Allergies as of 11/06/2017 - Review Complete 11/06/2017  Allergen Reaction Noted  . Mesalamine Other (See Comments)   . Cefprozil Other (See Comments)   . Cefuroxime axetil Other (See Comments) 08/08/2008  . Morphine and related Hives and Itching 08/23/2012  . Penicillins Hives 08/08/2008  . Aspirin Other (See Comments)   . Chicken allergy Nausea And Vomiting and Rash 11/27/2015  . Clarithromycin Nausea And Vomiting 08/08/2008  . Codeine Other (See Comments)   . Entex Other (See Comments) 08/08/2008    Family History  Problem Relation Age of Onset  . Heart disease Mother   . Atrial fibrillation Sister   . Arrhythmia Brother   . Colon cancer Neg Hx     Social History   Socioeconomic History  . Marital status: Married    Spouse name: Not on file  . Number of children: 3  . Years of education: Not on file  . Highest education level: Not on file  Social Needs  . Financial resource strain: Not on file  . Food insecurity - worry: Not on file  . Food insecurity - inability: Not on file  . Transportation needs - medical: Not on file  . Transportation needs - non-medical: Not on file  Occupational History  . 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 Smoking status: Former Smoker    Packs/day: 1.00    Years: 30.00    Pack years: 30.00    Types: Cigarettes    Start date: 07/07/1969    Last attempt to quit: 08/29/2013    Years since quitting: 4.1  . Smokeless tobacco: Never Used  Substance and  Sexual Activity  . Alcohol use: No    Alcohol/week: 0.0 oz  . Drug use: No  . Sexual activity: Yes    Partners: Male    Birth control/protection: None    Comment: spouse  Other Topics Concern  . Not on file  Social History Narrative  . Not on file    Review of Systems: See HPI, otherwise negative ROS  Physical Exam: BP 120/69   Pulse 94   Temp 98.6 F (37 C) (Oral)   Ht 5' 4"  (1.626 m)   Wt 183 lb 3.2 oz (83.1 kg)   BMI 31.45 kg/m  General:   Alert,  , pleasant and cooperative in NAD Neck:  Supple; no masses or thyromegaly. No significant cervical adenopathy. Lungs:  Clear throughout to auscultation.   No wheezes, crackles, or rhonchi. No acute distress. Heart:  Regular rate and rhythm; no murmurs, clicks, rubs,  or gallops. Abdomen: Non-distended, normal bowel sounds.  Soft and nontender without appreciable mass or hepatosplenomegaly.  Pulses:  Normal pulses noted. Extremities:  Velcro brace on left lower extremity. Rectal:  Good sphincter tone. No mastoid rectal vault. No stool in rectal vault. Mucus Hemoccult-negative.  Impression:   Pleasant 67 year old lady with history of pan ulcerative colitis with a two-month history of loose stools with some blood and clot  -   self-limiting left lower quadrant abdominal pain along the way as well. Previously well-controlled on oral mesalamine. She has been exposed to multiple antibiotics. She is no longer anticoagulated.  GERD symptoms well controlled but recurrent esophageal dysphagia noted.  Recommendations:   We'll go ahead and get blood work and check stool studies.  CBC,CRP and ESR today  Send stool for GIP/Cdiff  I anticipate repeat EGD with dilation  in the near future  Further recommendations to follow in the very near future.        Notice: This dictation was prepared with Dragon dictation along with smaller phrase technology. Any transcriptional errors that result from this process are unintentional and may  not be corrected upon review.

## 2017-11-06 NOTE — Patient Instructions (Signed)
CBC,CRP and ESR today  Send stool for GIP/Cdiff  I anticipate repeat EGD with dilation of your esophagus in the near future  Further recommendations to follow in the very near future.

## 2017-11-09 ENCOUNTER — Other Ambulatory Visit: Payer: Self-pay

## 2017-11-09 DIAGNOSIS — K219 Gastro-esophageal reflux disease without esophagitis: Secondary | ICD-10-CM | POA: Diagnosis not present

## 2017-11-09 DIAGNOSIS — K921 Melena: Secondary | ICD-10-CM | POA: Diagnosis not present

## 2017-11-09 DIAGNOSIS — R197 Diarrhea, unspecified: Secondary | ICD-10-CM | POA: Diagnosis not present

## 2017-11-09 NOTE — Progress Notes (Signed)
Open in error

## 2017-11-10 NOTE — Progress Notes (Signed)
Pt is aware of the blood work and the C-diff and that we are awaiting the GIP.

## 2017-11-10 NOTE — Progress Notes (Signed)
LMOM to call.

## 2017-11-12 LAB — CBC WITH DIFFERENTIAL/PLATELET
Basophils Absolute: 61 cells/uL (ref 0–200)
Basophils Relative: 1 %
Eosinophils Absolute: 214 cells/uL (ref 15–500)
Eosinophils Relative: 3.5 %
HCT: 35.5 % (ref 35.0–45.0)
Hemoglobin: 12.2 g/dL (ref 11.7–15.5)
LYMPHS ABS: 1946 {cells}/uL (ref 850–3900)
MCH: 30.5 pg (ref 27.0–33.0)
MCHC: 34.4 g/dL (ref 32.0–36.0)
MCV: 88.8 fL (ref 80.0–100.0)
MPV: 11.2 fL (ref 7.5–12.5)
Monocytes Relative: 10.4 %
NEUTROS PCT: 53.2 %
Neutro Abs: 3245 cells/uL (ref 1500–7800)
PLATELETS: 266 10*3/uL (ref 140–400)
RBC: 4 10*6/uL (ref 3.80–5.10)
RDW: 13.4 % (ref 11.0–15.0)
TOTAL LYMPHOCYTE: 31.9 %
WBC mixed population: 634 cells/uL (ref 200–950)
WBC: 6.1 10*3/uL (ref 3.8–10.8)

## 2017-11-12 LAB — GASTROINTESTINAL PATHOGEN PANEL PCR
C. DIFFICILE TOX A/B, PCR: NOT DETECTED
CAMPYLOBACTER, PCR: NOT DETECTED
CRYPTOSPORIDIUM, PCR: NOT DETECTED
E coli (ETEC) LT/ST PCR: NOT DETECTED
E coli (STEC) stx1/stx2, PCR: NOT DETECTED
E coli 0157, PCR: NOT DETECTED
Giardia lamblia, PCR: NOT DETECTED
Norovirus, PCR: NOT DETECTED
ROTAVIRUS, PCR: NOT DETECTED
Salmonella, PCR: DETECTED — AB
Shigella, PCR: NOT DETECTED

## 2017-11-12 LAB — C-REACTIVE PROTEIN: CRP: 5.5 mg/L (ref <8.0)

## 2017-11-12 LAB — SEDIMENTATION RATE: Sed Rate: 11 mm/h (ref 0–30)

## 2017-11-12 LAB — CLOSTRIDIUM DIFFICILE TOXIN B, QUALITATIVE, REAL-TIME PCR: Toxigenic C. Difficile by PCR: NOT DETECTED

## 2017-11-13 NOTE — Progress Notes (Signed)
Any recommendations on the salmonella?

## 2017-11-16 ENCOUNTER — Other Ambulatory Visit: Payer: Self-pay

## 2017-11-16 MED ORDER — LEVOFLOXACIN 500 MG PO TABS: 500.0000 mg | ORAL_TABLET | Freq: Every day | ORAL | 0 refills | Status: DC

## 2017-11-17 ENCOUNTER — Encounter: Payer: Self-pay | Admitting: Internal Medicine

## 2017-11-17 NOTE — Progress Notes (Signed)
Patient scheduled and letter sent

## 2017-11-26 DIAGNOSIS — Z6830 Body mass index (BMI) 30.0-30.9, adult: Secondary | ICD-10-CM | POA: Diagnosis not present

## 2017-11-26 DIAGNOSIS — E782 Mixed hyperlipidemia: Secondary | ICD-10-CM | POA: Diagnosis not present

## 2017-11-26 DIAGNOSIS — F321 Major depressive disorder, single episode, moderate: Secondary | ICD-10-CM | POA: Diagnosis not present

## 2017-11-26 DIAGNOSIS — K219 Gastro-esophageal reflux disease without esophagitis: Secondary | ICD-10-CM | POA: Diagnosis not present

## 2017-11-26 DIAGNOSIS — E7800 Pure hypercholesterolemia, unspecified: Secondary | ICD-10-CM | POA: Diagnosis not present

## 2017-11-26 DIAGNOSIS — N189 Chronic kidney disease, unspecified: Secondary | ICD-10-CM | POA: Diagnosis not present

## 2017-11-26 DIAGNOSIS — Z0001 Encounter for general adult medical examination with abnormal findings: Secondary | ICD-10-CM | POA: Diagnosis not present

## 2017-11-26 DIAGNOSIS — G4733 Obstructive sleep apnea (adult) (pediatric): Secondary | ICD-10-CM | POA: Diagnosis not present

## 2017-11-26 DIAGNOSIS — E119 Type 2 diabetes mellitus without complications: Secondary | ICD-10-CM | POA: Diagnosis not present

## 2017-11-26 DIAGNOSIS — E1165 Type 2 diabetes mellitus with hyperglycemia: Secondary | ICD-10-CM | POA: Diagnosis not present

## 2017-11-27 DIAGNOSIS — J0100 Acute maxillary sinusitis, unspecified: Secondary | ICD-10-CM | POA: Diagnosis not present

## 2017-11-27 DIAGNOSIS — Z6831 Body mass index (BMI) 31.0-31.9, adult: Secondary | ICD-10-CM | POA: Diagnosis not present

## 2017-12-01 ENCOUNTER — Telehealth: Payer: Self-pay

## 2017-12-01 ENCOUNTER — Ambulatory Visit (INDEPENDENT_AMBULATORY_CARE_PROVIDER_SITE_OTHER): Payer: Medicare Other | Admitting: Internal Medicine

## 2017-12-01 ENCOUNTER — Encounter: Payer: Self-pay | Admitting: Internal Medicine

## 2017-12-01 VITALS — BP 118/72 | HR 93 | Temp 99.2°F | Ht 64.0 in | Wt 183.0 lb

## 2017-12-01 DIAGNOSIS — Z6831 Body mass index (BMI) 31.0-31.9, adult: Secondary | ICD-10-CM | POA: Diagnosis not present

## 2017-12-01 DIAGNOSIS — J4520 Mild intermittent asthma, uncomplicated: Secondary | ICD-10-CM | POA: Diagnosis not present

## 2017-12-01 DIAGNOSIS — E782 Mixed hyperlipidemia: Secondary | ICD-10-CM | POA: Diagnosis not present

## 2017-12-01 DIAGNOSIS — K51011 Ulcerative (chronic) pancolitis with rectal bleeding: Secondary | ICD-10-CM

## 2017-12-01 DIAGNOSIS — M1990 Unspecified osteoarthritis, unspecified site: Secondary | ICD-10-CM | POA: Diagnosis not present

## 2017-12-01 DIAGNOSIS — K219 Gastro-esophageal reflux disease without esophagitis: Secondary | ICD-10-CM

## 2017-12-01 DIAGNOSIS — E119 Type 2 diabetes mellitus without complications: Secondary | ICD-10-CM | POA: Diagnosis not present

## 2017-12-01 DIAGNOSIS — N189 Chronic kidney disease, unspecified: Secondary | ICD-10-CM | POA: Diagnosis not present

## 2017-12-01 DIAGNOSIS — K5190 Ulcerative colitis, unspecified, without complications: Secondary | ICD-10-CM | POA: Diagnosis not present

## 2017-12-01 NOTE — Telephone Encounter (Signed)
Patient called back and stated she did receive the VM.

## 2017-12-01 NOTE — Progress Notes (Signed)
Primary Care Physician:  Riley Lam Primary Gastroenterologist:  Dr. Gala Romney  Pre-Procedure History & Physical: HPI:  [name] is a 67 y.o. female here for follow-up of history of pan UC. Endoscopically /histologically last year: Looked really good. On colazal twice a day. Quite a bit of trouble with diarrhea recently some bleeding. GI pathogen panel C. difficile assay came back positive for salmonella given underlying UC and protracted symptoms she was given a course of Levaquin. Transient improvement then relapsed in her symptoms. Frustrated with loose stools tearful at times. Some nocturnal diarrhea as well. Her sedimentation rate and CRP was normal recently. She has been on metformin for some time.  Patient does have some recurrence of dysphagia ; sedimentation rate and crp nl recently.  Reflux symptoms well controlled on current regimen of Nexium..  Past Medical History:  Diagnosis Date  . Anemia   . Anginal pain ([place])    PATIENT STATES DOCT THINKS IS FIBROMYALGIA  . Arthritis   . Asthma   . Blood clot in lung last march  . Cancer ([place])    1980  CERVICAL  . Chronic back pain   . COPD (chronic obstructive pulmonary disease) ([place])   . DDD (degenerative disc disease)   . Degenerative tear of posterior horn of lateral meniscus of right knee 01/27/2014  . Depression   . Diabetes mellitus without complication ([place])   . Diverticulitis   . Fibromyalgia   . GERD (gastroesophageal reflux disease)   . Headache    HX MIGRAINES    . Hiatal hernia   . HOH (hard of hearing)    right  . Pancreatitis   . Pneumonia    1 YR  . PONV (postoperative nausea and vomiting)    only happened after knee arthroscopy  . Primary localized osteoarthritis of right knee 12/11/2015  . PVC's (premature ventricular contractions)   . Schatzki's ring   . Shortness of breath dyspnea    WITH EXERTION   . Sleep apnea    CPAP at night  . Ulcerative colitis 2007  . Wears dentures    full  top-partial bottom    Past Surgical History:  Procedure Laterality Date  . arch and foot    . CERVICAL CONE BIOPSY    . CHOLECYSTECTOMY    . COLONOSCOPY  12/03/05   Annamaria Salah-marked inflammatory changes of the rectum, left colon, pan colitis, internal hemorrhoids, sigmoid diverticula  . COLONOSCOPY N/A 10/20/2013   Dr. Cameryn Chrisley:Colonic diverticulosis. colonic mucosal ulcerations involving segment of sigmoid colon, atypical for UC. sigmoid colon path with chronic mildly active colitis consistent with IBD, other biopsies including ascending, transverse, descending colon and rectum unremarkable.   . COLONOSCOPY N/A 11/19/2016   Procedure: COLONOSCOPY;  Surgeon: Daneil Dolin, MD;  [facility];  Service: Endoscopy;  Laterality: N/A;  7:30 AM  . DILATION AND CURETTAGE OF UTERUS    . ESOPHAGEAL DILATION N/A 09/20/2015   Procedure: ESOPHAGEAL DILATION;  Surgeon: Daneil Dolin, MD;  [facility];  Service: Endoscopy;  Laterality: N/A;  . ESOPHAGOGASTRODUODENOSCOPY  01/28/2011   Jaasiel [place]- Schatzki ring, s/p 34F, otherwise normal/Hiatal hernia otherwise normal stomach D1 and D2  . ESOPHAGOGASTRODUODENOSCOPY  11/30/08   Trishelle Devora-Schatzki's ring status post dilation, hiatal hernia  . ESOPHAGOGASTRODUODENOSCOPY  07/12/2007   Dilation with 56 French, Schatzki's ring  . ESOPHAGOGASTRODUODENOSCOPY N/A 08/10/2014   Dr.Jerilynn Feldmeier- prominent schatzki's ring s/p maloney dilation and disruption, moderate sized hiatal hernia  . ESOPHAGOGASTRODUODENOSCOPY N/A  09/20/2015   RMR: Schatzki's ring status post dilation as described above. Rather large hiatal hernia; somewhat baggy atonic esophageal body' otherwise normal EGD  . ESOPHAGOGASTRODUODENOSCOPY N/A 11/19/2016   Procedure: ESOPHAGOGASTRODUODENOSCOPY (EGD);  Surgeon: Daneil Dolin, MD;  [facility];  Service: Endoscopy;  Laterality: N/A;  . ESOPHAGOGASTRODUODENOSCOPY (EGD) WITH ESOPHAGEAL DILATION  09/01/2012   Dr. Gala Romney- schatzki's ring, hiatal  hernia  . FOOT ARTHRODESIS  [id number]   both feet  . HERNIA REPAIR    . JOINT REPLACEMENT    . KNEE ARTHROSCOPY WITH LATERAL MENISECTOMY Right 01/27/2014   Procedure: RIGHT KNEE ARTHROSCOPY WITH PARTIAL LATERAL MENISECTOMY, ;  Surgeon: Johnny Bridge, MD;  Location: [facility];  Service: Orthopedics;  Laterality: Right;  . MALONEY DILATION N/A 08/10/2014   Procedure: [name] DILATION;  Surgeon: Daneil Dolin, MD;  [facility];  Service: Endoscopy;  Laterality: N/A;  [name] DILATION N/A 11/19/2016   Procedure: [name] DILATION;  Surgeon: Daneil Dolin, MD;  [facility];  Service: Endoscopy;  Laterality: N/A;  . SAVORY DILATION N/A 08/10/2014   Procedure: SAVORY DILATION;  Surgeon: Daneil Dolin, MD;  [facility];  Service: Endoscopy;  Laterality: N/A;  . SHOULDER OPEN ROTATOR CUFF REPAIR     BILAT  . TONSILLECTOMY    . TOTAL KNEE ARTHROPLASTY Right 12/11/2015   Procedure: TOTAL KNEE ARTHROPLASTY;  Surgeon: Marchia Bond, MD;  Location: [facility];  Service: Orthopedics;  Laterality: Right;    Prior to Admission medications   Medication Sig Start Date End Date Taking? Authorizing Provider  ADVAIR DISKUS 250-50 MCG/DOSE AEPB Inhale 1 puff into the lungs 2 (two) times daily.  08/21/15  Yes [provider]  albuterol (PROVENTIL) (2.5 MG/3ML) 0.083% nebulizer solution Take 2.5 mg by nebulization every 6 (six) hours as needed for wheezing or shortness of breath.   Yes [provider]  albuterol (PROVENTIL,VENTOLIN) 90 MCG/ACT inhaler Inhale 2 puffs into the lungs every 4 (four) hours as needed for wheezing or shortness of breath.    Yes [provider]  aspirin 81 MG chewable tablet Chew 81 mg by mouth daily.   Yes [provider]  azelastine (ASTELIN) 137 MCG/SPRAY nasal spray 2 sprays by Nasal route 2 (two) times daily. Use in each nostril as directed    Yes [provider]  balsalazide (COLAZAL) 750  MG capsule TAKE 3 CAPSULES TWICE A DAY 07/13/17  Yes Annitta Needs, NP  buPROPion (WELLBUTRIN XL) 150 MG 24 hr tablet Take 150 mg by mouth daily. 10/09/15  Yes [provider]  busPIRone (BUSPAR) 15 MG tablet Take 7.5 mg by mouth 2 (two) times daily.    Yes [provider]  Calcium Carb-Cholecalciferol (CALCIUM-VITAMIN D) 600-400 MG-UNIT TABS Take 1 tablet by mouth 2 (two) times daily.   Yes [provider]  cetirizine (ZYRTEC) 10 MG tablet Take 10 mg by mouth daily.   Yes [provider]  dicyclomine (BENTYL) 10 MG capsule TAKE 1 CAPSULE FOUR TIMES A DAY BEFORE MEALS AND AT BEDTIME 06/28/17  Yes Mahala Menghini, PA-C  DULoxetine (CYMBALTA) 30 MG capsule Take 90 mg by mouth daily.    Yes [provider]  esomeprazole (NEXIUM) 40 MG capsule TAKE 1 CAPSULE EVERY MORNING 06/28/17  Yes Mahala Menghini, PA-C  famotidine-calcium carbonate-magnesium hydroxide (PEPCID COMPLETE) 10-800-165 MG CHEW chewable tablet Chew 1 tablet by mouth daily as needed (for breakthrough GERD).    Yes  [provider]  ferrous sulfate 325 (65 FE) MG tablet Take 325 mg by mouth daily with breakfast.   Yes [provider]  ipratropium (ATROVENT) 0.03 % nasal spray Place 2 sprays into both nostrils every 12 (twelve) hours.   Yes [provider]  ketotifen (ALAWAY) 0.025 % ophthalmic solution Place 1 drop into both eyes 2 (two) times daily.    Yes [provider]  metFORMIN (GLUCOPHAGE-XR) 500 MG 24 hr tablet Take 1,000 mg by mouth every evening.   Yes [provider]  methylcellulose (CITRUCEL) oral powder Take 1 packet by mouth daily as needed (constipation).    Yes [provider]  Polyvinyl Alcohol (LUBRICANT DROPS OP) Apply 2 drops to eye 2 (two) times daily.   Yes [provider]  pravastatin (PRAVACHOL) 20 MG tablet TAKE 1 TABLET EVERY EVENING (NEED APPOINTMENT FOR FURTHER REFILLS) 08/14/17  Yes Branch, Alphonse Guild, MD    pregabalin (LYRICA) 100 MG capsule Take 100 mg by mouth 2 (two) times daily.   Yes [provider]  traMADol (ULTRAM-ER) 100 MG 24 hr tablet Take 100 mg by mouth at bedtime. 1 hour before sleep   Yes [provider]  levofloxacin (LEVAQUIN) 500 MG tablet Take 1 tablet (500 mg total) by mouth daily. Patient not taking: Reported on 12/01/2017 11/16/17   Daneil Dolin, MD  polyethylene glycol-electrolytes (TRILYTE) 420 g solution Take 4,000 mLs by mouth as directed. Patient not taking: Reported on 11/06/2017 10/24/16   Daneil Dolin, MD  tiZANidine (ZANAFLEX) 4 MG tablet Take 2-4 tablets by mouth every 6 (six) hours as needed for pain or muscle spasms. 10/27/16   [provider]  traZODone (DESYREL) 100 MG tablet Take 100 mg by mouth at bedtime.      [provider]    Allergies as of 12/01/2017 - Review Complete 12/01/2017  Allergen Reaction Noted  . Mesalamine Other (See Comments)   . Cefprozil Other (See Comments)   . Cefuroxime axetil Other (See Comments) 08/08/2008  . Morphine and related Hives and Itching 08/23/2012  . Penicillins Hives 08/08/2008  . Aspirin Other (See Comments)   . Chicken allergy Nausea And Vomiting and Rash 11/27/2015  . Clarithromycin Nausea And Vomiting 08/08/2008  . Codeine Other (See Comments)   . Entex Other (See Comments) 08/08/2008    Family History  Problem Relation Age of Onset  . Heart disease Mother   . Atrial fibrillation Sister   . Arrhythmia Brother   . Colon cancer Neg Hx     Social History   Socioeconomic History  . Marital status: Married    Spouse name: Not on file  . Number of children: 3  . Years of education: Not on file  . Highest education level: Not on file  Social Needs  . Financial resource strain: Not on file  . Food insecurity - worry: Not on file  . Food insecurity - inability: Not on file  . Transportation needs - medical: Not on file  . Transportation needs - non-medical: Not on file   Occupational History  . 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 Smoking status: Former Smoker    Packs/day: 1.00    Years: 30.00    Pack years: 30.00    Types: Cigarettes    Start date: 07/07/1969    Last attempt to quit: 08/29/2013    Years since quitting: 4.2  . Smokeless tobacco: Never Used  Substance and Sexual Activity  .  Alcohol use: No    Alcohol/week: 0.0 oz  . Drug use: No  . Sexual activity: Yes    Partners: Male    Birth control/protection: None    Comment: spouse  Other Topics Concern  . Not on file  Social History Narrative  . Not on file    Review of Systems: See HPI, otherwise negative ROS  Physical Exam: BP 118/72   Pulse 93   Temp 99.2 F (37.3 C) (Oral)   Ht 5' 4"  (1.626 m)   Wt 183 lb (83 kg)   BMI 31.41 kg/m  General:   Alert,  pleasant and cooperative in somewhat anxious but in NAD Neck:  Supple; no masses or thyromegaly. No significant cervical adenopathy. Lungs:  Clear throughout to auscultation.   No wheezes, crackles, or rhonchi. No acute distress. Heart:  Regular rate and rhythm; no murmurs, clicks, rubs,  or gallops. Abdomen: Non-distended, normal bowel sounds.  Soft and nontender without appreciable mass or hepatosplenomegaly.  Pulses:  Normal pulses noted. Extremities:  Without clubbing or edema.  Impression:  Ongoing diarrhea the patient recently found to have Salmonella superimposed on UC felt to be in remission. Treated with 10 day course of Levaquin with transient improvement but now relapse. UC felt to be well in remission last year based on colonoscopy with histology.  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 Patient continues to take metformin.  Reflux symptoms well controlled. Dysphagia will need to be dealt with at some point in the near future.  Recommendations:  Office visit in 1 month  Continue Nexium  Continue Colazal at current dose for now  Further recommendations to follow     Notice: This  dictation was prepared with Dragon dictation along with smaller phrase technology. Any transcriptional errors that result from this process are unintentional and may not be

## 2017-12-01 NOTE — Telephone Encounter (Signed)
I called pt's PCP, [name], PA at [facility]. She said it will be OK for pt to stop the Metformin x 2 weeks for a trial per Dr. Roseanne Kaufman request. I called and left Vm on both phones for pt that she may stop and to please give [place] a call and let us know she got the message. I told her that Amy said for her to continue checking her BS as she has been doing.

## 2017-12-01 NOTE — Patient Instructions (Addendum)
Office visit in 1 month  Will ask about stopping metformin as a trial  Continue Nexium  Continue Colazal at current dose for now  Further recommendations to follow

## 2017-12-01 NOTE — Telephone Encounter (Signed)
FYI to Dr. Gala Romney.

## 2017-12-02 NOTE — Telephone Encounter (Signed)
noted 

## 2017-12-14 DIAGNOSIS — Z6831 Body mass index (BMI) 31.0-31.9, adult: Secondary | ICD-10-CM | POA: Diagnosis not present

## 2017-12-14 DIAGNOSIS — J0101 Acute recurrent maxillary sinusitis: Secondary | ICD-10-CM | POA: Diagnosis not present

## 2017-12-23 DIAGNOSIS — R251 Tremor, unspecified: Secondary | ICD-10-CM | POA: Diagnosis not present

## 2017-12-23 DIAGNOSIS — E119 Type 2 diabetes mellitus without complications: Secondary | ICD-10-CM | POA: Diagnosis not present

## 2017-12-23 DIAGNOSIS — Z6831 Body mass index (BMI) 31.0-31.9, adult: Secondary | ICD-10-CM | POA: Diagnosis not present

## 2017-12-23 DIAGNOSIS — R197 Diarrhea, unspecified: Secondary | ICD-10-CM | POA: Diagnosis not present

## 2017-12-25 DIAGNOSIS — Z4889 Encounter for other specified surgical aftercare: Secondary | ICD-10-CM | POA: Diagnosis not present

## 2018-01-06 ENCOUNTER — Encounter: Payer: Self-pay | Admitting: Gastroenterology

## 2018-01-06 ENCOUNTER — Telehealth: Payer: Self-pay | Admitting: *Deleted

## 2018-01-06 ENCOUNTER — Encounter: Payer: Self-pay | Admitting: *Deleted

## 2018-01-06 ENCOUNTER — Ambulatory Visit (INDEPENDENT_AMBULATORY_CARE_PROVIDER_SITE_OTHER): Payer: Medicare Other | Admitting: Gastroenterology

## 2018-01-06 ENCOUNTER — Other Ambulatory Visit: Payer: Self-pay | Admitting: *Deleted

## 2018-01-06 VITALS — BP 146/75 | HR 88 | Temp 98.0°F | Ht 64.0 in | Wt 179.0 lb

## 2018-01-06 DIAGNOSIS — R1310 Dysphagia, unspecified: Secondary | ICD-10-CM

## 2018-01-06 DIAGNOSIS — R1319 Other dysphagia: Secondary | ICD-10-CM

## 2018-01-06 DIAGNOSIS — K5100 Ulcerative (chronic) pancolitis without complications: Secondary | ICD-10-CM

## 2018-01-06 DIAGNOSIS — R197 Diarrhea, unspecified: Secondary | ICD-10-CM

## 2018-01-06 NOTE — Assessment & Plan Note (Signed)
Recurrent esophageal dysphagia with history of multiple dilations in the past for Schatzki's ring.  Last EGD with dilation 1 year ago.  Good response with dilations, recurrent symptoms about 2 months ago.  Plan for EGD with dilation in the near future.  I have discussed the risks, alternatives, benefits with regards to but not limited to the risk of reaction to medication, bleeding, infection, perforation and the patient is agreeable to proceed. Written consent to be obtained.

## 2018-01-06 NOTE — Progress Notes (Signed)
Primary Care Physician: Riley Lam  Primary Gastroenterologist:  Garfield Cornea, MD   Chief Complaint  Patient presents with  . Gastroesophageal Reflux  . Diarrhea    off of Metformin for now  . Dysphagia  . Abdominal Pain    cramping    HPI: [name] is a 67 y.o. female history of pan ulcerative colitis, recent Salmonella with protracted diarrhea who presents for follow-up.  Last seen in March.  After that visit she was advised to consider coming off metformin if okay with her prescribing provider to see if that would help her abdominal cramps and diarrhea.  Patient states she has been off metformin for about 5 weeks and noticed marked improvement in her bowel movements almost immediately when she stopped the medication.  She also notes that she did try restarting because of elevated glucose but had immediate return of her diarrhea and abdominal cramps and therefore came off the medication.  She will be following up with PCP in June to decide further diabetes management.  She continues Colazal 2.25g BID, Bentyl 86m QID. Currently having BM 2-3 times per day. No further bleeding. Certain foods make stools worse. Vegetable beef soup with grill cheese at certain restaurant, onion rings for example. No nocturnal stools. Most stools before noon.  Continues to have esophageal dysphagia primarily to pills.  History of Schatzki ring requiring disruption with biopsy forceps 1 year ago.  Describes prolonged improvement of her symptoms up until about 2 months ago.  Has had to vomit pills after they become stuck. No heartburn.     Current Outpatient Medications  Medication Sig Dispense Refill  . ADVAIR DISKUS 250-50 MCG/DOSE AEPB Inhale 1 puff into the lungs 2 (two) times daily.     .Marland Kitchenalbuterol (PROVENTIL) (2.5 MG/3ML) 0.083% nebulizer solution Take 2.5 mg by nebulization every 6 (six) hours as needed for wheezing or shortness of breath.    .Marland Kitchenalbuterol (PROVENTIL,VENTOLIN) 90  MCG/ACT inhaler Inhale 2 puffs into the lungs every 4 (four) hours as needed for wheezing or shortness of breath.     .Marland Kitchenaspirin 81 MG chewable tablet Chew 81 mg by mouth daily.    .Marland Kitchenazelastine (ASTELIN) 137 MCG/SPRAY nasal spray 2 sprays by Nasal route 2 (two) times daily. Use in each nostril as directed     . balsalazide (COLAZAL) 750 MG capsule TAKE 3 CAPSULES TWICE A DAY 540 capsule 3  . buPROPion (WELLBUTRIN XL) 150 MG 24 hr tablet Take 150 mg by mouth daily.    . busPIRone (BUSPAR) 15 MG tablet Take 7.5 mg by mouth 2 (two) times daily.     . Calcium Carb-Cholecalciferol (CALCIUM-VITAMIN D) 600-400 MG-UNIT TABS Take 1 tablet by mouth 2 (two) times daily.    . cetirizine (ZYRTEC) 10 MG tablet Take 10 mg by mouth daily.    .Marland Kitchendicyclomine (BENTYL) 10 MG capsule TAKE 1 CAPSULE FOUR TIMES A DAY BEFORE MEALS AND AT BEDTIME 360 capsule 3  . DULoxetine (CYMBALTA) 30 MG capsule Take 90 mg by mouth daily.     .Marland Kitchenesomeprazole (NEXIUM) 40 MG capsule TAKE 1 CAPSULE EVERY MORNING 90 capsule 3  . famotidine-calcium carbonate-magnesium hydroxide (PEPCID COMPLETE) 10-800-165 MG CHEW chewable tablet Chew 1 tablet by mouth daily as needed (for breakthrough GERD).     . ferrous sulfate 325 (65 FE) MG tablet Take 325 mg by mouth daily with breakfast.    . ipratropium (ATROVENT) 0.03 % nasal spray Place 2  sprays into both nostrils every 12 (twelve) hours.    Marland Kitchen ketotifen (ALAWAY) 0.025 % ophthalmic solution Place 1 drop into both eyes 2 (two) times daily.     . methylcellulose (CITRUCEL) oral powder Take 1 packet by mouth daily as needed (constipation).     . Polyvinyl Alcohol (LUBRICANT DROPS OP) Apply 2 drops to eye 2 (two) times daily.    . pravastatin (PRAVACHOL) 20 MG tablet TAKE 1 TABLET EVERY EVENING (NEED APPOINTMENT FOR FURTHER REFILLS) 90 tablet 3  . pregabalin (LYRICA) 100 MG capsule Take 100 mg by mouth 2 (two) times daily.    Marland Kitchen tiZANidine (ZANAFLEX) 4 MG tablet Take 2-4 tablets by mouth every 6 (six)  hours as needed for pain or muscle spasms.  1  . traMADol (ULTRAM-ER) 100 MG 24 hr tablet Take 100 mg by mouth at bedtime. 1 hour before sleep    . traZODone (DESYREL) 100 MG tablet Take 100 mg by mouth at bedtime.       No current facility-administered medications for this visit.     Allergies as of 01/06/2018 - Review Complete 01/06/2018  Allergen Reaction Noted  . Mesalamine Other (See Comments)   . Cefprozil Other (See Comments)   . Cefuroxime axetil Other (See Comments) 08/08/2008  . Morphine and related Hives and Itching 08/23/2012  . Penicillins Hives 08/08/2008  . Aspirin Other (See Comments)   . Chicken allergy Nausea And Vomiting and Rash 11/27/2015  . Clarithromycin Nausea And Vomiting 08/08/2008  . Codeine Other (See Comments)   . Entex Other (See Comments) 08/08/2008   Past Medical History:  Diagnosis Date  . Anemia   . Anginal pain ([place])    PATIENT STATES DOCT THINKS IS FIBROMYALGIA  . Arthritis   . Asthma   . Blood clot in lung last march  . Cancer ([place])    1980  CERVICAL  . Chronic back pain   . COPD (chronic obstructive pulmonary disease) ([place])   . DDD (degenerative disc disease)   . Degenerative tear of posterior horn of lateral meniscus of right knee 01/27/2014  . Depression   . Diabetes mellitus without complication ([place])   . Diverticulitis   . Fibromyalgia   . GERD (gastroesophageal reflux disease)   . Headache    HX MIGRAINES    . Hiatal hernia   . HOH (hard of hearing)    right  . Pancreatitis   . Pneumonia    1 YR  . PONV (postoperative nausea and vomiting)    only happened after knee arthroscopy  . Primary localized osteoarthritis of right knee 12/11/2015  . PVC's (premature ventricular contractions)   . Schatzki's ring   . Shortness of breath dyspnea    WITH EXERTION   . Sleep apnea    CPAP at night  . Ulcerative colitis 2007  . Wears dentures    full top-partial bottom   Past Surgical History:  Procedure Laterality Date  . arch and  foot    . CERVICAL [place]E BIOPSY    . CHOLECYSTECTOMY    . COLONOSCOPY  12/03/05   Rourk-marked inflammatory changes of the rectum, left colon, pan colitis, internal hemorrhoids, sigmoid diverticula  . COLONOSCOPY N/A 10/20/2013   Dr. Rourk:Colonic diverticulosis. colonic mucosal ulcerations involving segment of sigmoid colon, atypical for UC. sigmoid colon path with chronic mildly active colitis consistent with IBD, other biopsies including ascending, transverse, descending colon and rectum unremarkable.   . COLONOSCOPY N/A 11/19/2016   Dr. Gala Romney: Diverticulosis, 7 millimeter  polyp removed from the descending colon, internal hemorrhoids, segmental biopsies unremarkable.  Marland Kitchen DILATION AND CURETTAGE OF UTERUS    . ESOPHAGEAL DILATION N/A 09/20/2015   Procedure: ESOPHAGEAL DILATION;  Surgeon: Daneil Dolin, MD;  [facility];  Service: Endoscopy;  Laterality: N/A;  . ESOPHAGOGASTRODUODENOSCOPY  01/28/2011   Rourk- Schatzki ring, s/p 53F, otherwise normal/Hiatal hernia otherwise normal stomach D1 and D2  . ESOPHAGOGASTRODUODENOSCOPY  11/30/08   Rourk-Schatzki's ring status post dilation, hiatal hernia  . ESOPHAGOGASTRODUODENOSCOPY  07/12/2007   Dilation with 56 French, Schatzki's ring  . ESOPHAGOGASTRODUODENOSCOPY N/A 08/10/2014   Dr.Rourk- prominent schatzki's ring s/p maloney dilation and disruption, moderate sized hiatal hernia  . ESOPHAGOGASTRODUODENOSCOPY N/A 09/20/2015   RMR: Schatzki's ring status post dilation as described above. Rather large hiatal hernia; somewhat baggy atonic esophageal body' otherwise normal EGD  . ESOPHAGOGASTRODUODENOSCOPY N/A 11/19/2016   Dr. Gala Romney: moderate Schatzki ring s/p dirsuption with biopsy forceps, large hiatal hernia  . ESOPHAGOGASTRODUODENOSCOPY (EGD) WITH ESOPHAGEAL DILATION  09/01/2012   Dr. Gala Romney- schatzki's ring, hiatal hernia  . FOOT ARTHRODESIS  [id number]   both feet  . HERNIA REPAIR    . JOINT REPLACEMENT    . KNEE ARTHROSCOPY WITH LATERAL  MENISECTOMY Right 01/27/2014   Procedure: RIGHT KNEE ARTHROSCOPY WITH PARTIAL LATERAL MENISECTOMY, ;  Surgeon: Johnny Bridge, MD;  Location: [facility];  Service: Orthopedics;  Laterality: Right;  . MALONEY DILATION N/A 08/10/2014   Procedure: [name] DILATION;  Surgeon: Daneil Dolin, MD;  [facility];  Service: Endoscopy;  Laterality: N/A;  [name] DILATION N/A 11/19/2016   Procedure: [name] DILATION;  Surgeon: Daneil Dolin, MD;  [facility];  Service: Endoscopy;  Laterality: N/A;  . SAVORY DILATION N/A 08/10/2014   Procedure: SAVORY DILATION;  Surgeon: Daneil Dolin, MD;  [facility];  Service: Endoscopy;  Laterality: N/A;  . SHOULDER OPEN ROTATOR CUFF REPAIR     BILAT  . TONSILLECTOMY    . TOTAL KNEE ARTHROPLASTY Right 12/11/2015   Procedure: TOTAL KNEE ARTHROPLASTY;  Surgeon: Marchia Bond, MD;  Location: [facility];  Service: Orthopedics;  Laterality: Right;   Family History  Problem Relation Age of Onset  . Heart disease Mother   . Atrial fibrillation Sister   . Arrhythmia Brother   . Colon cancer Neg Hx    Social History   Tobacco Use  . Smoking status: Former Smoker    Packs/day: 1.00    Years: 30.00    Pack years: 30.00    Types: Cigarettes    Start date: 07/07/1969    Last attempt to quit: 08/29/2013    Years since quitting: 4.3  . Smokeless tobacco: Never Used  Substance Use Topics  . Alcohol use: No    Alcohol/week: 0.0 oz  . Drug use: No     ROS:  General: Negative for anorexia, weight loss, fever, chills, fatigue, weakness. ENT: Negative for hoarseness,  nasal congestion. See hpi CV: Negative for chest pain, angina, palpitations, dyspnea on exertion, peripheral edema.  Respiratory: Negative for dyspnea at rest, dyspnea on exertion, cough, sputum, wheezing.  GI: See history of present illness. GU:  Negative for dysuria, hematuria, urinary incontinence, urinary frequency, nocturnal urination.  Endo: Negative  for unusual weight change.    Physical Examination:   BP (!) 146/75   Pulse 88   Temp 98 F (36.7 C) (Oral)   Ht 5' 4"  (1.626 m)   Wt 179 lb (81.2 kg)  BMI 30.73 kg/m   General: Well-nourished, well-developed in no acute distress.  Eyes: No icterus. Mouth: Oropharyngeal mucosa moist and pink , no lesions erythema or exudate. Lungs: Clear to auscultation bilaterally.  Heart: Regular rate and rhythm, no murmurs rubs or gallops.  Abdomen: Bowel sounds are normal, nontender, nondistended, no hepatosplenomegaly or masses, no abdominal bruits or hernia , no rebound or guarding.   Extremities: No lower extremity edema. No clubbing or deformities. Neuro: Alert and oriented x 4   Skin: Warm and dry, no jaundice.   Psych: Alert and cooperative, normal mood and affect.  Labs:  Lab Results  Component Value Date   WBC 6.1 11/09/2017   HGB 12.2 11/09/2017   HCT 35.5 11/09/2017   MCV 88.8 11/09/2017   PLT 266 11/09/2017    Imaging Studies: No results found.

## 2018-01-06 NOTE — Patient Instructions (Signed)
1. Upper endoscopy as scheduled. See separate instructions.  2. Return to see Dr. Gala Romney in four months. Call sooner if you have recurrent issues with diarrhea or any other needs.

## 2018-01-06 NOTE — Assessment & Plan Note (Signed)
Appears to be in good remission with endoscopically normal-appearing colon as well as on biopsies 1 year ago.  Recent bloody diarrhea with positive Salmonella on stool studies, was treated with Levaquin.  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  Fortunately her symptoms have settled down now that she is off metformin.  She has a follow-up with her PCP in 2 months to discuss further management of her diabetes but for now she is staying off metformin.  She will call if she has any further problems.  Otherwise with OV with Dr. Gala Romney in 4 months.

## 2018-01-06 NOTE — Telephone Encounter (Signed)
Pre-op scheduled for 02/02/18 Tuesday at 1:45pm Letter mailed. Patient aware

## 2018-01-07 ENCOUNTER — Ambulatory Visit: Payer: Medicare Other | Admitting: Gastroenterology

## 2018-01-07 NOTE — Progress Notes (Signed)
CC'ED TO PCP 

## 2018-01-20 DIAGNOSIS — L818 Other specified disorders of pigmentation: Secondary | ICD-10-CM | POA: Diagnosis not present

## 2018-01-20 DIAGNOSIS — D2239 Melanocytic nevi of other parts of face: Secondary | ICD-10-CM | POA: Diagnosis not present

## 2018-01-20 DIAGNOSIS — D485 Neoplasm of uncertain behavior of skin: Secondary | ICD-10-CM | POA: Diagnosis not present

## 2018-01-20 DIAGNOSIS — D1800 Hemangioma unspecified site: Secondary | ICD-10-CM | POA: Diagnosis not present

## 2018-01-20 DIAGNOSIS — L570 Actinic keratosis: Secondary | ICD-10-CM | POA: Diagnosis not present

## 2018-01-28 NOTE — Patient Instructions (Signed)
[name]  01/28/2018     @PREFPERIOPPHARMACY @   Your procedure is scheduled on  02/08/2018.  Report to [facility] at  830   A.M.  Call this number if you have problems the morning of surgery:  [phone]   Remember:  Do not eat food or drink liquids after midnight.  Take these medicines the morning of surgery with A SIP OF WATER  Balsalazide, bupropion, buspar, zyrtec, cymbalta, nexium, lyrica, zanaflex. Use your inhalers and your nebulizer before you come.   Do not wear jewelry, make-up or nail polish.  Do not wear lotions, powders, or perfumes, or deodorant.  Do not shave 48 hours prior to surgery.  Men may shave face and neck.  Do not bring valuables to the hospital.  [facility] is not responsible for any belongings or valuables.  Contacts, dentures or bridgework may not be worn into surgery.  Leave your suitcase in the car.  After surgery it may be brought to your room.  For patients admitted to the hospital, discharge time will be determined by your treatment team.  Patients discharged the day of surgery will not be allowed to drive home.   Name and phone number of your driver:   family Special instructions:  Follow the diet and prep instructions given to you by Dr Roseanne Kaufman office.  Please read over the following fact sheets that you were given. Anesthesia Post-op Instructions and Care and Recovery After Surgery       Esophagogastroduodenoscopy Esophagogastroduodenoscopy (EGD) is a procedure to examine the lining of the esophagus, stomach, and first part of the small intestine (duodenum). This procedure is done to check for problems such as inflammation, bleeding, ulcers, or growths. During this procedure, a long, flexible, lighted tube with a camera attached (endoscope) is inserted down the throat. Tell a health care provider about:  Any allergies you have.  All medicines you are taking, including vitamins, herbs, eye drops, creams, and  over-the-counter medicines.  Any problems you or family members have had with anesthetic medicines.  Any blood disorders you have.  Any surgeries you have had.  Any medical conditions you have.  Whether you are pregnant or may be pregnant. What are the risks? Generally, this is a safe procedure. However, problems may occur, including:  Infection.  Bleeding.  A tear (perforation) in the esophagus, stomach, or duodenum.  Trouble breathing.  Excessive sweating.  Spasms of the larynx.  A slowed heartbeat.  Low blood pressure.  What happens before the procedure?  Follow instructions from your health care provider about eating or drinking restrictions.  Ask your health care provider about: ? Changing or stopping your regular medicines. This is especially important if you are taking diabetes medicines or blood thinners. ? Taking medicines such as aspirin and ibuprofen. These medicines can thin your blood. Do not take these medicines before your procedure if your health care provider instructs you not to.  Plan to have someone take you home after the procedure.  If you wear dentures, be ready to remove them before the procedure. What happens during the procedure?  To reduce your risk of infection, your health care team will wash or sanitize their hands.  An IV tube will be put in a vein in your hand or arm. You will get medicines and fluids through this tube.  You will be given one or more of the following: ? A medicine to help you relax (  sedative). ? A medicine to numb the area (local anesthetic). This medicine may be sprayed into your throat. It will make you feel more comfortable and keep you from gagging or coughing during the procedure. ? A medicine for pain.  A mouth guard may be placed in your mouth to protect your teeth and to keep you from biting on the endoscope.  You will be asked to lie on your left side.  The endoscope will be lowered down your throat into  your esophagus, stomach, and duodenum.  Air will be put into the endoscope. This will help your health care provider see better.  The lining of your esophagus, stomach, and duodenum will be examined.  Your health care provider may: ? Take a tissue sample so it can be looked at in a lab (biopsy). ? Remove growths. ? Remove objects (foreign bodies) that are stuck. ? Treat any bleeding with medicines or other devices that stop tissue from bleeding. ? Widen (dilate) or stretch narrowed areas of your esophagus and stomach.  The endoscope will be taken out. The procedure may vary among health care providers and hospitals. What happens after the procedure?  Your blood pressure, heart rate, breathing rate, and blood oxygen level will be monitored often until the medicines you were given have worn off.  Do not eat or drink anything until the numbing medicine has worn off and your gag reflex has returned. This information is not intended to replace advice given to you by your health care provider. Make sure you discuss any questions you have with your health care provider. Document Released: 01/16/2005 Document Revised: 02/21/2016 Document Reviewed: 08/09/2015 Elsevier Interactive Patient Education  2018 [employer]. Esophagogastroduodenoscopy, Care After Refer to this sheet in the next few weeks. These instructions provide you with information about caring for yourself after your procedure. Your health care provider may also give you more specific instructions. Your treatment has been planned according to current medical practices, but problems sometimes occur. Call your health care provider if you have any problems or questions after your procedure. What can I expect after the procedure? After the procedure, it is common to have:  A sore throat.  Nausea.  Bloating.  Dizziness.  Fatigue.  Follow these instructions at home:  Do not eat or drink anything until the numbing medicine  (local anesthetic) has worn off and your gag reflex has returned. You will know that the local anesthetic has worn off when you can swallow comfortably.  Do not drive for 24 hours if you received a medicine to help you relax (sedative).  If your health care provider took a tissue sample for testing during the procedure, make sure to get your test results. This is your responsibility. Ask your health care provider or the department performing the test when your results will be ready.  Keep all follow-up visits as told by your health care provider. This is important. Contact a health care provider if:  You cannot stop coughing.  You are not urinating.  You are urinating less than usual. Get help right away if:  You have trouble swallowing.  You cannot eat or drink.  You have throat or chest pain that gets worse.  You are dizzy or light-headed.  You faint.  You have nausea or vomiting.  You have chills.  You have a fever.  You have severe abdominal pain.  You have black, tarry, or bloody stools. This information is not intended to replace advice given to you by  your health care provider. Make sure you discuss any questions you have with your health care provider. Document Released: 09/01/2012 Document Revised: 02/21/2016 Document Reviewed: 08/09/2015 Elsevier Interactive Patient Education  2018 [employer].  Esophageal Dilatation Esophageal dilatation is a procedure to open a blocked or narrowed part of the esophagus. The esophagus is the long tube in your throat that carries food and liquid from your mouth to your stomach. The procedure is also called esophageal dilation. You may need this procedure if you have a buildup of scar tissue in your esophagus that makes it difficult, painful, or even impossible to swallow. This can be caused by gastroesophageal reflux disease (GERD). In rare cases, people need this procedure because they have cancer of the esophagus or a problem  with the way food moves through the esophagus. Sometimes you may need to have another dilatation to enlarge the opening of the esophagus gradually. Tell a health care provider about:  Any allergies you have.  All medicines you are taking, including vitamins, herbs, eye drops, creams, and over-the-counter medicines.  Any problems you or family members have had with anesthetic medicines.  Any blood disorders you have.  Any surgeries you have had.  Any medical conditions you have.  Any antibiotic medicines you are required to take before dental procedures. What are the risks? Generally, this is a safe procedure. However, problems can occur and include:  Bleeding from a tear in the lining of the esophagus.  A hole (perforation) in the esophagus.  What happens before the procedure?  Do not eat or drink anything after midnight on the night before the procedure or as directed by your health care provider.  Ask your health care provider about changing or stopping your regular medicines. This is especially important if you are taking diabetes medicines or blood thinners.  Plan to have someone take you home after the procedure. What happens during the procedure?  You will be given a medicine that makes you relaxed and sleepy (sedative).  A medicine may be sprayed or gargled to numb the back of the throat.  Your health care provider can use various instruments to do an esophageal dilatation. During the procedure, the instrument used will be placed in your mouth and passed down into your esophagus. Options include: ? Simple dilators. This instrument is carefully placed in the esophagus to stretch it. ? Guided wire bougies. In this method, a flexible tube (endoscope) is used to insert a wire into the esophagus. The dilator is passed over this wire to enlarge the esophagus. Then the wire is removed. ? Balloon dilators. An endoscope with a small balloon at the end is passed down into the  esophagus. Inflating the balloon gently stretches the esophagus and opens it up. What happens after the procedure?  Your blood pressure, heart rate, breathing rate, and blood oxygen level will be monitored often until the medicines you were given have worn off.  Your throat may feel slightly sore and will probably still feel numb. This will improve slowly over time.  You will not be allowed to eat or drink until the throat numbness has resolved.  If this is a same-day procedure, you may be allowed to go home once you have been able to drink, urinate, and sit on the edge of the bed without nausea or dizziness.  If this is a same-day procedure, you should have a friend or family member with you for the next 24 hours after the procedure. This information is not  intended to replace advice given to you by your health care provider. Make sure you discuss any questions you have with your health care provider. Document Released: 11/06/2005 Document Revised: 02/21/2016 Document Reviewed: 01/25/2014 Elsevier Interactive Patient Education  2018 [place] Anesthesia is a term that refers to techniques, procedures, and medicines that help a person stay safe and comfortable during a medical procedure. Monitored anesthesia care, or sedation, is one type of anesthesia. Your anesthesia specialist may recommend sedation if you will be having a procedure that does not require you to be unconscious, such as:  Cataract surgery.  A dental procedure.  A biopsy.  A colonoscopy.  During the procedure, you may receive a medicine to help you relax (sedative). There are three levels of sedation:  Mild sedation. At this level, you may feel awake and relaxed. You will be able to follow directions.  Moderate sedation. At this level, you will be sleepy. You may not remember the procedure.  Deep sedation. At this level, you will be asleep. You will not remember the procedure.  The  more medicine you are given, the deeper your level of sedation will be. Depending on how you respond to the procedure, the anesthesia specialist may change your level of sedation or the type of anesthesia to fit your needs. An anesthesia specialist will monitor you closely during the procedure. Let your health care provider know about:  Any allergies you have.  All medicines you are taking, including vitamins, herbs, eye drops, creams, and over-the-counter medicines.  Any use of steroids (by mouth or as a cream).  Any problems you or family members have had with sedatives and anesthetic medicines.  Any blood disorders you have.  Any surgeries you have had.  Any medical conditions you have, such as sleep apnea.  Whether you are pregnant or may be pregnant.  Any use of cigarettes, alcohol, or street drugs. What are the risks? Generally, this is a safe procedure. However, problems may occur, including:  Getting too much medicine (oversedation).  Nausea.  Allergic reaction to medicines.  Trouble breathing. If this happens, a breathing tube may be used to help with breathing. It will be removed when you are awake and breathing on your own.  Heart trouble.  Lung trouble.  Before the procedure Staying hydrated Follow instructions from your health care provider about hydration, which may include:  Up to 2 hours before the procedure - you may continue to drink clear liquids, such as water, clear fruit juice, black coffee, and plain tea.  Eating and drinking restrictions Follow instructions from your health care provider about eating and drinking, which may include:  8 hours before the procedure - stop eating heavy meals or foods such as meat, fried foods, or fatty foods.  6 hours before the procedure - stop eating light meals or foods, such as toast or cereal.  6 hours before the procedure - stop drinking milk or drinks that contain milk.  2 hours before the procedure - stop  drinking clear liquids.  Medicines Ask your health care provider about:  Changing or stopping your regular medicines. This is especially important if you are taking diabetes medicines or blood thinners.  Taking medicines such as aspirin and ibuprofen. These medicines can thin your blood. Do not take these medicines before your procedure if your health care provider instructs you not to.  Tests and exams  You will have a physical exam.  You may have blood tests done  to show: ? How well your kidneys and liver are working. ? How well your blood can clot.  General instructions  Plan to have someone take you home from the hospital or clinic.  If you will be going home right after the procedure, plan to have someone with you for 24 hours.  What happens during the procedure?  Your blood pressure, heart rate, breathing, level of pain and overall condition will be monitored.  An IV tube will be inserted into one of your veins.  Your anesthesia specialist will give you medicines as needed to keep you comfortable during the procedure. This may mean changing the level of sedation.  The procedure will be performed. After the procedure  Your blood pressure, heart rate, breathing rate, and blood oxygen level will be monitored until the medicines you were given have worn off.  Do not drive for 24 hours if you received a sedative.  You may: ? Feel sleepy, clumsy, or nauseous. ? Feel forgetful about what happened after the procedure. ? Have a sore throat if you had a breathing tube during the procedure. ? Vomit. This information is not intended to replace advice given to you by your health care provider. Make sure you discuss any questions you have with your health care provider. Document Released: 06/11/2005 Document Revised: 02/22/2016 Document Reviewed: 01/06/2016 Elsevier Interactive Patient Education  2018 [place], Care After These instructions  provide you with information about caring for yourself after your procedure. Your health care provider may also give you more specific instructions. Your treatment has been planned according to current medical practices, but problems sometimes occur. Call your health care provider if you have any problems or questions after your procedure. What can I expect after the procedure? After your procedure, it is common to:  Feel sleepy for several hours.  Feel clumsy and have poor balance for several hours.  Feel forgetful about what happened after the procedure.  Have poor judgment for several hours.  Feel nauseous or vomit.  Have a sore throat if you had a breathing tube during the procedure.  Follow these instructions at home: For at least 24 hours after the procedure:   Do not: ? Participate in activities in which you could fall or become injured. ? Drive. ? Use heavy machinery. ? Drink alcohol. ? Take sleeping pills or medicines that cause drowsiness. ? Make important decisions or sign legal documents. ? Take care of children on your own.  Rest. Eating and drinking  Follow the diet that is recommended by your health care provider.  If you vomit, drink water, juice, or soup when you can drink without vomiting.  Make sure you have little or no nausea before eating solid foods. General instructions  Have a responsible adult stay with you until you are awake and alert.  Take over-the-counter and prescription medicines only as told by your health care provider.  If you smoke, do not smoke without supervision.  Keep all follow-up visits as told by your health care provider. This is important. Contact a health care provider if:  You keep feeling nauseous or you keep vomiting.  You feel light-headed.  You develop a rash.  You have a fever. Get help right away if:  You have trouble breathing. This information is not intended to replace advice given to you by your health  care provider. Make sure you discuss any questions you have with your health care provider. Document Released: 01/06/2016 Document Revised: 05/07/2016  Document Reviewed: 01/06/2016 Elsevier Interactive Patient Education  [employer].

## 2018-02-02 ENCOUNTER — Other Ambulatory Visit: Payer: Self-pay

## 2018-02-02 ENCOUNTER — Encounter (HOSPITAL_COMMUNITY)
Admission: RE | Admit: 2018-02-02 | Discharge: 2018-02-02 | Disposition: A | Discharge status: Home / Self Care | Payer: Medicare Other | Source: Ambulatory Visit | Attending: Internal Medicine | Admitting: Internal Medicine

## 2018-02-02 ENCOUNTER — Encounter (HOSPITAL_COMMUNITY): Payer: Self-pay

## 2018-02-02 DIAGNOSIS — I491 Atrial premature depolarization: Secondary | ICD-10-CM | POA: Insufficient documentation

## 2018-02-02 DIAGNOSIS — Z01810 Encounter for preprocedural cardiovascular examination: Secondary | ICD-10-CM | POA: Insufficient documentation

## 2018-02-02 HISTORY — DX: Unspecified convulsions: R56.9

## 2018-02-02 LAB — BASIC METABOLIC PANEL
ANION GAP: 7 (ref 5–15)
BUN: 9 mg/dL (ref 6–20)
CALCIUM: 9.4 mg/dL (ref 8.9–10.3)
CO2: 28 mmol/L (ref 22–32)
CREATININE: 0.81 mg/dL (ref 0.44–1.00)
Chloride: 103 mmol/L (ref 101–111)
Glucose, Bld: 112 mg/dL — ABNORMAL HIGH (ref 65–99)
Potassium: 3.7 mmol/L (ref 3.5–5.1)
SODIUM: 138 mmol/L (ref 135–145)

## 2018-02-02 LAB — CBC WITH DIFFERENTIAL/PLATELET
BASOS ABS: 0.1 10*3/uL (ref 0.0–0.1)
BASOS PCT: 1 %
EOS ABS: 0.2 10*3/uL (ref 0.0–0.7)
Eosinophils Relative: 2 %
HCT: 39.6 % (ref 36.0–46.0)
HEMOGLOBIN: 12.8 g/dL (ref 12.0–15.0)
Lymphocytes Relative: 23 %
Lymphs Abs: 2.4 10*3/uL (ref 0.7–4.0)
MCH: 30.5 pg (ref 26.0–34.0)
MCHC: 32.3 g/dL (ref 30.0–36.0)
MCV: 94.5 fL (ref 78.0–100.0)
MONO ABS: 0.7 10*3/uL (ref 0.1–1.0)
MONOS PCT: 7 %
NEUTROS ABS: 6.8 10*3/uL (ref 1.7–7.7)
NEUTROS PCT: 67 %
Platelets: 293 10*3/uL (ref 150–400)
RBC: 4.19 MIL/uL (ref 3.87–5.11)
RDW: 14.3 % (ref 11.5–15.5)
WBC: 10.2 10*3/uL (ref 4.0–10.5)

## 2018-02-08 ENCOUNTER — Ambulatory Visit (HOSPITAL_COMMUNITY)
Admission: RE | Admit: 2018-02-08 | Discharge: 2018-02-08 | Disposition: A | Discharge status: Home / Self Care | Payer: Medicare Other | Source: Ambulatory Visit | Attending: Internal Medicine | Admitting: Internal Medicine

## 2018-02-08 ENCOUNTER — Encounter (HOSPITAL_COMMUNITY)
Admission: RE | Disposition: A | Discharge status: Home / Self Care | Payer: Self-pay | Source: Ambulatory Visit | Attending: Internal Medicine

## 2018-02-08 ENCOUNTER — Ambulatory Visit (HOSPITAL_COMMUNITY): Payer: Medicare Other | Admitting: Anesthesiology

## 2018-02-08 ENCOUNTER — Encounter (HOSPITAL_COMMUNITY): Payer: Self-pay | Admitting: Emergency Medicine

## 2018-02-08 ENCOUNTER — Ambulatory Visit: Payer: Medicare Other | Admitting: Neurology

## 2018-02-08 ENCOUNTER — Other Ambulatory Visit: Payer: Self-pay

## 2018-02-08 DIAGNOSIS — K219 Gastro-esophageal reflux disease without esophagitis: Secondary | ICD-10-CM | POA: Insufficient documentation

## 2018-02-08 DIAGNOSIS — E119 Type 2 diabetes mellitus without complications: Secondary | ICD-10-CM | POA: Diagnosis not present

## 2018-02-08 DIAGNOSIS — G4730 Sleep apnea, unspecified: Secondary | ICD-10-CM | POA: Insufficient documentation

## 2018-02-08 DIAGNOSIS — F329 Major depressive disorder, single episode, unspecified: Secondary | ICD-10-CM | POA: Diagnosis not present

## 2018-02-08 DIAGNOSIS — Z8249 Family history of ischemic heart disease and other diseases of the circulatory system: Secondary | ICD-10-CM | POA: Diagnosis not present

## 2018-02-08 DIAGNOSIS — Z8719 Personal history of other diseases of the digestive system: Secondary | ICD-10-CM | POA: Insufficient documentation

## 2018-02-08 DIAGNOSIS — Z880 Allergy status to penicillin: Secondary | ICD-10-CM | POA: Insufficient documentation

## 2018-02-08 DIAGNOSIS — K5190 Ulcerative colitis, unspecified, without complications: Secondary | ICD-10-CM | POA: Diagnosis not present

## 2018-02-08 DIAGNOSIS — Z86711 Personal history of pulmonary embolism: Secondary | ICD-10-CM | POA: Diagnosis not present

## 2018-02-08 DIAGNOSIS — Z888 Allergy status to other drugs, medicaments and biological substances status: Secondary | ICD-10-CM | POA: Insufficient documentation

## 2018-02-08 DIAGNOSIS — Z8541 Personal history of malignant neoplasm of cervix uteri: Secondary | ICD-10-CM | POA: Diagnosis not present

## 2018-02-08 DIAGNOSIS — Z7982 Long term (current) use of aspirin: Secondary | ICD-10-CM | POA: Insufficient documentation

## 2018-02-08 DIAGNOSIS — Z9989 Dependence on other enabling machines and devices: Secondary | ICD-10-CM | POA: Diagnosis not present

## 2018-02-08 DIAGNOSIS — R1319 Other dysphagia: Secondary | ICD-10-CM

## 2018-02-08 DIAGNOSIS — M797 Fibromyalgia: Secondary | ICD-10-CM | POA: Insufficient documentation

## 2018-02-08 DIAGNOSIS — Z96651 Presence of right artificial knee joint: Secondary | ICD-10-CM | POA: Insufficient documentation

## 2018-02-08 DIAGNOSIS — Z881 Allergy status to other antibiotic agents status: Secondary | ICD-10-CM | POA: Diagnosis not present

## 2018-02-08 DIAGNOSIS — K449 Diaphragmatic hernia without obstruction or gangrene: Secondary | ICD-10-CM | POA: Insufficient documentation

## 2018-02-08 DIAGNOSIS — Z885 Allergy status to narcotic agent status: Secondary | ICD-10-CM | POA: Diagnosis not present

## 2018-02-08 DIAGNOSIS — Z8711 Personal history of peptic ulcer disease: Secondary | ICD-10-CM | POA: Insufficient documentation

## 2018-02-08 DIAGNOSIS — M1990 Unspecified osteoarthritis, unspecified site: Secondary | ICD-10-CM | POA: Insufficient documentation

## 2018-02-08 DIAGNOSIS — R1310 Dysphagia, unspecified: Secondary | ICD-10-CM

## 2018-02-08 DIAGNOSIS — I493 Ventricular premature depolarization: Secondary | ICD-10-CM | POA: Insufficient documentation

## 2018-02-08 DIAGNOSIS — Z87891 Personal history of nicotine dependence: Secondary | ICD-10-CM | POA: Insufficient documentation

## 2018-02-08 DIAGNOSIS — J449 Chronic obstructive pulmonary disease, unspecified: Secondary | ICD-10-CM | POA: Insufficient documentation

## 2018-02-08 DIAGNOSIS — K222 Esophageal obstruction: Secondary | ICD-10-CM | POA: Insufficient documentation

## 2018-02-08 DIAGNOSIS — Z886 Allergy status to analgesic agent status: Secondary | ICD-10-CM | POA: Diagnosis not present

## 2018-02-08 DIAGNOSIS — Z79899 Other long term (current) drug therapy: Secondary | ICD-10-CM | POA: Insufficient documentation

## 2018-02-08 HISTORY — PX: ESOPHAGOGASTRODUODENOSCOPY (EGD) WITH PROPOFOL: SHX5813

## 2018-02-08 HISTORY — PX: MALONEY DILATION: SHX5535

## 2018-02-08 LAB — GLUCOSE, CAPILLARY
GLUCOSE-CAPILLARY: 114 mg/dL — AB (ref 65–99)
Glucose-Capillary: 149 mg/dL — ABNORMAL HIGH (ref 65–99)

## 2018-02-08 SURGERY — ESOPHAGOGASTRODUODENOSCOPY (EGD) WITH PROPOFOL
Anesthesia: Monitor Anesthesia Care

## 2018-02-08 MED ORDER — PROPOFOL 10 MG/ML IV BOLUS
INTRAVENOUS | Status: AC
  Filled 2018-02-08: qty 40

## 2018-02-08 MED ORDER — LACTATED RINGERS IV SOLN
INTRAVENOUS | Status: DC
  Administered 2018-02-08: 09:00:00 via INTRAVENOUS

## 2018-02-08 MED ORDER — LIDOCAINE VISCOUS 2 % MT SOLN: 5.0000 mL | Freq: Two times a day (BID) | OROMUCOSAL | Status: DC

## 2018-02-08 MED ORDER — CHLORHEXIDINE GLUCONATE CLOTH 2 % EX PADS: 6.0000 | MEDICATED_PAD | Freq: Once | CUTANEOUS | Status: DC

## 2018-02-08 MED ORDER — PROPOFOL 500 MG/50ML IV EMUL
INTRAVENOUS | Status: DC | PRN
  Administered 2018-02-08: 150 ug/kg/min via INTRAVENOUS

## 2018-02-08 NOTE — H&P (Signed)
@LOGO @   Primary Care Physician:  Jalene Mullet, PA-C Primary Gastroenterologist:  Dr. Gala Romney  Pre-Procedure History & Physical: HPI:  [name] is a 67 y.o. female here for recurrent esophageal dysphagia. Patient somewhat of a recalcitrant Schatzki's ring. Last dilation (sees 6 [place] and biopsy disruption) February of last year did very well until recently. She states it combination of Nexium 40 and Pepcid Complete controls reflux symptoms very well-having a flare only about once a month.  Past Medical History:  Diagnosis Date  . Anemia   . Anginal pain ([place])    PATIENT STATES DOCT THINKS IS FIBROMYALGIA  . Arthritis   . Asthma   . Blood clot in lung last march  . 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 Chronic back pain   . COPD (chronic obstructive pulmonary disease) ([place])   . DDD (degenerative disc disease)   . Degenerative tear of posterior horn of lateral meniscus of right knee 01/27/2014  . Depression   . Diabetes mellitus without complication ([place])   . Diverticulitis   . Fibromyalgia   . GERD (gastroesophageal reflux disease)   . Headache    HX MIGRAINES    . Hiatal hernia   . HOH (hard of hearing)    right  . Pancreatitis   . Pneumonia    1 YR  . PONV (postoperative nausea and vomiting)    only happened after knee arthroscopy  . Primary localized osteoarthritis of right knee 12/11/2015  . PVC's (premature ventricular contractions)   . Schatzki's ring   . Seizures ([place])    had febrile seizures as child; none since childhood and on no meds  . Shortness of breath dyspnea    WITH EXERTION   . Sleep apnea    CPAP at night  . Ulcerative colitis 2007  . Wears dentures    full top-partial bottom    Past Surgical History:  Procedure Laterality Date  . arch and foot    . CERVICAL CONE BIOPSY    . CHOLECYSTECTOMY    . COLONOSCOPY  12/03/05   Rourk-marked inflammatory changes of the rectum, left colon, pan colitis, internal hemorrhoids, sigmoid diverticula   . COLONOSCOPY N/A 10/20/2013   Dr. Rourk:Colonic diverticulosis. colonic mucosal ulcerations involving segment of sigmoid colon, atypical for UC. sigmoid colon path with chronic mildly active colitis consistent with IBD, other biopsies including ascending, transverse, descending colon and rectum unremarkable.   . COLONOSCOPY N/A 11/19/2016   Dr. Gala Romney: Diverticulosis, 7 millimeter polyp removed from the descending colon, internal hemorrhoids, segmental biopsies unremarkable.  Marland Kitchen DILATION AND CURETTAGE OF UTERUS    . ESOPHAGEAL DILATION N/A 09/20/2015   Procedure: ESOPHAGEAL DILATION;  Surgeon: Daneil Dolin, MD;  [facility];  Service: Endoscopy;  Laterality: N/A;  . ESOPHAGOGASTRODUODENOSCOPY  01/28/2011   Rourk- Schatzki ring, s/p 75F, otherwise normal/Hiatal hernia otherwise normal stomach D1 and D2  . ESOPHAGOGASTRODUODENOSCOPY  11/30/08   Rourk-Schatzki's ring status post dilation, hiatal hernia  . ESOPHAGOGASTRODUODENOSCOPY  07/12/2007   Dilation with 56 French, Schatzki's ring  . ESOPHAGOGASTRODUODENOSCOPY N/A 08/10/2014   Dr.Rourk- prominent schatzki's ring s/p maloney dilation and disruption, moderate sized hiatal hernia  . ESOPHAGOGASTRODUODENOSCOPY N/A 09/20/2015   RMR: Schatzki's ring status post dilation as described above. Rather large hiatal hernia; somewhat baggy atonic esophageal body' otherwise normal EGD  . ESOPHAGOGASTRODUODENOSCOPY N/A 11/19/2016   Dr. Gala Romney: moderate Schatzki ring s/p dirsuption with biopsy forceps, large hiatal hernia  . ESOPHAGOGASTRODUODENOSCOPY (EGD)  WITH ESOPHAGEAL DILATION  09/01/2012   Dr. Gala Romney- schatzki's ring, hiatal hernia  . FOOT ARTHRODESIS  [id number]   both feet  . HERNIA REPAIR     umbilical  . JOINT REPLACEMENT Right   . KNEE ARTHROSCOPY WITH LATERAL MENISECTOMY Right 01/27/2014   Procedure: RIGHT KNEE ARTHROSCOPY WITH PARTIAL LATERAL MENISECTOMY, ;  Surgeon: Johnny Bridge, MD;  Location: [facility];  Service:  Orthopedics;  Laterality: Right;  . MALONEY DILATION N/A 08/10/2014   Procedure: [name] DILATION;  Surgeon: Daneil Dolin, MD;  [facility];  Service: Endoscopy;  Laterality: N/A;  [name] DILATION N/A 11/19/2016   Procedure: [name] DILATION;  Surgeon: Daneil Dolin, MD;  [facility];  Service: Endoscopy;  Laterality: N/A;  . SAVORY DILATION N/A 08/10/2014   Procedure: SAVORY DILATION;  Surgeon: Daneil Dolin, MD;  [facility];  Service: Endoscopy;  Laterality: N/A;  . SHOULDER OPEN ROTATOR CUFF REPAIR     BILAT  . TONSILLECTOMY    . TOTAL KNEE ARTHROPLASTY Right 12/11/2015   Procedure: TOTAL KNEE ARTHROPLASTY;  Surgeon: Marchia Bond, MD;  Location: [facility];  Service: Orthopedics;  Laterality: Right;    Prior to Admission medications   Medication Sig Start Date End Date Taking? Authorizing Provider  acetaminophen (TYLENOL) 500 MG tablet Take 1,000 mg by mouth every 6 (six) hours as needed for moderate pain or headache.   Yes [provider]  ADVAIR DISKUS 250-50 MCG/DOSE AEPB Inhale 1 puff into the lungs 2 (two) times daily.  08/21/15  Yes [provider]  albuterol (PROVENTIL) (2.5 MG/3ML) 0.083% nebulizer solution Take 2.5 mg by nebulization every 6 (six) hours as needed for wheezing or shortness of breath.   Yes [provider]  albuterol (PROVENTIL,VENTOLIN) 90 MCG/ACT inhaler Inhale 2 puffs into the lungs every 6 (six) hours as needed for wheezing or shortness of breath.    Yes [provider]  aspirin EC 81 MG tablet Take 81 mg by mouth daily.   Yes [provider]  azelastine (ASTELIN) 137 MCG/SPRAY nasal spray 2 sprays by Nasal route 2 (two) times daily. Use in each nostril as directed    Yes [provider]  balsalazide (COLAZAL) 750 MG capsule TAKE 3 CAPSULES TWICE A DAY 07/13/17  Yes Annitta Needs, NP  buPROPion (WELLBUTRIN XL) 150 MG 24 hr tablet Take 150 mg by mouth daily. 10/09/15  Yes  [provider]  busPIRone (BUSPAR) 5 MG tablet Take 7.5 mg by mouth 2 (two) times daily.   Yes [provider]  Calcium Carb-Cholecalciferol (CALCIUM-VITAMIN D) 600-400 MG-UNIT TABS Take 1 tablet by mouth 2 (two) times daily.   Yes [provider]  cetirizine (ZYRTEC) 10 MG tablet Take 10 mg by mouth daily.   Yes [provider]  dicyclomine (BENTYL) 10 MG capsule TAKE 1 CAPSULE FOUR TIMES A DAY BEFORE MEALS AND AT BEDTIME 06/28/17  Yes Mahala Menghini, PA-C  DULoxetine (CYMBALTA) 30 MG capsule Take 90 mg by mouth 2 (two) times daily.    Yes [provider]  esomeprazole (NEXIUM) 40 MG capsule TAKE 1 CAPSULE EVERY MORNING 06/28/17  Yes Mahala Menghini, PA-C  famotidine-calcium carbonate-magnesium hydroxide (PEPCID COMPLETE) 10-800-165 MG CHEW chewable tablet Chew 1 tablet by mouth daily as needed (for breakthrough GERD).    Yes [provider]  ferrous sulfate 325 (65 FE) MG tablet Take 325 mg by mouth daily with breakfast.   Yes  [provider]  fluticasone (VERAMYST) 27.5 MCG/SPRAY nasal spray Place 2 sprays into the nose 2 (two) times daily as needed for rhinitis.   Yes [provider]  ipratropium (ATROVENT) 0.03 % nasal spray Place 2 sprays into both nostrils every 12 (twelve) hours.   Yes [provider]  ketotifen (ALAWAY) 0.025 % ophthalmic solution Place 1 drop into both eyes daily.    Yes [provider]  lidocaine (LIDODERM) 5 % Place 1-2 patches onto the skin daily as needed (pain). Remove & Discard patch within 12 hours or as directed by MD   Yes [provider]  methylcellulose (CITRUCEL) oral powder Take 1 packet by mouth daily as needed (constipation).    Yes [provider]  Polyvinyl Alcohol (LUBRICANT DROPS OP) Apply 2 drops to eye 2 (two) times daily.   Yes [provider]  pravastatin (PRAVACHOL) 20 MG tablet TAKE 1 TABLET EVERY EVENING (NEED APPOINTMENT FOR FURTHER  REFILLS) 08/14/17  Yes Branch, Alphonse Guild, MD  pregabalin (LYRICA) 100 MG capsule Take 100 mg by mouth 2 (two) times daily.   Yes [provider]  Probiotic CAPS Take 1 capsule by mouth daily.   Yes [provider]  tiZANidine (ZANAFLEX) 4 MG tablet Take 4 mg by mouth 3 (three) times daily as needed for muscle spasms.  10/27/16  Yes [provider]  traMADol (ULTRAM-ER) 100 MG 24 hr tablet Take 100 mg by mouth at bedtime. 1 hour before sleep   Yes [provider]  traZODone (DESYREL) 100 MG tablet Take 100 mg by mouth at bedtime.     Yes [provider]    Allergies as of 01/06/2018 - Review Complete 01/06/2018  Allergen Reaction Noted  . Mesalamine Other (See Comments)   . Cefprozil Other (See Comments)   . Cefuroxime axetil Other (See Comments) 08/08/2008  . Morphine and related Hives and Itching 08/23/2012  . Penicillins Hives 08/08/2008  . Aspirin Other (See Comments)   . Chicken allergy Nausea And Vomiting and Rash 11/27/2015  . Clarithromycin Nausea And Vomiting 08/08/2008  . Codeine Other (See Comments)   . Entex Other (See Comments) 08/08/2008    Family History  Problem Relation Age of Onset  . Heart disease Mother   . Atrial fibrillation Sister   . Arrhythmia Brother   . Colon cancer Neg Hx     Social History   Socioeconomic History  . Marital status: Married    Spouse name: Not on file  . Number of children: 3  . Years of education: Not on file  . Highest education level: Not on file  Occupational History  . 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 Financial resource strain: Not on file  . Food insecurity:    Worry: Not on file    Inability: Not on file  . Transportation needs:    Medical: Not on file    Non-medical: Not on file  Tobacco Use  . Smoking status: Former Smoker    Packs/day: 1.00    Years: 30.00    Pack years: 30.00    Types: Cigarettes    Start date: 07/07/1969    Last attempt to  quit: 08/30/2011    Years since quitting: 6.4  . Smokeless tobacco: Never Used  Substance and Sexual Activity  . Alcohol use: No    Alcohol/week: 0.0 oz  . Drug use: No  . Sexual activity: Yes    Partners: Male    Birth  control/protection: None    Comment: spouse  Lifestyle  . Physical activity:    Days per week: Not on file    Minutes per session: Not on file  . Stress: Not on file  Relationships  . Social connections:    Talks on phone: Not on file    Gets together: Not on file    Attends religious service: Not on file    Active member of club or organization: Not on file    Attends meetings of clubs or organizations: Not on file    Relationship status: Not on file  . Intimate partner violence:    Fear of current or ex partner: Not on file    Emotionally abused: Not on file    Physically abused: Not on file    Forced sexual activity: Not on file  Other Topics Concern  . Not on file  Social History Narrative  . Not on file    Review of Systems: See HPI, otherwise negative ROS  Physical Exam: BP 107/66   Pulse 70   Temp 99 F (37.2 C) (Oral)   Resp 18   SpO2 97%  General:   Alert,  Well-developed, well-nourished, pleasant and cooperative in NAD Mouth:  No deformity or lesions. Neck:  Supple; no masses or thyromegaly. No significant cervical adenopathy. Lungs:  Clear throughout to auscultation.   No wheezes, crackles, or rhonchi. No acute distress. Heart:  Regular rate and rhythm; no murmurs, clicks, rubs,  or gallops. Abdomen: Non-distended, normal bowel sounds.  Soft and nontender without appreciable mass or hepatosplenomegaly.  Pulses:  Normal pulses noted. Extremities:  Without clubbing or edema.  Impression:  67 year old lady with recurrent esophageal dysphagia secondary to a Schatzki's ring  She has benefited from dilation previously  Recommendations:  I have offered the patient EGD with possible esophageal dilation as feasible/appropriate diaper plan. The  risks, benefits, limitations, alternatives and imponderables have been reviewed with the patient. Potential for esophageal dilation, biopsy, etc. have also been reviewed.  Questions have been answered. All parties agreeable.     Notice: This dictation was prepared with Dragon dictation along with smaller phrase technology. Any transcriptional errors that result from this process are unintentional and may not be corrected upon review.

## 2018-02-08 NOTE — Op Note (Signed)
[facility] Patient Name: [name] Procedure Date: 02/08/2018 9:06 AM MRN: [id number] Date of Birth: 1951/02/13 Attending MD: Norvel Richards , MD CSN: [id number] Age: 67 Admit Type: Outpatient Procedure:                Upper GI endoscopy Indications:              Dysphagia Providers:                Norvel Richards, MD, Lurline Del, RN, Aram Candela Referring MD:             Manon Hilding MD, MD Medicines:                Propofol per Anesthesia Complications:            No immediate complications. Estimated Blood Loss:     Estimated blood loss was minimal. Procedure:                Pre-Anesthesia Assessment:                           - Prior to the procedure, a History and Physical                            was performed, and patient medications and                            allergies were reviewed. The patient's tolerance of                            previous anesthesia was also reviewed. The risks                            and benefits of the procedure and the sedation                            options and risks were discussed with the patient.                            All questions were answered, and informed consent                            was obtained. Prior Anticoagulants: The patient has                            taken no previous anticoagulant or antiplatelet                            agents. ASA Grade Assessment: III - A patient with                            severe systemic disease. After reviewing the risks  and benefits, the patient was deemed in                            satisfactory condition to undergo the procedure.                           After obtaining informed consent, the endoscope was                            passed under direct vision. Throughout the                            procedure, the patient's blood pressure, pulse, and                            oxygen  saturations were monitored continuously. The                            EG29-[id number] ([id number]) scope was introduced through the                            and advanced to the second part of duodenum. Scope In: 9:40:27 AM Scope Out: 9:54:57 AM Total Procedure Duration: 0 hours 14 minutes 30 seconds  Findings:      A obstructing Schatzki ring was found at the gastroesophageal junction.       The scope was withdrawn. Dilation was performed with a Maloney dilator       with no resistance at 56 Fr. Dilation was performed with a Maloney       dilator with mild resistance at 33 Fr. The dilation site was examined       following endoscope reinsertion and showed mild mucosal disruption.       Estimated blood loss was minimal. The scope was withdrawn. Dilation was       performed with a Maloney dilator with moderate resistance at 60 Fr. The       dilation site was examined following endoscope reinsertion and showed       moderate mucosal disruption. Estimated blood loss was minimal. finally,       3 quadrant bites were taken with the jumbo bpsy forceps. This was done       effectively without complication.      A medium-sized hiatal hernia was present.      The duodenal bulb and second portion of the duodenum were normal. Impression:               - Obstructing Schatzki ring. Dilated / Disrupted                           - Medium-sized hiatal hernia.                           - Normal duodenal bulb and second portion of the                            duodenum.                           -  No specimens collected. Moderate Sedation:      Moderate (conscious) sedation was personally administered by an       anesthesia professional. The following parameters were monitored: oxygen       saturation, heart rate, blood pressure, respiratory rate, EKG, adequacy       of pulmonary ventilation, and response to care. Total physician       intraservice time was 21 minutes. Recommendation:           - Patient has  a contact number available for                            emergencies. The signs and symptoms of potential                            delayed complications were discussed with the                            patient. Return to normal activities tomorrow.                            Written discharge instructions were provided to the                            patient.                           - Continue present medications. Increase Nexium to                            40 mg twice daily. Office visit with [place] in 6 months                           - No repeat upper endoscopy.                           - Return to GI office in 6 months. Procedure Code(s):        --- Professional ---                           [phone], Esophagogastroduodenoscopy, flexible,                            transoral; diagnostic, including collection of                            specimen(s) by brushing or washing, when performed                            (separate procedure)                           43450, Dilation of esophagus, by unguided sound or                            bougie, single or multiple passes Diagnosis Code(s):        ---  Professional ---                           K22.2, Esophageal obstruction                           K44.9, Diaphragmatic hernia without obstruction or                            gangrene                           R13.10, Dysphagia, unspecified CPT copyright 2017 American Medical Association. All rights reserved. The codes documented in this report are preliminary and upon coder review may  be revised to meet current compliance requirements. [name]. [name], MD Norvel Richards, MD 02/08/2018 10:05:39 AM This report has been signed electronically. Number of Addenda: 0

## 2018-02-08 NOTE — Transfer of Care (Signed)
Immediate Anesthesia Transfer of Care Note  Patient: [name]  Procedure(s) Performed: ESOPHAGOGASTRODUODENOSCOPY (EGD) WITH PROPOFOL (N/A ) MALONEY DILATION (N/A )  Patient Location: PACU  Anesthesia Type:MAC  Level of Consciousness: awake, alert  and oriented  Airway & Oxygen Therapy: Patient Spontanous Breathing  Post-op Assessment: Report given to RN  Post vital signs: Reviewed and stable  Last Vitals:  Vitals Value Taken Time  BP    Temp    Pulse    Resp    SpO2      Last Pain:  Vitals:   02/08/18 0842  TempSrc: Oral  PainSc: 0-No pain         Complications: No apparent anesthesia complications

## 2018-02-08 NOTE — Anesthesia Postprocedure Evaluation (Signed)
Anesthesia Post Note  Patient: [name]  Procedure(s) Performed: ESOPHAGOGASTRODUODENOSCOPY (EGD) WITH PROPOFOL (N/A ) MALONEY DILATION (N/A )  Patient location during evaluation: PACU Anesthesia Type: MAC Level of consciousness: awake and alert Pain management: pain level controlled Vital Signs Assessment: post-procedure vital signs reviewed and stable Respiratory status: spontaneous breathing Cardiovascular status: blood pressure returned to baseline and stable Postop Assessment: no apparent nausea or vomiting Anesthetic complications: no     Last Vitals:  Vitals:   02/08/18 0842  BP: 107/66  Pulse: 70  Resp: 18  Temp: 37.2 C  SpO2: 97%    Last Pain:  Vitals:   02/08/18 0842  TempSrc: Oral  PainSc: 0-No pain                 Shailene Demonbreun

## 2018-02-08 NOTE — Addendum Note (Signed)
Addendum  created 02/08/18 1613 by Ollen Bowl, CRNA   Intraprocedure Flowsheets edited, Intraprocedure Staff edited

## 2018-02-08 NOTE — Discharge Instructions (Signed)
EGD Discharge instructions Please read the instructions outlined below and refer to this sheet in the next few weeks. These discharge instructions provide you with general information on caring for yourself after you leave the hospital. Your doctor may also give you specific instructions. While your treatment has been planned according to the most current medical practices available, unavoidable complications occasionally occur. If you have any problems or questions after discharge, please call your doctor. ACTIVITY  You may resume your regular activity but move at a slower pace for the next 24 hours.   Take frequent rest periods for the next 24 hours.   Walking will help expel (get rid of) the air and reduce the bloated feeling in your abdomen.   No driving for 24 hours (because of the anesthesia (medicine) used during the test).   You may shower.   Do not sign any important legal documents or operate any machinery for 24 hours (because of the anesthesia used during the test).  NUTRITION  Drink plenty of fluids.   You may resume your normal diet.   Begin with a light meal and progress to your normal diet.   Avoid alcoholic beverages for 24 hours or as instructed by your caregiver.  MEDICATIONS  You may resume your normal medications unless your caregiver tells you otherwise.  WHAT YOU CAN EXPECT TODAY  You may experience abdominal discomfort such as a feeling of fullness or gas pains.  FOLLOW-UP  Your doctor will discuss the results of your test with you.  SEEK IMMEDIATE MEDICAL ATTENTION IF ANY OF THE FOLLOWING OCCUR:  Excessive nausea (feeling sick to your stomach) and/or vomiting.   Severe abdominal pain and distention (swelling).   Trouble swallowing.   Temperature over 101 F (37.8 C).   Rectal bleeding or vomiting of blood.    Current information provided  Increase Nexium to 40 mg twice daily for the next 6 months  Office visit with [place] in 6  months    PATIENT INSTRUCTIONS POST-ANESTHESIA  IMMEDIATELY FOLLOWING SURGERY:  Do not drive or operate machinery for the first twenty four hours after surgery.  Do not make any important decisions for twenty four hours after surgery or while taking narcotic pain medications or sedatives.  If you develop intractable nausea and vomiting or a severe headache please notify your doctor immediately.  FOLLOW-UP:  Please make an appointment with your surgeon as instructed. You do not need to follow up with anesthesia unless specifically instructed to do so.  WOUND CARE INSTRUCTIONS (if applicable):  Keep a dry clean dressing on the anesthesia/puncture wound site if there is drainage.  Once the wound has quit draining you may leave it open to air.  Generally you should leave the bandage intact for twenty four hours unless there is drainage.  If the epidural site drains for more than 36-48 hours please call the anesthesia department.  QUESTIONS?:  Please feel free to call your physician or the hospital operator if you have any questions, and they will be happy to assist you.

## 2018-02-08 NOTE — Anesthesia Preprocedure Evaluation (Signed)
Anesthesia Evaluation  Patient identified by MRN, date of birth, ID band Patient awake    Reviewed: Allergy & Precautions, H&P , NPO status , Patient's Chart, lab work & pertinent test results  History of Anesthesia Complications (+) PONV and history of anesthetic complications  Airway Mallampati: II  TM Distance: >3 FB Neck ROM: full    Dental no notable dental hx. (+) Edentulous Upper, Edentulous Lower   Pulmonary neg pulmonary ROS, shortness of breath, asthma , sleep apnea , pneumonia, COPD, former smoker,    Pulmonary exam normal breath sounds clear to auscultation       Cardiovascular Exercise Tolerance: Good + angina negative cardio ROS   Rhythm:regular Rate:Normal     Neuro/Psych  Headaches, Seizures -,  PSYCHIATRIC DISORDERS Depression  Neuromuscular disease negative neurological ROS  negative psych ROS   GI/Hepatic negative GI ROS, Neg liver ROS, hiatal hernia, PUD, GERD  ,  Endo/Other  negative endocrine ROSdiabetes  Renal/GU negative Renal ROS  negative genitourinary   Musculoskeletal   Abdominal   Peds  Hematology negative hematology ROS (+) anemia ,   Anesthesia Other Findings   Reproductive/Obstetrics negative OB ROS                             Anesthesia Physical Anesthesia Plan  ASA: III  Anesthesia Plan: MAC   Post-op Pain Management:    Induction:   PONV Risk Score and Plan:   Airway Management Planned:   Additional Equipment:   Intra-op Plan:   Post-operative Plan:   Informed Consent: I have reviewed the patients History and Physical, chart, labs and discussed the procedure including the risks, benefits and alternatives for the proposed anesthesia with the patient or authorized representative who has indicated his/her understanding and acceptance.   Dental Advisory Given  Plan Discussed with: CRNA  Anesthesia Plan Comments:         Anesthesia  Quick Evaluation

## 2018-02-11 ENCOUNTER — Encounter (HOSPITAL_COMMUNITY): Payer: Self-pay | Admitting: Internal Medicine

## 2018-02-17 ENCOUNTER — Encounter: Payer: Self-pay | Admitting: Neurology

## 2018-02-17 ENCOUNTER — Ambulatory Visit (INDEPENDENT_AMBULATORY_CARE_PROVIDER_SITE_OTHER): Payer: Medicare Other | Admitting: Neurology

## 2018-02-17 ENCOUNTER — Telehealth: Payer: Self-pay | Admitting: Neurology

## 2018-02-17 VITALS — BP 138/81 | HR 88 | Ht 64.0 in | Wt 181.0 lb

## 2018-02-17 DIAGNOSIS — G250 Essential tremor: Secondary | ICD-10-CM

## 2018-02-17 NOTE — Progress Notes (Signed)
Subjective:    Patient ID: [name] is a 67 y.o. female.  HPI     Star Age, MD, PhD [facility] [address], [address] [place], [place] [address]  Dear [name],   I saw your patient, [name], upon your kind request in my neurologic clinic today for initial consultation of her hand tremors. The patient is unaccompanied today. As you know, [name] is a 67 year old right-handed woman with an underlying medical history of allergies, asthma, diabetes, diverticulosis, hearing loss, reflux disease, hyperlipidemia, obesity, irritable bowel syndrome, osteoarthritis, history of pancreatitis, ulcerative colitis, hx of PE (finished Xarelto Rx after about 9 months), status post multiple surgeries including tonsillectomy, shoulder surgery, knee surgery, foot surgeries, multiple esophageal dilatations, R knee arthroscopic, then replacement surgery, left ankle surgery in 9/18, hernia repair, cholecystectomy, who reports bilateral hand tremors for the past several years, since around 1984. She has noted worsening with time, got better some in the remote past. She has a FHx of tremor in her older brother, she has one more brother and 2 sisters. Her mom had an occasional tremor and MGF had tremors. Her middle son has tremors, she has 3 sons. She reports that her middle son has anxiety disorder and all 3 sons had ADHD. I reviewed your office note from 12/23/2017, which you kindly included. She is married and lives with her husband. 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 She quit smoking in 2012, does not consume alcohol currently (lost the taste for it, no Hx of dependence or abuse), drinks caffeine in the form of tea, about 16 ounces per day. Drinks about 4 bottles of water per day. Of note, she is on multiple medications including several psychotropic and neurotropic medications: Trazodone 100 mg at bedtime, tramadol long-acting 100 mg  at bedtime, Zanaflex 4 mg strength up to 3 times a day when necessary, pregabalin 100 mg twice daily, Cymbalta 90 mg once a day, BuSpar 7.5 mg twice daily, Wellbutrin long-acting 150 mg daily.   Her Past Medical History Is Significant For: Past Medical History:  Diagnosis Date  . Anemia   . Anginal pain ([place])    PATIENT STATES DOCT THINKS IS FIBROMYALGIA  . Arthritis   . Asthma   . Blood clot in lung last march  . 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 Chronic back pain   . COPD (chronic obstructive pulmonary disease) ([place])   . DDD (degenerative disc disease)   . Degenerative tear of posterior horn of lateral meniscus of right knee 01/27/2014  . Depression   . Diabetes mellitus without complication ([place])   . Diverticulitis   . Fibromyalgia   . GERD (gastroesophageal reflux disease)   . Headache    HX MIGRAINES    . Hiatal hernia   . HOH (hard of hearing)    right  . Pancreatitis   . Pneumonia    1 YR  . PONV (postoperative nausea and vomiting)    only happened after knee arthroscopy  . Primary localized osteoarthritis of right knee 12/11/2015  . PVC's (premature ventricular contractions)   . Schatzki's ring   . Seizures ([place])    had febrile seizures as child; none since childhood and on no meds  . Shortness of breath dyspnea    WITH EXERTION   . Sleep apnea    CPAP at night  . Ulcerative colitis 2007  . Wears dentures    full  top-partial bottom    Her Past Surgical History Is Significant For: Past Surgical History:  Procedure Laterality Date  . arch and foot    . CERVICAL CONE BIOPSY    . CHOLECYSTECTOMY    . COLONOSCOPY  12/03/05   Rourk-marked inflammatory changes of the rectum, left colon, pan colitis, internal hemorrhoids, sigmoid diverticula  . COLONOSCOPY N/A 10/20/2013   Dr. Rourk:Colonic diverticulosis. colonic mucosal ulcerations involving segment of sigmoid colon, atypical for UC. sigmoid colon path with chronic mildly active colitis consistent with IBD, other  biopsies including ascending, transverse, descending colon and rectum unremarkable.   . COLONOSCOPY N/A 11/19/2016   Dr. Gala Romney: Diverticulosis, 7 millimeter polyp removed from the descending colon, internal hemorrhoids, segmental biopsies unremarkable.  Marland Kitchen DILATION AND CURETTAGE OF UTERUS    . ESOPHAGEAL DILATION N/A 09/20/2015   Procedure: ESOPHAGEAL DILATION;  Surgeon: Daneil Dolin, MD;  [facility];  Service: Endoscopy;  Laterality: N/A;  . ESOPHAGOGASTRODUODENOSCOPY  01/28/2011   Rourk- Schatzki ring, s/p 66F, otherwise normal/Hiatal hernia otherwise normal stomach D1 and D2  . ESOPHAGOGASTRODUODENOSCOPY  11/30/08   Rourk-Schatzki's ring status post dilation, hiatal hernia  . ESOPHAGOGASTRODUODENOSCOPY  07/12/2007   Dilation with 56 French, Schatzki's ring  . ESOPHAGOGASTRODUODENOSCOPY N/A 08/10/2014   Dr.Rourk- prominent schatzki's ring s/p maloney dilation and disruption, moderate sized hiatal hernia  . ESOPHAGOGASTRODUODENOSCOPY N/A 09/20/2015   RMR: Schatzki's ring status post dilation as described above. Rather large hiatal hernia; somewhat baggy atonic esophageal body' otherwise normal EGD  . ESOPHAGOGASTRODUODENOSCOPY N/A 11/19/2016   Dr. Gala Romney: moderate Schatzki ring s/p dirsuption with biopsy forceps, large hiatal hernia  . ESOPHAGOGASTRODUODENOSCOPY (EGD) WITH ESOPHAGEAL DILATION  09/01/2012   Dr. Gala Romney- schatzki's ring, hiatal hernia  . ESOPHAGOGASTRODUODENOSCOPY (EGD) WITH PROPOFOL N/A 02/08/2018   Procedure: ESOPHAGOGASTRODUODENOSCOPY (EGD) WITH PROPOFOL;  Surgeon: Daneil Dolin, MD;  [facility];  Service: Endoscopy;  Laterality: N/A;  10:30am  . FOOT ARTHRODESIS  [id number]   both feet  . HERNIA REPAIR     umbilical  . JOINT REPLACEMENT Right   . KNEE ARTHROSCOPY WITH LATERAL MENISECTOMY Right 01/27/2014   Procedure: RIGHT KNEE ARTHROSCOPY WITH PARTIAL LATERAL MENISECTOMY, ;  Surgeon: Johnny Bridge, MD;  Location: [place];  Service:  Orthopedics;  Laterality: Right;  . MALONEY DILATION N/A 08/10/2014   Procedure: [name] DILATION;  Surgeon: Daneil Dolin, MD;  [facility];  Service: Endoscopy;  Laterality: N/A;  [name] DILATION N/A 11/19/2016   Procedure: [name] DILATION;  Surgeon: Daneil Dolin, MD;  [facility];  Service: Endoscopy;  Laterality: N/A;  . [name] DILATION N/A 02/08/2018   Procedure: [name] DILATION;  Surgeon: Daneil Dolin, MD;  [facility];  Service: Endoscopy;  Laterality: N/A;  . SAVORY DILATION N/A 08/10/2014   Procedure: SAVORY DILATION;  Surgeon: Daneil Dolin, MD;  [facility];  Service: Endoscopy;  Laterality: N/A;  . SHOULDER OPEN ROTATOR CUFF REPAIR     BILAT  . TONSILLECTOMY    . TOTAL KNEE ARTHROPLASTY Right 12/11/2015   Procedure: TOTAL KNEE ARTHROPLASTY;  Surgeon: Marchia Bond, MD;  Location: [place];  Service: Orthopedics;  Laterality: Right;    Her Family History Is Significant For: Family History  Problem Relation Age of Onset  . Heart disease Mother   . Atrial fibrillation Sister   . Arrhythmia Brother   . Colon cancer Neg Hx     Her Social History Is Significant  For: Social History   Socioeconomic History  . Marital status: Married    Spouse name: Not on file  . Number of children: 3  . Years of education: Not on file  . Highest education level: Not on file  Occupational History  . 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 Financial resource strain: Not on file  . Food insecurity:    Worry: Not on file    Inability: Not on file  . Transportation needs:    Medical: Not on file    Non-medical: Not on file  Tobacco Use  . Smoking status: Former Smoker    Packs/day: 1.00    Years: 30.00    Pack years: 30.00    Types: Cigarettes    Start date: 07/07/1969    Last attempt to quit: 08/30/2011    Years since quitting: 6.4  . Smokeless tobacco: Never Used  Substance and Sexual Activity  . Alcohol  use: No    Alcohol/week: 0.0 oz  . Drug use: No  . Sexual activity: Yes    Partners: Male    Birth control/protection: None    Comment: spouse  Lifestyle  . Physical activity:    Days per week: Not on file    Minutes per session: Not on file  . Stress: Not on file  Relationships  . Social connections:    Talks on phone: Not on file    Gets together: Not on file    Attends religious service: Not on file    Active member of club or organization: Not on file    Attends meetings of clubs or organizations: Not on file    Relationship status: Not on file  Other Topics Concern  . Not on file  Social History Narrative  . Not on file    Her Allergies Are:  Allergies  Allergen Reactions  . Mesalamine Other (See Comments)    Pancreatitis   . Cefprozil Other (See Comments)    Aggravates Ulcerative colitis   . Cefuroxime Axetil Other (See Comments)    Aggravates Ulcerative colitis   . Morphine And Related Hives and Itching  . Penicillins Hives    Has patient had a PCN reaction causing immediate rash, facial/tongue/throat swelling, SOB or lightheadedness with hypotension: No Has patient had a PCN reaction causing severe rash involving mucus membranes or skin necrosis: No Has patient had a PCN reaction that required hospitalization No Has patient had a PCN reaction occurring within the last 10 years: yes If all of the above answers are "NO", then may proceed with Cephalosporin use.   . Aspirin Other (See Comments)    Affects the central nervous system in high doses . "shakey"  . Chicken Allergy Nausea And Vomiting and Rash  . Clarithromycin Nausea And Vomiting  . Codeine Other (See Comments)    Hallucinations/bad dreams.  . Entex Other (See Comments)    insomnia  :   Her Current Medications Are:  Outpatient Encounter Medications as of 02/17/2018  Medication Sig  . acetaminophen (TYLENOL) 500 MG tablet Take 1,000 mg by mouth every 6 (six) hours as needed for moderate pain or  headache.  . ADVAIR DISKUS 250-50 MCG/DOSE AEPB Inhale 1 puff into the lungs 2 (two) times daily.   Marland Kitchen albuterol (PROVENTIL) (2.5 MG/3ML) 0.083% nebulizer solution Take 2.5 mg by nebulization every 6 (six) hours as needed for wheezing or shortness of breath.  Marland Kitchen albuterol (PROVENTIL,VENTOLIN) 90 MCG/ACT inhaler Inhale 2 puffs into the lungs  every 6 (six) hours as needed for wheezing or shortness of breath.   Marland Kitchen aspirin EC 81 MG tablet Take 81 mg by mouth daily.  Marland Kitchen azelastine (ASTELIN) 137 MCG/SPRAY nasal spray 2 sprays by Nasal route 2 (two) times daily. Use in each nostril as directed   . balsalazide (COLAZAL) 750 MG capsule TAKE 3 CAPSULES TWICE A DAY  . buPROPion (WELLBUTRIN XL) 150 MG 24 hr tablet Take 150 mg by mouth daily.  . busPIRone (BUSPAR) 5 MG tablet Take 7.5 mg by mouth 2 (two) times daily.  . Calcium Carb-Cholecalciferol (CALCIUM-VITAMIN D) 600-400 MG-UNIT TABS Take 1 tablet by mouth 2 (two) times daily.  . cetirizine (ZYRTEC) 10 MG tablet Take 10 mg by mouth daily.  Marland Kitchen dicyclomine (BENTYL) 10 MG capsule TAKE 1 CAPSULE FOUR TIMES A DAY BEFORE MEALS AND AT BEDTIME  . DULoxetine (CYMBALTA) 30 MG capsule Take 90 mg by mouth 2 (two) times daily.   Marland Kitchen esomeprazole (NEXIUM) 40 MG capsule TAKE 1 CAPSULE EVERY MORNING  . famotidine-calcium carbonate-magnesium hydroxide (PEPCID COMPLETE) 10-800-165 MG CHEW chewable tablet Chew 1 tablet by mouth daily as needed (for breakthrough GERD).   . ferrous sulfate 325 (65 FE) MG tablet Take 325 mg by mouth daily with breakfast.  . fluticasone (VERAMYST) 27.5 MCG/SPRAY nasal spray Place 2 sprays into the nose 2 (two) times daily as needed for rhinitis.  Marland Kitchen ipratropium (ATROVENT) 0.03 % nasal spray Place 2 sprays into both nostrils every 12 (twelve) hours.  Marland Kitchen ketotifen (ALAWAY) 0.025 % ophthalmic solution Place 1 drop into both eyes daily.   Marland Kitchen lidocaine (LIDODERM) 5 % Place 1-2 patches onto the skin daily as needed (pain). Remove & Discard patch within 12  hours or as directed by MD  . methylcellulose (CITRUCEL) oral powder Take 1 packet by mouth daily as needed (constipation).   . Polyvinyl Alcohol (LUBRICANT DROPS OP) Apply 2 drops to eye 2 (two) times daily.  . pravastatin (PRAVACHOL) 20 MG tablet TAKE 1 TABLET EVERY EVENING (NEED APPOINTMENT FOR FURTHER REFILLS)  . pregabalin (LYRICA) 100 MG capsule Take 100 mg by mouth 2 (two) times daily.  . Probiotic CAPS Take 1 capsule by mouth daily.  Marland Kitchen tiZANidine (ZANAFLEX) 4 MG tablet Take 4 mg by mouth 3 (three) times daily as needed for muscle spasms.   . traMADol (ULTRAM-ER) 100 MG 24 hr tablet Take 100 mg by mouth at bedtime. 1 hour before sleep  . traZODone (DESYREL) 100 MG tablet Take 100 mg by mouth at bedtime.     No facility-administered encounter medications on file as of 02/17/2018.   : Review of Systems:  Out of a complete 14 point review of systems, all are reviewed and negative with the exception of these symptoms as listed below: Review of Systems  Neurological:       Pt presents today to discuss her tremors. Pt notices the tremors mainly in her arms. Pt is right handed.   Objective:  Neurological Exam  Physical Exam Physical Examination:   Vitals:   02/17/18 0932  BP: 138/81  Pulse: 88    General Examination: The patient is a very pleasant 67 y.o. female in no acute distress. She appears well-developed and well-nourished and well groomed.   HEENT: Normocephalic, atraumatic, pupils are equal, round and reactive to light and accommodation. Eyeglasses in places. Extraocular tracking is good without limitation to gaze excursion or nystagmus noted. Normal smooth pursuit is noted. Hearing is grossly intact, hearing aids. Face is symmetric with normal  facial animation and normal facial sensation. Speech is clear with no dysarthria noted. There is no hypophonia. There is no lip, neck/head, jaw or voice tremor. Neck is supple with full range of passive and active motion. Oropharynx exam  reveals: moderate mouth dryness, adequate dental hygiene. Mallampati is class. Tongue protrudes centrally and palate elevates symmetrically.    Chest: Clear to auscultation without wheezing, rhonchi or crackles noted.  Heart: S1+S2+0, regular and normal without murmurs, rubs or gallops noted.   Abdomen: Soft, non-tender and non-distended with normal bowel sounds appreciated on auscultation.  Extremities: There is no pitting edema in the distal lower extremities bilaterally. Pedal pulses are intact.  Skin: Warm and dry without trophic changes noted.  Musculoskeletal: exam reveals arthritic changes in the hands and L knee. She has a soft brace around her left knee.  Neurologically:  Mental status: The patient is awake, alert and oriented in all 4 spheres. Her immediate and remote memory, attention, language skills and fund of knowledge are appropriate. There is no evidence of aphasia, agnosia, apraxia or anomia. Speech is clear with normal prosody and enunciation. Thought process is linear. Mood is normal and affect is normal.  Cranial nerves II - XII are as described above under HEENT exam. In addition: shoulder shrug is normal with equal shoulder height noted. Motor exam: Normal bulk, strength and tone is noted. There is no drift, resting tremor or rebound. Romberg is not tested for safety reasons. Reflexes are 1+ in the UEs and absent in the LEs. Fine motor skills and coordination: intact grossly.  On Archimedes spiral drawing she has a coarse tremor bilaterally, handwriting is mildly tremulous, legible, not micrographic. She has a mild to moderate postural and slight action tremor in both upper extremities, no lower extremity tremor, no intention tremor. Cerebellar testing: No dysmetria or intention tremor on finger to nose testing.  Sensory exam: intact to light touch. Gait, station and balance: She stands with mild difficulty and pushes herself up. She is able to walk some without her  single-point cane, slight limp on the left. Preserved arm swing noted.   Assessment and Plan:   In summary, SHEZA STRICKLAND is a very pleasant 67 y.o.-year old female with an underlying medical history of allergies, asthma, diabetes, diverticulosis, hearing loss, reflux disease, hyperlipidemia, obesity, irritable bowel syndrome, osteoarthritis, history of pancreatitis, ulcerative colitis, hx of PE (finished Xarelto Rx after about 9 months), status post multiple surgeries including tonsillectomy, shoulder surgery, knee surgery, foot surgeries, multiple esophageal dilatations, R knee arthroscopic, then replacement surgery, left ankle surgery in 9/18, hernia repair, cholecystectomy, who presents for neurologic consultation of her bilateral UE tremors of many years' duration. Her history, FHx and physical exam are in keeping with essential tremor. She is advised that given her medical history and multiple medications I would not recommend any additional symptomatic treatment for her tremor at this point. Given her history of asthma which per her report dates back to childhood, I would not recommend beta blocker and given her multiple other sedating medications I would not recommend a trial of Mysoline. I would like to proceed with a brain MRI with and without contrast to rule out any structural cause of her tremor. We will call her with her MRI results. In addition, her tremor may be worse because she has been on Cymbalta for many years, tremors can occur in a low percentage of patients who take Cymbalta and tends to be dose-dependent. Nevertheless, since Cymbalta has worked well for her  from the mood standpoint, I did not suggest that she come off of it. Prior to that she was on Lexapro which did not work well for her. As long as her brain MRI is age-appropriate/nonrevealing, I will see her back on an as-needed basis. I answered all her questions today and she was in agreement. Thank you very much for allowing  me to participate in the care of this nice patient. If I can be of any further assistance to you please do not hesitate to call me at [phone].  Sincerely,   Star Age, MD, PhD

## 2018-02-17 NOTE — Patient Instructions (Signed)
You have a tremor in both hands, and given family tremor, likely, you have a diagnosis of Essential Tremor, it may be possible, that it is worse from taking the Cymbalta - this can cause tremor in about 3% of patients and is dose dependent.   Please remember, that any kind of tremor may be exacerbated by anxiety, anger, nervousness, excitement, dehydration, sleep deprivation, by caffeine, and low blood sugar values or blood sugar fluctuations. Some medications, especially some antidepressants and lithium can cause or exacerbate tremors. Tremors may temporarily calm down or subside with the use of a benzodiazepine such as Valium or related medications and with alcohol. Be aware, however, that drinking alcohol is not an approved or appropriate treatment for tremor control and long-term use of benzodiazepines such as Valium, lorazepam, alprazolam, or clonazepam can cause habit formation, physical and psychological addiction. There are very few medications that symptomatically help with tremor reduction, none are without potential side effects.   We will do a brain scan, called MRI and call you with the test results. We will have to schedule you for this on a separate date. This test requires authorization from your insurance, and we will take care of the insurance process.  I would not recommend any medications for your tremor, for fear of interaction with other meds or side effects.  So long as your MRI is age-appropriate/non-revealing, I will see you back as needed.

## 2018-02-17 NOTE — Telephone Encounter (Signed)
medicare/tricare order sent to GI. They will reach out to the pt to schedule.

## 2018-02-26 ENCOUNTER — Encounter: Payer: Self-pay | Admitting: Cardiology

## 2018-02-26 DIAGNOSIS — Z6831 Body mass index (BMI) 31.0-31.9, adult: Secondary | ICD-10-CM | POA: Diagnosis not present

## 2018-02-26 DIAGNOSIS — Z8669 Personal history of other diseases of the nervous system and sense organs: Secondary | ICD-10-CM | POA: Diagnosis not present

## 2018-02-26 DIAGNOSIS — F419 Anxiety disorder, unspecified: Secondary | ICD-10-CM | POA: Diagnosis not present

## 2018-02-26 DIAGNOSIS — R42 Dizziness and giddiness: Secondary | ICD-10-CM | POA: Diagnosis not present

## 2018-02-26 DIAGNOSIS — Z86711 Personal history of pulmonary embolism: Secondary | ICD-10-CM | POA: Diagnosis not present

## 2018-02-26 DIAGNOSIS — Z79899 Other long term (current) drug therapy: Secondary | ICD-10-CM | POA: Diagnosis not present

## 2018-02-26 DIAGNOSIS — F329 Major depressive disorder, single episode, unspecified: Secondary | ICD-10-CM | POA: Diagnosis not present

## 2018-02-26 DIAGNOSIS — Z7951 Long term (current) use of inhaled steroids: Secondary | ICD-10-CM | POA: Diagnosis not present

## 2018-02-26 DIAGNOSIS — Z7982 Long term (current) use of aspirin: Secondary | ICD-10-CM | POA: Diagnosis not present

## 2018-02-26 DIAGNOSIS — J449 Chronic obstructive pulmonary disease, unspecified: Secondary | ICD-10-CM | POA: Diagnosis not present

## 2018-02-26 DIAGNOSIS — I4891 Unspecified atrial fibrillation: Secondary | ICD-10-CM | POA: Diagnosis not present

## 2018-02-26 DIAGNOSIS — J0100 Acute maxillary sinusitis, unspecified: Secondary | ICD-10-CM | POA: Diagnosis not present

## 2018-02-26 DIAGNOSIS — G4730 Sleep apnea, unspecified: Secondary | ICD-10-CM | POA: Diagnosis not present

## 2018-02-26 DIAGNOSIS — R251 Tremor, unspecified: Secondary | ICD-10-CM | POA: Diagnosis not present

## 2018-02-26 DIAGNOSIS — E119 Type 2 diabetes mellitus without complications: Secondary | ICD-10-CM | POA: Diagnosis not present

## 2018-02-26 DIAGNOSIS — Z87891 Personal history of nicotine dependence: Secondary | ICD-10-CM | POA: Diagnosis not present

## 2018-02-27 ENCOUNTER — Inpatient Hospital Stay: Admission: RE | Admit: 2018-02-27 | Payer: Medicare Other | Source: Ambulatory Visit

## 2018-02-27 DIAGNOSIS — I4891 Unspecified atrial fibrillation: Secondary | ICD-10-CM | POA: Diagnosis not present

## 2018-03-01 DIAGNOSIS — E7800 Pure hypercholesterolemia, unspecified: Secondary | ICD-10-CM | POA: Diagnosis not present

## 2018-03-01 DIAGNOSIS — Z79899 Other long term (current) drug therapy: Secondary | ICD-10-CM | POA: Diagnosis not present

## 2018-03-01 DIAGNOSIS — N189 Chronic kidney disease, unspecified: Secondary | ICD-10-CM | POA: Diagnosis not present

## 2018-03-01 DIAGNOSIS — K219 Gastro-esophageal reflux disease without esophagitis: Secondary | ICD-10-CM | POA: Diagnosis not present

## 2018-03-01 DIAGNOSIS — G4733 Obstructive sleep apnea (adult) (pediatric): Secondary | ICD-10-CM | POA: Diagnosis not present

## 2018-03-01 DIAGNOSIS — I2699 Other pulmonary embolism without acute cor pulmonale: Secondary | ICD-10-CM | POA: Diagnosis not present

## 2018-03-01 DIAGNOSIS — E782 Mixed hyperlipidemia: Secondary | ICD-10-CM | POA: Diagnosis not present

## 2018-03-01 DIAGNOSIS — F321 Major depressive disorder, single episode, moderate: Secondary | ICD-10-CM | POA: Diagnosis not present

## 2018-03-01 DIAGNOSIS — E1165 Type 2 diabetes mellitus with hyperglycemia: Secondary | ICD-10-CM | POA: Diagnosis not present

## 2018-03-03 DIAGNOSIS — Z6831 Body mass index (BMI) 31.0-31.9, adult: Secondary | ICD-10-CM | POA: Diagnosis not present

## 2018-03-03 DIAGNOSIS — K219 Gastro-esophageal reflux disease without esophagitis: Secondary | ICD-10-CM | POA: Diagnosis not present

## 2018-03-03 DIAGNOSIS — J4520 Mild intermittent asthma, uncomplicated: Secondary | ICD-10-CM | POA: Diagnosis not present

## 2018-03-03 DIAGNOSIS — K5190 Ulcerative colitis, unspecified, without complications: Secondary | ICD-10-CM | POA: Diagnosis not present

## 2018-03-03 DIAGNOSIS — F329 Major depressive disorder, single episode, unspecified: Secondary | ICD-10-CM | POA: Diagnosis not present

## 2018-03-03 DIAGNOSIS — G4733 Obstructive sleep apnea (adult) (pediatric): Secondary | ICD-10-CM | POA: Diagnosis not present

## 2018-03-03 DIAGNOSIS — I4891 Unspecified atrial fibrillation: Secondary | ICD-10-CM | POA: Diagnosis not present

## 2018-03-03 DIAGNOSIS — E119 Type 2 diabetes mellitus without complications: Secondary | ICD-10-CM | POA: Diagnosis not present

## 2018-03-17 ENCOUNTER — Encounter: Payer: Self-pay | Admitting: *Deleted

## 2018-03-18 ENCOUNTER — Encounter: Payer: Self-pay | Admitting: Cardiology

## 2018-03-18 ENCOUNTER — Ambulatory Visit (INDEPENDENT_AMBULATORY_CARE_PROVIDER_SITE_OTHER): Payer: Medicare Other | Admitting: Cardiology

## 2018-03-18 ENCOUNTER — Other Ambulatory Visit: Payer: Self-pay

## 2018-03-18 VITALS — BP 113/70 | HR 84 | Ht 64.0 in | Wt 185.0 lb

## 2018-03-18 DIAGNOSIS — I4891 Unspecified atrial fibrillation: Secondary | ICD-10-CM

## 2018-03-18 DIAGNOSIS — E782 Mixed hyperlipidemia: Secondary | ICD-10-CM | POA: Diagnosis not present

## 2018-03-18 DIAGNOSIS — R0789 Other chest pain: Secondary | ICD-10-CM

## 2018-03-18 MED ORDER — METOPROLOL TARTRATE 25 MG PO TABS: 12.5000 mg | ORAL_TABLET | Freq: Two times a day (BID) | ORAL | 6 refills | Status: DC

## 2018-03-18 MED ORDER — ELIQUIS 5 MG PO TABS: 5.0000 mg | ORAL_TABLET | Freq: Two times a day (BID) | ORAL | 3 refills | Status: DC

## 2018-03-18 NOTE — Patient Instructions (Signed)
Medication Instructions:   Stop Aspirin.  Begin Lopressor 12.8m twice a day.    Continue all other medications.    Labwork: none  Testing/Procedures: none  Follow-Up: Your physician wants you to follow up in: 6 months.  You will receive a reminder letter in the mail one-two months in advance.  If you don't receive a letter, please call our office to schedule the follow up appointment   Any Other Special Instructions Will Be Listed Below (If Applicable).  If you need a refill on your cardiac medications before your next appointment, please call your pharmacy.

## 2018-03-18 NOTE — Progress Notes (Signed)
Clinical Summary [name] is a 67 y.o.female  seen today for follow up of the following medical problems.   1. Chest pain - history of atypical chest pain.  - 06/2014 Lexiscan MPI without ischemia - she has had prior EGDs with dilatation as well that has previously improved her symptoms. .  - 05/2017 echo with normal LVEF    - no recent chest pain. NO SOB or DOE   2. Hyperlipidemia - tolerating pravastatin well, previous muscle aches on lipitor - last labs with pcp  3. Ulcerating colitis - followed by GI   4. Dysphagia - followed by GI  6. Afib - new diagnosis during recent pcp visit. She was sent to [facility] for evaluation - she was started on eliquis  Past Medical History:  Diagnosis Date  . Anemia   . Anginal pain ([place])    PATIENT STATES DOCT THINKS IS FIBROMYALGIA  . Arthritis   . Asthma   . Blood clot in lung last march  . 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 Chronic back pain   . COPD (chronic obstructive pulmonary disease) ([place])   . DDD (degenerative disc disease)   . Degenerative tear of posterior horn of lateral meniscus of right knee 01/27/2014  . Depression   . Diabetes mellitus without complication ([place])   . Diverticulitis   . Fibromyalgia   . GERD (gastroesophageal reflux disease)   . Headache    HX MIGRAINES    . Hiatal hernia   . HOH (hard of hearing)    right  . Pancreatitis   . Pneumonia    1 YR  . PONV (postoperative nausea and vomiting)    only happened after knee arthroscopy  . Primary localized osteoarthritis of right knee 12/11/2015  . PVC's (premature ventricular contractions)   . Schatzki's ring   . Seizures ([place])    had febrile seizures as child; none since childhood and on no meds  . Shortness of breath dyspnea    WITH EXERTION   . Sleep apnea    CPAP at night  . Ulcerative colitis 2007  . Wears dentures    full top-partial bottom     Allergies  Allergen Reactions  . Mesalamine Other (See Comments)   Pancreatitis   . Cefprozil Other (See Comments)    Aggravates Ulcerative colitis   . Cefuroxime Axetil Other (See Comments)    Aggravates Ulcerative colitis   . Morphine And Related Hives and Itching  . Penicillins Hives    Has patient had a PCN reaction causing immediate rash, facial/tongue/throat swelling, SOB or lightheadedness with hypotension: No Has patient had a PCN reaction causing severe rash involving mucus membranes or skin necrosis: No Has patient had a PCN reaction that required hospitalization No Has patient had a PCN reaction occurring within the last 10 years: yes If all of the above answers are "NO", then may proceed with Cephalosporin use.   . Aspirin Other (See Comments)    Affects the central nervous system in high doses . "shakey"  . Chicken Allergy Nausea And Vomiting and Rash  . Clarithromycin Nausea And Vomiting  . Codeine Other (See Comments)    Hallucinations/bad dreams.  . Entex Other (See Comments)    insomnia     Current Outpatient Medications  Medication Sig Dispense Refill  . acetaminophen (TYLENOL) 500 MG tablet Take 1,000 mg by mouth every 6 (six) hours as needed for moderate pain or headache.    Marland Kitchen  ADVAIR DISKUS 250-50 MCG/DOSE AEPB Inhale 1 puff into the lungs 2 (two) times daily.     Marland Kitchen albuterol (PROVENTIL) (2.5 MG/3ML) 0.083% nebulizer solution Take 2.5 mg by nebulization every 6 (six) hours as needed for wheezing or shortness of breath.    Marland Kitchen albuterol (PROVENTIL,VENTOLIN) 90 MCG/ACT inhaler Inhale 2 puffs into the lungs every 6 (six) hours as needed for wheezing or shortness of breath.     Marland Kitchen aspirin EC 81 MG tablet Take 81 mg by mouth daily.    Marland Kitchen azelastine (ASTELIN) 137 MCG/SPRAY nasal spray 2 sprays by Nasal route 2 (two) times daily. Use in each nostril as directed     . balsalazide (COLAZAL) 750 MG capsule TAKE 3 CAPSULES TWICE A DAY 540 capsule 3  . buPROPion (WELLBUTRIN XL) 150 MG 24 hr tablet Take 150 mg by mouth daily.    . busPIRone  (BUSPAR) 5 MG tablet Take 7.5 mg by mouth 2 (two) times daily.    . Calcium Carb-Cholecalciferol (CALCIUM-VITAMIN D) 600-400 MG-UNIT TABS Take 1 tablet by mouth 2 (two) times daily.    . cetirizine (ZYRTEC) 10 MG tablet Take 10 mg by mouth daily.    Marland Kitchen dicyclomine (BENTYL) 10 MG capsule TAKE 1 CAPSULE FOUR TIMES A DAY BEFORE MEALS AND AT BEDTIME 360 capsule 3  . DULoxetine (CYMBALTA) 30 MG capsule Take 90 mg by mouth daily.     Marland Kitchen esomeprazole (NEXIUM) 40 MG capsule TAKE 1 CAPSULE EVERY MORNING 90 capsule 3  . famotidine-calcium carbonate-magnesium hydroxide (PEPCID COMPLETE) 10-800-165 MG CHEW chewable tablet Chew 1 tablet by mouth daily as needed (for breakthrough GERD).     . ferrous sulfate 325 (65 FE) MG tablet Take 325 mg by mouth daily with breakfast.    . fluticasone (VERAMYST) 27.5 MCG/SPRAY nasal spray Place 2 sprays into the nose 2 (two) times daily as needed for rhinitis.    Marland Kitchen ipratropium (ATROVENT) 0.03 % nasal spray Place 2 sprays into both nostrils every 12 (twelve) hours.    Marland Kitchen ketotifen (ALAWAY) 0.025 % ophthalmic solution Place 1 drop into both eyes daily.     Marland Kitchen lidocaine (LIDODERM) 5 % Place 1-2 patches onto the skin daily as needed (pain). Remove & Discard patch within 12 hours or as directed by MD    . methylcellulose (CITRUCEL) oral powder Take 1 packet by mouth daily as needed (constipation).     . Polyvinyl Alcohol (LUBRICANT DROPS OP) Apply 2 drops to eye 2 (two) times daily.    . pravastatin (PRAVACHOL) 20 MG tablet TAKE 1 TABLET EVERY EVENING (NEED APPOINTMENT FOR FURTHER REFILLS) 90 tablet 3  . pregabalin (LYRICA) 100 MG capsule Take 100 mg by mouth 2 (two) times daily.    . Probiotic CAPS Take 1 capsule by mouth daily.    Marland Kitchen tiZANidine (ZANAFLEX) 4 MG tablet Take 4 mg by mouth 3 (three) times daily as needed for muscle spasms.   1  . traMADol (ULTRAM-ER) 100 MG 24 hr tablet Take 100 mg by mouth at bedtime. 1 hour before sleep    . traZODone (DESYREL) 100 MG tablet Take 100  mg by mouth at bedtime.       No current facility-administered medications for this visit.      Past Surgical History:  Procedure Laterality Date  . arch and foot    . CERVICAL CONE BIOPSY    . CHOLECYSTECTOMY    . COLONOSCOPY  12/03/05   Rourk-marked inflammatory changes of the rectum, left colon, pan  colitis, internal hemorrhoids, sigmoid diverticula  . COLONOSCOPY N/A 10/20/2013   Dr. Rourk:Colonic diverticulosis. colonic mucosal ulcerations involving segment of sigmoid colon, atypical for UC. sigmoid colon path with chronic mildly active colitis consistent with IBD, other biopsies including ascending, transverse, descending colon and rectum unremarkable.   . COLONOSCOPY N/A 11/19/2016   Dr. Gala Romney: Diverticulosis, 7 millimeter polyp removed from the descending colon, internal hemorrhoids, segmental biopsies unremarkable.  Marland Kitchen DILATION AND CURETTAGE OF UTERUS    . ESOPHAGEAL DILATION N/A 09/20/2015   Procedure: ESOPHAGEAL DILATION;  Surgeon: Daneil Dolin, MD;  [facility];  Service: Endoscopy;  Laterality: N/A;  . ESOPHAGOGASTRODUODENOSCOPY  01/28/2011   Rourk- Schatzki ring, s/p 79F, otherwise normal/Hiatal hernia otherwise normal stomach D1 and D2  . ESOPHAGOGASTRODUODENOSCOPY  11/30/08   Rourk-Schatzki's ring status post dilation, hiatal hernia  . ESOPHAGOGASTRODUODENOSCOPY  07/12/2007   Dilation with 56 French, Schatzki's ring  . ESOPHAGOGASTRODUODENOSCOPY N/A 08/10/2014   Dr.Rourk- prominent schatzki's ring s/p maloney dilation and disruption, moderate sized hiatal hernia  . ESOPHAGOGASTRODUODENOSCOPY N/A 09/20/2015   RMR: Schatzki's ring status post dilation as described above. Rather large hiatal hernia; somewhat baggy atonic esophageal body' otherwise normal EGD  . ESOPHAGOGASTRODUODENOSCOPY N/A 11/19/2016   Dr. Gala Romney: moderate Schatzki ring s/p dirsuption with biopsy forceps, large hiatal hernia  . ESOPHAGOGASTRODUODENOSCOPY (EGD) WITH ESOPHAGEAL DILATION  09/01/2012    Dr. Gala Romney- schatzki's ring, hiatal hernia  . ESOPHAGOGASTRODUODENOSCOPY (EGD) WITH PROPOFOL N/A 02/08/2018   Procedure: ESOPHAGOGASTRODUODENOSCOPY (EGD) WITH PROPOFOL;  Surgeon: Daneil Dolin, MD;  [facility];  Service: Endoscopy;  Laterality: N/A;  10:30am  . FOOT ARTHRODESIS  [id number]   both feet  . HERNIA REPAIR     umbilical  . JOINT REPLACEMENT Right   . KNEE ARTHROSCOPY WITH LATERAL MENISECTOMY Right 01/27/2014   Procedure: RIGHT KNEE ARTHROSCOPY WITH PARTIAL LATERAL MENISECTOMY, ;  Surgeon: Johnny Bridge, MD;  Location: [place];  Service: Orthopedics;  Laterality: Right;  . MALONEY DILATION N/A 08/10/2014   Procedure: [name] DILATION;  Surgeon: Daneil Dolin, MD;  [facility];  Service: Endoscopy;  Laterality: N/A;  [name] DILATION N/A 11/19/2016   Procedure: [name] DILATION;  Surgeon: Daneil Dolin, MD;  [facility];  Service: Endoscopy;  Laterality: N/A;  . [name] DILATION N/A 02/08/2018   Procedure: [name] DILATION;  Surgeon: Daneil Dolin, MD;  [facility];  Service: Endoscopy;  Laterality: N/A;  . SAVORY DILATION N/A 08/10/2014   Procedure: SAVORY DILATION;  Surgeon: Daneil Dolin, MD;  [facility];  Service: Endoscopy;  Laterality: N/A;  . SHOULDER OPEN ROTATOR CUFF REPAIR     BILAT  . TONSILLECTOMY    . TOTAL KNEE ARTHROPLASTY Right 12/11/2015   Procedure: TOTAL KNEE ARTHROPLASTY;  Surgeon: Marchia Bond, MD;  Location: [facility];  Service: Orthopedics;  Laterality: Right;     Allergies  Allergen Reactions  . Mesalamine Other (See Comments)    Pancreatitis   . Cefprozil Other (See Comments)    Aggravates Ulcerative colitis   . Cefuroxime Axetil Other (See Comments)    Aggravates Ulcerative colitis   . Morphine And Related Hives and Itching  . Penicillins Hives    Has patient had a PCN reaction causing immediate rash, facial/tongue/throat swelling, SOB or lightheadedness with  hypotension: No Has patient had a PCN reaction causing severe rash involving mucus membranes or skin necrosis: No Has patient had a PCN reaction that required hospitalization  No Has patient had a PCN reaction occurring within the last 10 years: yes If all of the above answers are "NO", then may proceed with Cephalosporin use.   . Aspirin Other (See Comments)    Affects the central nervous system in high doses . "shakey"  . Chicken Allergy Nausea And Vomiting and Rash  . Clarithromycin Nausea And Vomiting  . Codeine Other (See Comments)    Hallucinations/bad dreams.  . Entex Other (See Comments)    insomnia      Family History  Problem Relation Age of Onset  . Heart disease Mother   . Atrial fibrillation Sister   . Arrhythmia Brother   . Colon cancer Neg Hx      Social History [name] reports that she quit smoking about 6 years ago. Her smoking use included cigarettes. She started smoking about 48 years ago. She has a 30.00 pack-year smoking history. She has never used smokeless tobacco. [name] reports that she does not drink alcohol.   Review of Systems CONSTITUTIONAL: No weight loss, fever, chills, weakness or fatigue.  HEENT: Eyes: No visual loss, blurred vision, double vision or yellow sclerae.No hearing loss, sneezing, congestion, runny nose or sore throat.  SKIN: No rash or itching.  CARDIOVASCULAR: per hpi RESPIRATORY: per hpi GASTROINTESTINAL: No anorexia, nausea, vomiting or diarrhea. No abdominal pain or blood.  GENITOURINARY: No burning on urination, no polyuria NEUROLOGICAL: No headache, dizziness, syncope, paralysis, ataxia, numbness or tingling in the extremities. No change in bowel or bladder control.  MUSCULOSKELETAL: No muscle, back pain, joint pain or stiffness.  LYMPHATICS: No enlarged nodes. No history of splenectomy.  PSYCHIATRIC: No history of depression or anxiety.  ENDOCRINOLOGIC: No reports of sweating, cold or heat intolerance. No polyuria  or polydipsia.  Marland Kitchen   Physical Examination Vitals:   03/18/18 0920  BP: 113/70  Pulse: 84  SpO2: 94%   Vitals:   03/18/18 0920  Weight: 185 lb (83.9 kg)  Height: 5' 4"  (1.626 m)    Gen: resting comfortably, no acute distress HEENT: no scleral icterus, pupils equal round and reactive, no palptable cervical adenopathy,  CV: RRR, no m/r/g, no jvd Resp: Clear to auscultation bilaterally GI: abdomen is soft, non-tender, non-distended, normal bowel sounds, no hepatosplenomegaly MSK: extremities are warm, no edema.  Skin: warm, no rash Neuro:  no focal deficits Psych: appropriate affect   Diagnostic Studies 06/2014 Lexiscan MPI IMPRESSION: 1. No reversible ischemia or infarction.  2. Normal left ventricular wall motion.  3. Left ventricular ejection fraction 60%  4. Low-risk stress test findings*.   07/2014 Echo Study Conclusions  - Left ventricle: The cavity size was normal. Wall thickness was normal. Systolic function was normal. The estimated ejection fraction was in the range of 60% to 65%. - Aortic valve: Valve area (VTI): 3.31 cm^2. Valve area (Vmax): 3.27 cm^2. Valve area (Vmean): 2.77 cm^2. - Left atrium: The atrium was mildly dilated. - Technically difficult study.  05/2017 echo [facility] LVEF 25-00%, normal diastolic function,        Assessment and Plan  1. Chest pain - atypical symptoms most consistent with GI etiology - previous negative stress test, echo in 2015 and recently with normal LVEF and no wall motion abnormalities - no recent symptoms, continue to monitor.   2. Hyperlipidemia -  Previous side effects on lipitor, tolerating pravastatin well.  - continue current statin, request pcp labs  3. Afib - recent diagnosis. No significant symptmos, she was appropriately started on anticoagulation and  we will continue - request hospital records.  - start lopressor 12.72m bid, stop ASA since on eliquis.        JArnoldo Lenis M.D.

## 2018-03-28 ENCOUNTER — Encounter: Payer: Self-pay | Admitting: Cardiology

## 2018-04-09 DIAGNOSIS — H2513 Age-related nuclear cataract, bilateral: Secondary | ICD-10-CM | POA: Diagnosis not present

## 2018-04-09 DIAGNOSIS — H5203 Hypermetropia, bilateral: Secondary | ICD-10-CM | POA: Diagnosis not present

## 2018-04-09 DIAGNOSIS — H02201 Unspecified lagophthalmos right upper eyelid: Secondary | ICD-10-CM | POA: Diagnosis not present

## 2018-04-09 DIAGNOSIS — E119 Type 2 diabetes mellitus without complications: Secondary | ICD-10-CM | POA: Diagnosis not present

## 2018-04-09 DIAGNOSIS — H524 Presbyopia: Secondary | ICD-10-CM | POA: Diagnosis not present

## 2018-04-09 DIAGNOSIS — H43813 Vitreous degeneration, bilateral: Secondary | ICD-10-CM | POA: Diagnosis not present

## 2018-04-09 DIAGNOSIS — H52223 Regular astigmatism, bilateral: Secondary | ICD-10-CM | POA: Diagnosis not present

## 2018-04-28 DIAGNOSIS — R350 Frequency of micturition: Secondary | ICD-10-CM | POA: Diagnosis not present

## 2018-04-28 DIAGNOSIS — Z6831 Body mass index (BMI) 31.0-31.9, adult: Secondary | ICD-10-CM | POA: Diagnosis not present

## 2018-04-28 DIAGNOSIS — J069 Acute upper respiratory infection, unspecified: Secondary | ICD-10-CM | POA: Diagnosis not present

## 2018-05-27 ENCOUNTER — Other Ambulatory Visit: Payer: Self-pay | Admitting: Gastroenterology

## 2018-05-27 DIAGNOSIS — Z1231 Encounter for screening mammogram for malignant neoplasm of breast: Secondary | ICD-10-CM | POA: Diagnosis not present

## 2018-06-01 ENCOUNTER — Other Ambulatory Visit: Payer: Self-pay | Admitting: *Deleted

## 2018-06-01 MED ORDER — METOPROLOL TARTRATE 25 MG PO TABS: 12.5000 mg | ORAL_TABLET | Freq: Two times a day (BID) | ORAL | 2 refills | Status: DC

## 2018-06-10 DIAGNOSIS — E7801 Familial hypercholesterolemia: Secondary | ICD-10-CM | POA: Diagnosis not present

## 2018-06-10 DIAGNOSIS — E782 Mixed hyperlipidemia: Secondary | ICD-10-CM | POA: Diagnosis not present

## 2018-06-10 DIAGNOSIS — E1165 Type 2 diabetes mellitus with hyperglycemia: Secondary | ICD-10-CM | POA: Diagnosis not present

## 2018-06-10 DIAGNOSIS — K219 Gastro-esophageal reflux disease without esophagitis: Secondary | ICD-10-CM | POA: Diagnosis not present

## 2018-06-16 DIAGNOSIS — Z6832 Body mass index (BMI) 32.0-32.9, adult: Secondary | ICD-10-CM | POA: Diagnosis not present

## 2018-06-16 DIAGNOSIS — Z1331 Encounter for screening for depression: Secondary | ICD-10-CM | POA: Diagnosis not present

## 2018-06-16 DIAGNOSIS — E119 Type 2 diabetes mellitus without complications: Secondary | ICD-10-CM | POA: Diagnosis not present

## 2018-06-16 DIAGNOSIS — Z23 Encounter for immunization: Secondary | ICD-10-CM | POA: Diagnosis not present

## 2018-06-16 DIAGNOSIS — J4520 Mild intermittent asthma, uncomplicated: Secondary | ICD-10-CM | POA: Diagnosis not present

## 2018-06-16 DIAGNOSIS — Z1389 Encounter for screening for other disorder: Secondary | ICD-10-CM | POA: Diagnosis not present

## 2018-06-16 DIAGNOSIS — I4891 Unspecified atrial fibrillation: Secondary | ICD-10-CM | POA: Diagnosis not present

## 2018-06-16 DIAGNOSIS — G252 Other specified forms of tremor: Secondary | ICD-10-CM | POA: Diagnosis not present

## 2018-06-29 DIAGNOSIS — L600 Ingrowing nail: Secondary | ICD-10-CM | POA: Diagnosis not present

## 2018-06-29 DIAGNOSIS — L601 Onycholysis: Secondary | ICD-10-CM | POA: Diagnosis not present

## 2018-06-29 DIAGNOSIS — M79674 Pain in right toe(s): Secondary | ICD-10-CM | POA: Diagnosis not present

## 2018-07-02 DIAGNOSIS — Z6832 Body mass index (BMI) 32.0-32.9, adult: Secondary | ICD-10-CM | POA: Diagnosis not present

## 2018-07-02 DIAGNOSIS — J019 Acute sinusitis, unspecified: Secondary | ICD-10-CM | POA: Diagnosis not present

## 2018-07-08 ENCOUNTER — Other Ambulatory Visit: Payer: Self-pay | Admitting: Gastroenterology

## 2018-07-13 DIAGNOSIS — L600 Ingrowing nail: Secondary | ICD-10-CM | POA: Diagnosis not present

## 2018-07-31 ENCOUNTER — Other Ambulatory Visit: Payer: Self-pay | Admitting: Gastroenterology

## 2018-08-09 ENCOUNTER — Other Ambulatory Visit: Payer: Self-pay | Admitting: Cardiology

## 2018-08-09 DIAGNOSIS — M19011 Primary osteoarthritis, right shoulder: Secondary | ICD-10-CM | POA: Diagnosis not present

## 2018-08-09 DIAGNOSIS — M25511 Pain in right shoulder: Secondary | ICD-10-CM | POA: Diagnosis not present

## 2018-08-11 ENCOUNTER — Encounter: Payer: Self-pay | Admitting: Nurse Practitioner

## 2018-08-11 ENCOUNTER — Ambulatory Visit (INDEPENDENT_AMBULATORY_CARE_PROVIDER_SITE_OTHER): Payer: Medicare Other | Admitting: Nurse Practitioner

## 2018-08-11 VITALS — BP 143/81 | HR 68 | Temp 98.7°F | Ht 64.0 in | Wt 189.2 lb

## 2018-08-11 DIAGNOSIS — K5100 Ulcerative (chronic) pancolitis without complications: Secondary | ICD-10-CM | POA: Diagnosis not present

## 2018-08-11 DIAGNOSIS — D509 Iron deficiency anemia, unspecified: Secondary | ICD-10-CM

## 2018-08-11 DIAGNOSIS — R197 Diarrhea, unspecified: Secondary | ICD-10-CM | POA: Diagnosis not present

## 2018-08-11 NOTE — Assessment & Plan Note (Signed)
She has does have a history of iron deficiency anemia which is been essentially normal for the past 1 to 1-1/2 years.  I will recheck her CBC and iron studies one last time and if they remain normal then she will essentially be 2 years with normal serologies and we can likely back off.  Continue current medications.  Follow-up in 2 to 3 months.

## 2018-08-11 NOTE — Patient Instructions (Signed)
1. Have your labs completed when you are able to. 2. You collect a liquid stool sample you can bring it to the lab for stool studies. 3. Continue your current medications. 4. Start taking probiotics daily for about 1 to 2 months.  I can provide you with samples to get you started.  You can obtain further probiotics over-the-counter at the pharmacy. 5. Return for follow-up in 2 to 3 months. 6. Call us if you have any questions or concerns.  At [facility] we value your feedback. You may receive a survey about your visit today. Please share your experience as we strive to create trusting relationships with our patients to provide genuine, compassionate, quality care.  We appreciate your understanding and patience as we review any laboratory studies, imaging, and other diagnostic tests that are ordered as we care for you. Our office policy is 5 business days for review of these results, and any emergent or urgent results are addressed in a timely manner for your best interest. If you do not hear from our office in 1 week, please contact us.   We also encourage the use of MyChart, which contains your medical information for your review as well. If you are not enrolled in this feature, an access code is on this after visit summary for your convenience. Thank you for allowing [place] to be involved in your care.  It was great to meet you today!  I hope you have a great Fall!!

## 2018-08-11 NOTE — Assessment & Plan Note (Signed)
The patient has persistent diarrhea.  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  Stopping metformin helped as well.  Her diarrhea was improving.  However, since then her stools seem to worsen.  She has postprandial stools particularly in the morning.  She often has fecal urgency and this limits her quality of life as she cannot go long distances to visit her grandchildren and has to know "every bathroom between start to finish" when traveling.  At this point I will recheck stool studies to ensure she has not picked up another infection.  Continue Bentyl for now.  Start probiotics after stool studies are completed and recommend she complete these for 1 to 2 months.  Follow-up in 2 to 3 months.  Call if any worsening symptoms.  Further recommendations to follow based on clinical response and lab results.

## 2018-08-11 NOTE — Progress Notes (Signed)
Referring Provider: Riley Lam Primary Care Physician:  Jalene Mullet, PA-C Primary GI:  Dr. Gala Romney  Chief Complaint  Patient presents with  . ida    f/u.  Marland Kitchen Diarrhea    After she eats breakfast and sometimes she has episode in the evening    HPI:   [name] is a 67 y.o. female who presents for iron deficiency anemia.  The patient was last seen in our office 01/06/2018 for dysphagia, ulcerative colitis, diarrhea.  Noted history of pan ulcerative colitis.  Has been seen before for abdominal cramping and diarrhea and recommended drug holiday from metformin for evaluation.  She did note marked improvement in her bowel movements when off metformin for about 5 weeks.  When she restarted her metformin her diarrhea returned.  At her last visit noted continued call is out 2.25 g twice daily, Bentyl 10 mg 4 times daily with 2-3 bowel movements a day and no further bleeding.  Known triggers.  No nocturnal stools.  Continue dysphasia primarily's pill dysphasia.  Noted history of Schatzki's ring requiring forceps disruption 1 year prior.  Dysphasia returned about 2 months ago.  Has had vomiting of her pills.  No heartburn.  Recommended EGD and follow-up in 4 months.  EGD was completed 02/08/2018 which found obstructing Schatzki's ring status post dilation and disruption, medium sized hiatal hernia, normal duodenum.  Recommended continue current medications, follow-up in 6 months.  Most recent labs available dated 02/27/2018 from [facility].  At that time CBC noted normal hemoglobin at 12.4.  Historical review of labs found normalization of hemoglobin since last episode of anemia noted 2 years ago on 01/22/2016 with a hemoglobin of 11.4.  Today she states she's doing ok overall. She is not on metformin. Still with diarrhea and typically has urgent post-prandial loose stools. Will sometimes have loose stools later in the day. Has anywhere from 1-10 stools a day; greasy foods typically trigger  worsening diarrhea. Has had cholecystectomy in the mid-90s. Didn't have worsening diarrhea after cholecystectomy. Has had some intermittent hematochezia in the setting of hemorrhoids but no recent bleeding. Denies melena. Energy level is "shot" which is chronic for her. Appetite is pretty good. Notes that she first noted diarrhea end of 2017; worsening in 2018.  She is still taking colazal bid and Bentyl qid.  Past Medical History:  Diagnosis Date  . Anemia   . Anginal pain ([place])    PATIENT STATES DOCT THINKS IS FIBROMYALGIA  . Arthritis   . Asthma   . Atrial fibrillation ([place])   . Blood clot in lung last march  . Cancer ([place])    1980  CERVICAL  . Chronic back pain   . COPD (chronic obstructive pulmonary disease) ([place])   . DDD (degenerative disc disease)   . Degenerative tear of posterior horn of lateral meniscus of right knee 01/27/2014  . Depression   . Diabetes mellitus without complication ([place])   . Diverticulitis   . Fibromyalgia   . GERD (gastroesophageal reflux disease)   . Headache    HX MIGRAINES    . Hiatal hernia   . HOH (hard of hearing)    right  . Pancreatitis   . Pneumonia    1 YR  . PONV (postoperative nausea and vomiting)    only happened after knee arthroscopy  . Primary localized osteoarthritis of right knee 12/11/2015  . PVC's (premature ventricular contractions)   . Schatzki's ring   . Seizures ([place])  had febrile seizures as child; none since childhood and on no meds  . Shortness of breath dyspnea    WITH EXERTION   . Sleep apnea    CPAP at night  . Ulcerative colitis 2007  . Wears dentures    full top-partial bottom    Past Surgical History:  Procedure Laterality Date  . arch and foot    . CERVICAL CONE BIOPSY    . CHOLECYSTECTOMY    . COLONOSCOPY  12/03/05   Rourk-marked inflammatory changes of the rectum, left colon, pan colitis, internal hemorrhoids, sigmoid diverticula  . COLONOSCOPY N/A 10/20/2013   Dr. Rourk:Colonic diverticulosis.  colonic mucosal ulcerations involving segment of sigmoid colon, atypical for UC. sigmoid colon path with chronic mildly active colitis consistent with IBD, other biopsies including ascending, transverse, descending colon and rectum unremarkable.   . COLONOSCOPY N/A 11/19/2016   Dr. Gala Romney: Diverticulosis, 7 millimeter polyp removed from the descending colon, internal hemorrhoids, segmental biopsies unremarkable.  Marland Kitchen DILATION AND CURETTAGE OF UTERUS    . ESOPHAGEAL DILATION N/A 09/20/2015   Procedure: ESOPHAGEAL DILATION;  Surgeon: Daneil Dolin, MD;  [facility];  Service: Endoscopy;  Laterality: N/A;  . ESOPHAGOGASTRODUODENOSCOPY  01/28/2011   Rourk- Schatzki ring, s/p 45F, otherwise normal/Hiatal hernia otherwise normal stomach D1 and D2  . ESOPHAGOGASTRODUODENOSCOPY  11/30/08   Rourk-Schatzki's ring status post dilation, hiatal hernia  . ESOPHAGOGASTRODUODENOSCOPY  07/12/2007   Dilation with 56 French, Schatzki's ring  . ESOPHAGOGASTRODUODENOSCOPY N/A 08/10/2014   Dr.Rourk- prominent schatzki's ring s/p maloney dilation and disruption, moderate sized hiatal hernia  . ESOPHAGOGASTRODUODENOSCOPY N/A 09/20/2015   RMR: Schatzki's ring status post dilation as described above. Rather large hiatal hernia; somewhat baggy atonic esophageal body' otherwise normal EGD  . ESOPHAGOGASTRODUODENOSCOPY N/A 11/19/2016   Dr. Gala Romney: moderate Schatzki ring s/p dirsuption with biopsy forceps, large hiatal hernia  . ESOPHAGOGASTRODUODENOSCOPY (EGD) WITH ESOPHAGEAL DILATION  09/01/2012   Dr. Gala Romney- schatzki's ring, hiatal hernia  . ESOPHAGOGASTRODUODENOSCOPY (EGD) WITH PROPOFOL N/A 02/08/2018   Procedure: ESOPHAGOGASTRODUODENOSCOPY (EGD) WITH PROPOFOL;  Surgeon: Daneil Dolin, MD;  [facility];  Service: Endoscopy;  Laterality: N/A;  10:30am  . FOOT ARTHRODESIS  [id number]   both feet  . HERNIA REPAIR     umbilical  . JOINT REPLACEMENT Right   . KNEE ARTHROSCOPY WITH LATERAL MENISECTOMY  Right 01/27/2014   Procedure: RIGHT KNEE ARTHROSCOPY WITH PARTIAL LATERAL MENISECTOMY, ;  Surgeon: Johnny Bridge, MD;  Location: [place];  Service: Orthopedics;  Laterality: Right;  . MALONEY DILATION N/A 08/10/2014   Procedure: [name] DILATION;  Surgeon: Daneil Dolin, MD;  [facility];  Service: Endoscopy;  Laterality: N/A;  [name] DILATION N/A 11/19/2016   Procedure: [name] DILATION;  Surgeon: Daneil Dolin, MD;  [facility];  Service: Endoscopy;  Laterality: N/A;  . [name] DILATION N/A 02/08/2018   Procedure: [name] DILATION;  Surgeon: Daneil Dolin, MD;  [facility];  Service: Endoscopy;  Laterality: N/A;  . SAVORY DILATION N/A 08/10/2014   Procedure: SAVORY DILATION;  Surgeon: Daneil Dolin, MD;  [facility];  Service: Endoscopy;  Laterality: N/A;  . SHOULDER OPEN ROTATOR CUFF REPAIR     BILAT  . TONSILLECTOMY    . TOTAL KNEE ARTHROPLASTY Right 12/11/2015   Procedure: TOTAL KNEE ARTHROPLASTY;  Surgeon: Marchia Bond, MD;  Location: [place];  Service: Orthopedics;  Laterality: Right;    Current Outpatient Medications  Medication Sig  Dispense Refill  . acetaminophen (TYLENOL) 500 MG tablet Take 1,000 mg by mouth every 6 (six) hours as needed for moderate pain or headache.    . ADVAIR DISKUS 250-50 MCG/DOSE AEPB Inhale 1 puff into the lungs 2 (two) times daily.     Marland Kitchen albuterol (PROVENTIL) (2.5 MG/3ML) 0.083% nebulizer solution Take 2.5 mg by nebulization every 6 (six) hours as needed for wheezing or shortness of breath.    Marland Kitchen albuterol (PROVENTIL,VENTOLIN) 90 MCG/ACT inhaler Inhale 2 puffs into the lungs every 6 (six) hours as needed for wheezing or shortness of breath.     Marland Kitchen azelastine (ASTELIN) 137 MCG/SPRAY nasal spray 2 sprays by Nasal route 2 (two) times daily. Use in each nostril as directed     . buPROPion (WELLBUTRIN XL) 150 MG 24 hr tablet Take 150 mg by mouth daily.    . busPIRone (BUSPAR) 5 MG tablet Take  7.5 mg by mouth 2 (two) times daily.    . Calcium Carb-Cholecalciferol (CALCIUM-VITAMIN D) 600-400 MG-UNIT TABS Take 1 tablet by mouth 2 (two) times daily.    . cetirizine (ZYRTEC) 10 MG tablet Take 10 mg by mouth daily.    Marland Kitchen COLAZAL 750 MG capsule TAKE 3 CAPSULES TWICE A DAY 540 capsule 4  . dicyclomine (BENTYL) 10 MG capsule TAKE 1 CAPSULE FOUR TIMES A DAY BEFORE MEALS AND AT BEDTIME 360 capsule 4  . DULoxetine (CYMBALTA) 30 MG capsule Take 90 mg by mouth daily.     Marland Kitchen ELIQUIS 5 MG TABS tablet Take 1 tablet (5 mg total) by mouth 2 (two) times daily. 180 tablet 3  . esomeprazole (NEXIUM) 40 MG capsule TAKE 1 CAPSULE EVERY MORNING 90 capsule 3  . famotidine-calcium carbonate-magnesium hydroxide (PEPCID COMPLETE) 10-800-165 MG CHEW chewable tablet Chew 1 tablet by mouth daily as needed (for breakthrough GERD).     . ferrous sulfate 325 (65 FE) MG tablet Take 325 mg by mouth daily with breakfast.    . fluticasone (VERAMYST) 27.5 MCG/SPRAY nasal spray Place 2 sprays into the nose 2 (two) times daily as needed for rhinitis.    Marland Kitchen ipratropium (ATROVENT) 0.03 % nasal spray Place 2 sprays into both nostrils every 12 (twelve) hours.    Marland Kitchen ketotifen (ALAWAY) 0.025 % ophthalmic solution Place 1 drop into both eyes daily.     Marland Kitchen lidocaine (LIDODERM) 5 % Place 1-2 patches onto the skin daily as needed (pain). Remove & Discard patch within 12 hours or as directed by MD    . losartan (COZAAR) 25 MG tablet Take 12.5 mg by mouth 3 (three) times daily.  0  . methylcellulose (CITRUCEL) oral powder Take 1 packet by mouth daily as needed (constipation).     . metoprolol tartrate (LOPRESSOR) 25 MG tablet Take 0.5 tablets (12.5 mg total) by mouth 2 (two) times daily. 90 tablet 2  . Polyvinyl Alcohol (LUBRICANT DROPS OP) Apply 2 drops to eye 2 (two) times daily.    . pravastatin (PRAVACHOL) 20 MG tablet Take 1 tablet (20 mg total) by mouth daily. 90 tablet 1  . pregabalin (LYRICA) 100 MG capsule Take 100 mg by mouth 2 (two)  times daily.    . Probiotic CAPS Take 1 capsule by mouth daily.    Marland Kitchen tiZANidine (ZANAFLEX) 4 MG tablet Take 4 mg by mouth 3 (three) times daily as needed for muscle spasms.   1  . traMADol (ULTRAM-ER) 100 MG 24 hr tablet Take 100 mg by mouth at bedtime. 1 hour before sleep    .  traZODone (DESYREL) 100 MG tablet Take 100 mg by mouth at bedtime.       No current facility-administered medications for this visit.     Allergies as of 08/11/2018 - Review Complete 08/11/2018  Allergen Reaction Noted  . Mesalamine Other (See Comments)   . Cefprozil Other (See Comments)   . Cefuroxime axetil Other (See Comments) 08/08/2008  . Morphine and related Hives and Itching 08/23/2012  . Penicillins Hives 08/08/2008  . Aspirin Other (See Comments)   . Chicken allergy Nausea And Vomiting and Rash 11/27/2015  . Clarithromycin Nausea And Vomiting 08/08/2008  . Codeine Other (See Comments)   . Entex Other (See Comments) 08/08/2008    Family History  Problem Relation Age of Onset  . Heart disease Mother   . Atrial fibrillation Sister   . Arrhythmia Brother   . Colon cancer Neg Hx     Social History   Socioeconomic History  . Marital status: Married    Spouse name: Not on file  . Number of children: 3  . Years of education: Not on file  . Highest education level: Not on file  Occupational History  . 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 Financial resource strain: Not on file  . Food insecurity:    Worry: Not on file    Inability: Not on file  . Transportation needs:    Medical: Not on file    Non-medical: Not on file  Tobacco Use  . Smoking status: Former Smoker    Packs/day: 1.00    Years: 30.00    Pack years: 30.00    Types: Cigarettes    Start date: 07/07/1969    Last attempt to quit: 08/30/2011    Years since quitting: 6.9  . Smokeless tobacco: Never Used  Substance and Sexual Activity  . Alcohol use: No    Alcohol/week: 0.0 standard drinks  . Drug use: No    . Sexual activity: Yes    Partners: Male    Birth control/protection: None    Comment: spouse  Lifestyle  . Physical activity:    Days per week: Not on file    Minutes per session: Not on file  . Stress: Not on file  Relationships  . Social connections:    Talks on phone: Not on file    Gets together: Not on file    Attends religious service: Not on file    Active member of club or organization: Not on file    Attends meetings of clubs or organizations: Not on file    Relationship status: Not on file  Other Topics Concern  . Not on file  Social History Narrative  . Not on file    Review of Systems: General: Negative for anorexia, weight loss, fever, chills, fatigue, weakness. ENT: Negative for hoarseness, difficulty swallowing. CV: Negative for chest pain, angina, palpitations, peripheral edema.  Respiratory: Negative for dyspnea at rest, cough, sputum, wheezing.  GI: See history of present illness. Endo: Negative for unusual weight change.  Heme: Negative for bruising or bleeding.   Physical Exam: BP (!) 143/81   Pulse 68   Temp 98.7 F (37.1 C) (Oral)   Ht 5' 4"  (1.626 m)   Wt 189 lb 3.2 oz (85.8 kg)   BMI 32.48 kg/m  General:   Alert and oriented. Pleasant and cooperative. Well-nourished and well-developed.  Eyes:  Without icterus, sclera clear and conjunctiva pink.  Ears:  Normal auditory acuity. Cardiovascular:  S1, S2 present  without murmurs appreciated. Extremities without clubbing or edema. Respiratory:  Clear to auscultation bilaterally. No wheezes, rales, or rhonchi. No distress.  Gastrointestinal:  +BS, soft, non-tender and non-distended. No HSM noted. No guarding or rebound. No masses appreciated.  Rectal:  Deferred  Musculoskalatal:  Symmetrical without gross deformities. Neurologic:  Alert and oriented x4;  grossly normal neurologically. Psych:  Alert and cooperative. Normal mood and affect. Heme/Lymph/Immune: No excessive bruising  noted.    08/11/2018 10:22 AM   Disclaimer: This note was dictated with voice recognition software. Similar sounding words can inadvertently be transcribed and may not be corrected upon review.

## 2018-08-11 NOTE — Assessment & Plan Note (Signed)
She does have a history of pan ulcerative colitis which was diagnosed on colonoscopy in 2015.  She is generally been asymptomatic related to this.  However, given increasing diarrhea I will check ESR and CRP.  Return for follow-up in 2 to 3 months.  Continue current medications including Colazal. 

## 2018-08-11 NOTE — Progress Notes (Signed)
CC'ED TO PCP 

## 2018-08-12 DIAGNOSIS — D509 Iron deficiency anemia, unspecified: Secondary | ICD-10-CM | POA: Diagnosis not present

## 2018-08-12 DIAGNOSIS — K5100 Ulcerative (chronic) pancolitis without complications: Secondary | ICD-10-CM | POA: Diagnosis not present

## 2018-08-12 DIAGNOSIS — R197 Diarrhea, unspecified: Secondary | ICD-10-CM | POA: Diagnosis not present

## 2018-08-16 LAB — CBC WITH DIFFERENTIAL/PLATELET
Basophils Absolute: 113 {cells}/uL (ref 0–200)
Basophils Relative: 0.9 %
Eosinophils Absolute: 315 {cells}/uL (ref 15–500)
Eosinophils Relative: 2.5 %
HCT: 40 % (ref 35.0–45.0)
Hemoglobin: 13.6 g/dL (ref 11.7–15.5)
Lymphs Abs: 2495 {cells}/uL (ref 850–3900)
MCH: 30.8 pg (ref 27.0–33.0)
MCHC: 34 g/dL (ref 32.0–36.0)
MCV: 90.7 fL (ref 80.0–100.0)
MPV: 12.1 fL (ref 7.5–12.5)
Monocytes Relative: 10.3 %
Neutro Abs: 8379 {cells}/uL — ABNORMAL HIGH (ref 1500–7800)
Neutrophils Relative %: 66.5 %
Platelets: 258 Thousand/uL (ref 140–400)
RBC: 4.41 Million/uL (ref 3.80–5.10)
RDW: 12.9 % (ref 11.0–15.0)
Total Lymphocyte: 19.8 %
WBC mixed population: 1298 {cells}/uL — ABNORMAL HIGH (ref 200–950)
WBC: 12.6 Thousand/uL — ABNORMAL HIGH (ref 3.8–10.8)

## 2018-08-16 LAB — GASTROINTESTINAL PATHOGEN PANEL PCR
C. difficile Tox A/B, PCR: NOT DETECTED
Campylobacter, PCR: NOT DETECTED
Cryptosporidium, PCR: NOT DETECTED
E coli (ETEC) LT/ST PCR: NOT DETECTED
E coli (STEC) stx1/stx2, PCR: NOT DETECTED
E coli 0157, PCR: NOT DETECTED
Giardia lamblia, PCR: NOT DETECTED
Norovirus, PCR: NOT DETECTED
Rotavirus A, PCR: NOT DETECTED
Salmonella, PCR: NOT DETECTED
Shigella, PCR: NOT DETECTED

## 2018-08-16 LAB — IRON: Iron: 157 ug/dL (ref 45–160)

## 2018-08-16 LAB — SEDIMENTATION RATE: Sed Rate: 9 mm/h (ref 0–30)

## 2018-08-16 LAB — FERRITIN: Ferritin: 17 ng/mL (ref 16–288)

## 2018-08-16 LAB — C-REACTIVE PROTEIN: CRP: 1.8 mg/L (ref <8.0)

## 2018-09-02 DIAGNOSIS — Z6832 Body mass index (BMI) 32.0-32.9, adult: Secondary | ICD-10-CM | POA: Diagnosis not present

## 2018-09-02 DIAGNOSIS — J0101 Acute recurrent maxillary sinusitis: Secondary | ICD-10-CM | POA: Diagnosis not present

## 2018-09-08 DIAGNOSIS — E7800 Pure hypercholesterolemia, unspecified: Secondary | ICD-10-CM | POA: Diagnosis not present

## 2018-09-08 DIAGNOSIS — E782 Mixed hyperlipidemia: Secondary | ICD-10-CM | POA: Diagnosis not present

## 2018-09-08 DIAGNOSIS — K219 Gastro-esophageal reflux disease without esophagitis: Secondary | ICD-10-CM | POA: Diagnosis not present

## 2018-09-08 DIAGNOSIS — N189 Chronic kidney disease, unspecified: Secondary | ICD-10-CM | POA: Diagnosis not present

## 2018-09-08 DIAGNOSIS — E7801 Familial hypercholesterolemia: Secondary | ICD-10-CM | POA: Diagnosis not present

## 2018-09-08 DIAGNOSIS — E1165 Type 2 diabetes mellitus with hyperglycemia: Secondary | ICD-10-CM | POA: Diagnosis not present

## 2018-09-08 DIAGNOSIS — Z7901 Long term (current) use of anticoagulants: Secondary | ICD-10-CM | POA: Diagnosis not present

## 2018-09-15 DIAGNOSIS — E119 Type 2 diabetes mellitus without complications: Secondary | ICD-10-CM | POA: Diagnosis not present

## 2018-09-15 DIAGNOSIS — K5190 Ulcerative colitis, unspecified, without complications: Secondary | ICD-10-CM | POA: Diagnosis not present

## 2018-09-15 DIAGNOSIS — Z6833 Body mass index (BMI) 33.0-33.9, adult: Secondary | ICD-10-CM | POA: Diagnosis not present

## 2018-09-15 DIAGNOSIS — K219 Gastro-esophageal reflux disease without esophagitis: Secondary | ICD-10-CM | POA: Diagnosis not present

## 2018-09-15 DIAGNOSIS — G4733 Obstructive sleep apnea (adult) (pediatric): Secondary | ICD-10-CM | POA: Diagnosis not present

## 2018-09-15 DIAGNOSIS — F329 Major depressive disorder, single episode, unspecified: Secondary | ICD-10-CM | POA: Diagnosis not present

## 2018-09-15 DIAGNOSIS — J4520 Mild intermittent asthma, uncomplicated: Secondary | ICD-10-CM | POA: Diagnosis not present

## 2018-09-15 DIAGNOSIS — J309 Allergic rhinitis, unspecified: Secondary | ICD-10-CM | POA: Diagnosis not present

## 2018-10-19 DIAGNOSIS — J0101 Acute recurrent maxillary sinusitis: Secondary | ICD-10-CM | POA: Diagnosis not present

## 2018-10-19 DIAGNOSIS — Z6834 Body mass index (BMI) 34.0-34.9, adult: Secondary | ICD-10-CM | POA: Diagnosis not present

## 2018-11-08 ENCOUNTER — Encounter: Payer: Self-pay | Admitting: Nurse Practitioner

## 2018-11-08 ENCOUNTER — Encounter: Payer: Self-pay | Admitting: Internal Medicine

## 2018-11-08 ENCOUNTER — Ambulatory Visit (INDEPENDENT_AMBULATORY_CARE_PROVIDER_SITE_OTHER): Payer: Medicare Other | Admitting: Nurse Practitioner

## 2018-11-08 ENCOUNTER — Telehealth: Payer: Self-pay

## 2018-11-08 VITALS — BP 135/69 | HR 60 | Temp 97.7°F | Ht 64.0 in | Wt 200.6 lb

## 2018-11-08 DIAGNOSIS — R1310 Dysphagia, unspecified: Secondary | ICD-10-CM

## 2018-11-08 DIAGNOSIS — K5100 Ulcerative (chronic) pancolitis without complications: Secondary | ICD-10-CM | POA: Diagnosis not present

## 2018-11-08 DIAGNOSIS — R197 Diarrhea, unspecified: Secondary | ICD-10-CM | POA: Diagnosis not present

## 2018-11-08 DIAGNOSIS — R1319 Other dysphagia: Secondary | ICD-10-CM

## 2018-11-08 NOTE — Assessment & Plan Note (Signed)
Diarrhea significantly improved.  Only having 1 or 2 stools about once a week.  Recommend she continue her current medications including Colazal for ulcerative colitis as well as Bentyl.  Likely component of irritable bowel syndrome superimposed on UC.  Last labs for UC were normal.

## 2018-11-08 NOTE — Telephone Encounter (Signed)
Yes, ok to hold eliquis 48 hrs before and restart day after   J Dharma Pare MD

## 2018-11-08 NOTE — H&P (View-Only) (Signed)
Referring Provider: Riley Lam Primary Care Physician:  Jalene Mullet, PA-C Primary GI:  Dr. Gala Romney  Chief Complaint  Patient presents with  . Diarrhea    off and on  . Dysphagia    HPI:   [name] is a 68 y.o. female who presents for follow-up on diarrhea and iron deficiency anemia.  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  Noted history of pan ulcerative colitis.  Previously had improvement in diarrhea with a drug holiday from metformin.  History of Schatzki's ring requiring forceps disruption.  Repeat EGD May 2019 again found obstructing Schatzki's ring status post dilation and disruption.  Most recent labs dated 02/27/2018 from [facility] with a normal hemoglobin at 12.4.  At her last visit she remained off metformin, still with diarrhea and typically has urgent postprandial loose stools.  Anywhere from 1-10 stools a day, greasy foods trigger worsening symptoms.  Status post cholecystectomy but no postoperative diarrhea.  Intermittent hematochezia in the setting of known hemorrhoids but no recent bleeding.  Poor energy level but good appetite.  She is still on Colazal and Bentyl qid.  Recommended follow-up labs, stool studies, probiotics for 1 to 2 months, follow-up in 2 to 3 months.  Labs completed 08/12/2018 found mild leukocytosis with white blood cell count of 12.6, normal hemoglobin at 13.6, normal iron and ferritin, normal sed rate and CRP.  GI pathogen panel was negative.  Given labs the patient was informed that she most likely has irritable bowel syndrome and recommended continue Bentyl and she can try Imodium as well.  Call with any worsening of symptoms.  Today she states she's doing ok overall. Found out yesterday her father-in-law passed away. Her diarrhea is doing better, not having diarrhea every day. Having symptoms 2-3 times in a week. Denies hematochezia, melena. Having dysphagia  which started about 3-4 weeks ago; this is a recurrence. Last EGD 02/08/2018 with obstructing ring s/p disruption. Solid food dysphagia occurs "at least" once a day, occasional pill dysphagia. Has occasional regurgitation. Denies N/V. Has rare GERD symptoms after a dietary indiscretion. Denies abdominal pain, fever, chills, unintentional weight loss. Denies chest pain, dyspnea, dizziness, lightheadedness, syncope, near syncope. Denies any other upper or lower GI symptoms.  Past Medical History:  Diagnosis Date  . Anemia   . Anginal pain ([place])    PATIENT STATES DOCT THINKS IS FIBROMYALGIA  . Arthritis   . Asthma   . Atrial fibrillation ([place])   . Blood clot in lung last march  . 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 Chronic back pain   . COPD (chronic obstructive pulmonary disease) ([place])   . DDD (degenerative disc disease)   . Degenerative tear of posterior horn of lateral meniscus of right knee 01/27/2014  . Depression   . Diabetes mellitus without complication ([place])   . Diverticulitis   . Fibromyalgia   . GERD (gastroesophageal reflux disease)   . Headache    HX MIGRAINES    . Hiatal hernia   . HOH (hard of hearing)    right  . Pancreatitis   . Pneumonia    1 YR  . PONV (postoperative nausea and vomiting)    only happened after knee arthroscopy  . Primary localized osteoarthritis of right knee 12/11/2015  . PVC's (premature ventricular contractions)   . Schatzki's ring   . Seizures ([place])    had  febrile seizures as child; none since childhood and on no meds  . Shortness of breath dyspnea    WITH EXERTION   . Sleep apnea    CPAP at night  . Ulcerative colitis 2007  . Wears dentures    full top-partial bottom    Past Surgical History:  Procedure Laterality Date  . arch and foot    . CERVICAL CONE BIOPSY    . CHOLECYSTECTOMY    . COLONOSCOPY  12/03/05   Rourk-marked inflammatory changes of the rectum, left colon, pan colitis, internal hemorrhoids, sigmoid diverticula  .  COLONOSCOPY N/A 10/20/2013   Dr. Rourk:Colonic diverticulosis. colonic mucosal ulcerations involving segment of sigmoid colon, atypical for UC. sigmoid colon path with chronic mildly active colitis consistent with IBD, other biopsies including ascending, transverse, descending colon and rectum unremarkable.   . COLONOSCOPY N/A 11/19/2016   Dr. Gala Romney: Diverticulosis, 7 millimeter polyp removed from the descending colon, internal hemorrhoids, segmental biopsies unremarkable.  Marland Kitchen DILATION AND CURETTAGE OF UTERUS    . ESOPHAGEAL DILATION N/A 09/20/2015   Procedure: ESOPHAGEAL DILATION;  Surgeon: Daneil Dolin, MD;  [facility];  Service: Endoscopy;  Laterality: N/A;  . ESOPHAGOGASTRODUODENOSCOPY  01/28/2011   Rourk- Schatzki ring, s/p 76F, otherwise normal/Hiatal hernia otherwise normal stomach D1 and D2  . ESOPHAGOGASTRODUODENOSCOPY  11/30/08   Rourk-Schatzki's ring status post dilation, hiatal hernia  . ESOPHAGOGASTRODUODENOSCOPY  07/12/2007   Dilation with 56 French, Schatzki's ring  . ESOPHAGOGASTRODUODENOSCOPY N/A 08/10/2014   Dr.Rourk- prominent schatzki's ring s/p maloney dilation and disruption, moderate sized hiatal hernia  . ESOPHAGOGASTRODUODENOSCOPY N/A 09/20/2015   RMR: Schatzki's ring status post dilation as described above. Rather large hiatal hernia; somewhat baggy atonic esophageal body' otherwise normal EGD  . ESOPHAGOGASTRODUODENOSCOPY N/A 11/19/2016   Dr. Gala Romney: moderate Schatzki ring s/p dirsuption with biopsy forceps, large hiatal hernia  . ESOPHAGOGASTRODUODENOSCOPY (EGD) WITH ESOPHAGEAL DILATION  09/01/2012   Dr. Gala Romney- schatzki's ring, hiatal hernia  . ESOPHAGOGASTRODUODENOSCOPY (EGD) WITH PROPOFOL N/A 02/08/2018   Procedure: ESOPHAGOGASTRODUODENOSCOPY (EGD) WITH PROPOFOL;  Surgeon: Daneil Dolin, MD;  [facility];  Service: Endoscopy;  Laterality: N/A;  10:30am  . FOOT ARTHRODESIS  [id number]   both feet  . HERNIA REPAIR     umbilical  . JOINT  REPLACEMENT Right   . KNEE ARTHROSCOPY WITH LATERAL MENISECTOMY Right 01/27/2014   Procedure: RIGHT KNEE ARTHROSCOPY WITH PARTIAL LATERAL MENISECTOMY, ;  Surgeon: Johnny Bridge, MD;  Location: [place];  Service: Orthopedics;  Laterality: Right;  . MALONEY DILATION N/A 08/10/2014   Procedure: [name] DILATION;  Surgeon: Daneil Dolin, MD;  [facility];  Service: Endoscopy;  Laterality: N/A;  [name] DILATION N/A 11/19/2016   Procedure: [name] DILATION;  Surgeon: Daneil Dolin, MD;  [facility];  Service: Endoscopy;  Laterality: N/A;  . [name] DILATION N/A 02/08/2018   Procedure: [name] DILATION;  Surgeon: Daneil Dolin, MD;  [facility];  Service: Endoscopy;  Laterality: N/A;  . SAVORY DILATION N/A 08/10/2014   Procedure: SAVORY DILATION;  Surgeon: Daneil Dolin, MD;  [facility];  Service: Endoscopy;  Laterality: N/A;  . SHOULDER OPEN ROTATOR CUFF REPAIR     BILAT  . TONSILLECTOMY    . TOTAL KNEE ARTHROPLASTY Right 12/11/2015   Procedure: TOTAL KNEE ARTHROPLASTY;  Surgeon: Marchia Bond, MD;  Location: [place];  Service: Orthopedics;  Laterality: Right;    Current Outpatient Medications  Medication Sig Dispense  Refill  . acetaminophen (TYLENOL) 500 MG tablet Take 1,000 mg by mouth every 6 (six) hours as needed for moderate pain or headache.    . ADVAIR DISKUS 250-50 MCG/DOSE AEPB Inhale 1 puff into the lungs 2 (two) times daily.     Marland Kitchen albuterol (PROVENTIL) (2.5 MG/3ML) 0.083% nebulizer solution Take 2.5 mg by nebulization every 6 (six) hours as needed for wheezing or shortness of breath.    Marland Kitchen albuterol (PROVENTIL,VENTOLIN) 90 MCG/ACT inhaler Inhale 2 puffs into the lungs every 6 (six) hours as needed for wheezing or shortness of breath.     Marland Kitchen azelastine (ASTELIN) 137 MCG/SPRAY nasal spray 2 sprays by Nasal route 2 (two) times daily. Use in each nostril as directed     . buPROPion (WELLBUTRIN XL) 150 MG 24 hr tablet Take  150 mg by mouth daily.    . busPIRone (BUSPAR) 5 MG tablet Take 7.5 mg by mouth 2 (two) times daily.    . Calcium Carb-Cholecalciferol (CALCIUM-VITAMIN D) 600-400 MG-UNIT TABS Take 1 tablet by mouth 2 (two) times daily.    . cetirizine (ZYRTEC) 10 MG tablet Take 10 mg by mouth daily.    Marland Kitchen COLAZAL 750 MG capsule TAKE 3 CAPSULES TWICE A DAY 540 capsule 4  . dicyclomine (BENTYL) 10 MG capsule TAKE 1 CAPSULE FOUR TIMES A DAY BEFORE MEALS AND AT BEDTIME 360 capsule 4  . DULoxetine (CYMBALTA) 30 MG capsule Take 90 mg by mouth daily.     Marland Kitchen ELIQUIS 5 MG TABS tablet Take 1 tablet (5 mg total) by mouth 2 (two) times daily. 180 tablet 3  . esomeprazole (NEXIUM) 40 MG capsule TAKE 1 CAPSULE EVERY MORNING 90 capsule 3  . famotidine-calcium carbonate-magnesium hydroxide (PEPCID COMPLETE) 10-800-165 MG CHEW chewable tablet Chew 1 tablet by mouth daily as needed (for breakthrough GERD).     . ferrous sulfate 325 (65 FE) MG tablet Take 325 mg by mouth daily with breakfast.    . fluticasone (VERAMYST) 27.5 MCG/SPRAY nasal spray Place 2 sprays into the nose 2 (two) times daily as needed for rhinitis.    Marland Kitchen ipratropium (ATROVENT) 0.03 % nasal spray Place 2 sprays into both nostrils every 12 (twelve) hours.    Marland Kitchen ketotifen (ALAWAY) 0.025 % ophthalmic solution Place 1 drop into both eyes daily.     Marland Kitchen lidocaine (LIDODERM) 5 % Place 1-2 patches onto the skin daily as needed (pain). Remove & Discard patch within 12 hours or as directed by MD    . losartan (COZAAR) 25 MG tablet Take 12.5 mg by mouth 3 (three) times daily.  0  . methylcellulose (CITRUCEL) oral powder Take 1 packet by mouth daily as needed (constipation).     . metoprolol tartrate (LOPRESSOR) 25 MG tablet Take 0.5 tablets (12.5 mg total) by mouth 2 (two) times daily. 90 tablet 2  . Polyvinyl Alcohol (LUBRICANT DROPS OP) Apply 2 drops to eye 2 (two) times daily.    . pravastatin (PRAVACHOL) 20 MG tablet Take 1 tablet (20 mg total) by mouth daily. 90 tablet 1    . pregabalin (LYRICA) 100 MG capsule Take 100 mg by mouth 2 (two) times daily.    . Probiotic CAPS Take 1 capsule by mouth daily.    . traZODone (DESYREL) 100 MG tablet Take 100 mg by mouth at bedtime.       No current facility-administered medications for this visit.     Allergies as of 11/08/2018 - Review Complete 11/08/2018  Allergen Reaction Noted  .  Mesalamine Other (See Comments)   . Cefprozil Other (See Comments)   . Cefuroxime axetil Other (See Comments) 08/08/2008  . Morphine and related Hives and Itching 08/23/2012  . Penicillins Hives 08/08/2008  . Aspirin Other (See Comments)   . Chicken allergy Nausea And Vomiting and Rash 11/27/2015  . Clarithromycin Nausea And Vomiting 08/08/2008  . Codeine Other (See Comments)   . Entex Other (See Comments) 08/08/2008    Family History  Problem Relation Age of Onset  . Heart disease Mother   . Atrial fibrillation Sister   . Arrhythmia Brother   . Colon cancer Neg Hx     Social History   Socioeconomic History  . Marital status: Married    Spouse name: Not on file  . Number of children: 3  . Years of education: Not on file  . Highest education level: Not on file  Occupational History  . 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 Financial resource strain: Not on file  . Food insecurity:    Worry: Not on file    Inability: Not on file  . Transportation needs:    Medical: Not on file    Non-medical: Not on file  Tobacco Use  . Smoking status: Former Smoker    Packs/day: 1.00    Years: 30.00    Pack years: 30.00    Types: Cigarettes    Start date: 07/07/1969    Last attempt to quit: 08/30/2011    Years since quitting: 7.1  . Smokeless tobacco: Never Used  Substance and Sexual Activity  . Alcohol use: No    Alcohol/week: 0.0 standard drinks  . Drug use: No  . Sexual activity: Yes    Partners: Male    Birth control/protection: None    Comment: spouse  Lifestyle  . Physical activity:    Days  per week: Not on file    Minutes per session: Not on file  . Stress: Not on file  Relationships  . Social connections:    Talks on phone: Not on file    Gets together: Not on file    Attends religious service: Not on file    Active member of club or organization: Not on file    Attends meetings of clubs or organizations: Not on file    Relationship status: Not on file  Other Topics Concern  . Not on file  Social History Narrative  . Not on file    Review of Systems: General: Negative for anorexia, weight loss, fever, chills, fatigue, weakness. ENT: Negative for hoarseness. Solid food and pill dysphagia per HPI. CV: Negative for chest pain, angina, palpitations, peripheral edema.  Respiratory: Negative for dyspnea at rest, cough, sputum, wheezing.  GI: See history of present illness. Endo: Negative for unusual weight change.  Heme: Negative for bruising or bleeding. Allergy: Negative for rash or hives.   Physical Exam: BP 135/69   Pulse 60   Temp 97.7 F (36.5 C) (Oral)   Ht 5' 4"  (1.626 m)   Wt 200 lb 9.6 oz (91 kg)   BMI 34.43 kg/m  General:   Alert and oriented. Pleasant and cooperative. Well-nourished and well-developed.  Eyes:  Without icterus, sclera clear and conjunctiva pink.  Ears:  Normal auditory acuity. Cardiovascular:  S1, S2 present without murmurs appreciated. Extremities without clubbing or edema. Respiratory:  Clear to auscultation bilaterally. No wheezes, rales, or rhonchi. No distress.  Gastrointestinal:  +BS, soft, non-tender and non-distended. No HSM noted. No guarding  or rebound. No masses appreciated.  Rectal:  Deferred  Musculoskalatal:  Symmetrical without gross deformities. Neurologic:  Alert and oriented x4;  grossly normal neurologically. Psych:  Alert and cooperative. Normal mood and affect. Heme/Lymph/Immune: No excessive bruising noted.    11/08/2018 8:50 AM   Disclaimer: This note was dictated with voice recognition software. Similar  sounding words can inadvertently be transcribed and may not be corrected upon review.

## 2018-11-08 NOTE — Assessment & Plan Note (Addendum)
Having recurrent solid food and pill dysphagia.  Symptoms occur "at least" once a day.  She is previously had obstructing Schatzki's ring.  Last EGD May 2019 status post ring disruption.  She is currently on a PPI.  Recommend she continue this and we will proceed with scheduling of upper endoscopy.  Proceed with EGD +/- dilation on propofol/MAC with Dr. Gala Romney in near future: the risks, benefits, and alternatives have been discussed with the patient in detail. The patient states understanding and desires to proceed.  The patient is currently on Eliquis (atrial fibrillation and history of pulmonary embolism in 2017).  Also on Wellbutrin, Cymbalta, Lyrica, trazodone.  No other anticoagulants, anxiolytics, chronic pain medications, or antidepressants.  We will reach out to cardiology who manages her Eliquis for okay to hold 48 hours prior to procedure.  We will plan for the procedure on propofol/MAC to promote adequate sedation.

## 2018-11-08 NOTE — Telephone Encounter (Signed)
LMOVM

## 2018-11-08 NOTE — Patient Instructions (Signed)
Your health issues we discussed today were:   Ulcerative colitis: 1. Continue your current medications 2. Let us know if you have any worsening symptoms  Swallowing problems (dysphagia): 1. Continue taking Nexium 2. We will call Dr. Harl Bowie for okay to hold your Eliquis for 48 hours prior to endoscopy 3. We will schedule your endoscopy for you 4. Further recommendations to be made after endoscopy  Diarrhea: 1. I am glad your symptoms are improved 2. Continue current medications  Overall I recommend:  1. Follow-up in 4 months 2. Call us if you have any questions or concerns.  At [facility] we value your feedback. You may receive a survey about your visit today. Please share your experience as we strive to create trusting relationships with our patients to provide genuine, compassionate, quality care.  We appreciate your understanding and patience as we review any laboratory studies, imaging, and other diagnostic tests that are ordered as we care for you. Our office policy is 5 business days for review of these results, and any emergent or urgent results are addressed in a timely manner for your best interest. If you do not hear from our office in 1 week, please contact us.   We also encourage the use of MyChart, which contains your medical information for your review as well. If you are not enrolled in this feature, an access code is on this after visit summary for your convenience. Thank you for allowing [place] to be involved in your care.  It was great to see you today!  I hope you have a great day!!

## 2018-11-08 NOTE — Telephone Encounter (Signed)
Dr. Harl Bowie, Pt was seen in our office today. She is due for and upper endoscopy with Dr. Gala Romney. Is it ok for her to hold Eliquis x 48 hours prior to procedure? Please advise.

## 2018-11-08 NOTE — Progress Notes (Signed)
Referring Provider: Riley Lam Primary Care Physician:  Jalene Mullet, PA-C Primary GI:  Dr. Gala Romney  Chief Complaint  Patient presents with  . Diarrhea    off and on  . Dysphagia    HPI:   [name] is a 67 y.o. female who presents for follow-up on diarrhea and iron deficiency anemia.  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  Noted history of pan ulcerative colitis.  Previously had improvement in diarrhea with a drug holiday from metformin.  History of Schatzki's ring requiring forceps disruption.  Repeat EGD May 2019 again found obstructing Schatzki's ring status post dilation and disruption.  Most recent labs dated 02/27/2018 from [facility] with a normal hemoglobin at 12.4.  At her last visit she remained off metformin, still with diarrhea and typically has urgent postprandial loose stools.  Anywhere from 1-10 stools a day, greasy foods trigger worsening symptoms.  Status post cholecystectomy but no postoperative diarrhea.  Intermittent hematochezia in the setting of known hemorrhoids but no recent bleeding.  Poor energy level but good appetite.  She is still on Colazal and Bentyl qid.  Recommended follow-up labs, stool studies, probiotics for 1 to 2 months, follow-up in 2 to 3 months.  Labs completed 08/12/2018 found mild leukocytosis with white blood cell count of 12.6, normal hemoglobin at 13.6, normal iron and ferritin, normal sed rate and CRP.  GI pathogen panel was negative.  Given labs the patient was informed that she most likely has irritable bowel syndrome and recommended continue Bentyl and she can try Imodium as well.  Call with any worsening of symptoms.  Today she states she's doing ok overall. Found out yesterday her father-in-law passed away. Her diarrhea is doing better, not having diarrhea every day. Having symptoms 2-3 times in a week. Denies hematochezia, melena. Having dysphagia  which started about 3-4 weeks ago; this is a recurrence. Last EGD 02/08/2018 with obstructing ring s/p disruption. Solid food dysphagia occurs "at least" once a day, occasional pill dysphagia. Has occasional regurgitation. Denies N/V. Has rare GERD symptoms after a dietary indiscretion. Denies abdominal pain, fever, chills, unintentional weight loss. Denies chest pain, dyspnea, dizziness, lightheadedness, syncope, near syncope. Denies any other upper or lower GI symptoms.  Past Medical History:  Diagnosis Date  . Anemia   . Anginal pain ([place])    PATIENT STATES DOCT THINKS IS FIBROMYALGIA  . Arthritis   . Asthma   . Atrial fibrillation ([place])   . Blood clot in lung last march  . 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 Chronic back pain   . COPD (chronic obstructive pulmonary disease) ([place])   . DDD (degenerative disc disease)   . Degenerative tear of posterior horn of lateral meniscus of right knee 01/27/2014  . Depression   . Diabetes mellitus without complication ([place])   . Diverticulitis   . Fibromyalgia   . GERD (gastroesophageal reflux disease)   . Headache    HX MIGRAINES    . Hiatal hernia   . HOH (hard of hearing)    right  . Pancreatitis   . Pneumonia    1 YR  . PONV (postoperative nausea and vomiting)    only happened after knee arthroscopy  . Primary localized osteoarthritis of right knee 12/11/2015  . PVC's (premature ventricular contractions)   . Schatzki's ring   . Seizures ([place])    had  febrile seizures as child; none since childhood and on no meds  . Shortness of breath dyspnea    WITH EXERTION   . Sleep apnea    CPAP at night  . Ulcerative colitis 2007  . Wears dentures    full top-partial bottom    Past Surgical History:  Procedure Laterality Date  . arch and foot    . CERVICAL CONE BIOPSY    . CHOLECYSTECTOMY    . COLONOSCOPY  12/03/05   Rourk-marked inflammatory changes of the rectum, left colon, pan colitis, internal hemorrhoids, sigmoid diverticula  .  COLONOSCOPY N/A 10/20/2013   Dr. Rourk:Colonic diverticulosis. colonic mucosal ulcerations involving segment of sigmoid colon, atypical for UC. sigmoid colon path with chronic mildly active colitis consistent with IBD, other biopsies including ascending, transverse, descending colon and rectum unremarkable.   . COLONOSCOPY N/A 11/19/2016   Dr. Gala Romney: Diverticulosis, 7 millimeter polyp removed from the descending colon, internal hemorrhoids, segmental biopsies unremarkable.  Marland Kitchen DILATION AND CURETTAGE OF UTERUS    . ESOPHAGEAL DILATION N/A 09/20/2015   Procedure: ESOPHAGEAL DILATION;  Surgeon: Daneil Dolin, MD;  [facility];  Service: Endoscopy;  Laterality: N/A;  . ESOPHAGOGASTRODUODENOSCOPY  01/28/2011   Rourk- Schatzki ring, s/p 67F, otherwise normal/Hiatal hernia otherwise normal stomach D1 and D2  . ESOPHAGOGASTRODUODENOSCOPY  11/30/08   Rourk-Schatzki's ring status post dilation, hiatal hernia  . ESOPHAGOGASTRODUODENOSCOPY  07/12/2007   Dilation with 56 French, Schatzki's ring  . ESOPHAGOGASTRODUODENOSCOPY N/A 08/10/2014   Dr.Rourk- prominent schatzki's ring s/p maloney dilation and disruption, moderate sized hiatal hernia  . ESOPHAGOGASTRODUODENOSCOPY N/A 09/20/2015   RMR: Schatzki's ring status post dilation as described above. Rather large hiatal hernia; somewhat baggy atonic esophageal body' otherwise normal EGD  . ESOPHAGOGASTRODUODENOSCOPY N/A 11/19/2016   Dr. Gala Romney: moderate Schatzki ring s/p dirsuption with biopsy forceps, large hiatal hernia  . ESOPHAGOGASTRODUODENOSCOPY (EGD) WITH ESOPHAGEAL DILATION  09/01/2012   Dr. Gala Romney- schatzki's ring, hiatal hernia  . ESOPHAGOGASTRODUODENOSCOPY (EGD) WITH PROPOFOL N/A 02/08/2018   Procedure: ESOPHAGOGASTRODUODENOSCOPY (EGD) WITH PROPOFOL;  Surgeon: Daneil Dolin, MD;  [facility];  Service: Endoscopy;  Laterality: N/A;  10:30am  . FOOT ARTHRODESIS  [id number]   both feet  . HERNIA REPAIR     umbilical  . JOINT  REPLACEMENT Right   . KNEE ARTHROSCOPY WITH LATERAL MENISECTOMY Right 01/27/2014   Procedure: RIGHT KNEE ARTHROSCOPY WITH PARTIAL LATERAL MENISECTOMY, ;  Surgeon: Johnny Bridge, MD;  Location: [place];  Service: Orthopedics;  Laterality: Right;  . MALONEY DILATION N/A 08/10/2014   Procedure: [name] DILATION;  Surgeon: Daneil Dolin, MD;  [facility];  Service: Endoscopy;  Laterality: N/A;  [name] DILATION N/A 11/19/2016   Procedure: [name] DILATION;  Surgeon: Daneil Dolin, MD;  [facility];  Service: Endoscopy;  Laterality: N/A;  . [name] DILATION N/A 02/08/2018   Procedure: [name] DILATION;  Surgeon: Daneil Dolin, MD;  [facility];  Service: Endoscopy;  Laterality: N/A;  . SAVORY DILATION N/A 08/10/2014   Procedure: SAVORY DILATION;  Surgeon: Daneil Dolin, MD;  [facility];  Service: Endoscopy;  Laterality: N/A;  . SHOULDER OPEN ROTATOR CUFF REPAIR     BILAT  . TONSILLECTOMY    . TOTAL KNEE ARTHROPLASTY Right 12/11/2015   Procedure: TOTAL KNEE ARTHROPLASTY;  Surgeon: Marchia Bond, MD;  Location: [place];  Service: Orthopedics;  Laterality: Right;    Current Outpatient Medications  Medication Sig Dispense  Refill  . acetaminophen (TYLENOL) 500 MG tablet Take 1,000 mg by mouth every 6 (six) hours as needed for moderate pain or headache.    . ADVAIR DISKUS 250-50 MCG/DOSE AEPB Inhale 1 puff into the lungs 2 (two) times daily.     Marland Kitchen albuterol (PROVENTIL) (2.5 MG/3ML) 0.083% nebulizer solution Take 2.5 mg by nebulization every 6 (six) hours as needed for wheezing or shortness of breath.    Marland Kitchen albuterol (PROVENTIL,VENTOLIN) 90 MCG/ACT inhaler Inhale 2 puffs into the lungs every 6 (six) hours as needed for wheezing or shortness of breath.     Marland Kitchen azelastine (ASTELIN) 137 MCG/SPRAY nasal spray 2 sprays by Nasal route 2 (two) times daily. Use in each nostril as directed     . buPROPion (WELLBUTRIN XL) 150 MG 24 hr tablet Take  150 mg by mouth daily.    . busPIRone (BUSPAR) 5 MG tablet Take 7.5 mg by mouth 2 (two) times daily.    . Calcium Carb-Cholecalciferol (CALCIUM-VITAMIN D) 600-400 MG-UNIT TABS Take 1 tablet by mouth 2 (two) times daily.    . cetirizine (ZYRTEC) 10 MG tablet Take 10 mg by mouth daily.    Marland Kitchen COLAZAL 750 MG capsule TAKE 3 CAPSULES TWICE A DAY 540 capsule 4  . dicyclomine (BENTYL) 10 MG capsule TAKE 1 CAPSULE FOUR TIMES A DAY BEFORE MEALS AND AT BEDTIME 360 capsule 4  . DULoxetine (CYMBALTA) 30 MG capsule Take 90 mg by mouth daily.     Marland Kitchen ELIQUIS 5 MG TABS tablet Take 1 tablet (5 mg total) by mouth 2 (two) times daily. 180 tablet 3  . esomeprazole (NEXIUM) 40 MG capsule TAKE 1 CAPSULE EVERY MORNING 90 capsule 3  . famotidine-calcium carbonate-magnesium hydroxide (PEPCID COMPLETE) 10-800-165 MG CHEW chewable tablet Chew 1 tablet by mouth daily as needed (for breakthrough GERD).     . ferrous sulfate 325 (65 FE) MG tablet Take 325 mg by mouth daily with breakfast.    . fluticasone (VERAMYST) 27.5 MCG/SPRAY nasal spray Place 2 sprays into the nose 2 (two) times daily as needed for rhinitis.    Marland Kitchen ipratropium (ATROVENT) 0.03 % nasal spray Place 2 sprays into both nostrils every 12 (twelve) hours.    Marland Kitchen ketotifen (ALAWAY) 0.025 % ophthalmic solution Place 1 drop into both eyes daily.     Marland Kitchen lidocaine (LIDODERM) 5 % Place 1-2 patches onto the skin daily as needed (pain). Remove & Discard patch within 12 hours or as directed by MD    . losartan (COZAAR) 25 MG tablet Take 12.5 mg by mouth 3 (three) times daily.  0  . methylcellulose (CITRUCEL) oral powder Take 1 packet by mouth daily as needed (constipation).     . metoprolol tartrate (LOPRESSOR) 25 MG tablet Take 0.5 tablets (12.5 mg total) by mouth 2 (two) times daily. 90 tablet 2  . Polyvinyl Alcohol (LUBRICANT DROPS OP) Apply 2 drops to eye 2 (two) times daily.    . pravastatin (PRAVACHOL) 20 MG tablet Take 1 tablet (20 mg total) by mouth daily. 90 tablet 1    . pregabalin (LYRICA) 100 MG capsule Take 100 mg by mouth 2 (two) times daily.    . Probiotic CAPS Take 1 capsule by mouth daily.    . traZODone (DESYREL) 100 MG tablet Take 100 mg by mouth at bedtime.       No current facility-administered medications for this visit.     Allergies as of 11/08/2018 - Review Complete 11/08/2018  Allergen Reaction Noted  .  Mesalamine Other (See Comments)   . Cefprozil Other (See Comments)   . Cefuroxime axetil Other (See Comments) 08/08/2008  . Morphine and related Hives and Itching 08/23/2012  . Penicillins Hives 08/08/2008  . Aspirin Other (See Comments)   . Chicken allergy Nausea And Vomiting and Rash 11/27/2015  . Clarithromycin Nausea And Vomiting 08/08/2008  . Codeine Other (See Comments)   . Entex Other (See Comments) 08/08/2008    Family History  Problem Relation Age of Onset  . Heart disease Mother   . Atrial fibrillation Sister   . Arrhythmia Brother   . Colon cancer Neg Hx     Social History   Socioeconomic History  . Marital status: Married    Spouse name: Not on file  . Number of children: 3  . Years of education: Not on file  . Highest education level: Not on file  Occupational History  . 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 Financial resource strain: Not on file  . Food insecurity:    Worry: Not on file    Inability: Not on file  . Transportation needs:    Medical: Not on file    Non-medical: Not on file  Tobacco Use  . Smoking status: Former Smoker    Packs/day: 1.00    Years: 30.00    Pack years: 30.00    Types: Cigarettes    Start date: 07/07/1969    Last attempt to quit: 08/30/2011    Years since quitting: 7.1  . Smokeless tobacco: Never Used  Substance and Sexual Activity  . Alcohol use: No    Alcohol/week: 0.0 standard drinks  . Drug use: No  . Sexual activity: Yes    Partners: Male    Birth control/protection: None    Comment: spouse  Lifestyle  . Physical activity:    Days  per week: Not on file    Minutes per session: Not on file  . Stress: Not on file  Relationships  . Social connections:    Talks on phone: Not on file    Gets together: Not on file    Attends religious service: Not on file    Active member of club or organization: Not on file    Attends meetings of clubs or organizations: Not on file    Relationship status: Not on file  Other Topics Concern  . Not on file  Social History Narrative  . Not on file    Review of Systems: General: Negative for anorexia, weight loss, fever, chills, fatigue, weakness. ENT: Negative for hoarseness. Solid food and pill dysphagia per HPI. CV: Negative for chest pain, angina, palpitations, peripheral edema.  Respiratory: Negative for dyspnea at rest, cough, sputum, wheezing.  GI: See history of present illness. Endo: Negative for unusual weight change.  Heme: Negative for bruising or bleeding. Allergy: Negative for rash or hives.   Physical Exam: BP 135/69   Pulse 60   Temp 97.7 F (36.5 C) (Oral)   Ht 5' 4"  (1.626 m)   Wt 200 lb 9.6 oz (91 kg)   BMI 34.43 kg/m  General:   Alert and oriented. Pleasant and cooperative. Well-nourished and well-developed.  Eyes:  Without icterus, sclera clear and conjunctiva pink.  Ears:  Normal auditory acuity. Cardiovascular:  S1, S2 present without murmurs appreciated. Extremities without clubbing or edema. Respiratory:  Clear to auscultation bilaterally. No wheezes, rales, or rhonchi. No distress.  Gastrointestinal:  +BS, soft, non-tender and non-distended. No HSM noted. No guarding  or rebound. No masses appreciated.  Rectal:  Deferred  Musculoskalatal:  Symmetrical without gross deformities. Neurologic:  Alert and oriented x4;  grossly normal neurologically. Psych:  Alert and cooperative. Normal mood and affect. Heme/Lymph/Immune: No excessive bruising noted.    11/08/2018 8:50 AM   Disclaimer: This note was dictated with voice recognition software. Similar  sounding words can inadvertently be transcribed and may not be corrected upon review.

## 2018-11-08 NOTE — Telephone Encounter (Signed)
Noted. Routing message.

## 2018-11-08 NOTE — Assessment & Plan Note (Signed)
The patient has ulcerative colitis which is been generally well managed on Colazal and Bentyl.  Previous CRP/ESR normal.  Symptoms remain at bay.  No recent anemia.  At this point recommend she continue current medications, follow-up in 4 months.

## 2018-11-08 NOTE — Progress Notes (Signed)
cc'd to pcp 

## 2018-11-09 ENCOUNTER — Other Ambulatory Visit: Payer: Self-pay | Admitting: *Deleted

## 2018-11-09 DIAGNOSIS — R1310 Dysphagia, unspecified: Secondary | ICD-10-CM

## 2018-11-09 DIAGNOSIS — R1319 Other dysphagia: Secondary | ICD-10-CM

## 2018-11-09 NOTE — Telephone Encounter (Signed)
LMOVM

## 2018-11-09 NOTE — Telephone Encounter (Signed)
Spoke with patient and she is scheduled for 11/17/2018 at 12:00pm. Instructions discussed in detail with pt and I have also mailed instructions. Orders entered.

## 2018-11-11 NOTE — Telephone Encounter (Signed)
Noted, no further recommendations.

## 2018-11-17 ENCOUNTER — Ambulatory Visit (HOSPITAL_COMMUNITY)
Admission: RE | Admit: 2018-11-17 | Discharge: 2018-11-17 | Disposition: A | Discharge status: Home / Self Care | Payer: Medicare Other | Attending: Internal Medicine | Admitting: Internal Medicine

## 2018-11-17 ENCOUNTER — Encounter (HOSPITAL_COMMUNITY): Payer: Self-pay | Admitting: *Deleted

## 2018-11-17 ENCOUNTER — Other Ambulatory Visit: Payer: Self-pay

## 2018-11-17 ENCOUNTER — Encounter (HOSPITAL_COMMUNITY)
Admission: RE | Disposition: A | Discharge status: Home / Self Care | Payer: Self-pay | Source: Home / Self Care | Attending: Internal Medicine

## 2018-11-17 DIAGNOSIS — F329 Major depressive disorder, single episode, unspecified: Secondary | ICD-10-CM | POA: Insufficient documentation

## 2018-11-17 DIAGNOSIS — Z7951 Long term (current) use of inhaled steroids: Secondary | ICD-10-CM | POA: Diagnosis not present

## 2018-11-17 DIAGNOSIS — R1310 Dysphagia, unspecified: Secondary | ICD-10-CM

## 2018-11-17 DIAGNOSIS — G4730 Sleep apnea, unspecified: Secondary | ICD-10-CM | POA: Diagnosis not present

## 2018-11-17 DIAGNOSIS — Z86711 Personal history of pulmonary embolism: Secondary | ICD-10-CM | POA: Insufficient documentation

## 2018-11-17 DIAGNOSIS — Z87891 Personal history of nicotine dependence: Secondary | ICD-10-CM | POA: Diagnosis not present

## 2018-11-17 DIAGNOSIS — D509 Iron deficiency anemia, unspecified: Secondary | ICD-10-CM | POA: Diagnosis not present

## 2018-11-17 DIAGNOSIS — E119 Type 2 diabetes mellitus without complications: Secondary | ICD-10-CM | POA: Insufficient documentation

## 2018-11-17 DIAGNOSIS — K222 Esophageal obstruction: Secondary | ICD-10-CM | POA: Diagnosis not present

## 2018-11-17 DIAGNOSIS — K228 Other specified diseases of esophagus: Secondary | ICD-10-CM | POA: Diagnosis not present

## 2018-11-17 DIAGNOSIS — Z79899 Other long term (current) drug therapy: Secondary | ICD-10-CM | POA: Diagnosis not present

## 2018-11-17 DIAGNOSIS — R1319 Other dysphagia: Secondary | ICD-10-CM

## 2018-11-17 DIAGNOSIS — K449 Diaphragmatic hernia without obstruction or gangrene: Secondary | ICD-10-CM

## 2018-11-17 DIAGNOSIS — I4891 Unspecified atrial fibrillation: Secondary | ICD-10-CM | POA: Diagnosis not present

## 2018-11-17 DIAGNOSIS — Z7901 Long term (current) use of anticoagulants: Secondary | ICD-10-CM | POA: Insufficient documentation

## 2018-11-17 DIAGNOSIS — Z96651 Presence of right artificial knee joint: Secondary | ICD-10-CM | POA: Diagnosis not present

## 2018-11-17 DIAGNOSIS — R197 Diarrhea, unspecified: Secondary | ICD-10-CM | POA: Insufficient documentation

## 2018-11-17 DIAGNOSIS — J449 Chronic obstructive pulmonary disease, unspecified: Secondary | ICD-10-CM | POA: Diagnosis not present

## 2018-11-17 DIAGNOSIS — K219 Gastro-esophageal reflux disease without esophagitis: Secondary | ICD-10-CM | POA: Diagnosis not present

## 2018-11-17 DIAGNOSIS — Z9989 Dependence on other enabling machines and devices: Secondary | ICD-10-CM | POA: Insufficient documentation

## 2018-11-17 DIAGNOSIS — Z8541 Personal history of malignant neoplasm of cervix uteri: Secondary | ICD-10-CM | POA: Diagnosis not present

## 2018-11-17 HISTORY — PX: ESOPHAGOGASTRODUODENOSCOPY: SHX5428

## 2018-11-17 HISTORY — PX: MALONEY DILATION: SHX5535

## 2018-11-17 HISTORY — PX: BALLOON DILATION: SHX5330

## 2018-11-17 LAB — KOH PREP

## 2018-11-17 SURGERY — EGD (ESOPHAGOGASTRODUODENOSCOPY)
Anesthesia: Moderate Sedation

## 2018-11-17 MED ORDER — MEPERIDINE HCL 50 MG/ML IJ SOLN
INTRAMUSCULAR | Status: AC
  Filled 2018-11-17: qty 1

## 2018-11-17 MED ORDER — ONDANSETRON HCL 4 MG/2ML IJ SOLN
INTRAMUSCULAR | Status: AC
  Filled 2018-11-17: qty 2

## 2018-11-17 MED ORDER — MEPERIDINE HCL 100 MG/ML IJ SOLN
INTRAMUSCULAR | Status: DC | PRN
  Administered 2018-11-17: 15 mg via INTRAVENOUS
  Administered 2018-11-17: 25 mg via INTRAVENOUS

## 2018-11-17 MED ORDER — MIDAZOLAM HCL 5 MG/5ML IJ SOLN
INTRAMUSCULAR | Status: AC
  Filled 2018-11-17: qty 10

## 2018-11-17 MED ORDER — SODIUM CHLORIDE 0.9 % IV SOLN
INTRAVENOUS | Status: DC
  Administered 2018-11-17: 07:00:00 via INTRAVENOUS

## 2018-11-17 MED ORDER — ONDANSETRON HCL 4 MG/2ML IJ SOLN
INTRAMUSCULAR | Status: DC | PRN
  Administered 2018-11-17: 4 mg via INTRAVENOUS

## 2018-11-17 MED ORDER — MIDAZOLAM HCL 5 MG/5ML IJ SOLN
INTRAMUSCULAR | Status: DC | PRN
  Administered 2018-11-17 (×2): 1 mg via INTRAVENOUS
  Administered 2018-11-17: 2 mg via INTRAVENOUS
  Administered 2018-11-17: 1 mg via INTRAVENOUS

## 2018-11-17 MED ORDER — LIDOCAINE VISCOUS HCL 2 % MT SOLN
OROMUCOSAL | Status: AC
  Filled 2018-11-17: qty 15

## 2018-11-17 MED ORDER — STERILE WATER FOR IRRIGATION IR SOLN
Status: DC | PRN
  Administered 2018-11-17: 08:00:00

## 2018-11-17 MED ORDER — ESOMEPRAZOLE MAGNESIUM 40 MG PO CPDR: 40.0000 mg | DELAYED_RELEASE_CAPSULE | Freq: Two times a day (BID) | ORAL | 3 refills | Status: DC

## 2018-11-17 NOTE — Interval H&P Note (Signed)
History and Physical Interval Note:  11/17/2018 7:44 AM  [name]  has presented today for surgery, with the diagnosis of Dysphagia  The various methods of treatment have been discussed with the patient and family. After consideration of risks, benefits and other options for treatment, the patient has consented to  Procedure(s) with comments: ESOPHAGOGASTRODUODENOSCOPY (EGD) (N/A) - 12:00PM MALONEY DILATION (N/A) as a surgical intervention .  The patient's history has been reviewed, patient examined, no change in status, stable for surgery.  I have reviewed the patient's chart and labs.  Questions were answered to the patient's satisfaction.     Allisha Harter  No change.  EGD with ED per plan.  The risks, benefits, limitations, alternatives and imponderables have been reviewed with the patient. Potential for esophageal dilation, biopsy, etc. have also been reviewed.  Questions have been answered. All parties agreeable.

## 2018-11-17 NOTE — Op Note (Signed)
[facility] Patient Name: [name] Procedure Date: 11/17/2018 7:22 AM MRN: [id number] Date of Birth: 1951-05-02 Attending MD: Norvel Richards , MD CSN: [id number] Age: 68 Admit Type: Outpatient Procedure:                Upper GI endoscopy Indications:              Dysphagia Providers:                Norvel Richards, MD, Jeanann Lewandowsky. Gwenlyn Perking RN, RN,                            Nelma Rothman, Technician Referring MD:              Medicines:                Midazolam 5 mg IV, Meperidine 40 mg IV, Ondansetron                            4 mg IV Complications:            No immediate complications. Estimated Blood Loss:     Estimated blood loss was minimal. Procedure:                Pre-Anesthesia Assessment:                           - Prior to the procedure, a History and Physical                            was performed, and patient medications and                            allergies were reviewed. The patient's tolerance of                            previous anesthesia was also reviewed. The risks                            and benefits of the procedure and the sedation                            options and risks were discussed with the patient.                            All questions were answered, and informed consent                            was obtained. Prior Anticoagulants: The patient                            last took Eliquis (apixaban) 3 days prior to the                            procedure. ASA Grade Assessment: II - A patient  with mild systemic disease. After reviewing the                            risks and benefits, the patient was deemed in                            satisfactory condition to undergo the procedure.                           After obtaining informed consent, the endoscope was                            passed under direct vision. Throughout the                            procedure, the patient's blood  pressure, pulse, and                            oxygen saturations were monitored continuously. The                            GIF-H190 ([id number]) scope was introduced through the                            mouth, and advanced to the second part of duodenum.                            The upper GI endoscopy was accomplished without                            difficulty. The patient tolerated the procedure                            well. Scope In: 8:01:26 AM Scope Out: 8:19:49 AM Total Procedure Duration: 0 hours 18 minutes 23 seconds  Findings:      Cream-colored plaques in the proximal esophagus of uncertain       significance. A moderate Schatzki ring was found at the gastroesophageal       junction. Ring had a prominent rim. No tumor. No esophagitis. Attempted       to pass a 28 [place] Maloney dilator acute resistance encountered at       three quarters insertion. I pulled back and went back with the scope. I       attempted to take a couple of 2 quadrant bites of the ring. This was not       very productive due to difficult approach. Finally, obtain a TTS 20 mm       balloon placed across the ring and inflated this was associated with       some immediate disruption of the ring. The balloon was taken down and       removed. There was dilation of the ring without apparent complication.       Minimal bleeding. See photos. Lastly, I brushed the esophageal mucosa       for KOH prep.      A large hiatal hernia was  present.      The exam was otherwise without abnormality.      The duodenal bulb and second portion of the duodenum were normal. Impression:               - Moderate Schatzki ring. Disrupted and dilated as                            described above. Esophageal plaques of uncertain                            significance?"status post brushing.                           - Large hiatal hernia.                           - The examination was otherwise normal.                            - Normal duodenal bulb and second portion of the                            duodenum.                           - No specimens collected. Moderate Sedation:      Moderate (conscious) sedation was administered by the endoscopy nurse       and supervised by the endoscopist. The following parameters were       monitored: oxygen saturation, heart rate, blood pressure, respiratory       rate, EKG, adequacy of pulmonary ventilation, and response to care.       Total physician intraservice time was 32 minutes. Recommendation:           - Patient has a contact number available for                            emergencies. The signs and symptoms of potential                            delayed complications were discussed with the                            patient. Return to normal activities tomorrow.                            Written discharge instructions were provided to the                            patient.                           - Mechanical soft diet. Increase Nexium to 40 mg                            twice daily. Follow-up on KOH. Office visit in 30  months. Resume Eliquis tomorrow Procedure Code(s):        --- Professional ---                           [phone], Esophagogastroduodenoscopy, flexible,                            transoral; diagnostic, including collection of                            specimen(s) by brushing or washing, when performed                            (separate procedure)                           99153, Moderate sedation; each additional 15                            minutes intraservice time                           [id number], Moderate sedation services provided by the                            same physician or other qualified health care                            professional performing a gastrointestinal                            endoscopic service that sedation supports,                            requiring the presence of an  independent trained                            observer to assist in the monitoring of the                            patient's level of consciousness and physiological                            status; initial 15 minutes of intra-service time;                            patient age 80 years or older (additional time may                            be reported with [phone], as appropriate) Diagnosis Code(s):        --- Professional ---                           K22.2, Esophageal obstruction  K44.9, Diaphragmatic hernia without obstruction or                            gangrene                           R13.10, Dysphagia, unspecified CPT copyright 2018 American Medical Association. All rights reserved. The codes documented in this report are preliminary and upon coder review may  be revised to meet current compliance requirements. [name]. Herberth Deharo, MD Norvel Richards, MD 11/17/2018 8:47:46 AM This report has been signed electronically. Number of Addenda: 0

## 2018-11-17 NOTE — Discharge Instructions (Signed)
EGD Discharge instructions Please read the instructions outlined below and refer to this sheet in the next few weeks. These discharge instructions provide you with general information on caring for yourself after you leave the hospital. Your doctor may also give you specific instructions. While your treatment has been planned according to the most current medical practices available, unavoidable complications occasionally occur. If you have any problems or questions after discharge, please call your doctor. ACTIVITY  You may resume your regular activity but move at a slower pace for the next 24 hours.   Take frequent rest periods for the next 24 hours.   Walking will help expel (get rid of) the air and reduce the bloated feeling in your abdomen.   No driving for 24 hours (because of the anesthesia (medicine) used during the test).   You may shower.   Do not sign any important legal documents or operate any machinery for 24 hours (because of the anesthesia used during the test).  NUTRITION  Drink plenty of fluids.   You may resume your normal diet.   Begin with a light meal and progress to your normal diet.   Avoid alcoholic beverages for 24 hours or as instructed by your caregiver.  MEDICATIONS  You may resume your normal medications unless your caregiver tells you otherwise.  WHAT YOU CAN EXPECT TODAY  You may experience abdominal discomfort such as a feeling of fullness or gas pains.  FOLLOW-UP  Your doctor will discuss the results of your test with you.  SEEK IMMEDIATE MEDICAL ATTENTION IF ANY OF THE FOLLOWING OCCUR:  Excessive nausea (feeling sick to your stomach) and/or vomiting.   Severe abdominal pain and distention (swelling).   Trouble swallowing.   Temperature over 101 F (37.8 C).   Rectal bleeding or vomiting of blood.    GERD information provided  Increase Nexium to 40 mg twice daily  Resume Eliquis tomorrow  Office visit with [place] in 6  months   Gastroesophageal Reflux Disease, Adult Gastroesophageal reflux (GER) happens when acid from the stomach flows up into the tube that connects the mouth and the stomach (esophagus). Normally, food travels down the esophagus and stays in the stomach to be digested. With GER, food and stomach acid sometimes move back up into the esophagus. You may have a disease called gastroesophageal reflux disease (GERD) if the reflux:  Happens often.  Causes frequent or very bad symptoms.  Causes problems such as damage to the esophagus. When this happens, the esophagus becomes sore and swollen (inflamed). Over time, GERD can make small holes (ulcers) in the lining of the esophagus. What are the causes? This condition is caused by a problem with the muscle between the esophagus and the stomach. When this muscle is weak or not normal, it does not close properly to keep food and acid from coming back up from the stomach. The muscle can be weak because of:  Tobacco use.  Pregnancy.  Having a certain type of hernia (hiatal hernia).  Alcohol use.  Certain foods and drinks, such as coffee, chocolate, onions, and peppermint. What increases the risk? You are more likely to develop this condition if you:  Are overweight.  Have a disease that affects your connective tissue.  Use NSAID medicines. What are the signs or symptoms? Symptoms of this condition include:  Heartburn.  Difficult or painful swallowing.  The feeling of having a lump in the throat.  A bitter taste in the mouth.  Bad breath.  Having a  lot of saliva.  Having an upset or bloated stomach.  Belching.  Chest pain. Different conditions can cause chest pain. Make sure you see your doctor if you have chest pain.  Shortness of breath or noisy breathing (wheezing).  Ongoing (chronic) cough or a cough at night.  Wearing away of the surface of teeth (tooth enamel).  Weight loss. How is this treated? Treatment will  depend on how bad your symptoms are. Your doctor may suggest:  Changes to your diet.  Medicine.  Surgery. Follow these instructions at home: Eating and drinking   Follow a diet as told by your doctor. You may need to avoid foods and drinks such as: ? Coffee and tea (with or without caffeine). ? Drinks that contain alcohol. ? Energy drinks and sports drinks. ? Bubbly (carbonated) drinks or sodas. ? Chocolate and cocoa. ? Peppermint and mint flavorings. ? Garlic and onions. ? Horseradish. ? Spicy and acidic foods. These include peppers, chili powder, curry powder, vinegar, hot sauces, and BBQ sauce. ? Citrus fruit juices and citrus fruits, such as oranges, lemons, and limes. ? Tomato-based foods. These include red sauce, chili, salsa, and pizza with red sauce. ? Fried and fatty foods. These include donuts, french fries, potato chips, and high-fat dressings. ? High-fat meats. These include hot dogs, rib eye steak, sausage, ham, and bacon. ? High-fat dairy items, such as whole milk, butter, and cream cheese.  Eat small meals often. Avoid eating large meals.  Avoid drinking large amounts of liquid with your meals.  Avoid eating meals during the 2-3 hours before bedtime.  Avoid lying down right after you eat.  Do not exercise right after you eat. Lifestyle   Do not use any products that contain nicotine or tobacco. These include cigarettes, e-cigarettes, and chewing tobacco. If you need help quitting, ask your doctor.  Try to lower your stress. If you need help doing this, ask your doctor.  If you are overweight, lose an amount of weight that is healthy for you. Ask your doctor about a safe weight loss goal. General instructions  Pay attention to any changes in your symptoms.  Take over-the-counter and prescription medicines only as told by your doctor. Do not take aspirin, ibuprofen, or other NSAIDs unless your doctor says it is okay.  Wear loose clothes. Do not wear  anything tight around your waist.  Raise (elevate) the head of your bed about 6 inches (15 cm).  Avoid bending over if this makes your symptoms worse.  Keep all follow-up visits as told by your doctor. This is important. Contact a doctor if:  You have new symptoms.  You lose weight and you do not know why.  You have trouble swallowing or it hurts to swallow.  You have wheezing or a cough that keeps happening.  Your symptoms do not get better with treatment.  You have a hoarse voice. Get help right away if:  You have pain in your arms, neck, jaw, teeth, or back.  You feel sweaty, dizzy, or light-headed.  You have chest pain or shortness of breath.  You throw up (vomit) and your throw-up looks like blood or coffee grounds.  You pass out (faint).  Your poop (stool) is bloody or black.  You cannot swallow, drink, or eat. Summary  If a person has gastroesophageal reflux disease (GERD), food and stomach acid move back up into the esophagus and cause symptoms or problems such as damage to the esophagus.  Treatment will depend on how  bad your symptoms are.  Follow a diet as told by your doctor.  Take all medicines only as told by your doctor. This information is not intended to replace advice given to you by your health care provider. Make sure you discuss any questions you have with your health care provider. Document Released: 03/03/2008 Document Revised: 03/24/2018 Document Reviewed: 03/24/2018 Elsevier Interactive Patient Education  2019 [employer].

## 2018-11-19 ENCOUNTER — Encounter (HOSPITAL_COMMUNITY): Payer: Self-pay | Admitting: Internal Medicine

## 2018-11-22 ENCOUNTER — Other Ambulatory Visit: Payer: Self-pay

## 2018-11-22 ENCOUNTER — Telehealth: Payer: Self-pay

## 2018-11-22 MED ORDER — FLUCONAZOLE 100 MG PO TABS: ORAL_TABLET | ORAL | 0 refills | Status: DC

## 2018-11-22 NOTE — Telephone Encounter (Signed)
Medication sent in to pts pharmacy. Pt is aware.

## 2018-11-22 NOTE — Telephone Encounter (Signed)
Per RMR-  This KOH prep positive.  plse let pt know and call in Rx for diflucan 269m po initially, then 1063mdaily. Po daily x 21 day then stop. No refills. Thx.

## 2018-11-29 DIAGNOSIS — E119 Type 2 diabetes mellitus without complications: Secondary | ICD-10-CM | POA: Diagnosis not present

## 2018-11-29 DIAGNOSIS — Z6834 Body mass index (BMI) 34.0-34.9, adult: Secondary | ICD-10-CM | POA: Diagnosis not present

## 2018-11-29 DIAGNOSIS — J309 Allergic rhinitis, unspecified: Secondary | ICD-10-CM | POA: Diagnosis not present

## 2018-11-29 DIAGNOSIS — J0101 Acute recurrent maxillary sinusitis: Secondary | ICD-10-CM | POA: Diagnosis not present

## 2018-11-29 DIAGNOSIS — J4520 Mild intermittent asthma, uncomplicated: Secondary | ICD-10-CM | POA: Diagnosis not present

## 2018-12-06 DIAGNOSIS — J0101 Acute recurrent maxillary sinusitis: Secondary | ICD-10-CM | POA: Diagnosis not present

## 2018-12-06 DIAGNOSIS — Z6833 Body mass index (BMI) 33.0-33.9, adult: Secondary | ICD-10-CM | POA: Diagnosis not present

## 2018-12-07 DIAGNOSIS — E1165 Type 2 diabetes mellitus with hyperglycemia: Secondary | ICD-10-CM | POA: Diagnosis not present

## 2018-12-07 DIAGNOSIS — E782 Mixed hyperlipidemia: Secondary | ICD-10-CM | POA: Diagnosis not present

## 2018-12-07 DIAGNOSIS — E119 Type 2 diabetes mellitus without complications: Secondary | ICD-10-CM | POA: Diagnosis not present

## 2018-12-07 DIAGNOSIS — N189 Chronic kidney disease, unspecified: Secondary | ICD-10-CM | POA: Diagnosis not present

## 2018-12-07 DIAGNOSIS — G4733 Obstructive sleep apnea (adult) (pediatric): Secondary | ICD-10-CM | POA: Diagnosis not present

## 2018-12-07 DIAGNOSIS — K5732 Diverticulitis of large intestine without perforation or abscess without bleeding: Secondary | ICD-10-CM | POA: Diagnosis not present

## 2018-12-07 DIAGNOSIS — E7800 Pure hypercholesterolemia, unspecified: Secondary | ICD-10-CM | POA: Diagnosis not present

## 2018-12-07 DIAGNOSIS — K219 Gastro-esophageal reflux disease without esophagitis: Secondary | ICD-10-CM | POA: Diagnosis not present

## 2018-12-13 ENCOUNTER — Other Ambulatory Visit: Payer: Self-pay

## 2018-12-13 ENCOUNTER — Ambulatory Visit (INDEPENDENT_AMBULATORY_CARE_PROVIDER_SITE_OTHER): Payer: Medicare Other | Admitting: Cardiology

## 2018-12-13 ENCOUNTER — Encounter: Payer: Self-pay | Admitting: Cardiology

## 2018-12-13 ENCOUNTER — Encounter: Payer: Self-pay | Admitting: *Deleted

## 2018-12-13 VITALS — BP 142/78 | HR 72 | Ht 64.0 in | Wt 198.6 lb

## 2018-12-13 DIAGNOSIS — I4891 Unspecified atrial fibrillation: Secondary | ICD-10-CM | POA: Diagnosis not present

## 2018-12-13 DIAGNOSIS — R0789 Other chest pain: Secondary | ICD-10-CM | POA: Diagnosis not present

## 2018-12-13 DIAGNOSIS — E782 Mixed hyperlipidemia: Secondary | ICD-10-CM | POA: Diagnosis not present

## 2018-12-13 NOTE — Patient Instructions (Addendum)

## 2018-12-13 NOTE — Progress Notes (Signed)
Clinical Summary [name] is a 68 y.o.female seen today for follow up of the following medical problems.   1. Chest pain - history of atypical chest pain.  - 06/2014 Lexiscan MPI without ischemia - she has had prior EGDs with dilatation as well that has previously improved her symptoms. .  -05/2017 echo with normal LVEF   - isolated episode of chest pain, better with inhaler use. No recurrence.    2. Hyperlipidemia - tolerating pravastatin well, previous muscle aches on lipitor Labs followed by pcp  3. Ulcerating colitis - followed by GI   4. Dysphagia - followed by GI, recent EGD  6. Afib - rare short palpitations. Compliant with meds. No bleeding on eliquis  Past Medical History:  Diagnosis Date  . Anemia   . Anginal pain ([place])    PATIENT STATES DOCT THINKS IS FIBROMYALGIA  . Arthritis   . Asthma   . Atrial fibrillation ([place])   . Blood clot in lung last march  . 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 Chronic back pain   . COPD (chronic obstructive pulmonary disease) ([place])   . DDD (degenerative disc disease)   . Degenerative tear of posterior horn of lateral meniscus of right knee 01/27/2014  . Depression   . Diabetes mellitus without complication ([place])   . Diverticulitis   . Fibromyalgia   . GERD (gastroesophageal reflux disease)   . Headache    HX MIGRAINES    . Hiatal hernia   . HOH (hard of hearing)    right  . Pancreatitis   . Pneumonia    1 YR  . PONV (postoperative nausea and vomiting)    only happened after knee arthroscopy  . Primary localized osteoarthritis of right knee 12/11/2015  . PVC's (premature ventricular contractions)   . Schatzki's ring   . Seizures ([place])    had febrile seizures as child; none since childhood and on no meds  . Shortness of breath dyspnea    WITH EXERTION   . Sleep apnea    CPAP at night  . Ulcerative colitis 2007  . Wears dentures    full top-partial bottom     Allergies  Allergen Reactions  .  Mesalamine Other (See Comments)    Pancreatitis   . Cefprozil Other (See Comments)    Aggravates Ulcerative colitis   . Cefuroxime Axetil Other (See Comments)    Aggravates Ulcerative colitis   . Morphine And Related Hives and Itching  . Penicillins Hives    Has patient had a PCN reaction causing immediate rash, facial/tongue/throat swelling, SOB or lightheadedness with hypotension: No Has patient had a PCN reaction causing severe rash involving mucus membranes or skin necrosis: No Has patient had a PCN reaction that required hospitalization No Has patient had a PCN reaction occurring within the last 10 years: yes If all of the above answers are "NO", then may proceed with Cephalosporin use.   . Aspirin Other (See Comments)    Affects the central nervous system in high doses . "shakey"  . Chicken Allergy Nausea And Vomiting and Rash  . Clarithromycin Nausea And Vomiting  . Codeine Other (See Comments)    Hallucinations/bad dreams.  . Entex Other (See Comments)    insomnia     Current Outpatient Medications  Medication Sig Dispense Refill  . acetaminophen (TYLENOL) 500 MG tablet Take 1,000 mg by mouth every 6 (six) hours as needed for moderate pain or headache.    Marland Kitchen  ADVAIR DISKUS 250-50 MCG/DOSE AEPB Inhale 1 puff into the lungs 2 (two) times daily.     Marland Kitchen albuterol (PROVENTIL) (2.5 MG/3ML) 0.083% nebulizer solution Take 2.5 mg by nebulization every 6 (six) hours as needed for wheezing or shortness of breath.    Marland Kitchen albuterol (PROVENTIL,VENTOLIN) 90 MCG/ACT inhaler Inhale 2 puffs into the lungs every 6 (six) hours as needed for wheezing or shortness of breath.     Marland Kitchen azelastine (ASTELIN) 137 MCG/SPRAY nasal spray 2 sprays by Nasal route 2 (two) times daily. Use in each nostril as directed     . buPROPion (WELLBUTRIN XL) 150 MG 24 hr tablet Take 150 mg by mouth daily.    . busPIRone (BUSPAR) 15 MG tablet Take 7.5 mg by mouth 2 (two) times daily.    . Calcium Carb-Cholecalciferol  (CALCIUM-VITAMIN D) 600-400 MG-UNIT TABS Take 1 tablet by mouth 2 (two) times daily.    . cetirizine (ZYRTEC) 10 MG tablet Take 10 mg by mouth daily.    Marland Kitchen COLAZAL 750 MG capsule TAKE 3 CAPSULES TWICE A DAY (Patient taking differently: Take 2,250 mg by mouth 2 (two) times daily. ) 540 capsule 4  . dicyclomine (BENTYL) 10 MG capsule TAKE 1 CAPSULE FOUR TIMES A DAY BEFORE MEALS AND AT BEDTIME (Patient taking differently: Take 10 mg by mouth 4 (four) times daily. ) 360 capsule 4  . DULoxetine (CYMBALTA) 30 MG capsule Take 90 mg by mouth daily.     Marland Kitchen ELIQUIS 5 MG TABS tablet Take 1 tablet (5 mg total) by mouth 2 (two) times daily. 180 tablet 3  . esomeprazole (NEXIUM) 40 MG capsule Take 1 capsule (40 mg total) by mouth 2 (two) times daily before a meal. 90 capsule 3  . famotidine-calcium carbonate-magnesium hydroxide (PEPCID COMPLETE) 10-800-165 MG CHEW chewable tablet Chew 1 tablet by mouth daily as needed (for breakthrough GERD).     . ferrous sulfate 325 (65 FE) MG tablet Take 325 mg by mouth daily with breakfast.    . fluconazole (DIFLUCAN) 100 MG tablet Take 2 tabs( 200 mg) on day one, then start 1 tab daily x 21 days 23 tablet 0  . ipratropium (ATROVENT) 0.03 % nasal spray Place 2 sprays into both nostrils 2 (two) times daily.     Marland Kitchen ketotifen (ALAWAY) 0.025 % ophthalmic solution Place 1 drop into both eyes daily.     Marland Kitchen lidocaine (LIDODERM) 5 % Place 1-2 patches onto the skin daily as needed (pain). Remove & Discard patch within 12 hours or as directed by MD    . methylcellulose (CITRUCEL) oral powder Take 1 packet by mouth daily as needed (constipation).     . metoprolol tartrate (LOPRESSOR) 25 MG tablet Take 0.5 tablets (12.5 mg total) by mouth 2 (two) times daily. 90 tablet 2  . pravastatin (PRAVACHOL) 20 MG tablet Take 1 tablet (20 mg total) by mouth daily. (Patient taking differently: Take 20 mg by mouth daily with supper. ) 90 tablet 1  . pregabalin (LYRICA) 100 MG capsule Take 100 mg by mouth  2 (two) times daily.    . Probiotic CAPS Take 1 capsule by mouth daily.    . traZODone (DESYREL) 100 MG tablet Take 100 mg by mouth at bedtime.      Marland Kitchen White Petrolatum-Mineral Oil (EQ RESTORE PM OP) Apply 1 application to eye at bedtime.     No current facility-administered medications for this visit.      Past Surgical History:  Procedure Laterality Date  .  arch and foot    . BALLOON DILATION  11/17/2018   Procedure: BALLOON DILATION;  Surgeon: Daneil Dolin, MD;  [facility];  Service: Endoscopy;;  esophagus  . CERVICAL CONE BIOPSY    . CHOLECYSTECTOMY    . COLONOSCOPY  12/03/05   Rourk-marked inflammatory changes of the rectum, left colon, pan colitis, internal hemorrhoids, sigmoid diverticula  . COLONOSCOPY N/A 10/20/2013   Dr. Rourk:Colonic diverticulosis. colonic mucosal ulcerations involving segment of sigmoid colon, atypical for UC. sigmoid colon path with chronic mildly active colitis consistent with IBD, other biopsies including ascending, transverse, descending colon and rectum unremarkable.   . COLONOSCOPY N/A 11/19/2016   Dr. Gala Romney: Diverticulosis, 7 millimeter polyp removed from the descending colon, internal hemorrhoids, segmental biopsies unremarkable.  Marland Kitchen DILATION AND CURETTAGE OF UTERUS    . ESOPHAGEAL DILATION N/A 09/20/2015   Procedure: ESOPHAGEAL DILATION;  Surgeon: Daneil Dolin, MD;  [facility];  Service: Endoscopy;  Laterality: N/A;  . ESOPHAGOGASTRODUODENOSCOPY  01/28/2011   Rourk- Schatzki ring, s/p 84F, otherwise normal/Hiatal hernia otherwise normal stomach D1 and D2  . ESOPHAGOGASTRODUODENOSCOPY  11/30/08   Rourk-Schatzki's ring status post dilation, hiatal hernia  . ESOPHAGOGASTRODUODENOSCOPY  07/12/2007   Dilation with 56 French, Schatzki's ring  . ESOPHAGOGASTRODUODENOSCOPY N/A 08/10/2014   Dr.Rourk- prominent schatzki's ring s/p maloney dilation and disruption, moderate sized hiatal hernia  . ESOPHAGOGASTRODUODENOSCOPY N/A  09/20/2015   RMR: Schatzki's ring status post dilation as described above. Rather large hiatal hernia; somewhat baggy atonic esophageal body' otherwise normal EGD  . ESOPHAGOGASTRODUODENOSCOPY N/A 11/19/2016   Dr. Gala Romney: moderate Schatzki ring s/p dirsuption with biopsy forceps, large hiatal hernia  . ESOPHAGOGASTRODUODENOSCOPY N/A 11/17/2018   Procedure: ESOPHAGOGASTRODUODENOSCOPY (EGD);  Surgeon: Daneil Dolin, MD;  [facility];  Service: Endoscopy;  Laterality: N/A;  12:00PM  . ESOPHAGOGASTRODUODENOSCOPY (EGD) WITH ESOPHAGEAL DILATION  09/01/2012   Dr. Gala Romney- schatzki's ring, hiatal hernia  . ESOPHAGOGASTRODUODENOSCOPY (EGD) WITH PROPOFOL N/A 02/08/2018   Procedure: ESOPHAGOGASTRODUODENOSCOPY (EGD) WITH PROPOFOL;  Surgeon: Daneil Dolin, MD;  [facility];  Service: Endoscopy;  Laterality: N/A;  10:30am  . FOOT ARTHRODESIS  5573,2202   both feet  . HERNIA REPAIR     umbilical  . JOINT REPLACEMENT Right   . KNEE ARTHROSCOPY WITH LATERAL MENISECTOMY Right 01/27/2014   Procedure: RIGHT KNEE ARTHROSCOPY WITH PARTIAL LATERAL MENISECTOMY, ;  Surgeon: Johnny Bridge, MD;  Location: [facility];  Service: Orthopedics;  Laterality: Right;  . MALONEY DILATION N/A 08/10/2014   Procedure: [name] DILATION;  Surgeon: Daneil Dolin, MD;  [facility];  Service: Endoscopy;  Laterality: N/A;  [name] DILATION N/A 11/19/2016   Procedure: [name] DILATION;  Surgeon: Daneil Dolin, MD;  [facility];  Service: Endoscopy;  Laterality: N/A;  . [name] DILATION N/A 02/08/2018   Procedure: [name] DILATION;  Surgeon: Daneil Dolin, MD;  [facility];  Service: Endoscopy;  Laterality: N/A;  [name] DILATION N/A 11/17/2018   Procedure: [name] DILATION;  Surgeon: Daneil Dolin, MD;  [facility];  Service: Endoscopy;  Laterality: N/A;  . SAVORY DILATION N/A 08/10/2014   Procedure: SAVORY DILATION;  Surgeon: Daneil Dolin, MD;   [facility];  Service: Endoscopy;  Laterality: N/A;  . SHOULDER OPEN ROTATOR CUFF REPAIR     BILAT  . TONSILLECTOMY    . TOTAL KNEE ARTHROPLASTY Right 12/11/2015   Procedure: TOTAL KNEE ARTHROPLASTY;  Surgeon: Marchia Bond, MD;  Location: [facility];  Service: Orthopedics;  Laterality: Right;     Allergies  Allergen Reactions  . Mesalamine Other (See Comments)    Pancreatitis   . Cefprozil Other (See Comments)    Aggravates Ulcerative colitis   . Cefuroxime Axetil Other (See Comments)    Aggravates Ulcerative colitis   . Morphine And Related Hives and Itching  . Penicillins Hives    Has patient had a PCN reaction causing immediate rash, facial/tongue/throat swelling, SOB or lightheadedness with hypotension: No Has patient had a PCN reaction causing severe rash involving mucus membranes or skin necrosis: No Has patient had a PCN reaction that required hospitalization No Has patient had a PCN reaction occurring within the last 10 years: yes If all of the above answers are "NO", then may proceed with Cephalosporin use.   . Aspirin Other (See Comments)    Affects the central nervous system in high doses . "shakey"  . Chicken Allergy Nausea And Vomiting and Rash  . Clarithromycin Nausea And Vomiting  . Codeine Other (See Comments)    Hallucinations/bad dreams.  . Entex Other (See Comments)    insomnia      Family History  Problem Relation Age of Onset  . Heart disease Mother   . Atrial fibrillation Sister   . Arrhythmia Brother   . Colon cancer Neg Hx      Social History [name] reports that she quit smoking about 7 years ago. Her smoking use included cigarettes. She started smoking about 49 years ago. She has a 30.00 pack-year smoking history. She has never used smokeless tobacco. [name] reports no history of alcohol use.   Review of Systems CONSTITUTIONAL: No weight loss, fever, chills, weakness or fatigue.  HEENT: Eyes: No visual loss, blurred  vision, double vision or yellow sclerae.No hearing loss, sneezing, congestion, runny nose or sore throat.  SKIN: No rash or itching.  CARDIOVASCULAR: per hpi RESPIRATORY: No shortness of breath, cough or sputum.  GASTROINTESTINAL: No anorexia, nausea, vomiting or diarrhea. No abdominal pain or blood.  GENITOURINARY: No burning on urination, no polyuria NEUROLOGICAL: No headache, dizziness, syncope, paralysis, ataxia, numbness or tingling in the extremities. No change in bowel or bladder control.  MUSCULOSKELETAL: No muscle, back pain, joint pain or stiffness.  LYMPHATICS: No enlarged nodes. No history of splenectomy.  PSYCHIATRIC: No history of depression or anxiety.  ENDOCRINOLOGIC: No reports of sweating, cold or heat intolerance. No polyuria or polydipsia.  Marland Kitchen   Physical Examination Today's Vitals   12/13/18 0907  BP: (!) 142/78  Pulse: 72  SpO2: 97%  Weight: 198 lb 9.6 oz (90.1 kg)  Height: 5' 4"  (1.626 m)   Body mass index is 34.09 kg/m.  Gen: resting comfortably, no acute distress HEENT: no scleral icterus, pupils equal round and reactive, no palptable cervical adenopathy,  CV: RRR, no m/r/g, no jvd Resp: Clear to auscultation bilaterally GI: abdomen is soft, non-tender, non-distended, normal bowel sounds, no hepatosplenomegaly MSK: extremities are warm, no edema.  Skin: warm, no rash Neuro:  no focal deficits Psych: appropriate affect   Diagnostic Studies 06/2014 Lexiscan MPI IMPRESSION: 1. No reversible ischemia or infarction.  2. Normal left ventricular wall motion.  3. Left ventricular ejection fraction 60%  4. Low-risk stress test findings*.   07/2014 Echo Study Conclusions  - Left ventricle: The cavity size was normal. Wall thickness was normal. Systolic function was normal. The estimated ejection fraction was in the range of 60% to 65%. -  Aortic valve: Valve area (VTI): 3.31 cm^2. Valve area (Vmax): 3.27 cm^2. Valve area (Vmean): 2.77  cm^2. - Left atrium: The atrium was mildly dilated. - Technically difficult study.  [facility]00%, normal diastolic function,     Assessment and Plan  1. Chest pain - no recent cardiac symptoms, previous stress testing benign - continue to monitor at this time.   2. Hyperlipidemia - Previous side effects on lipitor, tolerating pravastatin well.  - request labs from pcp, continue statin,.   3.Afib - no symptoms, she will continue current meds including anticoag.    F/u 6 months   Arnoldo Lenis, M.D.

## 2018-12-14 DIAGNOSIS — Z0001 Encounter for general adult medical examination with abnormal findings: Secondary | ICD-10-CM | POA: Diagnosis not present

## 2018-12-14 DIAGNOSIS — E1165 Type 2 diabetes mellitus with hyperglycemia: Secondary | ICD-10-CM | POA: Diagnosis not present

## 2018-12-14 DIAGNOSIS — Z6834 Body mass index (BMI) 34.0-34.9, adult: Secondary | ICD-10-CM | POA: Diagnosis not present

## 2018-12-23 DIAGNOSIS — J019 Acute sinusitis, unspecified: Secondary | ICD-10-CM | POA: Diagnosis not present

## 2018-12-24 DIAGNOSIS — Z4889 Encounter for other specified surgical aftercare: Secondary | ICD-10-CM | POA: Diagnosis not present

## 2019-01-21 ENCOUNTER — Other Ambulatory Visit: Payer: Self-pay

## 2019-01-21 ENCOUNTER — Ambulatory Visit (INDEPENDENT_AMBULATORY_CARE_PROVIDER_SITE_OTHER): Payer: Medicare Other | Admitting: Allergy & Immunology

## 2019-01-21 ENCOUNTER — Encounter: Payer: Self-pay | Admitting: Allergy & Immunology

## 2019-01-21 VITALS — BP 148/82 | HR 78 | Temp 98.4°F | Resp 16 | Ht 64.0 in | Wt 200.0 lb

## 2019-01-21 DIAGNOSIS — J449 Chronic obstructive pulmonary disease, unspecified: Secondary | ICD-10-CM | POA: Diagnosis not present

## 2019-01-21 DIAGNOSIS — J3089 Other allergic rhinitis: Secondary | ICD-10-CM | POA: Diagnosis not present

## 2019-01-21 DIAGNOSIS — J302 Other seasonal allergic rhinitis: Secondary | ICD-10-CM

## 2019-01-21 DIAGNOSIS — T781XXD Other adverse food reactions, not elsewhere classified, subsequent encounter: Secondary | ICD-10-CM | POA: Diagnosis not present

## 2019-01-21 MED ORDER — FLUTICASONE PROPIONATE 50 MCG/ACT NA SUSP: 2.0000 | Freq: Every day | NASAL | 5 refills | Status: DC

## 2019-01-21 NOTE — Patient Instructions (Addendum)
1. COPD - Continue with Advair 250/50 one puff twice daily. - Continue with albuterol 2 puffs every 4-6 hours as needed. - We are not going to make any medication changes at this time.  2. Perennial and seasonal allergic rhinitis - Testing today showed: grasses, ragweed, weeds, trees, indoor molds, outdoor molds, cat and dog - Copy of test results provided.  - Avoidance measures provided. - Stop taking: Zyrtec and ipratropium - Continue with: Astelin (azelastine) 2 sprays per nostril 1-2 times daily as needed - Start taking: Xyzal (levocetirizine) 74m tablet once daily and Nasacort (triamcinolone) two sprays per nostril daily - You can use an extra dose of the antihistamine, if needed, for breakthrough symptoms.  - Consider nasal saline rinses 1-2 times daily to remove allergens from the nasal cavities as well as help with mucous clearance (this is especially helpful to do before the nasal sprays are given) - Consider allergy shots as a means of long-term control (information provided).  - Allergy shots "re-train" and "reset" the immune system to ignore environmental allergens and decrease the resulting immune response to those allergens (sneezing, itchy watery eyes, runny nose, nasal congestion, etc).    - Allergy shots improve symptoms in 75-85% of patients.  - Call [facility] make a decision about this. - [facility] [place] [facility] but we will check with them to make sure.  3. Return in about 4 weeks (around 02/18/2019). This can be an in-person, a virtual Webex or a telephone follow up visit.   Please inform [facility] any Emergency Department visits, hospitalizations, or changes in symptoms. Call [facility] going to the ED for breathing or allergy symptoms since we might be able to fit you in for a sick visit. Feel free to contact [facility] with any questions, problems, or concerns.  It was a pleasure to meet you3 today!  Websites that have reliable patient  information: 1. American Academy of Asthma, Allergy, and Immunology: www.aaaai.org 2. Food Allergy Research and Education (FARE): foodallergy.org 3. Mothers of Asthmatics: http://www.asthmacommunitynetwork.org 4. American College of Allergy, Asthma, and Immunology: www.acaai.org  "Like" [facility] Facebook and Instagram for our latest updates!      Make sure you are registered to vote! If you have moved or changed any of your contact information, you will need to get this updated before voting!    Voter ID laws are NOT going into effect for the General Election in November 2020! DO NOT let this stop you from exercising your right to vote!    Reducing Pollen Exposure  The American Academy of Allergy, Asthma and Immunology suggests the following steps to reduce your exposure to pollen during allergy seasons.    1. Do not hang sheets or clothing out to dry; pollen may collect on these items. 2. Do not mow lawns or spend time around freshly cut grass; mowing stirs up pollen. 3. Keep windows closed at night.  Keep car windows closed while driving. 4. Minimize morning activities outdoors, a time when pollen counts are usually at their highest. 5. Stay indoors as much as possible when pollen counts or humidity is high and on windy days when pollen tends to remain in the air longer. 6. Use air conditioning when possible.  Many air conditioners have filters that trap the pollen spores. 7. Use a HEPA room air filter to remove pollen form the indoor air you breathe.  Control of Mold Allergen   Mold and fungi can  grow on a variety of surfaces provided certain temperature and moisture conditions exist.  Outdoor molds grow on plants, decaying vegetation and soil.  The major outdoor mold, Alternaria and Cladosporium, are found in very high numbers during hot and dry conditions.  Generally, a late Summer - Fall peak is seen for common outdoor fungal spores.  Rain will temporarily lower outdoor mold spore  count, but counts rise rapidly when the rainy period ends.  The most important indoor molds are Aspergillus and Penicillium.  Dark, humid and poorly ventilated basements are ideal sites for mold growth.  The next most common sites of mold growth are the bathroom and the kitchen.  Outdoor (Seasonal) Mold Control  1. Use air conditioning and keep windows closed 2. Avoid exposure to decaying vegetation. 3. Avoid leaf raking. 4. Avoid grain handling. 5. Consider wearing a face mask if working in moldy areas.    Indoor (Perennial) Mold Control    1. Maintain humidity below 50%. 2. Clean washable surfaces with 5% bleach solution. 3. Remove sources e.g. contaminated carpets.     Control of Dog or Cat Allergen  Avoidance is the best way to manage a dog or cat allergy. If you have a dog or cat and are allergic to dog or cats, consider removing the dog or cat from the home. If you have a dog or cat but don't want to find it a new home, or if your family wants a pet even though someone in the household is allergic, here are some strategies that may help keep symptoms at bay:  1. Keep the pet out of your bedroom and restrict it to only a few rooms. Be advised that keeping the dog or cat in only one room will not limit the allergens to that room. 2. Don't pet, hug or kiss the dog or cat; if you do, wash your hands with soap and water. 3. High-efficiency particulate air (HEPA) cleaners run continuously in a bedroom or living room can reduce allergen levels over time. 4. Regular use of a high-efficiency vacuum cleaner or a central vacuum can reduce allergen levels. 5. Giving your dog or cat a bath at least once a week can reduce airborne allergen.  Allergy Shots   Allergies are the result of a chain reaction that starts in the immune system. Your immune system controls how your body defends itself. For instance, if you have an allergy to pollen, your immune system identifies pollen as an invader  or allergen. Your immune system overreacts by producing antibodies called Immunoglobulin E (IgE). These antibodies travel to cells that release chemicals, causing an allergic reaction.  The concept behind allergy immunotherapy, whether it is received in the form of shots or tablets, is that the immune system can be desensitized to specific allergens that trigger allergy symptoms. Although it requires time and patience, the payback can be long-term relief.  How Do Allergy Shots Work?  Allergy shots work much like a vaccine. Your body responds to injected amounts of a particular allergen given in increasing doses, eventually developing a resistance and tolerance to it. Allergy shots can lead to decreased, minimal or no allergy symptoms.  There generally are two phases: build-up and maintenance. Build-up often ranges from three to six months and involves receiving injections with increasing amounts of the allergens. The shots are typically given once or twice a week, though more rapid build-up schedules are sometimes used.  The maintenance phase begins when the most effective dose is reached. This  dose is different for each person, depending on how allergic you are and your response to the build-up injections. Once the maintenance dose is reached, there are longer periods between injections, typically two to four weeks.  Occasionally doctors give cortisone-type shots that can temporarily reduce allergy symptoms. These types of shots are different and should not be confused with allergy immunotherapy shots.  Who Can Be Treated with Allergy Shots?  Allergy shots may be a good treatment approach for people with allergic rhinitis (hay fever), allergic asthma, conjunctivitis (eye allergy) or stinging insect allergy.   Before deciding to begin allergy shots, you should consider:  . The length of allergy season and the severity of your symptoms . Whether medications and/or changes to your environment can  control your symptoms . Your desire to avoid long-term medication use . Time: allergy immunotherapy requires a major time commitment . Cost: may vary depending on your insurance coverage  Allergy shots for children age 41 and older are effective and often well tolerated. They might prevent the onset of new allergen sensitivities or the progression to asthma.  Allergy shots are not started on patients who are pregnant but can be continued on patients who become pregnant while receiving them. In some patients with other medical conditions or who take certain common medications, allergy shots may be of risk. It is important to mention other medications you talk to your allergist.   When Will I Feel Better?  Some may experience decreased allergy symptoms during the build-up phase. For others, it may take as long as 12 months on the maintenance dose. If there is no improvement after a year of maintenance, your allergist will discuss other treatment options with you.  If you aren't responding to allergy shots, it may be because there is not enough dose of the allergen in your vaccine or there are missing allergens that were not identified during your allergy testing. Other reasons could be that there are high levels of the allergen in your environment or major exposure to non-allergic triggers like tobacco smoke.  What Is the Length of Treatment?  Once the maintenance dose is reached, allergy shots are generally continued for three to five years. The decision to stop should be discussed with your allergist at that time. Some people may experience a permanent reduction of allergy symptoms. Others may relapse and a longer course of allergy shots can be considered.  What Are the Possible Reactions?  The two types of adverse reactions that can occur with allergy shots are local and systemic. Common local reactions include very mild redness and swelling at the injection site, which can happen immediately  or several hours after. A systemic reaction, which is less common, affects the entire body or a particular body system. They are usually mild and typically respond quickly to medications. Signs include increased allergy symptoms such as sneezing, a stuffy nose or hives.  Rarely, a serious systemic reaction called anaphylaxis can develop. Symptoms include swelling in the throat, wheezing, a feeling of tightness in the chest, nausea or dizziness. Most serious systemic reactions develop within 30 minutes of allergy shots. This is why it is strongly recommended you wait in your doctor's office for 30 minutes after your injections. Your allergist is trained to watch for reactions, and his or her staff is trained and equipped with the proper medications to identify and treat them.  Who Should Administer Allergy Shots?  The preferred location for receiving shots is your prescribing allergist's office. Injections can  sometimes be given at another facility where the physician and staff are trained to recognize and treat reactions, and have received instructions by your prescribing allergist.

## 2019-01-21 NOTE — Progress Notes (Signed)
NEW PATIENT  Date of Service/Encounter:  01/21/19  Referring provider: Jalene Mullet, PA-C   Assessment:   Seasonal and perennial allergic rhinitis (grasses, ragweed, weeds, trees, indoor molds, outdoor molds, cat and dog)  Asthma with COPD overlap   Plan/Recommendations:   1. COPD - Continue with Advair 250/50 one puff twice daily. - Continue with albuterol 2 puffs every 4-6 hours as needed. - We are not going to make any medication changes at this time.  2. Perennial and seasonal allergic rhinitis - Testing today showed: grasses, ragweed, weeds, trees, indoor molds, outdoor molds, cat and dog - Copy of test results provided.  - Avoidance measures provided. - Stop taking: Zyrtec and ipratropium - Continue with: Astelin (azelastine) 2 sprays per nostril 1-2 times daily as needed - Start taking: Xyzal (levocetirizine) 67m tablet once daily and Nasacort (triamcinolone) two sprays per nostril daily - You can use an extra dose of the antihistamine, if needed, for breakthrough symptoms.  - Consider nasal saline rinses 1-2 times daily to remove allergens from the nasal cavities as well as help with mucous clearance (this is especially helpful to do before the nasal sprays are given) - Consider allergy shots as a means of long-term control (information provided).  - Allergy shots "re-train" and "reset" the immune system to ignore environmental allergens and decrease the resulting immune response to those allergens (sneezing, itchy watery eyes, runny nose, nasal congestion, etc).    - Allergy shots improve symptoms in 75-85% of patients.  - Call [facility] you make a decision about this. - You might be able to get the shots at [facility] but we will check with them to make sure.  3. Return in about 4 weeks (around 02/18/2019). This can be an in-person, a virtual Webex or a telephone follow up visit.    Subjective:   Paula HACKLEMANis a 68y.o. female presenting today for evaluation  of  Chief Complaint  Patient presents with  . Sinus Problem    Paula L LMonroyhas a history of the following: Patient Active Problem List   Diagnosis Date Noted  . IDA (iron deficiency anemia) 08/11/2018  . Diarrhea 01/06/2018  . Primary localized osteoarthritis of right knee 12/11/2015  . 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  . Hiatal hernia   . Schatzki's ring   . Degenerative tear of posterior horn of lateral meniscus of right knee 01/27/2014  . Ulcerative colitis [place] 01/09/2011  . Dysphagia 01/09/2011  . CONSTIPATION 08/01/2009  . LLQ pain 08/01/2009  . GERD 06/14/2009  . ULCERATIVE COLITIS--UNIVERSAL ULCERATIVE COLITIS 06/14/2009    History obtained from: chart review and patient.  Paula Heywas referred by BJalene Mullet PA-C.     MLalaniis a 68y.o. female presenting for an evaluation of rhinitis.   She is on a nose spray for her sinus problems (azelastine and ipratropium). She is on Zyrtec daily for the past 4-5 years. This combination of medications is working somewhat to a "certain extent". She does get sinus infections quite often. She estimates that after Christmas she was having one right after another. She was placed on antibiotics every time with steroids occasionally. She has gotten a few steroid injections just since Christmas.   Asthma/Respiratory Symptom History: She is on Advair and has a rescue inhaler. She is on the 250/50 one puff twice daily. She estimates that she goes through an albuterol MDI every 3-4 months. She estimates that she gets steroids  for breathing problems around every 3-4 months. Her PCP manages her breathing problems and overall she feels that she is well controlled.   Allergic Rhinitis Symptom History: She reports that she started having problems with her sinuses years ago. She tells me that it started when she was in college but overall it has progressively worsened over the course of the year or so. She reports  sinus congestion and pressure as well as headaches. She did have some testing in college, but knows that she was allergic to oak trees. Otherwise she does not remember the results of the testing.   Food Allergy Symptom History: She reports that she has problems when she eats chicken. She reports "stomach cramps" and she will occasionally break out from these. She had this since 1984 around the birth of her second son. She avoids all animals with feathers at this point.   Eczema Symptom Symptom History: She does have some problems with eczema. She has an over the counter cream for this. She does not have a topical steroid.  She has a history of recurrent sinusitis. Aside from the sinus infection, she did have pneumonia three years ago and was in the hospital for that. It started in December and then did not clear until February.     Otherwise, there is no history of other atopic diseases, including drug allergies, stinging insect allergies, urticaria or contact dermatitis. There is no significant infectious history. Vaccinations are up to date.    Past Medical History: Patient Active Problem List   Diagnosis Date Noted  . IDA (iron deficiency anemia) 08/11/2018  . Diarrhea 01/06/2018  . Primary localized osteoarthritis of right knee 12/11/2015  . 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  . Hiatal hernia   . Schatzki's ring   . Degenerative tear of posterior horn of lateral meniscus of right knee 01/27/2014  . Ulcerative colitis ([place]) 01/09/2011  . Dysphagia 01/09/2011  . CONSTIPATION 08/01/2009  . LLQ pain 08/01/2009  . GERD 06/14/2009  . ULCERATIVE COLITIS--UNIVERSAL ULCERATIVE COLITIS 06/14/2009    Medication List:  Allergies as of 01/21/2019      Reactions   Mesalamine Other (See Comments)   Pancreatitis    Cefprozil Other (See Comments)   Aggravates Ulcerative colitis    Cefuroxime Axetil Other (See Comments)   Aggravates Ulcerative colitis    Morphine And Related  Hives, Itching   Penicillins Hives   Has patient had a PCN reaction causing immediate rash, facial/tongue/throat swelling, SOB or lightheadedness with hypotension: No Has patient had a PCN reaction causing severe rash involving mucus membranes or skin necrosis: No Has patient had a PCN reaction that required hospitalization No Has patient had a PCN reaction occurring within the last 10 years: yes If all of the above answers are "NO", then may proceed with Cephalosporin use.   Aspirin Other (See Comments)   Affects the central nervous system in high doses . "shakey"   Chicken Allergy Nausea And Vomiting, Rash   Clarithromycin Nausea And Vomiting   Codeine Other (See Comments)   Hallucinations/bad dreams.   Entex Other (See Comments)   insomnia      Medication List       Accurate as of January 21, 2019 11:44 AM. Always use your most recent med list.        acetaminophen 500 MG tablet Commonly known as:  TYLENOL Take 1,000 mg by mouth every 6 (six) hours as needed for moderate pain or headache.   Advair Diskus  250-50 MCG/DOSE Aepb Generic drug:  Fluticasone-Salmeterol Inhale 1 puff into the lungs 2 (two) times daily.   Alaway 0.025 % ophthalmic solution Generic drug:  ketotifen Place 1 drop into both eyes daily.   albuterol (2.5 MG/3ML) 0.083% nebulizer solution Commonly known as:  PROVENTIL Take 2.5 mg by nebulization every 6 (six) hours as needed for wheezing or shortness of breath.   albuterol 90 MCG/ACT inhaler Commonly known as:  PROVENTIL,VENTOLIN Inhale 2 puffs into the lungs every 6 (six) hours as needed for wheezing or shortness of breath.   azelastine 0.1 % nasal spray Commonly known as:  ASTELIN 2 sprays by Nasal route 2 (two) times daily. Use in each nostril as directed   buPROPion 150 MG 24 hr tablet Commonly known as:  WELLBUTRIN XL Take 150 mg by mouth daily.   busPIRone 15 MG tablet Commonly known as:  BUSPAR Take 7.5 mg by mouth 2 (two) times daily.    Calcium-Vitamin D 600-400 MG-UNIT Tabs Take 1 tablet by mouth 2 (two) times daily.   cetirizine 10 MG tablet Commonly known as:  ZYRTEC Take 10 mg by mouth daily.   Citrucel oral powder Generic drug:  methylcellulose Take 1 packet by mouth daily as needed (constipation).   Colazal 750 MG capsule Generic drug:  balsalazide TAKE 3 CAPSULES TWICE A DAY   dicyclomine 10 MG capsule Commonly known as:  BENTYL TAKE 1 CAPSULE FOUR TIMES A DAY BEFORE MEALS AND AT BEDTIME   DULoxetine 30 MG capsule Commonly known as:  CYMBALTA Take 90 mg by mouth daily.   Eliquis 5 MG Tabs tablet Generic drug:  apixaban Take 1 tablet (5 mg total) by mouth 2 (two) times daily.   EQ RESTORE PM OP Apply 1 application to eye at bedtime.   esomeprazole 40 MG capsule Commonly known as:  NEXIUM Take 1 capsule (40 mg total) by mouth 2 (two) times daily before a meal.   ferrous sulfate 325 (65 FE) MG tablet Take 325 mg by mouth daily with breakfast.   fluticasone 50 MCG/ACT nasal spray Commonly known as:  FLONASE Place 2 sprays into both nostrils daily.   FREESTYLE LITE test strip Generic drug:  glucose blood   ipratropium 0.03 % nasal spray Commonly known as:  ATROVENT Place 2 sprays into both nostrils 2 (two) times daily.   lidocaine 5 % Commonly known as:  LIDODERM Place 1-2 patches onto the skin daily as needed (pain). Remove & Discard patch within 12 hours or as directed by MD   metoprolol tartrate 25 MG tablet Commonly known as:  LOPRESSOR Take 0.5 tablets (12.5 mg total) by mouth 2 (two) times daily.   Pepcid Complete 10-800-165 MG chewable tablet Generic drug:  famotidine-calcium carbonate-magnesium hydroxide Chew 1 tablet by mouth daily as needed (for breakthrough GERD).   pravastatin 20 MG tablet Commonly known as:  PRAVACHOL Take 1 tablet (20 mg total) by mouth daily.   pregabalin 100 MG capsule Commonly known as:  LYRICA Take 100 mg by mouth 2 (two) times daily.    Probiotic Caps Take 1 capsule by mouth daily.   traZODone 100 MG tablet Commonly known as:  DESYREL Take 100 mg by mouth at bedtime.       Birth History: non-contributory  Developmental History: non-contributory  Past Surgical History: Past Surgical History:  Procedure Laterality Date  . arch and foot    . BALLOON DILATION  11/17/2018   Procedure: BALLOON DILATION;  Surgeon: Daneil Dolin, MD;  [facility];  Service: Endoscopy;;  esophagus  . CERVICAL CONE BIOPSY    . CHOLECYSTECTOMY    . COLONOSCOPY  12/03/05   Rourk-marked inflammatory changes of the rectum, left colon, pan colitis, internal hemorrhoids, sigmoid diverticula  . COLONOSCOPY N/A 10/20/2013   Dr. Rourk:Colonic diverticulosis. colonic mucosal ulcerations involving segment of sigmoid colon, atypical for UC. sigmoid colon path with chronic mildly active colitis consistent with IBD, other biopsies including ascending, transverse, descending colon and rectum unremarkable.   . COLONOSCOPY N/A 11/19/2016   Dr. Gala Romney: Diverticulosis, 7 millimeter polyp removed from the descending colon, internal hemorrhoids, segmental biopsies unremarkable.  Marland Kitchen DILATION AND CURETTAGE OF UTERUS    . ESOPHAGEAL DILATION N/A 09/20/2015   Procedure: ESOPHAGEAL DILATION;  Surgeon: Daneil Dolin, MD;  [facility];  Service: Endoscopy;  Laterality: N/A;  . ESOPHAGOGASTRODUODENOSCOPY  01/28/2011   Rourk- Schatzki ring, s/p 29F, otherwise normal/Hiatal hernia otherwise normal stomach D1 and D2  . ESOPHAGOGASTRODUODENOSCOPY  11/30/08   Rourk-Schatzki's ring status post dilation, hiatal hernia  . ESOPHAGOGASTRODUODENOSCOPY  07/12/2007   Dilation with 56 French, Schatzki's ring  . ESOPHAGOGASTRODUODENOSCOPY N/A 08/10/2014   Dr.Rourk- prominent schatzki's ring s/p maloney dilation and disruption, moderate sized hiatal hernia  . ESOPHAGOGASTRODUODENOSCOPY N/A 09/20/2015   RMR: Schatzki's ring status post dilation as described  above. Rather large hiatal hernia; somewhat baggy atonic esophageal body' otherwise normal EGD  . ESOPHAGOGASTRODUODENOSCOPY N/A 11/19/2016   Dr. Gala Romney: moderate Schatzki ring s/p dirsuption with biopsy forceps, large hiatal hernia  . ESOPHAGOGASTRODUODENOSCOPY N/A 11/17/2018   Procedure: ESOPHAGOGASTRODUODENOSCOPY (EGD);  Surgeon: Daneil Dolin, MD;  [facility];  Service: Endoscopy;  Laterality: N/A;  12:00PM  . ESOPHAGOGASTRODUODENOSCOPY (EGD) WITH ESOPHAGEAL DILATION  09/01/2012   Dr. Gala Romney- schatzki's ring, hiatal hernia  . ESOPHAGOGASTRODUODENOSCOPY (EGD) WITH PROPOFOL N/A 02/08/2018   Procedure: ESOPHAGOGASTRODUODENOSCOPY (EGD) WITH PROPOFOL;  Surgeon: Daneil Dolin, MD;  [facility];  Service: Endoscopy;  Laterality: N/A;  10:30am  . FOOT ARTHRODESIS  [id number]   both feet  . HERNIA REPAIR     umbilical  . JOINT REPLACEMENT Right   . KNEE ARTHROSCOPY WITH LATERAL MENISECTOMY Right 01/27/2014   Procedure: RIGHT KNEE ARTHROSCOPY WITH PARTIAL LATERAL MENISECTOMY, ;  Surgeon: Johnny Bridge, MD;  Location: [place];  Service: Orthopedics;  Laterality: Right;  . MALONEY DILATION N/A 08/10/2014   Procedure: [name] DILATION;  Surgeon: Daneil Dolin, MD;  [facility];  Service: Endoscopy;  Laterality: N/A;  [name] DILATION N/A 11/19/2016   Procedure: [name] DILATION;  Surgeon: Daneil Dolin, MD;  [facility];  Service: Endoscopy;  Laterality: N/A;  . [name] DILATION N/A 02/08/2018   Procedure: [name] DILATION;  Surgeon: Daneil Dolin, MD;  [facility];  Service: Endoscopy;  Laterality: N/A;  [name] DILATION N/A 11/17/2018   Procedure: [name] DILATION;  Surgeon: Daneil Dolin, MD;  [facility];  Service: Endoscopy;  Laterality: N/A;  . SAVORY DILATION N/A 08/10/2014   Procedure: SAVORY DILATION;  Surgeon: Daneil Dolin, MD;  [facility];  Service: Endoscopy;  Laterality: N/A;  .  SHOULDER OPEN ROTATOR CUFF REPAIR     BILAT  . TONSILLECTOMY    . TOTAL KNEE ARTHROPLASTY Right 12/11/2015   Procedure: TOTAL KNEE ARTHROPLASTY;  Surgeon: Marchia Bond, MD;  Location: [place];  Service: Orthopedics;  Laterality: Right;     Family History: Family History  Problem  Relation Age of Onset  . Heart disease Mother   . Atrial fibrillation Sister   . Arrhythmia Brother   . Colon cancer Neg Hx      Social History: Neilah lives at home with her husband at home. 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  They live in a house that is 70 years old. They have carpeting throughout their mobile home. There is a cat inside of the home and a dog and cat outside of the home. There are no dust mite coverings on the bedding. There is tobacco exposure in the home (husband smokes). [name] smoked from 60 through 2014.    Review of Systems  Constitutional: Negative.  Negative for chills, fever, malaise/fatigue and weight loss.  HENT: Positive for congestion and sore throat. Negative for ear discharge and ear pain.        Positive for postnasal drip.  Eyes: Negative for pain, discharge and redness.  Respiratory: Positive for cough and wheezing. Negative for sputum production and shortness of breath.   Cardiovascular: Negative.  Negative for chest pain and palpitations.  Gastrointestinal: Negative for abdominal pain, heartburn, nausea and vomiting.  Skin: Negative.  Negative for itching and rash.  Neurological: Negative for dizziness and headaches.  Endo/Heme/Allergies: Negative for environmental allergies. Does not bruise/bleed easily.       Objective:   Blood pressure (!) 148/82, pulse 78, temperature 98.4 F (36.9 C), temperature source Oral, resp. rate 16, height 5' 4"  (1.626 m), weight 200 lb (90.7 kg), SpO2 94 %. Body mass index is 34.33 kg/m.   Physical Exam:   Physical Exam  Constitutional: She appears well-developed.  Pleasant.  Essential  tremors.  HENT:  Head: Normocephalic and atraumatic.  Right Ear: Tympanic membrane, external ear and ear canal normal. No drainage, swelling or tenderness. Tympanic membrane is not injected, not scarred, not erythematous, not retracted and not bulging.  Left Ear: Tympanic membrane, external ear and ear canal normal. No drainage, swelling or tenderness. Tympanic membrane is not injected, not scarred, not erythematous, not retracted and not bulging.  Nose: Mucosal edema and rhinorrhea present. No nasal deformity or septal deviation. No epistaxis. Right sinus exhibits no maxillary sinus tenderness and no frontal sinus tenderness. Left sinus exhibits no maxillary sinus tenderness and no frontal sinus tenderness.  Mouth/Throat: Uvula is midline and oropharynx is clear and moist. Mucous membranes are not pale and not dry.  Positive cobblestoning.  Eyes: Pupils are equal, round, and reactive to light. Conjunctivae and EOM are normal. Right eye exhibits no chemosis and no discharge. Left eye exhibits no chemosis and no discharge. Right conjunctiva is not injected. Left conjunctiva is not injected.  Allergic shiners bilaterally.  Cardiovascular: Normal rate, regular rhythm and normal heart sounds.  Respiratory: Effort normal and breath sounds normal. No accessory muscle usage. No tachypnea. No respiratory distress. She has no wheezes. She has no rhonchi. She has no rales. She exhibits no tenderness.  GI: There is no abdominal tenderness. There is no rebound and no guarding.  Lymphadenopathy:       Head (right side): No submandibular, no tonsillar and no occipital adenopathy present.       Head (left side): No submandibular, no tonsillar and no occipital adenopathy present.    She has no cervical adenopathy.  Neurological: She is alert.  Skin: No abrasion, no petechiae and no rash noted. Rash is not papular, not vesicular and not urticarial. No erythema. No pallor.  Psychiatric:  She has a normal mood and  affect.     Diagnostic studies:      Allergy Studies:    Airborne Adult Perc - 01/21/19 0931    Time Antigen Placed  0930    Allergen Manufacturer  Lavella Hammock    Location  Back    Number of Test  59    Panel 1  Select    1. Control-Buffer 50% Glycerol  Negative    2. Control-Histamine 1 mg/ml  2+    3. Albumin saline  Negative    4. [place]  Negative    5. [place]  Negative    6. [place]  Negative    7. [place] Blue  Negative    8. Meadow Fescue  Negative    9. Perennial Rye  Negative    10. Sweet Vernal  Negative    11. Timothy  Negative    12. Cocklebur  Negative    13. Burweed Marshelder  Negative    14. Ragweed, short  Negative    15. Ragweed, Giant  Negative    16. Plantain,  English  Negative    17. Lamb's Quarters  Negative    18. Sheep Sorrell  Negative    19. Rough Pigweed  Negative    20. Marsh Elder, Rough  Negative    21. Mugwort, Common  Negative    22. Ash mix  Negative    23. Birch mix  Negative    24. Beech American  Negative    25. Box, Elder  Negative    26. Cedar, red  2+    27. Cottonwood, [place]  Negative    28. Elm mix  Negative    29. Hickory mix  Negative    30. Maple mix  Negative    31. Oak, [place] mix  Negative    32. Pecan Pollen  Negative    33. Pine mix  Negative    34. Sycamore Eastern  Negative    35. Chauncey, Black Pollen  Negative    36. Alternaria alternata  Negative    37. Cladosporium Herbarum  Negative    38. Aspergillus mix  Negative    39. Penicillium mix  Negative    40. Bipolaris sorokiniana (Helminthosporium)  Negative    41. Drechslera spicifera (Curvularia)  Negative    42. Mucor plumbeus  Negative    43. Fusarium moniliforme  Negative    44. Aureobasidium pullulans (pullulara)  Negative    45. Rhizopus oryzae  Negative    46. Botrytis cinera  Negative    47. Epicoccum nigrum  Negative    48. Phoma betae  Negative    49. Candida Albicans  Negative    50. Trichophyton mentagrophytes  Negative    51. Mite, D  Farinae  5,000 AU/ml  Negative    52. Mite, D Pteronyssinus  5,000 AU/ml  Negative    53. Cat Hair 10,000 BAU/ml  3+    54.  Dog Epithelia  Negative    55. Mixed Feathers  Negative    56. Horse Epithelia  Negative    57. Cockroach, German  Negative    58. Mouse  Negative    59. Tobacco Leaf  Negative     Intradermal - 01/21/19 0957    Time Antigen Placed  1856    Allergen Manufacturer  Lavella Hammock    Location  Arm    Number of Test  14    Intradermal  Select    Control  Negative    [place]  2+    Johnson  Negative    7 Grass  Negative    Ragweed mix  1+    Weed mix  1+    Tree mix  3+    Mold 1  Negative    Mold 2  2+    Mold 3  2+    Mold 4  Negative    Dog  4+    Cockroach  Negative    Mite mix  Negative     Food Adult Perc - 01/21/19 0900    Time Antigen Placed  0930    Allergen Manufacturer  Lavella Hammock    Location  Back    Number of allergen test  3    Panel 2  Select    Control-Histamine 1 mg/ml  2+    38. [place] Meat  Negative    39. Chicken Meat  Negative       Allergy testing results were read and interpreted by myself, documented by clinical staff.         Salvatore Marvel, MD [facility]

## 2019-01-25 DIAGNOSIS — L570 Actinic keratosis: Secondary | ICD-10-CM | POA: Diagnosis not present

## 2019-01-25 DIAGNOSIS — L818 Other specified disorders of pigmentation: Secondary | ICD-10-CM | POA: Diagnosis not present

## 2019-01-25 DIAGNOSIS — D1801 Hemangioma of skin and subcutaneous tissue: Secondary | ICD-10-CM | POA: Diagnosis not present

## 2019-02-08 ENCOUNTER — Other Ambulatory Visit: Payer: Self-pay | Admitting: Cardiology

## 2019-02-18 ENCOUNTER — Ambulatory Visit (INDEPENDENT_AMBULATORY_CARE_PROVIDER_SITE_OTHER): Payer: Medicare Other | Admitting: Allergy & Immunology

## 2019-02-18 ENCOUNTER — Encounter: Payer: Self-pay | Admitting: Allergy & Immunology

## 2019-02-18 ENCOUNTER — Other Ambulatory Visit: Payer: Self-pay

## 2019-02-18 VITALS — BP 126/90 | HR 75 | Temp 96.7°F | Resp 18

## 2019-02-18 DIAGNOSIS — J302 Other seasonal allergic rhinitis: Secondary | ICD-10-CM | POA: Diagnosis not present

## 2019-02-18 DIAGNOSIS — J449 Chronic obstructive pulmonary disease, unspecified: Secondary | ICD-10-CM

## 2019-02-18 DIAGNOSIS — J3089 Other allergic rhinitis: Secondary | ICD-10-CM

## 2019-02-18 MED ORDER — EPINEPHRINE 0.3 MG/0.3ML IJ SOAJ: 0.3000 mg | Freq: Once | INTRAMUSCULAR | 2 refills | Status: AC

## 2019-02-18 MED ORDER — FLUTICASONE-SALMETEROL 500-50 MCG/DOSE IN AEPB: 1.0000 | INHALATION_SPRAY | Freq: Two times a day (BID) | RESPIRATORY_TRACT | 5 refills | Status: DC

## 2019-02-18 NOTE — Patient Instructions (Addendum)
1. Asthma with COPD overlap syndrome - Lung testing looks fairly good today. - We are going to increase to the higher dose Advair to see if this provides better relief. - Monitor your albuterol use and episodes of wheezing/coughing for the next appointment to see if this change makes any difference.  - Daily controller medication(s): Advair 500/60mg one puff twice daily - Prior to physical activity: albuterol 2 puffs 10-15 minutes before physical activity. - Rescue medications: albuterol 4 puffs every 4-6 hours as needed - Asthma control goals:  * Full participation in all desired activities (may need albuterol before activity) * Albuterol use two time or less a week on average (not counting use with activity) * Cough interfering with sleep two time or less a month * Oral steroids no more than once a year * No hospitalizations  2. Perennial and seasonal allergic rhinitis (grasses, ragweed, weeds, trees, indoor molds, outdoor molds, cat and dog) - Since you are continuing to have symptoms despite the medications, we are going to go ahead and start allergy shots. - Consent signed. - EpiPen script sent in.  - Make an appointment in two weeks to start your allergy shots.  - Stop taking: Xyzal - Continue with: Allegra (fexofenadine) 1865mtable once daily, Nasacort (triamcinolone) two sprays per nostril daily and Astelin (azelastine) 2 sprays per nostril 1-2 times daily as needed  3. Return in about 3 months (around 05/21/2019). This can be an in-person, a virtual Webex or a telephone follow up visit.   Please inform usKoreaf any Emergency Department visits, hospitalizations, or changes in symptoms. Call usKoreaefore going to the ED for breathing or allergy symptoms since we might be able to fit you in for a sick visit. Feel free to contact usKoreanytime with any questions, problems, or concerns.  It was a pleasure to see you again today!  Websites that have reliable patient information: 1. American  Academy of Asthma, Allergy, and Immunology: www.aaaai.org 2. Food Allergy Research and Education (FARE): foodallergy.org 3. Mothers of Asthmatics: http://www.asthmacommunitynetwork.org 4. American College of Allergy, Asthma, and Immunology: www.acaai.org  "Like" usKorean Facebook and Instagram for our latest updates!      Make sure you are registered to vote! If you have moved or changed any of your contact information, you will need to get this updated before voting!    Voter ID laws are NOT going into effect for the General Election in November 2020! DO NOT let this stop you from exercising your right to vote!

## 2019-02-18 NOTE — Progress Notes (Addendum)
FOLLOW UP  Date of Service/Encounter:  02/18/19   Assessment:   Seasonal and perennial allergic rhinitis (grasses, ragweed, weeds, trees, indoor molds, outdoor molds, cat and dog)  Asthma with COPD overlap - not well controlled with frequent symptoms and use of the albuterol rescue inhaler   [name] presents for follow-up visit.  While her allergic rhinitis is better controlled, she continues to avoid outdoors due to symptoms.  She would like to spend more time outdoors and since we are maximized on her medications, I think allergy shots at the next best thing to do.  This will also help control her breathing.  Regarding her breathing, her spirometry is slightly restrictive today. We are not going away too much about it, but this will act as a nice baseline for future tests.  In the meantime, we are going to increase her Advair to the higher strength dose.   Plan/Recommendations:   1. Asthma with COPD overlap syndrome - Lung testing looks fairly good today. - We are going to increase to the higher dose Advair to see if this provides better relief. - Monitor your albuterol use and episodes of wheezing/coughing for the next appointment to see if this change makes any difference.  - Daily controller medication(s): Advair 500/6mg one puff twice daily - Prior to physical activity: albuterol 2 puffs 10-15 minutes before physical activity. - Rescue medications: albuterol 4 puffs every 4-6 hours as needed - Asthma control goals:  * Full participation in all desired activities (may need albuterol before activity) * Albuterol use two time or less a week on average (not counting use with activity) * Cough interfering with sleep two time or less a month * Oral steroids no more than once a year * No hospitalizations  2. Perennial and seasonal allergic rhinitis (grasses, ragweed, weeds, trees, indoor molds, outdoor molds, cat and dog) - Since you are continuing to have symptoms despite  the medications, we are going to go ahead and start allergy shots. - Consent signed. - EpiPen script sent in.  - Make an appointment in two weeks to start your allergy shots.  - Stop taking: Xyzal - Continue with: Allegra (fexofenadine) 1827mtable once daily, Nasacort (triamcinolone) two sprays per nostril daily and Astelin (azelastine) 2 sprays per nostril 1-2 times daily as needed  3. Return in about 3 months (around 05/21/2019). This can be an in-person, a virtual Webex or a telephone follow up visit.  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  [name] presenting for a follow up visit. She was last seen one month ago. At that time, her breathing was controlled with the use of Advair 250/50 one puff BID  as well as albuterol as needed. She had testing that was positive to grasses, ragweed, weeds, trees, indoor and outdoor molds, cat, and dog. We stopped her cetirizine and ipratropium and started Astelin, Xyzal, and Nasacort. We did discuss allergy shots as well.   Since the last visit, she has done fairly well. She has been somewhat better. She cannot tolerate the Xyzal due to scary dreams.  She has changed  back to Zyrtec instead.  She has been on Allegra in the past but felt that Zyrtec worked better.  She has been on Clarinex and Claritin in the past, but these did not work at all.  She is using the Nasacort and the Astelin as recommended.  She feels that these are working better, but she continues to avoid the outdoors due to symptoms when she steps outside.  She did want to discuss allergen immunotherapy today.  She has never seen an otolaryngologist.  She remains on Advair 250/50 1 puff twice daily.  This is managed by her primary care provider.  She has never seen a pulmonologist.  She has been using her rescue inhaler more often, up to 3-4 times a week.  She also has more shortness of breath.  She sleeps fairly well for the most part.  She has not required any prednisone or emergency room visits since the last time we saw her. However she does tell me that she routinely gets prednisone 2-4 times per year for her breathing.   Otherwise, there have been no changes to her past medical history, surgical history, family history, or social history.    Review of Systems  Constitutional: Negative.  Negative for chills, fever, malaise/fatigue and weight loss.  HENT: Positive for congestion and sinus pain. Negative for ear discharge and ear pain.   Eyes: Negative for pain, discharge and redness.  Respiratory: Positive for cough. Negative for sputum production, shortness of breath and wheezing.   Cardiovascular: Negative.  Negative for chest pain and palpitations.  Gastrointestinal: Negative for abdominal pain, heartburn, nausea and vomiting.  Skin: Negative.  Negative for itching and rash.  Neurological: Positive for headaches. Negative for dizziness.  Endo/Heme/Allergies: Negative for environmental allergies. Does not bruise/bleed easily.       Objective:   Blood pressure 126/90, pulse 75, temperature (!) 96.7 F (35.9 C), temperature source Temporal, resp. rate 18, SpO2 93 %. There is no height  or weight on file to calculate BMI.   Physical Exam:  Physical Exam  Constitutional: She appears well-developed.  HENT:  Head: Normocephalic and atraumatic.  Right Ear: Tympanic membrane, external ear and ear canal normal.  Left Ear: Tympanic membrane, external ear and ear canal normal.  Nose: Mucosal edema and rhinorrhea present. No nasal deformity or septal deviation. No epistaxis. Right sinus exhibits no maxillary sinus tenderness and no frontal sinus tenderness. Left sinus exhibits no maxillary sinus tenderness and no frontal sinus tenderness.  Mouth/Throat: Uvula is midline and oropharynx is clear and moist. Mucous membranes are not pale and not dry.  Eyes: Pupils are equal, round, and reactive to light. Conjunctivae and EOM are normal. Right eye exhibits no chemosis and no discharge. Left eye exhibits no chemosis and no discharge. Right conjunctiva is not injected. Left conjunctiva is not injected.  Cardiovascular: Normal rate, regular rhythm and normal heart sounds.  Respiratory: Effort normal and breath sounds normal. No accessory muscle usage. No tachypnea. No respiratory distress. She has no wheezes. She has no rhonchi. She has no rales. She exhibits no  tenderness.  No increased work of breathing.  Somewhat decreased air movement at the bases.  Lymphadenopathy:    She has no cervical adenopathy.  Neurological: She is alert.  Skin: No abrasion, no petechiae and no rash noted. Rash is not papular, not vesicular and not urticarial. No erythema. No pallor.  Psychiatric: She has a normal mood and affect.     Diagnostic studies:    Spirometry: results abnormal (FEV1: 1.57/77%, FVC: 1.98/69%, FEV1/FVC: 79%).    Spirometry consistent with possible restrictive disease.     Allergy Studies: none     Salvatore Marvel, MD  [facility] [place] [facility] [place]

## 2019-02-22 DIAGNOSIS — J301 Allergic rhinitis due to pollen: Secondary | ICD-10-CM | POA: Diagnosis not present

## 2019-02-22 NOTE — Progress Notes (Signed)
VIALS EXP 02-22-2020

## 2019-02-23 DIAGNOSIS — J3089 Other allergic rhinitis: Secondary | ICD-10-CM | POA: Diagnosis not present

## 2019-02-25 ENCOUNTER — Other Ambulatory Visit: Payer: Self-pay

## 2019-02-25 MED ORDER — ELIQUIS 5 MG PO TABS: 5.0000 mg | ORAL_TABLET | Freq: Two times a day (BID) | ORAL | 3 refills | Status: AC

## 2019-02-25 NOTE — Telephone Encounter (Signed)
refilled eliquis

## 2019-03-03 DIAGNOSIS — R011 Cardiac murmur, unspecified: Secondary | ICD-10-CM | POA: Diagnosis not present

## 2019-03-03 DIAGNOSIS — N189 Chronic kidney disease, unspecified: Secondary | ICD-10-CM | POA: Diagnosis not present

## 2019-03-03 DIAGNOSIS — E7801 Familial hypercholesterolemia: Secondary | ICD-10-CM | POA: Diagnosis not present

## 2019-03-03 DIAGNOSIS — E782 Mixed hyperlipidemia: Secondary | ICD-10-CM | POA: Diagnosis not present

## 2019-03-03 DIAGNOSIS — E1165 Type 2 diabetes mellitus with hyperglycemia: Secondary | ICD-10-CM | POA: Diagnosis not present

## 2019-03-03 DIAGNOSIS — K219 Gastro-esophageal reflux disease without esophagitis: Secondary | ICD-10-CM | POA: Diagnosis not present

## 2019-03-03 DIAGNOSIS — E7800 Pure hypercholesterolemia, unspecified: Secondary | ICD-10-CM | POA: Diagnosis not present

## 2019-03-08 DIAGNOSIS — J4520 Mild intermittent asthma, uncomplicated: Secondary | ICD-10-CM | POA: Diagnosis not present

## 2019-03-08 DIAGNOSIS — K5190 Ulcerative colitis, unspecified, without complications: Secondary | ICD-10-CM | POA: Diagnosis not present

## 2019-03-08 DIAGNOSIS — Z6834 Body mass index (BMI) 34.0-34.9, adult: Secondary | ICD-10-CM | POA: Diagnosis not present

## 2019-03-08 DIAGNOSIS — I4891 Unspecified atrial fibrillation: Secondary | ICD-10-CM | POA: Diagnosis not present

## 2019-03-08 DIAGNOSIS — N189 Chronic kidney disease, unspecified: Secondary | ICD-10-CM | POA: Diagnosis not present

## 2019-03-08 DIAGNOSIS — G4700 Insomnia, unspecified: Secondary | ICD-10-CM | POA: Diagnosis not present

## 2019-03-08 DIAGNOSIS — E119 Type 2 diabetes mellitus without complications: Secondary | ICD-10-CM | POA: Diagnosis not present

## 2019-03-08 DIAGNOSIS — E782 Mixed hyperlipidemia: Secondary | ICD-10-CM | POA: Diagnosis not present

## 2019-03-09 ENCOUNTER — Ambulatory Visit (INDEPENDENT_AMBULATORY_CARE_PROVIDER_SITE_OTHER): Payer: Medicare Other

## 2019-03-09 DIAGNOSIS — J302 Other seasonal allergic rhinitis: Secondary | ICD-10-CM

## 2019-03-09 DIAGNOSIS — J3089 Other allergic rhinitis: Secondary | ICD-10-CM

## 2019-03-09 NOTE — Progress Notes (Signed)
Immunotherapy   Patient Details  Name: SABIRIN BARAY MRN: [id number] Date of Birth: [birth year]  03/09/2019  [name] started injections for  G-W-T-C-D & MOLDS-RW Following schedule: B  Frequency:1 time per week Epi-Pen: Adrenaclick available. Patient did demonstrate knowledge of administration. Consent signed and patient instructions given. Patient waited 30 minutes post injection with no local or systemic reactions.   Lonn Georgia I Greycen Felter 03/09/2019, 11:05 AM

## 2019-03-10 ENCOUNTER — Encounter: Payer: Self-pay | Admitting: Nurse Practitioner

## 2019-03-10 ENCOUNTER — Ambulatory Visit (INDEPENDENT_AMBULATORY_CARE_PROVIDER_SITE_OTHER): Payer: Medicare Other | Admitting: Nurse Practitioner

## 2019-03-10 ENCOUNTER — Other Ambulatory Visit: Payer: Self-pay

## 2019-03-10 ENCOUNTER — Telehealth: Payer: Self-pay | Admitting: *Deleted

## 2019-03-10 DIAGNOSIS — K5100 Ulcerative (chronic) pancolitis without complications: Secondary | ICD-10-CM

## 2019-03-10 DIAGNOSIS — R197 Diarrhea, unspecified: Secondary | ICD-10-CM | POA: Diagnosis not present

## 2019-03-10 NOTE — Telephone Encounter (Signed)
Left a detailed message for pt. Pt is aware.

## 2019-03-10 NOTE — Patient Instructions (Signed)
Your health issues we discussed today were:   Abdominal pain, nausea/vomiting, diarrhea: 1. Have your labs checked when you are able to. 2. Continue current medications. 3. You can add Imodium to help with your diarrhea.  Follow the instructions on the bottle. 4. Call for any worsening or severe symptoms  Overall I recommend:  1. Further recommendations to follow your lab results 2. Return for follow-up in 2 months 3. Call us if you have any questions or concerns   Because of recent events of COVID-19 ("Coronavirus"), follow CDC recommendations:  1. Wash your hand frequently 2. Avoid touching your face 3. Stay away from people who are sick 4. If you have symptoms such as fever, cough, shortness of breath then call your healthcare provider for further guidance 5. If you are sick, STAY AT HOME unless otherwise directed by your healthcare provider. 6. Follow directions from state and national officials regarding staying safe   At [facility] we value your feedback. You may receive a survey about your visit today. Please share your experience as we strive to create trusting relationships with our patients to provide genuine, compassionate, quality care.  We appreciate your understanding and patience as we review any laboratory studies, imaging, and other diagnostic tests that are ordered as we care for you. Our office policy is 5 business days for review of these results, and any emergent or urgent results are addressed in a timely manner for your best interest. If you do not hear from our office in 1 week, please contact us.   We also encourage the use of MyChart, which contains your medical information for your review as well. If you are not enrolled in this feature, an access code is on this after visit summary for your convenience. Thank you for allowing [place] to be involved in your care.  It was great to see you today!  I hope you have a great summer!!

## 2019-03-10 NOTE — Assessment & Plan Note (Signed)
History of ulcerative colitis which is historically been well controlled on Colazal the addition of Bentyl.  About 3 days ago she began having an uptake in her diarrhea along with abdominal pain, nausea, vomiting.  Denies any hematochezia.  Differentials include flare of UC, worsening IBS, self-limiting gastroenteritis.  At this point to check her UC status get labs including CBC, CMP, CRP, ESR.  Historically her CRP and ESR have been normal which will help establish whether she is having a flare.  Continue her current medications in the office.  Follow-up in 2 months.

## 2019-03-10 NOTE — Telephone Encounter (Signed)
Spoke with EG, pt isn't suppose to have stool studies at this time.

## 2019-03-10 NOTE — Telephone Encounter (Signed)
Patient went to have lab work done. She thought that eric wanted her to have a stool test done as well. I do not see any mention in her OV note for a stool test. Please advise thanks

## 2019-03-10 NOTE — Assessment & Plan Note (Signed)
The patient is having acute on chronic diarrhea with history of UC, IBS.  Previously has historically been well controlled as per above.  Worsening diarrhea despite Bentyl.  Probiotic previously helped but is not helping.  She can add Imodium as needed to help control her diarrhea while we evaluate for further etiology.  There is a possibility of self-limiting gastroenteritis although she denies sick contacts.  No recent healthcare antibiotic exposure.  Follow-up in 2 months.  Call for worsening symptoms.

## 2019-03-10 NOTE — Progress Notes (Signed)
Referring Provider: Riley Lam Primary Care Physician:  Jalene Mullet, PA-C Primary GI:  Dr. Gala Romney  Chief Complaint  Patient presents with  . Follow-up    Diarrhea,IBS,UC    HPI:   [name] is a 68 y.o. female who presents for follow-up on UC and diarrhea.  The patient was last seen in our office 11/08/2018 for the same as well as dysphasia.  Noted history of pan ulcerative colitis.  At one point she had an improvement in diarrhea due to drug holiday from metformin.  She does have chronic history of IBS.  At her last visit she was doing okay with improved diarrhea now not occurring every day.  Happens typically 2-3 times a week but denies hematochezia or melena.  Dysphagia began about a month prior with a history of Schatzki's ring.  Rare GERD symptoms after dietary discretion.  No other GI complaints.  Recommended EGD with possible dilation.  Overall ulcerative colitis seems to be well managed on call us on Bentyl and recommend she continue to use.  Continue Nexium, follow-up in 4 months.  EGD completed 11/17/2018 which found moderate Schatzki's ring status post disruption and dilation, esophageal plaques status post brushing, large hiatal hernia, otherwise normal.  Esophageal brushings found few budding yeast.  Recommended Rx for Diflucan 200 mg initially then 100 mg a day for 21 days with no refills.  Today she states she's doing ok overall. Has been staying at home with COVID-19 pandemic. Has had diarrhea the last 3 days with about 4-5 stools a day. Still taking bentyl about 4 times a day. No recent sick contacts. Has had some nausea on Sunday and vomited 2-3 times which isn't normal from her. Stress level stable/not too bad. Has had some worsening abdominal pain that started 3 days ago. Pain sometimes improves after bowel movement. Denies obvious hematochezia, melena. Denies fever, chills, unintentional weight loss. No recent sick contacts, recent antibiotics, recent hospital  exposure. GERD well managed on PPI.  Denies URI or flu-like symptoms. Denies loss of sense of taste or smell. Denies chest pain, dyspnea, dizziness, lightheadedness, syncope, near syncope. Denies any other upper or lower GI symptoms.  Past Medical History:  Diagnosis Date  . Anemia   . Anginal pain ([place])    PATIENT STATES DOCT THINKS IS FIBROMYALGIA  . Arthritis   . Asthma   . Atrial fibrillation ([place])   . Blood clot in lung last march  . 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 Chronic back pain   . COPD (chronic obstructive pulmonary disease) ([place])   . DDD (degenerative disc disease)   . Degenerative tear of posterior horn of lateral meniscus of right knee 01/27/2014  . Depression   . Diabetes mellitus without complication ([place])   . Diverticulitis   . Fibromyalgia   . GERD (gastroesophageal reflux disease)   . Headache    HX MIGRAINES    . Hiatal hernia   . HOH (hard of hearing)    right  . Pancreatitis   . Pneumonia    1 YR  . PONV (postoperative nausea and vomiting)    only happened after knee arthroscopy  . Primary localized osteoarthritis of right knee 12/11/2015  . PVC's (premature ventricular contractions)   . Schatzki's ring   . Seizures ([place])    had febrile seizures as child; none since childhood and on no meds  . Shortness of breath dyspnea    WITH EXERTION   .  Sleep apnea    CPAP at night  . Ulcerative colitis 2007  . Wears dentures    full top-partial bottom    Past Surgical History:  Procedure Laterality Date  . arch and foot    . BALLOON DILATION  11/17/2018   Procedure: BALLOON DILATION;  Surgeon: Daneil Dolin, MD;  [facility];  Service: Endoscopy;;  esophagus  . CERVICAL CONE BIOPSY    . CHOLECYSTECTOMY    . COLONOSCOPY  12/03/05   Rourk-marked inflammatory changes of the rectum, left colon, pan colitis, internal hemorrhoids, sigmoid diverticula  . COLONOSCOPY N/A 10/20/2013   Dr. Rourk:Colonic diverticulosis. colonic mucosal ulcerations  involving segment of sigmoid colon, atypical for UC. sigmoid colon path with chronic mildly active colitis consistent with IBD, other biopsies including ascending, transverse, descending colon and rectum unremarkable.   . COLONOSCOPY N/A 11/19/2016   Dr. Gala Romney: Diverticulosis, 7 millimeter polyp removed from the descending colon, internal hemorrhoids, segmental biopsies unremarkable.  Marland Kitchen DILATION AND CURETTAGE OF UTERUS    . ESOPHAGEAL DILATION N/A 09/20/2015   Procedure: ESOPHAGEAL DILATION;  Surgeon: Daneil Dolin, MD;  [facility];  Service: Endoscopy;  Laterality: N/A;  . ESOPHAGOGASTRODUODENOSCOPY  01/28/2011   Rourk- Schatzki ring, s/p 67F, otherwise normal/Hiatal hernia otherwise normal stomach D1 and D2  . ESOPHAGOGASTRODUODENOSCOPY  11/30/08   Rourk-Schatzki's ring status post dilation, hiatal hernia  . ESOPHAGOGASTRODUODENOSCOPY  07/12/2007   Dilation with 56 French, Schatzki's ring  . ESOPHAGOGASTRODUODENOSCOPY N/A 08/10/2014   Dr.Rourk- prominent schatzki's ring s/p maloney dilation and disruption, moderate sized hiatal hernia  . ESOPHAGOGASTRODUODENOSCOPY N/A 09/20/2015   RMR: Schatzki's ring status post dilation as described above. Rather large hiatal hernia; somewhat baggy atonic esophageal body' otherwise normal EGD  . ESOPHAGOGASTRODUODENOSCOPY N/A 11/19/2016   Dr. Gala Romney: moderate Schatzki ring s/p dirsuption with biopsy forceps, large hiatal hernia  . ESOPHAGOGASTRODUODENOSCOPY N/A 11/17/2018   Procedure: ESOPHAGOGASTRODUODENOSCOPY (EGD);  Surgeon: Daneil Dolin, MD;  [facility];  Service: Endoscopy;  Laterality: N/A;  12:00PM  . ESOPHAGOGASTRODUODENOSCOPY (EGD) WITH ESOPHAGEAL DILATION  09/01/2012   Dr. Gala Romney- schatzki's ring, hiatal hernia  . ESOPHAGOGASTRODUODENOSCOPY (EGD) WITH PROPOFOL N/A 02/08/2018   Procedure: ESOPHAGOGASTRODUODENOSCOPY (EGD) WITH PROPOFOL;  Surgeon: Daneil Dolin, MD;  [facility];  Service: Endoscopy;  Laterality:  N/A;  10:30am  . FOOT ARTHRODESIS  [phone]   both feet  . HERNIA REPAIR     umbilical  . JOINT REPLACEMENT Right   . KNEE ARTHROSCOPY WITH LATERAL MENISECTOMY Right 01/27/2014   Procedure: RIGHT KNEE ARTHROSCOPY WITH PARTIAL LATERAL MENISECTOMY, ;  Surgeon: Johnny Bridge, MD;  Location: [facility];  Service: Orthopedics;  Laterality: Right;  . MALONEY DILATION N/A 08/10/2014   Procedure: [name] DILATION;  Surgeon: Daneil Dolin, MD;  [facility];  Service: Endoscopy;  Laterality: N/A;  [name] DILATION N/A 11/19/2016   Procedure: [name] DILATION;  Surgeon: Daneil Dolin, MD;  [facility];  Service: Endoscopy;  Laterality: N/A;  . [name] DILATION N/A 02/08/2018   Procedure: [name] DILATION;  Surgeon: Daneil Dolin, MD;  [facility];  Service: Endoscopy;  Laterality: N/A;  [name] DILATION N/A 11/17/2018   Procedure: [name] DILATION;  Surgeon: Daneil Dolin, MD;  [facility];  Service: Endoscopy;  Laterality: N/A;  . SAVORY DILATION N/A 08/10/2014   Procedure: SAVORY DILATION;  Surgeon: Daneil Dolin, MD;  [facility];  Service:  Endoscopy;  Laterality: N/A;  . SHOULDER OPEN ROTATOR CUFF REPAIR     BILAT  . TONSILLECTOMY    . TOTAL KNEE ARTHROPLASTY Right 12/11/2015   Procedure: TOTAL KNEE ARTHROPLASTY;  Surgeon: Marchia Bond, MD;  Location: [facility];  Service: Orthopedics;  Laterality: Right;    Current Outpatient Medications  Medication Sig Dispense Refill  . acetaminophen (TYLENOL) 500 MG tablet Take 1,000 mg by mouth every 6 (six) hours as needed for moderate pain or headache.    . albuterol (PROVENTIL) (2.5 MG/3ML) 0.083% nebulizer solution Take 2.5 mg by nebulization every 6 (six) hours as needed for wheezing or shortness of breath.    Marland Kitchen albuterol (PROVENTIL,VENTOLIN) 90 MCG/ACT inhaler Inhale 2 puffs into the lungs every 6 (six) hours as needed for wheezing or shortness of breath.     Marland Kitchen azelastine  (ASTELIN) 137 MCG/SPRAY nasal spray 2 sprays by Nasal route 2 (two) times daily. Use in each nostril as directed     . buPROPion (WELLBUTRIN XL) 150 MG 24 hr tablet Take 150 mg by mouth daily.    . busPIRone (BUSPAR) 15 MG tablet Take 7.5 mg by mouth 2 (two) times daily.    . Calcium Carb-Cholecalciferol (CALCIUM-VITAMIN D) 600-400 MG-UNIT TABS Take 1 tablet by mouth 2 (two) times daily.    . cetirizine (ZYRTEC) 10 MG tablet Take 10 mg by mouth daily.    Marland Kitchen COLAZAL 750 MG capsule TAKE 3 CAPSULES TWICE A DAY (Patient taking differently: Take 2,250 mg by mouth 2 (two) times daily. ) 540 capsule 4  . dicyclomine (BENTYL) 10 MG capsule TAKE 1 CAPSULE FOUR TIMES A DAY BEFORE MEALS AND AT BEDTIME (Patient taking differently: Take 10 mg by mouth 4 (four) times daily. ) 360 capsule 4  . DULoxetine (CYMBALTA) 30 MG capsule Take 120 mg by mouth daily.     Marland Kitchen ELIQUIS 5 MG TABS tablet Take 1 tablet (5 mg total) by mouth 2 (two) times daily. 180 tablet 3  . esomeprazole (NEXIUM) 40 MG capsule Take 1 capsule (40 mg total) by mouth 2 (two) times daily before a meal. 90 capsule 3  . famotidine-calcium carbonate-magnesium hydroxide (PEPCID COMPLETE) 10-800-165 MG CHEW chewable tablet Chew 1 tablet by mouth daily as needed (for breakthrough GERD).     . ferrous sulfate 325 (65 FE) MG tablet Take 325 mg by mouth daily with breakfast.    . fluticasone (FLONASE) 50 MCG/ACT nasal spray Place 2 sprays into both nostrils daily. 16 g 5  . Fluticasone-Salmeterol (ADVAIR DISKUS) 500-50 MCG/DOSE AEPB Inhale 1 puff into the lungs 2 (two) times daily. 1 each 5  . FREESTYLE LITE test strip     . ketotifen (ALAWAY) 0.025 % ophthalmic solution Place 1 drop into both eyes daily.     Marland Kitchen lidocaine (LIDODERM) 5 % Place 1-2 patches onto the skin daily as needed (pain). Remove & Discard patch within 12 hours or as directed by MD    . methylcellulose (CITRUCEL) oral powder Take 1 packet by mouth daily as needed (constipation).     .  metoprolol tartrate (LOPRESSOR) 25 MG tablet TAKE ONE-HALF (1/2) TABLET TWICE A DAY 90 tablet 3  . pravastatin (PRAVACHOL) 20 MG tablet Take 1 tablet (20 mg total) by mouth daily. (Patient taking differently: Take 20 mg by mouth daily with supper. ) 90 tablet 1  . pregabalin (LYRICA) 100 MG capsule Take 100 mg by mouth 2 (two) times daily.    . Probiotic CAPS Take 1 capsule by  mouth daily.    . traZODone (DESYREL) 100 MG tablet Take 100 mg by mouth at bedtime.       No current facility-administered medications for this visit.     Allergies as of 03/10/2019 - Review Complete 03/10/2019  Allergen Reaction Noted  . Mesalamine Other (See Comments)   . Cefprozil Other (See Comments)   . Cefuroxime axetil Other (See Comments) 08/08/2008  . Morphine and related Hives and Itching 08/23/2012  . Penicillins Hives 08/08/2008  . Aspirin Other (See Comments)   . Chicken allergy Nausea And Vomiting and Rash 11/27/2015  . Clarithromycin Nausea And Vomiting 08/08/2008  . Codeine Other (See Comments)   . Entex Other (See Comments) 08/08/2008    Family History  Problem Relation Age of Onset  . Heart disease Mother   . Atrial fibrillation Sister   . Arrhythmia Brother   . Colon cancer Neg Hx     Social History   Socioeconomic History  . Marital status: Married    Spouse name: Not on file  . Number of children: 3  . Years of education: Not on file  . Highest education level: Not on file  Occupational History  . 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 Financial resource strain: Not on file  . Food insecurity    Worry: Not on file    Inability: Not on file  . Transportation needs    Medical: Not on file    Non-medical: Not on file  Tobacco Use  . Smoking status: Former Smoker    Packs/day: 1.00    Years: 30.00    Pack years: 30.00    Types: Cigarettes    Start date: 07/07/1969    Quit date: 08/30/2011    Years since quitting: 7.5  . Smokeless tobacco: Never Used   Substance and Sexual Activity  . Alcohol use: No    Alcohol/week: 0.0 standard drinks  . Drug use: No  . Sexual activity: Yes    Partners: Male    Birth control/protection: None    Comment: spouse  Lifestyle  . Physical activity    Days per week: Not on file    Minutes per session: Not on file  . Stress: Not on file  Relationships  . Social Herbalist on phone: Not on file    Gets together: Not on file    Attends religious service: Not on file    Active member of club or organization: Not on file    Attends meetings of clubs or organizations: Not on file    Relationship status: Not on file  Other Topics Concern  . Not on file  Social History Narrative  . Not on file    Review of Systems: General: Negative for anorexia, weight loss, fever, chills, fatigue, weakness. ENT: Negative for hoarseness, difficulty swallowing. CV: Negative for chest pain, angina, palpitations, peripheral edema.  Respiratory: Negative for dyspnea at rest, cough, sputum, wheezing.  GI: See history of present illness. Endo: Negative for unusual weight change.  Heme: Negative for bruising or bleeding. Allergy: Negative for rash or hives.   Physical Exam: BP 118/69   Pulse 66   Temp 97.7 F (36.5 C)   Ht 5' 4"  (1.626 m)   Wt 201 lb (91.2 kg)   BMI 34.50 kg/m  General:   Alert and oriented. Pleasant and cooperative. Well-nourished and well-developed.  Head:  Normocephalic and atraumatic. Eyes:  Without icterus, sclera clear and conjunctiva pink.  Ears:  Normal auditory acuity. Cardiovascular:  S1, S2 present without murmurs appreciated. Extremities without clubbing or edema. Respiratory:  Clear to auscultation bilaterally. No wheezes, rales, or rhonchi. No distress.  Gastrointestinal:  +BS, soft, non-tender and non-distended. No HSM noted. No guarding or rebound. No masses appreciated.  Rectal:  Deferred  Musculoskalatal:  Symmetrical without gross deformities. Neurologic:  Alert  and oriented x4;  grossly normal neurologically. Psych:  Alert and cooperative. Normal mood and affect. Heme/Lymph/Immune: No excessive bruising noted.    03/10/2019 11:02 AM   Disclaimer: This note was dictated with voice recognition software. Similar sounding words can inadvertently be transcribed and may not be corrected upon review.

## 2019-03-10 NOTE — Addendum Note (Signed)
Addended by: Gordy Levan, ERIC A on: 03/10/2019 11:26 AM   Modules accepted: Orders

## 2019-03-11 LAB — COMPREHENSIVE METABOLIC PANEL
AG Ratio: 1.6 (calc) (ref 1.0–2.5)
ALT: 7 U/L (ref 6–29)
AST: 10 U/L (ref 10–35)
Albumin: 4.1 g/dL (ref 3.6–5.1)
Alkaline phosphatase (APISO): 70 U/L (ref 37–153)
BUN: 12 mg/dL (ref 7–25)
CO2: 29 mmol/L (ref 20–32)
Calcium: 9.6 mg/dL (ref 8.6–10.4)
Chloride: 99 mmol/L (ref 98–110)
Creat: 0.9 mg/dL (ref 0.50–0.99)
Globulin: 2.6 g/dL (calc) (ref 1.9–3.7)
Glucose, Bld: 113 mg/dL (ref 65–139)
Potassium: 4.3 mmol/L (ref 3.5–5.3)
Sodium: 137 mmol/L (ref 135–146)
Total Bilirubin: 0.4 mg/dL (ref 0.2–1.2)
Total Protein: 6.7 g/dL (ref 6.1–8.1)

## 2019-03-11 LAB — CBC WITH DIFFERENTIAL/PLATELET
Absolute Monocytes: 646 cells/uL (ref 200–950)
Basophils Absolute: 71 cells/uL (ref 0–200)
Basophils Relative: 0.7 %
Eosinophils Absolute: 253 cells/uL (ref 15–500)
Eosinophils Relative: 2.5 %
HCT: 37.5 % (ref 35.0–45.0)
Hemoglobin: 12.6 g/dL (ref 11.7–15.5)
Lymphs Abs: 2232 cells/uL (ref 850–3900)
MCH: 30.4 pg (ref 27.0–33.0)
MCHC: 33.6 g/dL (ref 32.0–36.0)
MCV: 90.6 fL (ref 80.0–100.0)
MPV: 11.4 fL (ref 7.5–12.5)
Monocytes Relative: 6.4 %
Neutro Abs: 6898 cells/uL (ref 1500–7800)
Neutrophils Relative %: 68.3 %
Platelets: 244 10*3/uL (ref 140–400)
RBC: 4.14 10*6/uL (ref 3.80–5.10)
RDW: 13.2 % (ref 11.0–15.0)
Total Lymphocyte: 22.1 %
WBC: 10.1 10*3/uL (ref 3.8–10.8)

## 2019-03-11 LAB — C-REACTIVE PROTEIN: CRP: 6.6 mg/L (ref <8.0)

## 2019-03-11 LAB — SEDIMENTATION RATE: Sed Rate: 31 mm/h — ABNORMAL HIGH (ref 0–30)

## 2019-03-16 ENCOUNTER — Ambulatory Visit (INDEPENDENT_AMBULATORY_CARE_PROVIDER_SITE_OTHER): Payer: Medicare Other

## 2019-03-16 DIAGNOSIS — J3089 Other allergic rhinitis: Secondary | ICD-10-CM

## 2019-03-16 DIAGNOSIS — J302 Other seasonal allergic rhinitis: Secondary | ICD-10-CM | POA: Diagnosis not present

## 2019-03-18 ENCOUNTER — Telehealth: Payer: Self-pay

## 2019-03-18 NOTE — Telephone Encounter (Signed)
VM received. Pt is inquiring about lab results. Pt is aware that EG isn't in the office.

## 2019-03-23 ENCOUNTER — Ambulatory Visit (INDEPENDENT_AMBULATORY_CARE_PROVIDER_SITE_OTHER): Payer: Medicare Other

## 2019-03-23 DIAGNOSIS — J309 Allergic rhinitis, unspecified: Secondary | ICD-10-CM

## 2019-03-30 ENCOUNTER — Ambulatory Visit (INDEPENDENT_AMBULATORY_CARE_PROVIDER_SITE_OTHER): Payer: Medicare Other

## 2019-03-30 DIAGNOSIS — J309 Allergic rhinitis, unspecified: Secondary | ICD-10-CM | POA: Diagnosis not present

## 2019-04-05 ENCOUNTER — Other Ambulatory Visit: Payer: Self-pay | Admitting: Cardiology

## 2019-04-06 ENCOUNTER — Other Ambulatory Visit: Payer: Self-pay

## 2019-04-06 ENCOUNTER — Ambulatory Visit (INDEPENDENT_AMBULATORY_CARE_PROVIDER_SITE_OTHER): Payer: Medicare Other

## 2019-04-06 DIAGNOSIS — J309 Allergic rhinitis, unspecified: Secondary | ICD-10-CM

## 2019-04-07 ENCOUNTER — Other Ambulatory Visit: Payer: Self-pay | Admitting: *Deleted

## 2019-04-07 ENCOUNTER — Telehealth: Payer: Self-pay

## 2019-04-07 MED ORDER — FLUTICASONE-SALMETEROL 500-50 MCG/DOSE IN AEPB: INHALATION_SPRAY | RESPIRATORY_TRACT | 0 refills | Status: AC

## 2019-04-07 MED ORDER — FLUTICASONE PROPIONATE 50 MCG/ACT NA SUSP: 2.0000 | Freq: Every day | NASAL | 0 refills | Status: DC

## 2019-04-07 MED ORDER — ESOMEPRAZOLE MAGNESIUM 40 MG PO CPDR: 40.0000 mg | DELAYED_RELEASE_CAPSULE | Freq: Two times a day (BID) | ORAL | 3 refills | Status: DC

## 2019-04-07 NOTE — Telephone Encounter (Addendum)
Received request from [facility] for refills on Esomeprazole.  I have left Vm for pt to return call and let me know if she wants it sent there since she had refills at [facility].   Pt returned call and does want the Rx sent to [facility].   Forwarding to refill box.

## 2019-04-07 NOTE — Telephone Encounter (Signed)
Done

## 2019-04-13 ENCOUNTER — Other Ambulatory Visit: Payer: Self-pay

## 2019-04-13 ENCOUNTER — Ambulatory Visit (INDEPENDENT_AMBULATORY_CARE_PROVIDER_SITE_OTHER): Payer: Medicare Other

## 2019-04-13 DIAGNOSIS — J309 Allergic rhinitis, unspecified: Secondary | ICD-10-CM

## 2019-04-20 ENCOUNTER — Ambulatory Visit (INDEPENDENT_AMBULATORY_CARE_PROVIDER_SITE_OTHER): Payer: Medicare Other

## 2019-04-20 ENCOUNTER — Other Ambulatory Visit: Payer: Self-pay

## 2019-04-20 DIAGNOSIS — J309 Allergic rhinitis, unspecified: Secondary | ICD-10-CM

## 2019-04-27 ENCOUNTER — Other Ambulatory Visit: Payer: Self-pay

## 2019-04-27 ENCOUNTER — Ambulatory Visit (INDEPENDENT_AMBULATORY_CARE_PROVIDER_SITE_OTHER): Payer: Medicare Other

## 2019-04-27 DIAGNOSIS — J309 Allergic rhinitis, unspecified: Secondary | ICD-10-CM

## 2019-04-29 DIAGNOSIS — I10 Essential (primary) hypertension: Secondary | ICD-10-CM | POA: Diagnosis not present

## 2019-04-29 DIAGNOSIS — E785 Hyperlipidemia, unspecified: Secondary | ICD-10-CM | POA: Diagnosis not present

## 2019-04-29 DIAGNOSIS — E1165 Type 2 diabetes mellitus with hyperglycemia: Secondary | ICD-10-CM | POA: Diagnosis not present

## 2019-05-04 ENCOUNTER — Ambulatory Visit (INDEPENDENT_AMBULATORY_CARE_PROVIDER_SITE_OTHER): Payer: Medicare Other

## 2019-05-04 ENCOUNTER — Other Ambulatory Visit: Payer: Self-pay

## 2019-05-04 DIAGNOSIS — J309 Allergic rhinitis, unspecified: Secondary | ICD-10-CM | POA: Diagnosis not present

## 2019-05-11 ENCOUNTER — Other Ambulatory Visit: Payer: Self-pay

## 2019-05-11 ENCOUNTER — Ambulatory Visit (INDEPENDENT_AMBULATORY_CARE_PROVIDER_SITE_OTHER): Payer: Medicare Other

## 2019-05-11 ENCOUNTER — Encounter: Payer: Self-pay | Admitting: Nurse Practitioner

## 2019-05-11 ENCOUNTER — Encounter: Payer: Self-pay | Admitting: Internal Medicine

## 2019-05-11 ENCOUNTER — Ambulatory Visit (INDEPENDENT_AMBULATORY_CARE_PROVIDER_SITE_OTHER): Payer: Medicare Other | Admitting: Nurse Practitioner

## 2019-05-11 VITALS — BP 133/66 | HR 65 | Temp 96.6°F | Ht 64.0 in | Wt 202.4 lb

## 2019-05-11 DIAGNOSIS — R197 Diarrhea, unspecified: Secondary | ICD-10-CM | POA: Diagnosis not present

## 2019-05-11 DIAGNOSIS — J309 Allergic rhinitis, unspecified: Secondary | ICD-10-CM | POA: Diagnosis not present

## 2019-05-11 DIAGNOSIS — K5100 Ulcerative (chronic) pancolitis without complications: Secondary | ICD-10-CM

## 2019-05-11 NOTE — Assessment & Plan Note (Signed)
History of pan ulcerative colitis.  The patient has been on Colazal twice daily and has generally had well-controlled symptoms.  She is having continued chronic diarrhea.  This is felt to be IBS overlay on her colitis.  Her diarrhea started since taking an antibiotic in 2017.  Of note, a colonoscopy in 2018 found no sign of flared colitis and it is recommended to repeat in 5 years.  At this point I recommend she continue Colazal.  No red flag symptoms such as severe abdominal pain, bloody stools.  No skin rashes or mouth sores.  Follow-up in 3 months.

## 2019-05-11 NOTE — Assessment & Plan Note (Signed)
She continues to have intermittent diarrhea though it does not seem to be severe.  She will almost always have a loose stool after her first meal the day.  On Sunday she will have loose stools later in the day.  She does not often have normal stools.  She is status post cholecystectomy.  Bentyl has not made a difference.  Namebrand Imodium causes constipation.  Over-the-counter/store brand Imodium does help.  At this point I feel her symptoms are predominantly IBS related.  I will have her take a dose of off brand of Imodium scheduled before her first meal the day to try to help her diarrhea.  I will also ask her to start taking a probiotic daily for the next 2 to 3 months to see if this helps.  I requested a progress report in 2 weeks to see how her stools are doing.  We can consider other treatments.  Questran may be an option given her status post cholecystectomy state.  Follow-up in 3 months.

## 2019-05-11 NOTE — Patient Instructions (Signed)
Your health issues we discussed today were:   Colitis: 1. Continue to take Colazal as you have been 2. Let us know if you see any blood in your stools or any severe abdominal pain 3. Call us for any worsening or severe symptoms  Diarrhea: 1. As we discussed, I feel your chronic diarrhea is likely related to irritable bowel syndrome 2. You can stop taking Bentyl as it has not helped. 3. Use over-the-counter/store brand Imodium once a day prior to your first meal the day. 4. You can take additional doses of the store brand Imodium as needed later in the day for any further diarrhea 5. Start taking an over-the-counter probiotic.  Follow directions on the bottle.  Try this for at least a couple months to see if it helps 6. Call us in 2 weeks and let us know how your diarrhea is doing 7. Let us know if you have any worsening or severe symptoms.  Overall I recommend:  1. Continue your other current medications 2. Follow-up in 3 months 3. Let us know if you have any questions or concerns.   Because of recent events of COVID-19 ("Coronavirus"), follow CDC recommendations:  Wash your hand frequently Avoid touching your face Stay away from people who are sick If you have symptoms such as fever, cough, shortness of breath then call your healthcare provider for further guidance If you are sick, STAY AT HOME unless otherwise directed by your healthcare provider. Follow directions from state and national officials regarding staying safe   At [facility] we value your feedback. You may receive a survey about your visit today. Please share your experience as we strive to create trusting relationships with our patients to provide genuine, compassionate, quality care.  We appreciate your understanding and patience as we review any laboratory studies, imaging, and other diagnostic tests that are ordered as we care for you. Our office policy is 5 business days for review of these  results, and any emergent or urgent results are addressed in a timely manner for your best interest. If you do not hear from our office in 1 week, please contact us.   We also encourage the use of MyChart, which contains your medical information for your review as well. If you are not enrolled in this feature, an access code is on this after visit summary for your convenience. Thank you for allowing [place] to be involved in your care.  It was great to see you today!  I hope you have a great summer!!

## 2019-05-11 NOTE — Progress Notes (Signed)
CC'D TO PCP °

## 2019-05-11 NOTE — Progress Notes (Signed)
Referring Provider: Riley Lam Primary Care Physician:  Jalene Mullet, PA-C Primary GI:  Dr. Gala Romney  Chief Complaint  Patient presents with  . Diarrhea    HPI:   [name] is a 68 y.o. female who presents for follow-up on diarrhea and UC.  Patient was last seen in our office 03/10/2019 for ulcerative pancolitis and diarrhea.  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  Did have some improvement regarding her diarrhea with a drug holiday from metformin.  EGD last completed 11/17/2018 with moderate Schatzki's ring status post dilation, esophageal plaques status post brushing, large hiatal hernia.  The brushings found few budding yeast and recommended Diflucan treatment.  At her last visit she noted diarrhea for the last 3 days with about 4-5 stools a day still on Bentyl 4 times a day.  Some nausea a few days prior with 2-3 episodes of emesis.  Stable stress level.  Some worsening abdominal pain 3 days prior and sometimes improves after a bowel movement.  No recent sick contacts, antibiotics, hospital exposure.  GERD well managed on PPI.  No red flags/warning signs or symptoms.  Recommended labs including CBC, CMP, CRP, ESR.  Notably her CRP and ESR have been normal when not having a flare.  Can add Imodium to help with diarrhea.  Follow-up in 2 months.  Labs completed 03/10/2019 which found normal CBC, normal CMP.  CRP normal at 6.6, sed rate elevated at 31 (essentially upper limit normal).  Today she states she's doing ok overall. She is still having diarrhea about daily post-prandial after her morning meal. She will sometimes have recurrent symptoms later in the day. Not having very many normal bowel movements. Stools are flaky and with a lot of mucous-type stuff. She is on Colazal twice daily. More like loose stools consistent with [place] 6. She has bilateral lower abdominal pain which resolves after a bowel movement. She has been taking Bentyl without much effect. She  hasn't taken Imodium because it "stops me up." However, the "off-brands" don't have this effect. Denies any hematochezia, melena, fever, chills, unintentional weight loss. Denies rashes, mouth sores. Energy level is poor, appetite is normal. Some intermittent nausea, no vomiting. Stress level is normal and denies significant amounts of stress in her life. No identified food triggers. Denies URI or flu-like symptoms. Denies loss of sense of taste or smell. Denies chest pain, dyspnea, dizziness, lightheadedness, syncope, near syncope. Denies any other upper or lower GI symptoms.  Her diarrhea is chronic since 2017, despite Colazal. It seems to have started when she was on an antibiotic in 2017.  Past Medical History:  Diagnosis Date  . Anemia   . Anginal pain ([place])    PATIENT STATES DOCT THINKS IS FIBROMYALGIA  . Arthritis   . Asthma   . Atrial fibrillation ([place])   . Blood clot in lung last march  . Cancer ([place])    1980  CERVICAL  . Chronic back pain   . COPD (chronic obstructive pulmonary disease) ([place])   . DDD (degenerative disc disease)   . Degenerative tear of posterior horn of lateral meniscus of right knee 01/27/2014  . Depression   . Diabetes mellitus without complication ([place])   . Diverticulitis   . Fibromyalgia   . GERD (gastroesophageal reflux disease)   . Headache    HX MIGRAINES    . Hiatal hernia   . HOH (hard of hearing)    right  . Pancreatitis   .  Pneumonia    1 YR  . PONV (postoperative nausea and vomiting)    only happened after knee arthroscopy  . Primary localized osteoarthritis of right knee 12/11/2015  . PVC's (premature ventricular contractions)   . Schatzki's ring   . Seizures ([place])    had febrile seizures as child; none since childhood and on no meds  . Shortness of breath dyspnea    WITH EXERTION   . Sleep apnea    CPAP at night  . Ulcerative colitis 2007  . Wears dentures    full top-partial bottom    Past Surgical History:  Procedure Laterality  Date  . arch and foot    . BALLOON DILATION  11/17/2018   Procedure: BALLOON DILATION;  Surgeon: Daneil Dolin, MD;  [facility];  Service: Endoscopy;;  esophagus  . CERVICAL CONE BIOPSY    . CHOLECYSTECTOMY    . COLONOSCOPY  12/03/05   Rourk-marked inflammatory changes of the rectum, left colon, pan colitis, internal hemorrhoids, sigmoid diverticula  . COLONOSCOPY N/A 10/20/2013   Dr. Rourk:Colonic diverticulosis. colonic mucosal ulcerations involving segment of sigmoid colon, atypical for UC. sigmoid colon path with chronic mildly active colitis consistent with IBD, other biopsies including ascending, transverse, descending colon and rectum unremarkable.   . COLONOSCOPY N/A 11/19/2016   Dr. Gala Romney: Diverticulosis, 7 millimeter polyp removed from the descending colon, internal hemorrhoids, segmental biopsies unremarkable.  Marland Kitchen DILATION AND CURETTAGE OF UTERUS    . ESOPHAGEAL DILATION N/A 09/20/2015   Procedure: ESOPHAGEAL DILATION;  Surgeon: Daneil Dolin, MD;  [facility];  Service: Endoscopy;  Laterality: N/A;  . ESOPHAGOGASTRODUODENOSCOPY  01/28/2011   Rourk- Schatzki ring, s/p 59F, otherwise normal/Hiatal hernia otherwise normal stomach D1 and D2  . ESOPHAGOGASTRODUODENOSCOPY  11/30/08   Rourk-Schatzki's ring status post dilation, hiatal hernia  . ESOPHAGOGASTRODUODENOSCOPY  07/12/2007   Dilation with 56 French, Schatzki's ring  . ESOPHAGOGASTRODUODENOSCOPY N/A 08/10/2014   Dr.Rourk- prominent schatzki's ring s/p maloney dilation and disruption, moderate sized hiatal hernia  . ESOPHAGOGASTRODUODENOSCOPY N/A 09/20/2015   RMR: Schatzki's ring status post dilation as described above. Rather large hiatal hernia; somewhat baggy atonic esophageal body' otherwise normal EGD  . ESOPHAGOGASTRODUODENOSCOPY N/A 11/19/2016   Dr. Gala Romney: moderate Schatzki ring s/p dirsuption with biopsy forceps, large hiatal hernia  . ESOPHAGOGASTRODUODENOSCOPY N/A 11/17/2018   Procedure:  ESOPHAGOGASTRODUODENOSCOPY (EGD);  Surgeon: Daneil Dolin, MD;  [facility];  Service: Endoscopy;  Laterality: N/A;  12:00PM  . ESOPHAGOGASTRODUODENOSCOPY (EGD) WITH ESOPHAGEAL DILATION  09/01/2012   Dr. Gala Romney- schatzki's ring, hiatal hernia  . ESOPHAGOGASTRODUODENOSCOPY (EGD) WITH PROPOFOL N/A 02/08/2018   Procedure: ESOPHAGOGASTRODUODENOSCOPY (EGD) WITH PROPOFOL;  Surgeon: Daneil Dolin, MD;  [facility];  Service: Endoscopy;  Laterality: N/A;  10:30am  . FOOT ARTHRODESIS  [id number],[id number]   both feet  . HERNIA REPAIR     umbilical  . JOINT REPLACEMENT Right   . KNEE ARTHROSCOPY WITH LATERAL MENISECTOMY Right 01/27/2014   Procedure: RIGHT KNEE ARTHROSCOPY WITH PARTIAL LATERAL MENISECTOMY, ;  Surgeon: Johnny Bridge, MD;  Location: [facility];  Service: Orthopedics;  Laterality: Right;  . MALONEY DILATION N/A 08/10/2014   Procedure: [name] DILATION;  Surgeon: Daneil Dolin, MD;  [facility];  Service: Endoscopy;  Laterality: N/A;  [name] DILATION N/A 11/19/2016   Procedure: [name] DILATION;  Surgeon: Daneil Dolin, MD;  [facility];  Service: Endoscopy;  Laterality: N/A;  . MALONEY DILATION N/A  02/08/2018   Procedure: [name] DILATION;  Surgeon: Daneil Dolin, MD;  [facility];  Service: Endoscopy;  Laterality: N/A;  [name] DILATION N/A 11/17/2018   Procedure: [name] DILATION;  Surgeon: Daneil Dolin, MD;  [facility];  Service: Endoscopy;  Laterality: N/A;  . SAVORY DILATION N/A 08/10/2014   Procedure: SAVORY DILATION;  Surgeon: Daneil Dolin, MD;  [facility];  Service: Endoscopy;  Laterality: N/A;  . SHOULDER OPEN ROTATOR CUFF REPAIR     BILAT  . TONSILLECTOMY    . TOTAL KNEE ARTHROPLASTY Right 12/11/2015   Procedure: TOTAL KNEE ARTHROPLASTY;  Surgeon: Marchia Bond, MD;  Location: [facility];  Service: Orthopedics;  Laterality: Right;    Current Outpatient Medications  Medication Sig  Dispense Refill  . acetaminophen (TYLENOL) 500 MG tablet Take 1,000 mg by mouth every 6 (six) hours as needed for moderate pain or headache.    . albuterol (PROVENTIL) (2.5 MG/3ML) 0.083% nebulizer solution Take 2.5 mg by nebulization every 6 (six) hours as needed for wheezing or shortness of breath.    Marland Kitchen albuterol (PROVENTIL,VENTOLIN) 90 MCG/ACT inhaler Inhale 2 puffs into the lungs every 6 (six) hours as needed for wheezing or shortness of breath.     Marland Kitchen azelastine (ASTELIN) 137 MCG/SPRAY nasal spray 2 sprays by Nasal route 2 (two) times daily. Use in each nostril as directed     . buPROPion (WELLBUTRIN XL) 150 MG 24 hr tablet Take 150 mg by mouth daily.    . busPIRone (BUSPAR) 15 MG tablet Take 7.5 mg by mouth 2 (two) times daily.    . Calcium Carb-Cholecalciferol (CALCIUM-VITAMIN D) 600-400 MG-UNIT TABS Take 1 tablet by mouth 2 (two) times daily.    . cetirizine (ZYRTEC) 10 MG tablet Take 10 mg by mouth daily.    Marland Kitchen COLAZAL 750 MG capsule TAKE 3 CAPSULES TWICE A DAY (Patient taking differently: Take 2,250 mg by mouth 2 (two) times daily. ) 540 capsule 4  . dicyclomine (BENTYL) 10 MG capsule TAKE 1 CAPSULE FOUR TIMES A DAY BEFORE MEALS AND AT BEDTIME (Patient taking differently: Take 10 mg by mouth 4 (four) times daily. ) 360 capsule 4  . DULoxetine (CYMBALTA) 30 MG capsule Take 120 mg by mouth daily.     Marland Kitchen ELIQUIS 5 MG TABS tablet Take 1 tablet (5 mg total) by mouth 2 (two) times daily. 180 tablet 3  . esomeprazole (NEXIUM) 40 MG capsule Take 1 capsule (40 mg total) by mouth 2 (two) times daily before a meal. 180 capsule 3  . famotidine-calcium carbonate-magnesium hydroxide (PEPCID COMPLETE) 10-800-165 MG CHEW chewable tablet Chew 1 tablet by mouth daily as needed (for breakthrough GERD).     . ferrous sulfate 325 (65 FE) MG tablet Take 325 mg by mouth daily with breakfast.    . fluticasone (FLONASE) 50 MCG/ACT nasal spray Place 2 sprays into both nostrils daily. 48 g 0  . Fluticasone-Salmeterol  (ADVAIR DISKUS) 500-50 MCG/DOSE AEPB Inhale one puff twice daily 180 each 0  . FREESTYLE LITE test strip     . ketotifen (ALAWAY) 0.025 % ophthalmic solution Place 1 drop into both eyes daily.     Marland Kitchen lidocaine (LIDODERM) 5 % Place 1-2 patches onto the skin daily as needed (pain). Remove & Discard patch within 12 hours or as directed by MD    . methylcellulose (CITRUCEL) oral powder Take 1 packet by mouth daily as needed (constipation).     . metoprolol tartrate (LOPRESSOR)  25 MG tablet TAKE ONE-HALF (1/2) TABLET TWICE A DAY 90 tablet 3  . pravastatin (PRAVACHOL) 20 MG tablet TAKE 1 TABLET DAILY 90 tablet 1  . pregabalin (LYRICA) 100 MG capsule Take 100 mg by mouth 2 (two) times daily.    . Probiotic CAPS Take 1 capsule by mouth daily.    . traZODone (DESYREL) 100 MG tablet Take 100 mg by mouth at bedtime.       No current facility-administered medications for this visit.     Allergies as of 05/11/2019 - Review Complete 05/11/2019  Allergen Reaction Noted  . Mesalamine Other (See Comments)   . Cefprozil Other (See Comments)   . Cefuroxime axetil Other (See Comments) 08/08/2008  . Morphine and related Hives and Itching 08/23/2012  . Penicillins Hives 08/08/2008  . Aspirin Other (See Comments)   . Chicken allergy Nausea And Vomiting and Rash 11/27/2015  . Clarithromycin Nausea And Vomiting 08/08/2008  . Codeine Other (See Comments)   . Entex Other (See Comments) 08/08/2008    Family History  Problem Relation Age of Onset  . Heart disease Mother   . Colitis Mother   . Atrial fibrillation Sister   . Arrhythmia Brother   . Colon cancer Neg Hx     Social History   Socioeconomic History  . Marital status: Married    Spouse name: Not on file  . Number of children: 3  . Years of education: Not on file  . Highest education level: Not on file  Occupational History  . 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 Financial resource strain: Not on file  . Food insecurity     Worry: Not on file    Inability: Not on file  . Transportation needs    Medical: Not on file    Non-medical: Not on file  Tobacco Use  . Smoking status: Former Smoker    Packs/day: 1.00    Years: 30.00    Pack years: 30.00    Types: Cigarettes    Start date: 07/07/1969    Quit date: 08/30/2011    Years since quitting: 7.7  . Smokeless tobacco: Never Used  Substance and Sexual Activity  . Alcohol use: No    Alcohol/week: 0.0 standard drinks  . Drug use: No  . Sexual activity: Yes    Partners: Male    Birth control/protection: None    Comment: spouse  Lifestyle  . Physical activity    Days per week: Not on file    Minutes per session: Not on file  . Stress: Not on file  Relationships  . Social Herbalist on phone: Not on file    Gets together: Not on file    Attends religious service: Not on file    Active member of club or organization: Not on file    Attends meetings of clubs or organizations: Not on file    Relationship status: Not on file  Other Topics Concern  . Not on file  Social History Narrative  . Not on file    Review of Systems: General: Negative for anorexia, weight loss, fever, chills, fatigue, weakness. ENT: Negative for hoarseness, difficulty swallowing. CV: Negative for chest pain, angina, palpitations, peripheral edema.  Respiratory: Negative for dyspnea at rest, cough, sputum, wheezing.  GI: See history of present illness. Derm: Negative for rash or itching.  Endo: Negative for unusual weight change.  Heme: Negative for bruising or bleeding. Allergy: Negative for rash or hives.  Physical Exam: BP 133/66   Pulse 65   Temp (!) 96.6 F (35.9 C) (Temporal)   Ht 5' 4"  (1.626 m)   Wt 202 lb 6.4 oz (91.8 kg)   BMI 34.74 kg/m  General:   Alert and oriented. Pleasant and cooperative. Well-nourished and well-developed.  Eyes:  Without icterus, sclera clear and conjunctiva pink.  Ears:  Normal auditory acuity. Mouth:  No deformity  or lesions, oral mucosa pink.  Cardiovascular:  S1, S2 present without murmurs appreciated. Extremities without clubbing or edema. Respiratory:  Clear to auscultation bilaterally. No wheezes, rales, or rhonchi. No distress.  Gastrointestinal:  +BS, soft, non-tender and non-distended. No HSM noted. No guarding or rebound. No masses appreciated.  Rectal:  Deferred  Musculoskalatal:  Symmetrical without gross deformities. Neurologic:  Alert and oriented x4;  grossly normal neurologically. Psych:  Alert and cooperative. Normal mood and affect. Heme/Lymph/Immune: No excessive bruising noted.    05/11/2019 9:29 AM   Disclaimer: This note was dictated with voice recognition software. Similar sounding words can inadvertently be transcribed and may not be corrected upon review.

## 2019-05-20 ENCOUNTER — Emergency Department (HOSPITAL_COMMUNITY): Payer: Medicare Other

## 2019-05-20 ENCOUNTER — Encounter (HOSPITAL_COMMUNITY): Payer: Self-pay | Admitting: Emergency Medicine

## 2019-05-20 ENCOUNTER — Inpatient Hospital Stay (HOSPITAL_COMMUNITY)
Admission: EM | Admit: 2019-05-20 | Discharge: 2019-06-15 | DRG: 372 | Disposition: A | Discharge status: Home / Self Care | Payer: Medicare Other | Attending: Family Medicine | Admitting: Family Medicine

## 2019-05-20 ENCOUNTER — Other Ambulatory Visit: Payer: Self-pay

## 2019-05-20 ENCOUNTER — Ambulatory Visit: Payer: Medicare Other | Admitting: Allergy & Immunology

## 2019-05-20 DIAGNOSIS — D500 Iron deficiency anemia secondary to blood loss (chronic): Secondary | ICD-10-CM | POA: Diagnosis present

## 2019-05-20 DIAGNOSIS — A0472 Enterocolitis due to Clostridium difficile, not specified as recurrent: Secondary | ICD-10-CM | POA: Diagnosis not present

## 2019-05-20 DIAGNOSIS — I959 Hypotension, unspecified: Secondary | ICD-10-CM | POA: Diagnosis not present

## 2019-05-20 DIAGNOSIS — F419 Anxiety disorder, unspecified: Secondary | ICD-10-CM | POA: Diagnosis present

## 2019-05-20 DIAGNOSIS — E119 Type 2 diabetes mellitus without complications: Secondary | ICD-10-CM | POA: Diagnosis present

## 2019-05-20 DIAGNOSIS — Z20828 Contact with and (suspected) exposure to other viral communicable diseases: Secondary | ICD-10-CM | POA: Diagnosis not present

## 2019-05-20 DIAGNOSIS — Z8719 Personal history of other diseases of the digestive system: Secondary | ICD-10-CM

## 2019-05-20 DIAGNOSIS — Z79899 Other long term (current) drug therapy: Secondary | ICD-10-CM

## 2019-05-20 DIAGNOSIS — Z9089 Acquired absence of other organs: Secondary | ICD-10-CM

## 2019-05-20 DIAGNOSIS — R197 Diarrhea, unspecified: Secondary | ICD-10-CM

## 2019-05-20 DIAGNOSIS — R05 Cough: Secondary | ICD-10-CM

## 2019-05-20 DIAGNOSIS — Z885 Allergy status to narcotic agent status: Secondary | ICD-10-CM

## 2019-05-20 DIAGNOSIS — E440 Moderate protein-calorie malnutrition: Secondary | ICD-10-CM | POA: Diagnosis not present

## 2019-05-20 DIAGNOSIS — Z9049 Acquired absence of other specified parts of digestive tract: Secondary | ICD-10-CM

## 2019-05-20 DIAGNOSIS — Z86711 Personal history of pulmonary embolism: Secondary | ICD-10-CM

## 2019-05-20 DIAGNOSIS — K5100 Ulcerative (chronic) pancolitis without complications: Secondary | ICD-10-CM | POA: Diagnosis present

## 2019-05-20 DIAGNOSIS — Z888 Allergy status to other drugs, medicaments and biological substances status: Secondary | ICD-10-CM

## 2019-05-20 DIAGNOSIS — Z87891 Personal history of nicotine dependence: Secondary | ICD-10-CM

## 2019-05-20 DIAGNOSIS — I482 Chronic atrial fibrillation, unspecified: Secondary | ICD-10-CM | POA: Diagnosis not present

## 2019-05-20 DIAGNOSIS — R300 Dysuria: Secondary | ICD-10-CM | POA: Diagnosis not present

## 2019-05-20 DIAGNOSIS — Z8249 Family history of ischemic heart disease and other diseases of the circulatory system: Secondary | ICD-10-CM

## 2019-05-20 DIAGNOSIS — J449 Chronic obstructive pulmonary disease, unspecified: Secondary | ICD-10-CM | POA: Diagnosis present

## 2019-05-20 DIAGNOSIS — Z96651 Presence of right artificial knee joint: Secondary | ICD-10-CM | POA: Diagnosis present

## 2019-05-20 DIAGNOSIS — E876 Hypokalemia: Secondary | ICD-10-CM | POA: Diagnosis not present

## 2019-05-20 DIAGNOSIS — H9191 Unspecified hearing loss, right ear: Secondary | ICD-10-CM | POA: Diagnosis present

## 2019-05-20 DIAGNOSIS — M797 Fibromyalgia: Secondary | ICD-10-CM | POA: Diagnosis present

## 2019-05-20 DIAGNOSIS — R000 Tachycardia, unspecified: Secondary | ICD-10-CM | POA: Diagnosis not present

## 2019-05-20 DIAGNOSIS — D6489 Other specified anemias: Secondary | ICD-10-CM | POA: Diagnosis present

## 2019-05-20 DIAGNOSIS — R059 Cough, unspecified: Secondary | ICD-10-CM

## 2019-05-20 DIAGNOSIS — Z7901 Long term (current) use of anticoagulants: Secondary | ICD-10-CM

## 2019-05-20 DIAGNOSIS — Z8379 Family history of other diseases of the digestive system: Secondary | ICD-10-CM

## 2019-05-20 DIAGNOSIS — Z886 Allergy status to analgesic agent status: Secondary | ICD-10-CM

## 2019-05-20 DIAGNOSIS — K529 Noninfective gastroenteritis and colitis, unspecified: Secondary | ICD-10-CM | POA: Diagnosis not present

## 2019-05-20 DIAGNOSIS — F329 Major depressive disorder, single episode, unspecified: Secondary | ICD-10-CM | POA: Diagnosis present

## 2019-05-20 DIAGNOSIS — K219 Gastro-esophageal reflux disease without esophagitis: Secondary | ICD-10-CM | POA: Diagnosis present

## 2019-05-20 DIAGNOSIS — K921 Melena: Secondary | ICD-10-CM

## 2019-05-20 DIAGNOSIS — R509 Fever, unspecified: Secondary | ICD-10-CM | POA: Diagnosis not present

## 2019-05-20 DIAGNOSIS — Z6833 Body mass index (BMI) 33.0-33.9, adult: Secondary | ICD-10-CM

## 2019-05-20 DIAGNOSIS — Z881 Allergy status to other antibiotic agents status: Secondary | ICD-10-CM

## 2019-05-20 DIAGNOSIS — Z7951 Long term (current) use of inhaled steroids: Secondary | ICD-10-CM

## 2019-05-20 DIAGNOSIS — E860 Dehydration: Secondary | ICD-10-CM | POA: Diagnosis present

## 2019-05-20 DIAGNOSIS — Z880 Allergy status to penicillin: Secondary | ICD-10-CM

## 2019-05-20 DIAGNOSIS — G4730 Sleep apnea, unspecified: Secondary | ICD-10-CM | POA: Diagnosis present

## 2019-05-20 LAB — DIFFERENTIAL
Abs Immature Granulocytes: 0.04 10*3/uL (ref 0.00–0.07)
Basophils Absolute: 0.1 10*3/uL (ref 0.0–0.1)
Basophils Relative: 0 %
Eosinophils Absolute: 0.1 10*3/uL (ref 0.0–0.5)
Eosinophils Relative: 1 %
Immature Granulocytes: 0 %
Lymphocytes Relative: 16 %
Lymphs Abs: 2.1 10*3/uL (ref 0.7–4.0)
Monocytes Absolute: 1.2 10*3/uL — ABNORMAL HIGH (ref 0.1–1.0)
Monocytes Relative: 9 %
Neutro Abs: 9.5 10*3/uL — ABNORMAL HIGH (ref 1.7–7.7)
Neutrophils Relative %: 74 %

## 2019-05-20 LAB — CBC
HCT: 39.8 % (ref 36.0–46.0)
Hemoglobin: 12.8 g/dL (ref 12.0–15.0)
MCH: 30.2 pg (ref 26.0–34.0)
MCHC: 32.2 g/dL (ref 30.0–36.0)
MCV: 93.9 fL (ref 80.0–100.0)
Platelets: 305 10*3/uL (ref 150–400)
RBC: 4.24 MIL/uL (ref 3.87–5.11)
RDW: 14.8 % (ref 11.5–15.5)
WBC: 13.4 10*3/uL — ABNORMAL HIGH (ref 4.0–10.5)
nRBC: 0 % (ref 0.0–0.2)

## 2019-05-20 LAB — URINALYSIS, ROUTINE W REFLEX MICROSCOPIC
Bacteria, UA: NONE SEEN
Bilirubin Urine: NEGATIVE
Glucose, UA: NEGATIVE mg/dL
Ketones, ur: 20 mg/dL — AB
Leukocytes,Ua: NEGATIVE
Nitrite: NEGATIVE
Protein, ur: NEGATIVE mg/dL
Specific Gravity, Urine: 1.043 — ABNORMAL HIGH (ref 1.005–1.030)
pH: 6 (ref 5.0–8.0)

## 2019-05-20 LAB — COMPREHENSIVE METABOLIC PANEL
ALT: 13 U/L (ref 0–44)
AST: 13 U/L — ABNORMAL LOW (ref 15–41)
Albumin: 3.5 g/dL (ref 3.5–5.0)
Alkaline Phosphatase: 88 U/L (ref 38–126)
Anion gap: 8 (ref 5–15)
BUN: 7 mg/dL — ABNORMAL LOW (ref 8–23)
CO2: 25 mmol/L (ref 22–32)
Calcium: 8.8 mg/dL — ABNORMAL LOW (ref 8.9–10.3)
Chloride: 101 mmol/L (ref 98–111)
Creatinine, Ser: 0.8 mg/dL (ref 0.44–1.00)
GFR calc Af Amer: >60 mL/min (ref >60)
GFR calc non Af Amer: >60 mL/min (ref >60)
Glucose, Bld: 125 mg/dL — ABNORMAL HIGH (ref 70–99)
Potassium: 3.6 mmol/L (ref 3.5–5.1)
Sodium: 134 mmol/L — ABNORMAL LOW (ref 135–145)
Total Bilirubin: 0.7 mg/dL (ref 0.3–1.2)
Total Protein: 7.2 g/dL (ref 6.5–8.1)

## 2019-05-20 LAB — LIPASE, BLOOD: Lipase: 16 U/L (ref 11–51)

## 2019-05-20 MED ORDER — IOHEXOL 300 MG/ML  SOLN
100.0000 mL | Freq: Once | INTRAMUSCULAR | Status: AC | PRN
  Administered 2019-05-20: 19:00:00 100 mL via INTRAVENOUS

## 2019-05-20 MED ORDER — CIPROFLOXACIN IN D5W 400 MG/200ML IV SOLN
400.0000 mg | Freq: Once | INTRAVENOUS | Status: AC
  Administered 2019-05-20: 400 mg via INTRAVENOUS
  Filled 2019-05-20: qty 200

## 2019-05-20 MED ORDER — CIPROFLOXACIN HCL 250 MG PO TABS: 500.0000 mg | ORAL_TABLET | Freq: Once | ORAL | Status: DC

## 2019-05-20 MED ORDER — METRONIDAZOLE IN NACL 5-0.79 MG/ML-% IV SOLN
500.0000 mg | Freq: Once | INTRAVENOUS | Status: AC
  Administered 2019-05-20: 500 mg via INTRAVENOUS
  Filled 2019-05-20: qty 100

## 2019-05-20 MED ORDER — SODIUM CHLORIDE 0.9 % IV BOLUS
500.0000 mL | Freq: Once | INTRAVENOUS | Status: AC
  Administered 2019-05-20: 500 mL via INTRAVENOUS

## 2019-05-20 MED ORDER — METRONIDAZOLE 500 MG PO TABS: 500.0000 mg | ORAL_TABLET | Freq: Once | ORAL | Status: DC

## 2019-05-20 NOTE — ED Provider Notes (Signed)
[facility] EMERGENCY DEPARTMENT Provider Note   CSN: [id number] Arrival date & time: 05/20/19  1439     History   Chief Complaint Chief Complaint  Patient presents with   Abdominal Pain    HPI [name] is a 68 y.o. female.     HPI  Pt was seen at 1720.   Per pt, c/o gradual onset and persistence of constant generalized abd "pain" for the past 3 days. Has been associated with multiple intermittent episodes of diarrhea and "not wanting to eat."  Describes the abd pain as "cramping." Has had home fevers to "99" and "100.5."  Denies N/V, no back pain, no rash, no CP/SOB, no black or blood in stools, no dysuria/hematuria.      Past Medical History:  Diagnosis Date   Anemia    Anginal pain ([place])    PATIENT STATES DOCT THINKS IS FIBROMYALGIA   Arthritis    Asthma    Atrial fibrillation ([place])    Blood clot in lung last march   Cancer [facility])    1980  CERVICAL   Chronic back pain    COPD (chronic obstructive pulmonary disease) (HCC)    DDD (degenerative disc disease)    Degenerative tear of posterior horn of lateral meniscus of right knee 01/27/2014   Depression    Diabetes mellitus without complication (HCC)    Diverticulitis    Fibromyalgia    GERD (gastroesophageal reflux disease)    Headache    HX MIGRAINES     Hiatal hernia    HOH (hard of hearing)    right   Pancreatitis    Pneumonia    1 YR   PONV (postoperative nausea and vomiting)    only happened after knee arthroscopy   Primary localized osteoarthritis of right knee 12/11/2015   PVC's (premature ventricular contractions)    Schatzki's ring    Seizures ([place])    had febrile seizures as child; none since childhood and on no meds   Shortness of breath dyspnea    WITH EXERTION    Sleep apnea    CPAP at night   Ulcerative colitis 2007   Wears dentures    full top-partial bottom    Patient Active Problem List   Diagnosis Date Noted   IDA (iron deficiency anemia)  08/11/2018   Diarrhea 01/06/2018   Primary localized osteoarthritis of right knee 12/11/2015   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   Hiatal hernia    Schatzki's ring    Degenerative tear of posterior horn of lateral meniscus of right knee 01/27/2014   Ulcerative colitis ([place]) 01/09/2011   Dysphagia 01/09/2011   CONSTIPATION 08/01/2009   LLQ pain 08/01/2009   GERD 06/14/2009   ULCERATIVE COLITIS--UNIVERSAL ULCERATIVE COLITIS 06/14/2009    Past Surgical History:  Procedure Laterality Date   arch and foot     BALLOON DILATION  11/17/2018   Procedure: BALLOON DILATION;  Surgeon: Daneil Dolin, MD;  [facility];  Service: Endoscopy;;  esophagus   CERVICAL CONE BIOPSY     CHOLECYSTECTOMY     COLONOSCOPY  12/03/05   Rourk-marked inflammatory changes of the rectum, left colon, pan colitis, internal hemorrhoids, sigmoid diverticula   COLONOSCOPY N/A 10/20/2013   Dr. Rourk:Colonic diverticulosis. colonic mucosal ulcerations involving segment of sigmoid colon, atypical for UC. sigmoid colon path with chronic mildly active colitis consistent with IBD, other biopsies including ascending, transverse, descending colon and rectum unremarkable.    COLONOSCOPY N/A 11/19/2016  Dr. Gala Romney: Diverticulosis, 7 millimeter polyp removed from the descending colon, internal hemorrhoids, segmental biopsies unremarkable.   DILATION AND CURETTAGE OF UTERUS     ESOPHAGEAL DILATION N/A 09/20/2015   Procedure: ESOPHAGEAL DILATION;  Surgeon: Daneil Dolin, MD;  [facility]: [facility];  Service: Endoscopy;  Laterality: N/A;   ESOPHAGOGASTRODUODENOSCOPY  01/28/2011   Rourk- Schatzki ring, s/p 74F, otherwise normal/Hiatal hernia otherwise normal stomach D1 and D2   ESOPHAGOGASTRODUODENOSCOPY  11/30/08   Rourk-Schatzki's ring status post dilation, hiatal hernia   ESOPHAGOGASTRODUODENOSCOPY  07/12/2007   Dilation with 16 French, Schatzki's ring    ESOPHAGOGASTRODUODENOSCOPY N/A 08/10/2014   Dr.Rourk- prominent schatzki's ring s/p maloney dilation and disruption, moderate sized hiatal hernia   ESOPHAGOGASTRODUODENOSCOPY N/A 09/20/2015   RMR: Schatzki's ring status post dilation as described above. Rather large hiatal hernia; somewhat baggy atonic esophageal body' otherwise normal EGD   ESOPHAGOGASTRODUODENOSCOPY N/A 11/19/2016   Dr. Gala Romney: moderate Schatzki ring s/p dirsuption with biopsy forceps, large hiatal hernia   ESOPHAGOGASTRODUODENOSCOPY N/A 11/17/2018   Procedure: ESOPHAGOGASTRODUODENOSCOPY (EGD);  Surgeon: Daneil Dolin, MD;  [facility];  Service: Endoscopy;  Laterality: N/A;  12:00PM   ESOPHAGOGASTRODUODENOSCOPY (EGD) WITH ESOPHAGEAL DILATION  09/01/2012   Dr. Gala Romney- schatzki's ring, hiatal hernia   ESOPHAGOGASTRODUODENOSCOPY (EGD) WITH PROPOFOL N/A 02/08/2018   Procedure: ESOPHAGOGASTRODUODENOSCOPY (EGD) WITH PROPOFOL;  Surgeon: Daneil Dolin, MD;  [facility];  Service: Endoscopy;  Laterality: N/A;  10:30am   FOOT ARTHRODESIS  2007,2010   both feet   HERNIA REPAIR     umbilical   JOINT REPLACEMENT Right    KNEE ARTHROSCOPY WITH LATERAL MENISECTOMY Right 01/27/2014   Procedure: RIGHT KNEE ARTHROSCOPY WITH PARTIAL LATERAL MENISECTOMY, ;  Surgeon: Johnny Bridge, MD;  Location: [place];  Service: Orthopedics;  Laterality: Right;   MALONEY DILATION N/A 08/10/2014   Procedure: [name] DILATION;  Surgeon: Daneil Dolin, MD;  [facility];  Service: Endoscopy;  Laterality: N/A;   MALONEY DILATION N/A 11/19/2016   Procedure: [name] DILATION;  Surgeon: Daneil Dolin, MD;  [facility];  Service: Endoscopy;  Laterality: N/A;   MALONEY DILATION N/A 02/08/2018   Procedure: [name] DILATION;  Surgeon: Daneil Dolin, MD;  [facility];  Service: Endoscopy;  Laterality: N/A;   MALONEY DILATION N/A 11/17/2018   Procedure: [name] DILATION;  Surgeon:  Daneil Dolin, MD;  [facility];  Service: Endoscopy;  Laterality: N/A;   SAVORY DILATION N/A 08/10/2014   Procedure: SAVORY DILATION;  Surgeon: Daneil Dolin, MD;  [facility];  Service: Endoscopy;  Laterality: N/A;   SHOULDER OPEN ROTATOR CUFF REPAIR     BILAT   TONSILLECTOMY     TOTAL KNEE ARTHROPLASTY Right 12/11/2015   Procedure: TOTAL KNEE ARTHROPLASTY;  Surgeon: Marchia Bond, MD;  Location: [place];  Service: Orthopedics;  Laterality: Right;     OB History   No obstetric history on file.      Home Medications    Prior to Admission medications   Medication Sig Start Date End Date Taking? Authorizing Provider  acetaminophen (TYLENOL) 500 MG tablet Take 1,000 mg by mouth every 6 (six) hours as needed for moderate pain or headache.    [provider]  albuterol (PROVENTIL) (2.5 MG/3ML) 0.083% nebulizer solution Take 2.5 mg by nebulization every 6 (six) hours as needed for wheezing or shortness of breath.    [provider]  albuterol (PROVENTIL,VENTOLIN) 90  MCG/ACT inhaler Inhale 2 puffs into the lungs every 6 (six) hours as needed for wheezing or shortness of breath.     [provider]  azelastine (ASTELIN) 137 MCG/SPRAY nasal spray 2 sprays by Nasal route 2 (two) times daily. Use in each nostril as directed     [provider]  buPROPion (WELLBUTRIN XL) 150 MG 24 hr tablet Take 150 mg by mouth daily. 10/09/15   [provider]  busPIRone (BUSPAR) 15 MG tablet Take 7.5 mg by mouth 2 (two) times daily. 09/01/18   [provider]  Calcium Carb-Cholecalciferol (CALCIUM-VITAMIN D) 600-400 MG-UNIT TABS Take 1 tablet by mouth 2 (two) times daily.    [provider]  cetirizine (ZYRTEC) 10 MG tablet Take 10 mg by mouth daily.    [provider]  COLAZAL 750 MG capsule TAKE 3 CAPSULES TWICE A DAY Patient taking differently: Take 2,250 mg by mouth 2 (two) times daily.  07/10/18   Mahala Menghini, PA-C  dicyclomine (BENTYL) 10 MG capsule TAKE 1 CAPSULE FOUR TIMES A DAY BEFORE MEALS AND AT BEDTIME Patient taking differently: Take 10 mg by mouth 4 (four) times daily.  08/04/18   Carlis Stable, NP  DULoxetine (CYMBALTA) 30 MG capsule Take 120 mg by mouth daily.     [provider]  ELIQUIS 5 MG TABS tablet Take 1 tablet (5 mg total) by mouth 2 (two) times daily. 02/25/19   Arnoldo Lenis, MD  esomeprazole (NEXIUM) 40 MG capsule Take 1 capsule (40 mg total) by mouth 2 (two) times daily before a meal. 04/07/19   Mahala Menghini, PA-C  famotidine-calcium carbonate-magnesium hydroxide (PEPCID COMPLETE) 10-800-165 MG CHEW chewable tablet Chew 1 tablet by mouth daily as needed (for breakthrough GERD).     [provider]  ferrous sulfate 325 (65 FE) MG tablet Take 325 mg by mouth daily with breakfast.    [provider]  fluticasone (FLONASE) 50 MCG/ACT nasal spray Place 2 sprays into both nostrils daily. 04/07/19   Kozlow, Donnamarie Poag, MD  Fluticasone-Salmeterol (ADVAIR DISKUS) 500-50 MCG/DOSE AEPB Inhale one puff twice daily 04/07/19   Kozlow, Donnamarie Poag, MD  FREESTYLE LITE test strip  01/17/19   [provider]  ketotifen (ALAWAY) 0.025 % ophthalmic solution Place 1 drop into both eyes daily.     [provider]  lidocaine (LIDODERM) 5 % Place 1-2 patches onto the skin daily as needed (pain). Remove & Discard patch within 12 hours or as directed by MD    [provider]  methylcellulose (CITRUCEL) oral powder Take 1 packet by mouth daily as needed (constipation).     [provider]  metoprolol tartrate (LOPRESSOR) 25 MG tablet TAKE ONE-HALF (1/2) TABLET TWICE A DAY 02/08/19   Arnoldo Lenis, MD  pravastatin (PRAVACHOL) 20 MG tablet TAKE 1 TABLET DAILY 04/05/19   Arnoldo Lenis, MD  pregabalin (LYRICA) 100 MG capsule Take 100 mg by mouth 2 (two) times daily.    [provider]  Probiotic CAPS Take 1 capsule by mouth daily.     [provider]  traZODone (DESYREL) 100 MG tablet Take 100 mg by mouth at bedtime.      [provider]    Family History Family History  Problem Relation Age of Onset   Heart disease Mother    Colitis Mother    Atrial fibrillation Sister    Arrhythmia Brother    Colon cancer Neg Hx  Social History Social History   Tobacco Use   Smoking status: Former Smoker    Packs/day: 1.00    Years: 30.00    Pack years: 30.00    Types: Cigarettes    Start date: 07/07/1969    Quit date: 08/30/2011    Years since quitting: 7.7   Smokeless tobacco: Never Used  Substance Use Topics   Alcohol use: No    Alcohol/week: 0.0 standard drinks   Drug use: No     Allergies   Mesalamine, Cefprozil, Cefuroxime axetil, Morphine and related, Penicillins, Aspirin, Chicken allergy, Clarithromycin, Codeine, and Entex   Review of Systems Review of Systems ROS: Statement: All systems negative except as marked or noted in the HPI; Constitutional: +fever. ; ; Eyes: Negative for eye pain, redness and discharge. ; ; ENMT: Negative for ear pain, hoarseness, nasal congestion, sinus pressure and sore throat. ; ; Cardiovascular: Negative for chest pain, palpitations, diaphoresis, dyspnea and peripheral edema. ; ; Respiratory: Negative for cough, wheezing and stridor. ; ; Gastrointestinal: +diarrhea, abd pain. Negative for nausea, vomiting, blood in stool, hematemesis, jaundice and rectal bleeding. . ; ; Genitourinary: Negative for dysuria, flank pain and hematuria. ; ; Musculoskeletal: Negative for back pain and neck pain. Negative for swelling and trauma.; ; Skin: Negative for pruritus, rash, abrasions, blisters, bruising and skin lesion.; ; Neuro: Negative for headache, lightheadedness and neck stiffness. Negative for weakness, altered level of consciousness, altered mental status, extremity weakness, paresthesias, involuntary movement, seizure and syncope.       Physical  Exam Updated Vital Signs BP 106/79    Pulse (!) 107    Temp 98.8 F (37.1 C)    Resp 18    Ht 5' 4"  (1.626 m)    Wt 90.7 kg    SpO2 97%    BMI 34.33 kg/m     Patient Vitals for the past 24 hrs:  BP Temp Temp src Pulse Resp SpO2 Height Weight  05/20/19 2116 (!) 144/90 -- -- (!) 116 20 96 % -- --  05/20/19 1715 106/79 98.8 F (37.1 C) -- (!) 107 18 97 % -- --  05/20/19 1506 -- -- -- -- -- -- 5' 4"  (1.626 m) 90.7 kg  05/20/19 1505 105/82 99.8 F (37.7 C) Oral (!) 106 16 94 % -- --   21:09 Orthostatic Vital Signs AH  Orthostatic Lying   BP- Lying: 125/84  Pulse- Lying: 107      Orthostatic Sitting  BP- Sitting: 143/92Abnormal   Pulse- Sitting: 108      Orthostatic Standing at 0 minutes  BP- Standing at 0 minutes: 147/92Abnormal   Pulse- Standing at 0 minutes: 114      Orthostatic Standing at 3 minutes  BP- Standing at 3 minutes: 144/90  Pulse- Standing at 3 minutes: 114     Physical Exam 1725: Physical examination:  Nursing notes reviewed; Vital signs and O2 SAT reviewed;  Constitutional: Well developed, Well nourished, In no acute distress; Head:  Normocephalic, atraumatic; Eyes: EOMI, PERRL, No scleral icterus; ENMT: Mouth and pharynx normal, Mucous membranes dry; Neck: Supple, Full range of motion, No lymphadenopathy; Cardiovascular: Regular rate and rhythm, No gallop; Respiratory: Breath sounds clear & equal bilaterally, No wheezes.  Speaking full sentences with ease, Normal respiratory effort/excursion; Chest: Nontender, Movement normal; Abdomen: Soft, +diffuse tenderness to palp. No rebound or guarding. Nondistended, Normal bowel sounds; Genitourinary: No CVA tenderness; Extremities: Peripheral pulses normal, No tenderness, No edema, No calf edema or asymmetry.; Neuro: AA&Ox3, Major CN grossly  intact.  Speech clear. No gross focal motor or sensory deficits in extremities.; Skin: Color normal, Warm, Dry.     ED Treatments / Results  Labs (all labs ordered are listed,  but only abnormal results are displayed)   EKG None  Radiology   Procedures Procedures (including critical care time)  Medications Ordered in ED Medications  sodium chloride 0.9 % bolus 500 mL (has no administration in time range)     Initial Impression / Assessment and Plan / ED Course  I have reviewed the triage vital signs and the nursing notes.  Pertinent labs & imaging results that were available during my care of the patient were reviewed by me and considered in my medical decision making (see chart for details).     MDM Reviewed: previous chart, nursing note and vitals Reviewed previous: labs Interpretation: labs, x-ray and CT scan    Results for orders placed or performed during the hospital encounter of 05/20/19  Lipase, blood  Result Value Ref Range   Lipase 16 11 - 51 U/L  Comprehensive metabolic panel  Result Value Ref Range   Sodium 134 (L) 135 - 145 mmol/L   Potassium 3.6 3.5 - 5.1 mmol/L   Chloride 101 98 - 111 mmol/L   CO2 25 22 - 32 mmol/L   Glucose, Bld 125 (H) 70 - 99 mg/dL   BUN 7 (L) 8 - 23 mg/dL   Creatinine, Ser 0.80 0.44 - 1.00 mg/dL   Calcium 8.8 (L) 8.9 - 10.3 mg/dL   Total Protein 7.2 6.5 - 8.1 g/dL   Albumin 3.5 3.5 - 5.0 g/dL   AST 13 (L) 15 - 41 U/L   ALT 13 0 - 44 U/L   Alkaline Phosphatase 88 38 - 126 U/L   Total Bilirubin 0.7 0.3 - 1.2 mg/dL   GFR calc non Af Amer >60 >60 mL/min   GFR calc Af Amer >60 >60 mL/min   Anion gap 8 5 - 15  CBC  Result Value Ref Range   WBC 13.4 (H) 4.0 - 10.5 K/uL   RBC 4.24 3.87 - 5.11 MIL/uL   Hemoglobin 12.8 12.0 - 15.0 g/dL   HCT 39.8 36.0 - 46.0 %   MCV 93.9 80.0 - 100.0 fL   MCH 30.2 26.0 - 34.0 pg   MCHC 32.2 30.0 - 36.0 g/dL   RDW 14.8 11.5 - 15.5 %   Platelets 305 150 - 400 K/uL   nRBC 0.0 0.0 - 0.2 %  Urinalysis, Routine w reflex microscopic  Result Value Ref Range   Color, Urine STRAW (A) YELLOW   APPearance CLEAR CLEAR   Specific Gravity, Urine 1.043 (H) 1.005 - 1.030   pH  6.0 5.0 - 8.0   Glucose, UA NEGATIVE NEGATIVE mg/dL   Hgb urine dipstick SMALL (A) NEGATIVE   Bilirubin Urine NEGATIVE NEGATIVE   Ketones, ur 20 (A) NEGATIVE mg/dL   Protein, ur NEGATIVE NEGATIVE mg/dL   Nitrite NEGATIVE NEGATIVE   Leukocytes,Ua NEGATIVE NEGATIVE   RBC / HPF 0-5 0 - 5 RBC/hpf   WBC, UA 0-5 0 - 5 WBC/hpf   Bacteria, UA NONE SEEN NONE SEEN   Squamous Epithelial / LPF 0-5 0 - 5  Differential  Result Value Ref Range   Neutrophils Relative % 74 %   Neutro Abs 9.5 (H) 1.7 - 7.7 K/uL   Lymphocytes Relative 16 %   Lymphs Abs 2.1 0.7 - 4.0 K/uL   Monocytes Relative 9 %   Monocytes Absolute  1.2 (H) 0.1 - 1.0 K/uL   Eosinophils Relative 1 %   Eosinophils Absolute 0.1 0.0 - 0.5 K/uL   Basophils Relative 0 %   Basophils Absolute 0.1 0.0 - 0.1 K/uL   Immature Granulocytes 0 %   Abs Immature Granulocytes 0.04 0.00 - 0.07 K/uL   Dg Chest 2 View Result Date: 05/20/2019 CLINICAL DATA:  Abdominal cramping with nausea fever. EXAM: CHEST - 2 VIEW COMPARISON:  02/26/2018 FINDINGS: Cardiopericardial silhouette is at upper limits of normal for size. Hiatal hernia noted. The lungs are clear without focal pneumonia, edema, pneumothorax or pleural effusion. Interstitial markings are diffusely coarsened with chronic features. The visualized bony structures of the thorax are intact. IMPRESSION: 1. No acute cardiopulmonary findings 2. Hiatal hernia. Electronically Signed   By: Misty Stanley M.D.   On: 05/20/2019 19:10   Ct Abdomen Pelvis W Contrast Result Date: 05/20/2019 CLINICAL DATA:  Abdominal pain with diarrhea. EXAM: CT ABDOMEN AND PELVIS WITH CONTRAST TECHNIQUE: Multidetector CT imaging of the abdomen and pelvis was performed using the standard protocol following bolus administration of intravenous contrast. CONTRAST:  16m OMNIPAQUE IOHEXOL 300 MG/ML  SOLN COMPARISON:  05/01/2016 FINDINGS: Lower chest:  Large hiatal hernia. Hepatobiliary: No suspicious focal abnormality within the liver  parenchyma. Gallbladder is surgically absent. No intrahepatic or extrahepatic biliary dilation. Pancreas: No focal mass lesion. No dilatation of the main duct. No intraparenchymal cyst. No peripancreatic edema. Spleen: No splenomegaly. No focal mass lesion. Adrenals/Urinary Tract: No adrenal nodule or mass. 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 Right kidney unremarkable. The urinary bladder appears normal for the degree of distention. Stomach/Bowel: Large hiatal hernia with approximately 75-80% of the stomach in the chest. Duodenum is normally positioned as is the ligament of Treitz. No small bowel wall thickening. No small bowel dilatation. Cecum is positioned in the anterior midline with unremarkable terminal ileum The appendix is not visualized, but there is no edema or inflammation in the region of the cecum. There is diffuse mild colonic wall thickening, most prominent in the sigmoid segment but extending from the ascending colon to the level of the rectum. This is associated with pericolonic edema/inflammation. Diverticular changes are noted in the left colon. Vascular/Lymphatic: There is abdominal aortic atherosclerosis without aneurysm. Celiac axis, SMA, and IMA are opacified. Portal vein and superior mesenteric vein are patent. There is no gastrohepatic or hepatoduodenal ligament lymphadenopathy. No intraperitoneal or retroperitoneal lymphadenopathy. No pelvic sidewall lymphadenopathy. Reproductive: The uterus is unremarkable.  There is no adnexal mass. Other: No intraperitoneal free fluid. Musculoskeletal: No worrisome lytic or sclerotic osseous abnormality. IMPRESSION: 1. Diffuse mild colonic wall thickening with pericolonic edema/inflammation. Imaging features are compatible with an infectious/inflammatory colitis. 2. Large hiatal hernia. 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 4.  Abdominal aortic atherosclerosis. Aortic Atherosclerosis (ICD10-I70.0). Electronically Signed   By: EMisty StanleyM.D.   On: 05/20/2019 19:06    Nixon L LLupewas evaluated in Emergency Department on 05/20/2019 for the symptoms described in the history of present illness. She was evaluated in the context of the global COVID-19 pandemic, which necessitated consideration that the patient might be at risk for infection with the SARS-CoV-2 virus that causes COVID-19. Institutional protocols and algorithms that pertain to the evaluation of patients at risk for COVID-19 are in a state of rapid change based on information released by regulatory bodies including the CDC and federal and state organizations. These policies  and algorithms were followed during the patient's care in the ED.   2205:  Pt orthostatic on VS and tachycardic even after IVF. Pt has had several stools while in the ED; stool studies pending. IV abx given for CT findings. Dx and testing d/w pt and family.  Questions answered.  Verb understanding, agreeable to admit. T/C returned from [facility] Dr. Darrick Meigs, case discussed, including:  HPI, pertinent PM/SHx, VS/PE, dx testing, ED course and treatment:  Agreeable to admit.      Final Clinical Impressions(s) / ED Diagnoses   Final diagnoses:  None    ED Discharge Orders    None       Francine Graven, DO 05/22/19 1543

## 2019-05-20 NOTE — ED Notes (Signed)
Pt ambulatory to bathroom with no assistance or distress noted.

## 2019-05-20 NOTE — ED Triage Notes (Signed)
Pt c/o abdominal cramping, nausea, diarrhea, & fever since Wednesday. Denies vomiting or GU symptoms.

## 2019-05-21 DIAGNOSIS — D6489 Other specified anemias: Secondary | ICD-10-CM | POA: Diagnosis not present

## 2019-05-21 DIAGNOSIS — K5100 Ulcerative (chronic) pancolitis without complications: Secondary | ICD-10-CM | POA: Diagnosis not present

## 2019-05-21 DIAGNOSIS — J449 Chronic obstructive pulmonary disease, unspecified: Secondary | ICD-10-CM | POA: Diagnosis present

## 2019-05-21 DIAGNOSIS — D500 Iron deficiency anemia secondary to blood loss (chronic): Secondary | ICD-10-CM | POA: Diagnosis not present

## 2019-05-21 DIAGNOSIS — Z96651 Presence of right artificial knee joint: Secondary | ICD-10-CM | POA: Diagnosis present

## 2019-05-21 DIAGNOSIS — M797 Fibromyalgia: Secondary | ICD-10-CM | POA: Diagnosis present

## 2019-05-21 DIAGNOSIS — E876 Hypokalemia: Secondary | ICD-10-CM | POA: Diagnosis not present

## 2019-05-21 DIAGNOSIS — K921 Melena: Secondary | ICD-10-CM | POA: Diagnosis not present

## 2019-05-21 DIAGNOSIS — R300 Dysuria: Secondary | ICD-10-CM | POA: Diagnosis not present

## 2019-05-21 DIAGNOSIS — F419 Anxiety disorder, unspecified: Secondary | ICD-10-CM | POA: Diagnosis present

## 2019-05-21 DIAGNOSIS — R05 Cough: Secondary | ICD-10-CM | POA: Diagnosis not present

## 2019-05-21 DIAGNOSIS — E440 Moderate protein-calorie malnutrition: Secondary | ICD-10-CM | POA: Diagnosis not present

## 2019-05-21 DIAGNOSIS — K5190 Ulcerative colitis, unspecified, without complications: Secondary | ICD-10-CM | POA: Diagnosis not present

## 2019-05-21 DIAGNOSIS — K51019 Ulcerative (chronic) pancolitis with unspecified complications: Secondary | ICD-10-CM | POA: Diagnosis not present

## 2019-05-21 DIAGNOSIS — K529 Noninfective gastroenteritis and colitis, unspecified: Secondary | ICD-10-CM

## 2019-05-21 DIAGNOSIS — Z6833 Body mass index (BMI) 33.0-33.9, adult: Secondary | ICD-10-CM | POA: Diagnosis not present

## 2019-05-21 DIAGNOSIS — H9191 Unspecified hearing loss, right ear: Secondary | ICD-10-CM | POA: Diagnosis present

## 2019-05-21 DIAGNOSIS — I959 Hypotension, unspecified: Secondary | ICD-10-CM | POA: Diagnosis not present

## 2019-05-21 DIAGNOSIS — Z9049 Acquired absence of other specified parts of digestive tract: Secondary | ICD-10-CM | POA: Diagnosis not present

## 2019-05-21 DIAGNOSIS — Z20828 Contact with and (suspected) exposure to other viral communicable diseases: Secondary | ICD-10-CM | POA: Diagnosis not present

## 2019-05-21 DIAGNOSIS — K5130 Ulcerative (chronic) rectosigmoiditis without complications: Secondary | ICD-10-CM | POA: Diagnosis not present

## 2019-05-21 DIAGNOSIS — K51011 Ulcerative (chronic) pancolitis with rectal bleeding: Secondary | ICD-10-CM | POA: Diagnosis not present

## 2019-05-21 DIAGNOSIS — E860 Dehydration: Secondary | ICD-10-CM | POA: Diagnosis not present

## 2019-05-21 DIAGNOSIS — R197 Diarrhea, unspecified: Secondary | ICD-10-CM | POA: Diagnosis not present

## 2019-05-21 DIAGNOSIS — G4730 Sleep apnea, unspecified: Secondary | ICD-10-CM | POA: Diagnosis present

## 2019-05-21 DIAGNOSIS — K51919 Ulcerative colitis, unspecified with unspecified complications: Secondary | ICD-10-CM | POA: Diagnosis not present

## 2019-05-21 DIAGNOSIS — A0472 Enterocolitis due to Clostridium difficile, not specified as recurrent: Secondary | ICD-10-CM | POA: Diagnosis not present

## 2019-05-21 DIAGNOSIS — K219 Gastro-esophageal reflux disease without esophagitis: Secondary | ICD-10-CM | POA: Diagnosis present

## 2019-05-21 DIAGNOSIS — E119 Type 2 diabetes mellitus without complications: Secondary | ICD-10-CM | POA: Diagnosis not present

## 2019-05-21 DIAGNOSIS — R000 Tachycardia, unspecified: Secondary | ICD-10-CM | POA: Diagnosis not present

## 2019-05-21 DIAGNOSIS — Z9089 Acquired absence of other organs: Secondary | ICD-10-CM | POA: Diagnosis not present

## 2019-05-21 DIAGNOSIS — I482 Chronic atrial fibrillation, unspecified: Secondary | ICD-10-CM | POA: Diagnosis not present

## 2019-05-21 DIAGNOSIS — F329 Major depressive disorder, single episode, unspecified: Secondary | ICD-10-CM | POA: Diagnosis not present

## 2019-05-21 LAB — CBC
HCT: 37.6 % (ref 36.0–46.0)
Hemoglobin: 11.8 g/dL — ABNORMAL LOW (ref 12.0–15.0)
MCH: 29.4 pg (ref 26.0–34.0)
MCHC: 31.4 g/dL (ref 30.0–36.0)
MCV: 93.8 fL (ref 80.0–100.0)
Platelets: 233 10*3/uL (ref 150–400)
RBC: 4.01 MIL/uL (ref 3.87–5.11)
RDW: 15 % (ref 11.5–15.5)
WBC: 10.9 10*3/uL — ABNORMAL HIGH (ref 4.0–10.5)
nRBC: 0 % (ref 0.0–0.2)

## 2019-05-21 LAB — COMPREHENSIVE METABOLIC PANEL
ALT: 12 U/L (ref 0–44)
AST: 14 U/L — ABNORMAL LOW (ref 15–41)
Albumin: 3.2 g/dL — ABNORMAL LOW (ref 3.5–5.0)
Alkaline Phosphatase: 79 U/L (ref 38–126)
Anion gap: 8 (ref 5–15)
BUN: 6 mg/dL — ABNORMAL LOW (ref 8–23)
CO2: 24 mmol/L (ref 22–32)
Calcium: 8.4 mg/dL — ABNORMAL LOW (ref 8.9–10.3)
Chloride: 105 mmol/L (ref 98–111)
Creatinine, Ser: 0.79 mg/dL (ref 0.44–1.00)
GFR calc Af Amer: >60 mL/min (ref >60)
GFR calc non Af Amer: >60 mL/min (ref >60)
Glucose, Bld: 125 mg/dL — ABNORMAL HIGH (ref 70–99)
Potassium: 3.4 mmol/L — ABNORMAL LOW (ref 3.5–5.1)
Sodium: 137 mmol/L (ref 135–145)
Total Bilirubin: 0.6 mg/dL (ref 0.3–1.2)
Total Protein: 6.6 g/dL (ref 6.5–8.1)

## 2019-05-21 LAB — SARS CORONAVIRUS 2 BY RT PCR (HOSPITAL ORDER, PERFORMED IN ~~LOC~~ HOSPITAL LAB): SARS Coronavirus 2: NEGATIVE

## 2019-05-21 LAB — SARS CORONAVIRUS 2 (TAT 6-24 HRS): SARS Coronavirus 2: NEGATIVE

## 2019-05-21 MED ORDER — PRAVASTATIN SODIUM 10 MG PO TABS
20.0000 mg | ORAL_TABLET | Freq: Every day | ORAL | Status: DC
  Administered 2019-05-21 – 2019-06-15 (×26): 20 mg via ORAL
  Filled 2019-05-21 (×26): qty 2

## 2019-05-21 MED ORDER — BALSALAZIDE DISODIUM 750 MG PO CAPS: 2250.0000 mg | ORAL_CAPSULE | Freq: Two times a day (BID) | ORAL | Status: DC

## 2019-05-21 MED ORDER — APIXABAN 5 MG PO TABS
5.0000 mg | ORAL_TABLET | Freq: Two times a day (BID) | ORAL | Status: DC
  Administered 2019-05-21 – 2019-05-30 (×19): 5 mg via ORAL
  Filled 2019-05-21 (×19): qty 1

## 2019-05-21 MED ORDER — METOPROLOL TARTRATE 25 MG PO TABS
12.5000 mg | ORAL_TABLET | Freq: Two times a day (BID) | ORAL | Status: DC
  Administered 2019-05-21 – 2019-05-31 (×21): 12.5 mg via ORAL
  Filled 2019-05-21 (×21): qty 1

## 2019-05-21 MED ORDER — ONDANSETRON HCL 4 MG PO TABS: 4.0000 mg | ORAL_TABLET | Freq: Four times a day (QID) | ORAL | Status: DC | PRN

## 2019-05-21 MED ORDER — CIPROFLOXACIN IN D5W 400 MG/200ML IV SOLN
400.0000 mg | Freq: Two times a day (BID) | INTRAVENOUS | Status: DC
  Administered 2019-05-21 – 2019-05-22 (×3): 400 mg via INTRAVENOUS
  Filled 2019-05-21 (×3): qty 200

## 2019-05-21 MED ORDER — MOMETASONE FURO-FORMOTEROL FUM 200-5 MCG/ACT IN AERO
2.0000 | INHALATION_SPRAY | Freq: Two times a day (BID) | RESPIRATORY_TRACT | Status: DC
  Administered 2019-05-21 – 2019-06-15 (×51): 2 via RESPIRATORY_TRACT
  Filled 2019-05-21 (×2): qty 8.8

## 2019-05-21 MED ORDER — BUPROPION HCL ER (XL) 150 MG PO TB24
150.0000 mg | ORAL_TABLET | Freq: Every day | ORAL | Status: DC
  Administered 2019-05-21 – 2019-06-15 (×25): 150 mg via ORAL
  Filled 2019-05-21 (×26): qty 1

## 2019-05-21 MED ORDER — CALCIUM CARBONATE ANTACID 500 MG PO CHEW
1.0000 | CHEWABLE_TABLET | Freq: Three times a day (TID) | ORAL | Status: DC
  Administered 2019-05-21 – 2019-05-24 (×10): 200 mg via ORAL
  Filled 2019-05-21 (×10): qty 1

## 2019-05-21 MED ORDER — PREGABALIN 50 MG PO CAPS
100.0000 mg | ORAL_CAPSULE | Freq: Two times a day (BID) | ORAL | Status: DC
  Administered 2019-05-21 – 2019-06-15 (×51): 100 mg via ORAL
  Filled 2019-05-21 (×26): qty 2
  Filled 2019-05-21: qty 4
  Filled 2019-05-21 (×24): qty 2

## 2019-05-21 MED ORDER — ALBUTEROL SULFATE (2.5 MG/3ML) 0.083% IN NEBU: 2.5000 mg | INHALATION_SOLUTION | Freq: Four times a day (QID) | RESPIRATORY_TRACT | Status: DC | PRN

## 2019-05-21 MED ORDER — SODIUM CHLORIDE 0.9 % IV SOLN
INTRAVENOUS | Status: DC
  Administered 2019-05-21 – 2019-05-24 (×4): via INTRAVENOUS

## 2019-05-21 MED ORDER — DICYCLOMINE HCL 10 MG PO CAPS: 10.0000 mg | ORAL_CAPSULE | Freq: Four times a day (QID) | ORAL | Status: DC

## 2019-05-21 MED ORDER — PANTOPRAZOLE SODIUM 40 MG PO TBEC
40.0000 mg | DELAYED_RELEASE_TABLET | Freq: Every day | ORAL | Status: DC
  Administered 2019-05-21 – 2019-05-22 (×2): 40 mg via ORAL
  Filled 2019-05-21 (×3): qty 1

## 2019-05-21 MED ORDER — BALSALAZIDE DISODIUM 750 MG PO CAPS
2250.0000 mg | ORAL_CAPSULE | Freq: Two times a day (BID) | ORAL | Status: DC
  Administered 2019-05-22 – 2019-05-24 (×4): 2250 mg via ORAL
  Filled 2019-05-21 (×12): qty 3

## 2019-05-21 MED ORDER — METRONIDAZOLE IN NACL 5-0.79 MG/ML-% IV SOLN
500.0000 mg | Freq: Three times a day (TID) | INTRAVENOUS | Status: DC
  Administered 2019-05-21 – 2019-05-22 (×4): 500 mg via INTRAVENOUS
  Filled 2019-05-21 (×4): qty 100

## 2019-05-21 MED ORDER — KETOTIFEN FUMARATE 0.025 % OP SOLN
1.0000 [drp] | Freq: Every day | OPHTHALMIC | Status: DC
  Administered 2019-05-22 – 2019-06-15 (×25): 1 [drp] via OPHTHALMIC
  Filled 2019-05-21: qty 5

## 2019-05-21 MED ORDER — TRAZODONE HCL 50 MG PO TABS
100.0000 mg | ORAL_TABLET | Freq: Every day | ORAL | Status: DC
  Administered 2019-05-21 – 2019-06-14 (×25): 100 mg via ORAL
  Filled 2019-05-21 (×25): qty 2

## 2019-05-21 MED ORDER — DULOXETINE HCL 60 MG PO CPEP
120.0000 mg | ORAL_CAPSULE | Freq: Every day | ORAL | Status: DC
  Administered 2019-05-21 – 2019-06-15 (×26): 120 mg via ORAL
  Filled 2019-05-21 (×26): qty 2

## 2019-05-21 MED ORDER — ACETAMINOPHEN 500 MG PO TABS
1000.0000 mg | ORAL_TABLET | Freq: Four times a day (QID) | ORAL | Status: DC | PRN
  Administered 2019-05-21 – 2019-06-10 (×6): 1000 mg via ORAL
  Filled 2019-05-21 (×6): qty 2

## 2019-05-21 MED ORDER — FERROUS SULFATE 325 (65 FE) MG PO TABS: 325.0000 mg | ORAL_TABLET | Freq: Every day | ORAL | Status: DC

## 2019-05-21 MED ORDER — BUSPIRONE HCL 5 MG PO TABS
7.5000 mg | ORAL_TABLET | Freq: Two times a day (BID) | ORAL | Status: DC
  Administered 2019-05-21 – 2019-06-15 (×51): 7.5 mg via ORAL
  Filled 2019-05-21 (×51): qty 2

## 2019-05-21 MED ORDER — ONDANSETRON HCL 4 MG/2ML IJ SOLN: 4.0000 mg | Freq: Four times a day (QID) | INTRAMUSCULAR | Status: DC | PRN

## 2019-05-21 MED ORDER — LORATADINE 10 MG PO TABS
10.0000 mg | ORAL_TABLET | Freq: Every day | ORAL | Status: DC
  Administered 2019-05-21 – 2019-06-15 (×26): 10 mg via ORAL
  Filled 2019-05-21 (×26): qty 1

## 2019-05-21 NOTE — Progress Notes (Signed)
Patient seen and examined.  Admitted after midnight secondary to abdominal pain and diarrhea.  Also reports an intermittent episode of nausea.  No vomiting.  Hemodynamically stable at this moment.  Imaging studies demonstrated pancolitis (differential diagnosis includes ulcerative colitis flare versus infectious colitis).  Please refer to H&P written by Dr. Almeta Monas for further info/details on admission.  Plan: -Continue IV fluids and supportive care -Follow GI service recommendations -Advance diet to full liquid  -Continue IV antibiotics -Follow stool studies; if his stool studies negative for infectious patient will most likely benefit of steroids therapy to assist with her inflammatory colitis flare.  Barton Dubois MD [phone]

## 2019-05-21 NOTE — ED Notes (Signed)
ED TO INPATIENT HANDOFF REPORT  ED Nurse Name and Phone #:  Antony Blackbird, RN [phone]  S Name/Age/Gender [name] 68 y.o. female Room/Bed: APA12/APA12  Code Status   Code Status: Not on file  Home/SNF/Other Home Patient oriented to: self, place, time and situation Is this baseline? Yes   Triage Complete: Triage complete  Chief Complaint DIARRHEA,FEVER  Triage Note Pt c/o abdominal cramping, nausea, diarrhea, & fever since Wednesday. Denies vomiting or GU symptoms.   Allergies Allergies  Allergen Reactions  . Mesalamine Other (See Comments)    Pancreatitis   . Cefprozil Other (See Comments)    Aggravates Ulcerative colitis   . Cefuroxime Axetil Other (See Comments)    Aggravates Ulcerative colitis   . Morphine And Related Hives and Itching  . Penicillins Hives    Has patient had a PCN reaction causing immediate rash, facial/tongue/throat swelling, SOB or lightheadedness with hypotension: No Has patient had a PCN reaction causing severe rash involving mucus membranes or skin necrosis: No Has patient had a PCN reaction that required hospitalization No Has patient had a PCN reaction occurring within the last 10 years: yes If all of the above answers are "NO", then may proceed with Cephalosporin use.   . Aspirin Other (See Comments)    Affects the central nervous system in high doses . "shakey"  . Chicken Allergy Nausea And Vomiting and Rash  . Clarithromycin Nausea And Vomiting  . Codeine Other (See Comments)    Hallucinations/bad dreams.  . Entex Other (See Comments)    insomnia    Level of Care/Admitting Diagnosis ED Disposition    ED Disposition Condition [facility] Area: [facility] [[id number]]  Level of Care: Telemetry [5]  Covid Evaluation: Asymptomatic Screening Protocol (No Symptoms)  Diagnosis: Colitis [[id number]]  Admitting Physician: Loree Fee  Attending Physician: Oswald Hillock [[id number]]  Estimated length of stay: 3  - 4 days  Certification:: I certify this patient will need inpatient services for at least 2 midnights  PT Class (Do Not Modify): Inpatient [101]  PT Acc Code (Do Not Modify): Private [1]       B Medical/Surgery History Past Medical History:  Diagnosis Date  . Anemia   . Anginal pain ([place])    PATIENT STATES DOCT THINKS IS FIBROMYALGIA  . Arthritis   . Asthma   . Atrial fibrillation ([place])   . Blood clot in lung last march  . Cancer ([place])    1980  CERVICAL  . Chronic back pain   . COPD (chronic obstructive pulmonary disease) ([place])   . DDD (degenerative disc disease)   . Degenerative tear of posterior horn of lateral meniscus of right knee 01/27/2014  . Depression   . Diabetes mellitus without complication ([place])   . Diverticulitis   . Fibromyalgia   . GERD (gastroesophageal reflux disease)   . Headache    HX MIGRAINES    . Hiatal hernia   . HOH (hard of hearing)    right  . Pancreatitis   . Pneumonia    1 YR  . PONV (postoperative nausea and vomiting)    only happened after knee arthroscopy  . Primary localized osteoarthritis of right knee 12/11/2015  . PVC's (premature ventricular contractions)   . Schatzki's ring   . Seizures ([place])    had febrile seizures as child; none since childhood and on no meds  . Shortness of breath dyspnea    WITH EXERTION   .  Sleep apnea    CPAP at night  . Ulcerative colitis 2007  . Wears dentures    full top-partial bottom   Past Surgical History:  Procedure Laterality Date  . arch and foot    . BALLOON DILATION  11/17/2018   Procedure: BALLOON DILATION;  Surgeon: Daneil Dolin, MD;  [facility];  Service: Endoscopy;;  esophagus  . CERVICAL CONE BIOPSY    . CHOLECYSTECTOMY    . COLONOSCOPY  12/03/05   Rourk-marked inflammatory changes of the rectum, left colon, pan colitis, internal hemorrhoids, sigmoid diverticula  . COLONOSCOPY N/A 10/20/2013   Dr. Rourk:Colonic diverticulosis. colonic mucosal ulcerations involving  segment of sigmoid colon, atypical for UC. sigmoid colon path with chronic mildly active colitis consistent with IBD, other biopsies including ascending, transverse, descending colon and rectum unremarkable.   . COLONOSCOPY N/A 11/19/2016   Dr. Gala Romney: Diverticulosis, 7 millimeter polyp removed from the descending colon, internal hemorrhoids, segmental biopsies unremarkable.  Marland Kitchen DILATION AND CURETTAGE OF UTERUS    . ESOPHAGEAL DILATION N/A 09/20/2015   Procedure: ESOPHAGEAL DILATION;  Surgeon: Daneil Dolin, MD;  [facility];  Service: Endoscopy;  Laterality: N/A;  . ESOPHAGOGASTRODUODENOSCOPY  01/28/2011   Rourk- Schatzki ring, s/p 60F, otherwise normal/Hiatal hernia otherwise normal stomach D1 and D2  . ESOPHAGOGASTRODUODENOSCOPY  11/30/08   Rourk-Schatzki's ring status post dilation, hiatal hernia  . ESOPHAGOGASTRODUODENOSCOPY  07/12/2007   Dilation with 56 French, Schatzki's ring  . ESOPHAGOGASTRODUODENOSCOPY N/A 08/10/2014   Dr.Rourk- prominent schatzki's ring s/p maloney dilation and disruption, moderate sized hiatal hernia  . ESOPHAGOGASTRODUODENOSCOPY N/A 09/20/2015   RMR: Schatzki's ring status post dilation as described above. Rather large hiatal hernia; somewhat baggy atonic esophageal body' otherwise normal EGD  . ESOPHAGOGASTRODUODENOSCOPY N/A 11/19/2016   Dr. Gala Romney: moderate Schatzki ring s/p dirsuption with biopsy forceps, large hiatal hernia  . ESOPHAGOGASTRODUODENOSCOPY N/A 11/17/2018   Procedure: ESOPHAGOGASTRODUODENOSCOPY (EGD);  Surgeon: Daneil Dolin, MD;  [facility];  Service: Endoscopy;  Laterality: N/A;  12:00PM  . ESOPHAGOGASTRODUODENOSCOPY (EGD) WITH ESOPHAGEAL DILATION  09/01/2012   Dr. Gala Romney- schatzki's ring, hiatal hernia  . ESOPHAGOGASTRODUODENOSCOPY (EGD) WITH PROPOFOL N/A 02/08/2018   Procedure: ESOPHAGOGASTRODUODENOSCOPY (EGD) WITH PROPOFOL;  Surgeon: Daneil Dolin, MD;  [facility];  Service: Endoscopy;  Laterality: N/A;   10:30am  . FOOT ARTHRODESIS  [id number]   both feet  . HERNIA REPAIR     umbilical  . JOINT REPLACEMENT Right   . KNEE ARTHROSCOPY WITH LATERAL MENISECTOMY Right 01/27/2014   Procedure: RIGHT KNEE ARTHROSCOPY WITH PARTIAL LATERAL MENISECTOMY, ;  Surgeon: Johnny Bridge, MD;  Location: [facility];  Service: Orthopedics;  Laterality: Right;  . MALONEY DILATION N/A 08/10/2014   Procedure: [name] DILATION;  Surgeon: Daneil Dolin, MD;  [facility];  Service: Endoscopy;  Laterality: N/A;  [name] DILATION N/A 11/19/2016   Procedure: [name] DILATION;  Surgeon: Daneil Dolin, MD;  [facility];  Service: Endoscopy;  Laterality: N/A;  . [name] DILATION N/A 02/08/2018   Procedure: [name] DILATION;  Surgeon: Daneil Dolin, MD;  [facility];  Service: Endoscopy;  Laterality: N/A;  [name] DILATION N/A 11/17/2018   Procedure: [name] DILATION;  Surgeon: Daneil Dolin, MD;  [facility];  Service: Endoscopy;  Laterality: N/A;  . SAVORY DILATION N/A 08/10/2014   Procedure: SAVORY DILATION;  Surgeon: Daneil Dolin, MD;  [facility];  Service: Endoscopy;  Laterality: N/A;  . SHOULDER OPEN ROTATOR CUFF REPAIR     BILAT  . TONSILLECTOMY    . TOTAL KNEE ARTHROPLASTY Right 12/11/2015   Procedure: TOTAL KNEE ARTHROPLASTY;  Surgeon: Marchia Bond, MD;  Location: [facility];  Service: Orthopedics;  Laterality: Right;     A IV Location/Drains/Wounds Patient Lines/Drains/Airways Status   Active Line/Drains/Airways    Name:   Placement date:   Placement time:   Site:   Days:   Peripheral IV 05/20/19 Left Forearm   05/20/19    1817    Forearm   1   Incision (Closed) 01/27/14 Knee Right   01/27/14    0819     1940   Incision (Closed) 12/11/15 Knee Right   12/11/15    1323     1257          Intake/Output Last 24 hours No intake or output data in the 24 hours ending 05/21/19 0158  Labs/Imaging Results for orders placed or  performed during the hospital encounter of 05/20/19 (from the past 48 hour(s))  Lipase, blood     Status: None   Collection Time: 05/20/19  4:04 PM  Result Value Ref Range   Lipase 16 11 - 51 U/L    Comment: Performed at [facility], [address] [place], [place] [address]  Comprehensive metabolic panel     Status: Abnormal   Collection Time: 05/20/19  4:04 PM  Result Value Ref Range   Sodium 134 (L) 135 - 145 mmol/L   Potassium 3.6 3.5 - 5.1 mmol/L   Chloride 101 98 - 111 mmol/L   CO2 25 22 - 32 mmol/L   Glucose, Bld 125 (H) 70 - 99 mg/dL   BUN 7 (L) 8 - 23 mg/dL   Creatinine, Ser 0.80 0.44 - 1.00 mg/dL   Calcium 8.8 (L) 8.9 - 10.3 mg/dL   Total Protein 7.2 6.5 - 8.1 g/dL   Albumin 3.5 3.5 - 5.0 g/dL   AST 13 (L) 15 - 41 U/L   ALT 13 0 - 44 U/L   Alkaline Phosphatase 88 38 - 126 U/L   Total Bilirubin 0.7 0.3 - 1.2 mg/dL   GFR calc non Af Amer >60 >60 mL/min   GFR calc Af Amer >60 >60 mL/min   Anion gap 8 5 - 15    Comment: Performed at [facility], [address] [place], [place] [address]  CBC     Status: Abnormal   Collection Time: 05/20/19  4:04 PM  Result Value Ref Range   WBC 13.4 (H) 4.0 - 10.5 K/uL   RBC 4.24 3.87 - 5.11 MIL/uL   Hemoglobin 12.8 12.0 - 15.0 g/dL   HCT 39.8 36.0 - 46.0 %   MCV 93.9 80.0 - 100.0 fL   MCH 30.2 26.0 - 34.0 pg   MCHC 32.2 30.0 - 36.0 g/dL   RDW 14.8 11.5 - 15.5 %   Platelets 305 150 - 400 K/uL   nRBC 0.0 0.0 - 0.2 %    Comment: Performed at [facility], [address], [place], [place] [address]  Differential     Status: Abnormal   Collection Time: 05/20/19  4:04 PM  Result Value Ref Range   Neutrophils Relative % 74 %   Neutro Abs 9.5 (H) 1.7 - 7.7 K/uL   Lymphocytes Relative 16 %   Lymphs Abs 2.1 0.7 - 4.0 K/uL   Monocytes Relative 9 %   Monocytes Absolute 1.2 (H) 0.1 - 1.0  K/uL   Eosinophils Relative 1 %   Eosinophils Absolute 0.1 0.0 - 0.5 K/uL   Basophils Relative 0 %   Basophils Absolute 0.1 0.0 - 0.1 K/uL   Immature  Granulocytes 0 %   Abs Immature Granulocytes 0.04 0.00 - 0.07 K/uL    Comment: Performed at [facility], [address] [place], [place] [address]  Urinalysis, Routine w reflex microscopic     Status: Abnormal   Collection Time: 05/20/19  9:03 PM  Result Value Ref Range   Color, Urine STRAW (A) YELLOW   APPearance CLEAR CLEAR   Specific Gravity, Urine 1.043 (H) 1.005 - 1.030   pH 6.0 5.0 - 8.0   Glucose, UA NEGATIVE NEGATIVE mg/dL   Hgb urine dipstick SMALL (A) NEGATIVE   Bilirubin Urine NEGATIVE NEGATIVE   Ketones, ur 20 (A) NEGATIVE mg/dL   Protein, ur NEGATIVE NEGATIVE mg/dL   Nitrite NEGATIVE NEGATIVE   Leukocytes,Ua NEGATIVE NEGATIVE   RBC / HPF 0-5 0 - 5 RBC/hpf   WBC, UA 0-5 0 - 5 WBC/hpf   Bacteria, UA NONE SEEN NONE SEEN   Squamous Epithelial / LPF 0-5 0 - 5    Comment: Performed at [facility], [address] [place], [place] [address]   Dg Chest 2 View  Result Date: 05/20/2019 CLINICAL DATA:  Abdominal cramping with nausea fever. EXAM: CHEST - 2 VIEW COMPARISON:  02/26/2018 FINDINGS: Cardiopericardial silhouette is at upper limits of normal for size. Hiatal hernia noted. The lungs are clear without focal pneumonia, edema, pneumothorax or pleural effusion. Interstitial markings are diffusely coarsened with chronic features. The visualized bony structures of the thorax are intact. IMPRESSION: 1. No acute cardiopulmonary findings 2. Hiatal hernia. Electronically Signed   By: Misty Stanley M.D.   On: 05/20/2019 19:10   Ct Abdomen Pelvis W Contrast  Result Date: 05/20/2019 CLINICAL DATA:  Abdominal pain with diarrhea. EXAM: CT ABDOMEN AND PELVIS WITH CONTRAST TECHNIQUE: Multidetector CT imaging of the abdomen and pelvis was performed using the standard protocol following bolus administration of intravenous contrast. CONTRAST:  154m OMNIPAQUE IOHEXOL 300 MG/ML  SOLN COMPARISON:  05/01/2016 FINDINGS: Lower chest:  Large hiatal hernia. Hepatobiliary: No suspicious focal abnormality  within the liver parenchyma. Gallbladder is surgically absent. No intrahepatic or extrahepatic biliary dilation. Pancreas: No focal mass lesion. No dilatation of the main duct. No intraparenchymal cyst. No peripancreatic edema. Spleen: No splenomegaly. No focal mass lesion. Adrenals/Urinary Tract: No adrenal nodule or mass. 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 Right kidney unremarkable. The urinary bladder appears normal for the degree of distention. Stomach/Bowel: Large hiatal hernia with approximately 75-80% of the stomach in the chest. Duodenum is normally positioned as is the ligament of Treitz. No small bowel wall thickening. No small bowel dilatation. Cecum is positioned in the anterior midline with unremarkable terminal ileum The appendix is not visualized, but there is no edema or inflammation in the region of the cecum. There is diffuse mild colonic wall thickening, most prominent in the sigmoid segment but extending from the ascending colon to the level of the rectum. This is associated with pericolonic edema/inflammation. Diverticular changes are noted in the left colon. Vascular/Lymphatic: There is abdominal aortic atherosclerosis without aneurysm. Celiac axis, SMA, and IMA are opacified. Portal vein and superior mesenteric vein are patent. There is no gastrohepatic or hepatoduodenal ligament lymphadenopathy. No intraperitoneal or retroperitoneal lymphadenopathy. No pelvic sidewall lymphadenopathy. Reproductive: The uterus is unremarkable.  There is no adnexal  mass. Other: No intraperitoneal free fluid. Musculoskeletal: No worrisome lytic or sclerotic osseous abnormality. IMPRESSION: 1. Diffuse mild colonic wall thickening with pericolonic edema/inflammation. Imaging features are compatible with an infectious/inflammatory colitis. 2. Large hiatal hernia. 3. 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 4. Abdominal aortic atherosclerosis. Aortic Atherosclerosis (ICD10-I70.0). Electronically Signed   By: Misty Stanley M.D.   On: 05/20/2019 19:06    Pending Labs Unresulted Labs (From admission, onward)    Start     Ordered   05/20/19 2144  C difficile quick scan w PCR reflex  (C Difficile quick screen w PCR reflex panel)  Once, for 24 hours,   STAT     05/20/19 2143   05/20/19 2144  SARS CORONAVIRUS 2 Nasal Swab Aptima Multi Swab  (Asymptomatic/Tier 2 Patients Labs)  Once,   STAT    Question Answer Comment  Is this test for diagnosis or screening Screening   Symptomatic for COVID-19 as defined by CDC No   Hospitalized for COVID-19 No   Admitted to ICU for COVID-19 No   Previously tested for COVID-19 No   Resident in a congregate (group) care setting No   Employed in healthcare setting No   Pregnant No      05/20/19 2143   05/20/19 2143  Gastrointestinal Panel by PCR , Stool  (Gastrointestinal Panel by PCR, Stool)  Once,   STAT     05/20/19 2143   05/20/19 1724  Urine culture  ONCE - STAT,   STAT     05/20/19 1724   Signed and Held  HIV antibody (Routine Testing)  Once,   R     Signed and Held   Signed and Held  CBC  Tomorrow morning,   R     Signed and Held   Signed and Held  Comprehensive metabolic panel  Tomorrow morning,   R     Signed and Held          Vitals/Pain Today's Vitals   05/20/19 2116 05/20/19 2130 05/20/19 2200 05/21/19 0153  BP: (!) 144/90 139/72 121/82 123/75  Pulse: (!) 116   (!) 102  Resp: 20   18  Temp:      TempSrc:      SpO2: 96%   97%  Weight:      Height:      PainSc: 9        Isolation Precautions Enteric precautions (UV disinfection)  Medications Medications  sodium chloride 0.9 % bolus 500 mL (0 mLs Intravenous Stopped 05/20/19 2056)  iohexol (OMNIPAQUE) 300 MG/ML solution 100 mL (100 mLs Intravenous Contrast Given 05/20/19 1832)  ciprofloxacin (CIPRO) IVPB 400 mg (0 mg Intravenous Stopped 05/20/19 2147)  metroNIDAZOLE  (FLAGYL) IVPB 500 mg (0 mg Intravenous Stopped 05/20/19 2148)    Mobility walks Low fall risk   Focused Assessments    R Recommendations: See Admitting Provider Note  Report given to:   Additional Notes:

## 2019-05-21 NOTE — H&P (Addendum)
TRH H&P    Patient Demographics:    [name], is a 68 y.o. female  MRN: [id number]  DOB - Apr 15, 1951  Admit Date - 05/20/2019  Referring MD/NP/PA: Thurnell Garbe  Outpatient Primary MD for the patient is Jalene Mullet, PA-C  Patient coming from: Home  Chief complaint-diarrhea   HPI:    [name]  is a 68 y.o. female, with history of ulcerative colitis recent diagnosis of ulcerative pancolitis in June 2020, followed by GI as outpatient, atrial fibrillation on anticoagulation with Eliquis, COPD came to hospital with complaints of abdominal pain and diarrhea.  Patient says that she had multiple loose bowel movements.  Also generalized abdominal pain.  CT scan of the abdomen pelvis done in the ED shows diffuse pancolitis infectious versus inflammatory.  She was recently seen by gastroenterology as outpatient. Denies vomiting but complains of nausea Denies chest pain or shortness of breath. Complains of fever, but has been afebrile in the hospital. Lab work showed WBC 13.4 Patient empirically started on Cipro and Flagyl. C. difficile PCR and GI pathogen panel ordered.    Review of systems:    In addition to the HPI above,    All other systems reviewed and are negative.    Past History of the following :    Past Medical History:  Diagnosis Date   Anemia    Anginal pain ([place])    PATIENT STATES DOCT THINKS IS FIBROMYALGIA   Arthritis    Asthma    Atrial fibrillation (HCC)    Blood clot in lung last march   Cancer [facility])    1980  CERVICAL   Chronic back pain    COPD (chronic obstructive pulmonary disease) (HCC)    DDD (degenerative disc disease)    Degenerative tear of posterior horn of lateral meniscus of right knee 01/27/2014   Depression    Diabetes mellitus without complication (HCC)    Diverticulitis    Fibromyalgia    GERD (gastroesophageal reflux disease)    Headache     HX MIGRAINES     Hiatal hernia    HOH (hard of hearing)    right   Pancreatitis    Pneumonia    1 YR   PONV (postoperative nausea and vomiting)    only happened after knee arthroscopy   Primary localized osteoarthritis of right knee 12/11/2015   PVC's (premature ventricular contractions)    Schatzki's ring    Seizures ([place])    had febrile seizures as child; none since childhood and on no meds   Shortness of breath dyspnea    WITH EXERTION    Sleep apnea    CPAP at night   Ulcerative colitis 2007   Wears dentures    full top-partial bottom      Past Surgical History:  Procedure Laterality Date   arch and foot     BALLOON DILATION  11/17/2018   Procedure: BALLOON DILATION;  Surgeon: Daneil Dolin, MD;  [facility];  Service: Endoscopy;;  esophagus   CERVICAL CONE BIOPSY  CHOLECYSTECTOMY     COLONOSCOPY  12/03/05   Rourk-marked inflammatory changes of the rectum, left colon, pan colitis, internal hemorrhoids, sigmoid diverticula   COLONOSCOPY N/A 10/20/2013   Dr. Rourk:Colonic diverticulosis. colonic mucosal ulcerations involving segment of sigmoid colon, atypical for UC. sigmoid colon path with chronic mildly active colitis consistent with IBD, other biopsies including ascending, transverse, descending colon and rectum unremarkable.    COLONOSCOPY N/A 11/19/2016   Dr. Gala Romney: Diverticulosis, 7 millimeter polyp removed from the descending colon, internal hemorrhoids, segmental biopsies unremarkable.   DILATION AND CURETTAGE OF UTERUS     ESOPHAGEAL DILATION N/A 09/20/2015   Procedure: ESOPHAGEAL DILATION;  Surgeon: Daneil Dolin, MD;  [facility];  Service: Endoscopy;  Laterality: N/A;   ESOPHAGOGASTRODUODENOSCOPY  01/28/2011   Rourk- Schatzki ring, s/p 8F, otherwise normal/Hiatal hernia otherwise normal stomach D1 and D2   ESOPHAGOGASTRODUODENOSCOPY  11/30/08   Rourk-Schatzki's ring status post dilation, hiatal hernia    ESOPHAGOGASTRODUODENOSCOPY  07/12/2007   Dilation with 39 French, Schatzki's ring   ESOPHAGOGASTRODUODENOSCOPY N/A 08/10/2014   Dr.Rourk- prominent schatzki's ring s/p maloney dilation and disruption, moderate sized hiatal hernia   ESOPHAGOGASTRODUODENOSCOPY N/A 09/20/2015   RMR: Schatzki's ring status post dilation as described above. Rather large hiatal hernia; somewhat baggy atonic esophageal body' otherwise normal EGD   ESOPHAGOGASTRODUODENOSCOPY N/A 11/19/2016   Dr. Gala Romney: moderate Schatzki ring s/p dirsuption with biopsy forceps, large hiatal hernia   ESOPHAGOGASTRODUODENOSCOPY N/A 11/17/2018   Procedure: ESOPHAGOGASTRODUODENOSCOPY (EGD);  Surgeon: Daneil Dolin, MD;  [facility];  Service: Endoscopy;  Laterality: N/A;  12:00PM   ESOPHAGOGASTRODUODENOSCOPY (EGD) WITH ESOPHAGEAL DILATION  09/01/2012   Dr. Gala Romney- schatzki's ring, hiatal hernia   ESOPHAGOGASTRODUODENOSCOPY (EGD) WITH PROPOFOL N/A 02/08/2018   Procedure: ESOPHAGOGASTRODUODENOSCOPY (EGD) WITH PROPOFOL;  Surgeon: Daneil Dolin, MD;  [facility];  Service: Endoscopy;  Laterality: N/A;  10:30am   FOOT ARTHRODESIS  2007,2010   both feet   HERNIA REPAIR     umbilical   JOINT REPLACEMENT Right    KNEE ARTHROSCOPY WITH LATERAL MENISECTOMY Right 01/27/2014   Procedure: RIGHT KNEE ARTHROSCOPY WITH PARTIAL LATERAL MENISECTOMY, ;  Surgeon: Johnny Bridge, MD;  Location: [place];  Service: Orthopedics;  Laterality: Right;   MALONEY DILATION N/A 08/10/2014   Procedure: [name] DILATION;  Surgeon: Daneil Dolin, MD;  [facility];  Service: Endoscopy;  Laterality: N/A;   MALONEY DILATION N/A 11/19/2016   Procedure: [name] DILATION;  Surgeon: Daneil Dolin, MD;  [facility];  Service: Endoscopy;  Laterality: N/A;   MALONEY DILATION N/A 02/08/2018   Procedure: [name] DILATION;  Surgeon: Daneil Dolin, MD;  [facility];  Service: Endoscopy;   Laterality: N/A;   MALONEY DILATION N/A 11/17/2018   Procedure: [name] DILATION;  Surgeon: Daneil Dolin, MD;  [facility];  Service: Endoscopy;  Laterality: N/A;   SAVORY DILATION N/A 08/10/2014   Procedure: SAVORY DILATION;  Surgeon: Daneil Dolin, MD;  [facility];  Service: Endoscopy;  Laterality: N/A;   SHOULDER OPEN ROTATOR CUFF REPAIR     BILAT   TONSILLECTOMY     TOTAL KNEE ARTHROPLASTY Right 12/11/2015   Procedure: TOTAL KNEE ARTHROPLASTY;  Surgeon: Marchia Bond, MD;  Location: [place];  Service: Orthopedics;  Laterality: Right;      Social History:      Social History   Tobacco Use   Smoking status: Former Smoker  Packs/day: 1.00    Years: 30.00    Pack years: 30.00    Types: Cigarettes    Start date: 07/07/1969    Quit date: 08/30/2011    Years since quitting: 7.7   Smokeless tobacco: Never Used  Substance Use Topics   Alcohol use: No    Alcohol/week: 0.0 standard drinks       Family History :     Family History  Problem Relation Age of Onset   Heart disease Mother    Colitis Mother    Atrial fibrillation Sister    Arrhythmia Brother    Colon cancer Neg Hx       Home Medications:   Prior to Admission medications   Medication Sig Start Date End Date Taking? Authorizing Provider  acetaminophen (TYLENOL) 500 MG tablet Take 1,000 mg by mouth every 6 (six) hours as needed for moderate pain or headache.   Yes [provider]  albuterol (PROVENTIL) (2.5 MG/3ML) 0.083% nebulizer solution Take 2.5 mg by nebulization every 6 (six) hours as needed for wheezing or shortness of breath.   Yes [provider]  albuterol (PROVENTIL,VENTOLIN) 90 MCG/ACT inhaler Inhale 2 puffs into the lungs every 6 (six) hours as needed for wheezing or shortness of breath.    Yes [provider]  azelastine (ASTELIN) 137 MCG/SPRAY nasal spray 2 sprays by Nasal route 2 (two) times daily. Use in each nostril as directed     Yes [provider]  buPROPion (WELLBUTRIN XL) 150 MG 24 hr tablet Take 150 mg by mouth daily. 10/09/15  Yes [provider]  busPIRone (BUSPAR) 15 MG tablet Take 7.5 mg by mouth 2 (two) times daily. 09/01/18  Yes [provider]  Calcium Carb-Cholecalciferol (CALCIUM-VITAMIN D) 600-400 MG-UNIT TABS Take 1 tablet by mouth 2 (two) times daily.   Yes [provider]  cetirizine (ZYRTEC) 10 MG tablet Take 10 mg by mouth daily.   Yes [provider]  COLAZAL 750 MG capsule TAKE 3 CAPSULES TWICE A DAY Patient taking differently: Take 2,250 mg by mouth 2 (two) times daily.  07/10/18  Yes Mahala Menghini, PA-C  dicyclomine (BENTYL) 10 MG capsule TAKE 1 CAPSULE FOUR TIMES A DAY BEFORE MEALS AND AT BEDTIME Patient taking differently: Take 10 mg by mouth 4 (four) times daily.  08/04/18  Yes Carlis Stable, NP  DULoxetine (CYMBALTA) 30 MG capsule Take 120 mg by mouth daily.    Yes [provider]  ELIQUIS 5 MG TABS tablet Take 1 tablet (5 mg total) by mouth 2 (two) times daily. 02/25/19  Yes Arnoldo Lenis, MD  esomeprazole (NEXIUM) 40 MG capsule Take 1 capsule (40 mg total) by mouth 2 (two) times daily before a meal. 04/07/19  Yes Mahala Menghini, PA-C  famotidine-calcium carbonate-magnesium hydroxide (PEPCID COMPLETE) 10-800-165 MG CHEW chewable tablet Chew 1 tablet by mouth daily as needed (for breakthrough GERD).    Yes [provider]  ferrous sulfate 325 (65 FE) MG tablet Take 325 mg by mouth daily with breakfast.   Yes [provider]  fluticasone (FLONASE) 50 MCG/ACT nasal spray Place 2 sprays into both nostrils daily. 04/07/19  Yes Kozlow, Donnamarie Poag, MD  Fluticasone-Salmeterol (ADVAIR DISKUS) 500-50 MCG/DOSE AEPB Inhale one puff twice daily 04/07/19  Yes Kozlow, Donnamarie Poag, MD  ketotifen (ALAWAY) 0.025 % ophthalmic solution Place 1 drop into both eyes daily.    Yes [provider]  lidocaine (LIDODERM) 5 % Place 1-2 patches onto the  skin daily as needed (pain). Remove & Discard patch within 12 hours or as directed by MD   Yes [provider]  methylcellulose (CITRUCEL) oral powder Take 1 packet by mouth daily as needed (constipation).    Yes [provider]  metoprolol tartrate (LOPRESSOR) 25 MG tablet TAKE ONE-HALF (1/2) TABLET TWICE A DAY 02/08/19  Yes Arnoldo Lenis, MD  pravastatin (PRAVACHOL) 20 MG tablet TAKE 1 TABLET DAILY 04/05/19  Yes Branch, Alphonse Guild, MD  pregabalin (LYRICA) 100 MG capsule Take 100 mg by mouth 2 (two) times daily.   Yes [provider]  Probiotic CAPS Take 1 capsule by mouth daily.   Yes [provider]  traZODone (DESYREL) 100 MG tablet Take 100 mg by mouth at bedtime.     Yes [provider]  FREESTYLE LITE test strip  01/17/19   [provider]     Allergies:     Allergies  Allergen Reactions   Mesalamine Other (See Comments)    Pancreatitis    Cefprozil Other (See Comments)    Aggravates Ulcerative colitis    Cefuroxime Axetil Other (See Comments)    Aggravates Ulcerative colitis    Morphine And Related Hives and Itching   Penicillins Hives    Has patient had a PCN reaction causing immediate rash, facial/tongue/throat swelling, SOB or lightheadedness with hypotension: No Has patient had a PCN reaction causing severe rash involving mucus membranes or skin necrosis: No Has patient had a PCN reaction that required hospitalization No Has patient had a PCN reaction occurring within the last 10 years: yes If all of the above answers are "NO", then may proceed with Cephalosporin use.    Aspirin Other (See Comments)    Affects the central nervous system in high doses . "shakey"   Chicken Allergy Nausea And Vomiting and Rash   Clarithromycin Nausea And Vomiting   Codeine Other (See Comments)    Hallucinations/bad dreams.   Entex Other (See Comments)    insomnia     Physical Exam:   Vitals  Blood pressure 121/82,  pulse (!) 116, temperature 98.8 F (37.1 C), resp. rate 20, height 5' 4"  (1.626 m), weight 90.7 kg, SpO2 96 %.  1.  General: Appears in no acute distress  2. Psychiatric: Alert, oriented x3, intact insight and judgment  3. Neurologic: Cranial nerves II through XII grossly intact, motor strength 5/5 in all extremities  4. HEENMT:  Atraumatic normocephalic, extraocular muscles are intact  5. Respiratory : Clear to auscultation bilaterally  6. Cardiovascular : S1-S2, regular, no murmur auscultated, no edema in the lower extremities  7. Gastrointestinal:  Abdomen is soft, mildly distended, mild diffuse tenderness to palpation, no rigidity or guarding, no organomegaly  8. Skin:  No rashes noted      Data Review:    CBC Recent Labs  Lab 05/20/19 1604  WBC 13.4*  HGB 12.8  HCT 39.8  PLT 305  MCV 93.9  MCH 30.2  MCHC 32.2  RDW 14.8  LYMPHSABS 2.1  MONOABS 1.2*  EOSABS 0.1  BASOSABS 0.1   ------------------------------------------------------------------------------------------------------------------  Results for orders placed or performed during the hospital encounter of 05/20/19 (from the past 48 hour(s))  Lipase, blood     Status: None   Collection Time: 05/20/19  4:04 PM  Result Value Ref Range   Lipase 16 11 - 51 U/L    Comment: Performed at [facility], [address], [place], [place] [address]  Comprehensive metabolic panel  Status: Abnormal   Collection Time: 05/20/19  4:04 PM  Result Value Ref Range   Sodium 134 (L) 135 - 145 mmol/L   Potassium 3.6 3.5 - 5.1 mmol/L   Chloride 101 98 - 111 mmol/L   CO2 25 22 - 32 mmol/L   Glucose, Bld 125 (H) 70 - 99 mg/dL   BUN 7 (L) 8 - 23 mg/dL   Creatinine, Ser 0.80 0.44 - 1.00 mg/dL   Calcium 8.8 (L) 8.9 - 10.3 mg/dL   Total Protein 7.2 6.5 - 8.1 g/dL   Albumin 3.5 3.5 - 5.0 g/dL   AST 13 (L) 15 - 41 U/L   ALT 13 0 - 44 U/L   Alkaline Phosphatase 88 38 - 126 U/L   Total Bilirubin 0.7 0.3 - 1.2 mg/dL    GFR calc non Af Amer >60 >60 mL/min   GFR calc Af Amer >60 >60 mL/min   Anion gap 8 5 - 15    Comment: Performed at [facility], [address], [place], [place] [address]  CBC     Status: Abnormal   Collection Time: 05/20/19  4:04 PM  Result Value Ref Range   WBC 13.4 (H) 4.0 - 10.5 K/uL   RBC 4.24 3.87 - 5.11 MIL/uL   Hemoglobin 12.8 12.0 - 15.0 g/dL   HCT 39.8 36.0 - 46.0 %   MCV 93.9 80.0 - 100.0 fL   MCH 30.2 26.0 - 34.0 pg   MCHC 32.2 30.0 - 36.0 g/dL   RDW 14.8 11.5 - 15.5 %   Platelets 305 150 - 400 K/uL   nRBC 0.0 0.0 - 0.2 %    Comment: Performed at [facility], [address] [place], [place] [address]  Differential     Status: Abnormal   Collection Time: 05/20/19  4:04 PM  Result Value Ref Range   Neutrophils Relative % 74 %   Neutro Abs 9.5 (H) 1.7 - 7.7 K/uL   Lymphocytes Relative 16 %   Lymphs Abs 2.1 0.7 - 4.0 K/uL   Monocytes Relative 9 %   Monocytes Absolute 1.2 (H) 0.1 - 1.0 K/uL   Eosinophils Relative 1 %   Eosinophils Absolute 0.1 0.0 - 0.5 K/uL   Basophils Relative 0 %   Basophils Absolute 0.1 0.0 - 0.1 K/uL   Immature Granulocytes 0 %   Abs Immature Granulocytes 0.04 0.00 - 0.07 K/uL    Comment: Performed at [facility], [address] [place], [place] [address]  Urinalysis, Routine w reflex microscopic     Status: Abnormal   Collection Time: 05/20/19  9:03 PM  Result Value Ref Range   Color, Urine STRAW (A) YELLOW   APPearance CLEAR CLEAR   Specific Gravity, Urine 1.043 (H) 1.005 - 1.030   pH 6.0 5.0 - 8.0   Glucose, UA NEGATIVE NEGATIVE mg/dL   Hgb urine dipstick SMALL (A) NEGATIVE   Bilirubin Urine NEGATIVE NEGATIVE   Ketones, ur 20 (A) NEGATIVE mg/dL   Protein, ur NEGATIVE NEGATIVE mg/dL   Nitrite NEGATIVE NEGATIVE   Leukocytes,Ua NEGATIVE NEGATIVE   RBC / HPF 0-5 0 - 5 RBC/hpf   WBC, UA 0-5 0 - 5 WBC/hpf   Bacteria, UA NONE SEEN NONE SEEN   Squamous Epithelial / LPF 0-5 0 - 5    Comment: Performed at [facility], [address]  [place],  [address]    Chemistries  Recent Labs  Lab 05/20/19 1604  NA 134*  K 3.6  CL 101  CO2 25  GLUCOSE 125*  BUN 7*  CREATININE 0.80  CALCIUM 8.8*  AST 13*  ALT 13  ALKPHOS 88  BILITOT 0.7   ------------------------------------------------------------------------------------------------------------------  ------------------------------------------------------------------------------------------------------------------ GFR: Estimated Creatinine Clearance: 72.4 mL/min (by C-G formula based on SCr of 0.8 mg/dL). Liver Function Tests: Recent Labs  Lab 05/20/19 1604  AST 13*  ALT 13  ALKPHOS 88  BILITOT 0.7  PROT 7.2  ALBUMIN 3.5   Recent Labs  Lab 05/20/19 1604  LIPASE 16    --------------------------------------------------------------------------------------------------------------- Urine analysis:    Component Value Date/Time   COLORURINE STRAW (A) 05/20/2019 2103   APPEARANCEUR CLEAR 05/20/2019 2103   LABSPEC 1.043 (H) 05/20/2019 2103   PHURINE 6.0 05/20/2019 2103   GLUCOSEU NEGATIVE 05/20/2019 2103   HGBUR SMALL (A) 05/20/2019 2103   BILIRUBINUR NEGATIVE 05/20/2019 2103   KETONESUR 20 (A) 05/20/2019 2103   PROTEINUR NEGATIVE 05/20/2019 2103   NITRITE NEGATIVE 05/20/2019 2103   LEUKOCYTESUR NEGATIVE 05/20/2019 2103      Imaging Results:    Dg Chest 2 View  Result Date: 05/20/2019 CLINICAL DATA:  Abdominal cramping with nausea fever. EXAM: CHEST - 2 VIEW COMPARISON:  02/26/2018 FINDINGS: Cardiopericardial silhouette is at upper limits of normal for size. Hiatal hernia noted. The lungs are clear without focal pneumonia, edema, pneumothorax or pleural effusion. Interstitial markings are diffusely coarsened with chronic features. The visualized bony structures of the thorax are intact. IMPRESSION: 1. No acute cardiopulmonary findings 2. Hiatal hernia. Electronically Signed   By: Misty Stanley M.D.   On: 05/20/2019 19:10   Ct Abdomen Pelvis W  Contrast  Result Date: 05/20/2019 CLINICAL DATA:  Abdominal pain with diarrhea. EXAM: CT ABDOMEN AND PELVIS WITH CONTRAST TECHNIQUE: Multidetector CT imaging of the abdomen and pelvis was performed using the standard protocol following bolus administration of intravenous contrast. CONTRAST:  159m OMNIPAQUE IOHEXOL 300 MG/ML  SOLN COMPARISON:  05/01/2016 FINDINGS: Lower chest:  Large hiatal hernia. Hepatobiliary: No suspicious focal abnormality within the liver parenchyma. Gallbladder is surgically absent. No intrahepatic or extrahepatic biliary dilation. Pancreas: No focal mass lesion. No dilatation of the main duct. No intraparenchymal cyst. No peripancreatic edema. Spleen: No splenomegaly. No focal mass lesion. Adrenals/Urinary Tract: No adrenal nodule or mass. 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 Right kidney unremarkable. The urinary bladder appears normal for the degree of distention. Stomach/Bowel: Large hiatal hernia with approximately 75-80% of the stomach in the chest. Duodenum is normally positioned as is the ligament of Treitz. No small bowel wall thickening. No small bowel dilatation. Cecum is positioned in the anterior midline with unremarkable terminal ileum The appendix is not visualized, but there is no edema or inflammation in the region of the cecum. There is diffuse mild colonic wall thickening, most prominent in the sigmoid segment but extending from the ascending colon to the level of the rectum. This is associated with pericolonic edema/inflammation. Diverticular changes are noted in the left colon. Vascular/Lymphatic: There is abdominal aortic atherosclerosis without aneurysm. Celiac axis, SMA, and IMA are opacified. Portal vein and superior mesenteric vein are patent. There is no gastrohepatic or hepatoduodenal ligament lymphadenopathy. No intraperitoneal or retroperitoneal lymphadenopathy. No pelvic sidewall lymphadenopathy.  Reproductive: The uterus is unremarkable.  There is no adnexal mass. Other: No intraperitoneal free fluid. Musculoskeletal: No worrisome lytic or sclerotic osseous abnormality. IMPRESSION: 1. Diffuse mild colonic wall thickening with pericolonic edema/inflammation. Imaging features are compatible with an infectious/inflammatory colitis. 2. Large hiatal hernia. 3. 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 4. Abdominal aortic atherosclerosis. Aortic Atherosclerosis (ICD10-I70.0). Electronically Signed   By: Misty Stanley M.D.   On: 05/20/2019 19:06    My personal review of EKG: Rhythm NSR, nonspecific T wave normality.   Assessment & Plan:    Active Problems:   Colitis   1. Colitis-patient does have history of ulcerative colitis, and was diagnosed with pancolitis recently in June.  She has been started on Cipro and Flagyl in the ED.  We will continue with empiric antibiotics.  Will obtain GI pathogen panel, C. difficile PCR.  Continue IV normal saline.  Keep her n.p.o.  Will consult gastroenterology in a.m. for further recommendations.  2. Ulcerative colitis-patient is on Colazal 2.25 g twice daily.  We will continue with this medication.  3. Atrial fibrillation-heart rate is controlled, continue Eliquis for anticoagulation, metoprolol 12.5 mg p.o. twice daily  4. COPD-continue PRN albuterol, Advair  5. Depression-continue psychotropic medications.    DVT Prophylaxis-   Eliquis  AM Labs Ordered, also please review Full Orders  Family Communication: Admission, patients condition and plan of care including tests being ordered have been discussed with the patient  who indicate understanding and agree with the plan and Code Status.  Code Status: Full code  Admission status: Inpatient: Based on patients clinical presentation and evaluation of above clinical data, I have made determination that patient meets Inpatient criteria at this  time.  Time spent in minutes : 60 minutes   Oswald Hillock M.D on 05/21/2019 at 1:42 AM

## 2019-05-21 NOTE — Consult Note (Signed)
Referring Provider: No ref. provider found Primary Care Physician:  Jalene Mullet, PA-C Primary Gastroenterologist:  DR. Gala Romney  Reason for Consultation:  COLITIS   Impression: ADMITTED WITH ABDOMINAL PAIN/DIARRHEA. CT SHOWS COLITIS. HAS ACUTE ON CHRONIC DIARRHEA-DIFFERENTIAL DIAGNOSIS INCLUDES: UC FLARE OR INFECTIOUS COLITIS(C DIFF, FOOD BORNE PATHOGEN) WITH  WITH BILE SALT INDUCED DIARRHEA,  LESS LIKELY CELIAC SPRUE, THYROID DISTURBANCE, GIARDIASIS.  Plan: 1. AWAIT STOOL STUDIES. IF NEGATIVE WILL START PREDNISONE TAPER. 2. D/C BENTYL AND IRON. 3. CONTINUE CIPRO/FLAGYL. 4. ADVANCE TO LACTOSE FREE/LOW FAT DIET. CHEW ONE TUMS WITH MEALS. 5. NO INDICATION FOR ENDOSCOPY AT THIS TIME.      HPI:   Has PAN-UC. feels like symptoms never fully controlled since 2007. Last time she remembers symptoms being under pretty good control: 2018. Symptoms out f control: off and on since 2017. Out of control symptoms: no matter what she eats goes straight through her. Last TCS 2018: NO ACTIVE DISEASE.  ATE MASHED POTATOES AND HAMBURGER FROM [employer] HOUSE. NO SEAFOOD. DOESN'T EAT CHICKEN.   THIS TIME SYMPTOMS OUT OF CONTROL SINCE   . SYMPTOMS WERE BEING CONTROLLED WITH RESTORA, COLAZAL, AND BENTYL. NO RECENT ABX: AT LEAST SEVERAL, NO TRAVEL. WELL WATER: YES. NAUSEA: FOR PAST WEEK OR TWO. BMs: FIRST MEAL EVERY DAY(WATERY-3-4X AND CAN BE 5-8 TIMES). STARTS UPPER LEFT AND THEN MOVES TO RIIGHT LOWER(BUBBLE THEN CRAMPING. HEARTBURN SOME. MAX TEMP: 100.56F AT HOME. SINCE YESTERDAY FEELS SAME.  PT DENIES FEVER, CHILLS, EYE PAIN OR JOINT PAIN, HEMATOCHEZIA, HEMATEMESIS, vomiting, melena, CHEST PAIN, SHORTNESS OF BREATH,  CHANGE IN BOWEL IN HABITS, constipation, problems swallowing, problems with sedation, OR heartburn or indigestion.   Past Medical History:  Diagnosis Date  . Anemia   . Anginal pain ([place])    PATIENT STATES DOCT THINKS IS FIBROMYALGIA  . Arthritis   . Asthma   . Atrial fibrillation ([place])   .  Blood clot in lung last march  . Cancer ([place])    1980  CERVICAL  . Chronic back pain   . COPD (chronic obstructive pulmonary disease) ([place])   . DDD (degenerative disc disease)   . Degenerative tear of posterior horn of lateral meniscus of right knee 01/27/2014  . Depression   . Diabetes mellitus without complication ([place])   . Diverticulitis   . Fibromyalgia   . GERD (gastroesophageal reflux disease)   . Headache    HX MIGRAINES    . Hiatal hernia   . HOH (hard of hearing)    right  . Pancreatitis   . Pneumonia    1 YR  . PONV (postoperative nausea and vomiting)    only happened after knee arthroscopy  . Primary localized osteoarthritis of right knee 12/11/2015  . PVC's (premature ventricular contractions)   . Schatzki's ring   . Seizures ([place])    had febrile seizures as child; none since childhood and on no meds  . Shortness of breath dyspnea    WITH EXERTION   . Sleep apnea    CPAP at night  . Ulcerative colitis 2007  . Wears dentures    full top-partial bottom    Past Surgical History:  Procedure Laterality Date  . arch and foot    . BALLOON DILATION  11/17/2018   Procedure: BALLOON DILATION;  Surgeon: Daneil Dolin, MD;  [facility];  Service: Endoscopy;;  esophagus  . CERVICAL CONE BIOPSY    . CHOLECYSTECTOMY    . COLONOSCOPY  12/03/05   Rourk-marked inflammatory changes of  the rectum, left colon, pan colitis, internal hemorrhoids, sigmoid diverticula  . COLONOSCOPY N/A 10/20/2013   Dr. Rourk:Colonic diverticulosis. colonic mucosal ulcerations involving segment of sigmoid colon, atypical for UC. sigmoid colon path with chronic mildly active colitis consistent with IBD, other biopsies including ascending, transverse, descending colon and rectum unremarkable.   . COLONOSCOPY N/A 11/19/2016   Dr. Gala Romney: Diverticulosis, 7 millimeter polyp removed from the descending colon, internal hemorrhoids, segmental biopsies unremarkable.  Marland Kitchen DILATION AND CURETTAGE OF UTERUS     . ESOPHAGEAL DILATION N/A 09/20/2015   Procedure: ESOPHAGEAL DILATION;  Surgeon: Daneil Dolin, MD;  [facility];  Service: Endoscopy;  Laterality: N/A;  . ESOPHAGOGASTRODUODENOSCOPY  01/28/2011   Rourk- Schatzki ring, s/p 54F, otherwise normal/Hiatal hernia otherwise normal stomach D1 and D2  . ESOPHAGOGASTRODUODENOSCOPY  11/30/08   Rourk-Schatzki's ring status post dilation, hiatal hernia  . ESOPHAGOGASTRODUODENOSCOPY  07/12/2007   Dilation with 56 French, Schatzki's ring  . ESOPHAGOGASTRODUODENOSCOPY N/A 08/10/2014   Dr.Rourk- prominent schatzki's ring s/p maloney dilation and disruption, moderate sized hiatal hernia  . ESOPHAGOGASTRODUODENOSCOPY N/A 09/20/2015   RMR: Schatzki's ring status post dilation as described above. Rather large hiatal hernia; somewhat baggy atonic esophageal body' otherwise normal EGD  . ESOPHAGOGASTRODUODENOSCOPY N/A 11/19/2016   Dr. Gala Romney: moderate Schatzki ring s/p dirsuption with biopsy forceps, large hiatal hernia  . ESOPHAGOGASTRODUODENOSCOPY N/A 11/17/2018   Procedure: ESOPHAGOGASTRODUODENOSCOPY (EGD);  Surgeon: Daneil Dolin, MD;  [facility];  Service: Endoscopy;  Laterality: N/A;  12:00PM  . ESOPHAGOGASTRODUODENOSCOPY (EGD) WITH ESOPHAGEAL DILATION  09/01/2012   Dr. Gala Romney- schatzki's ring, hiatal hernia  . ESOPHAGOGASTRODUODENOSCOPY (EGD) WITH PROPOFOL N/A 02/08/2018   Procedure: ESOPHAGOGASTRODUODENOSCOPY (EGD) WITH PROPOFOL;  Surgeon: Daneil Dolin, MD;  [facility];  Service: Endoscopy;  Laterality: N/A;  10:30am  . FOOT ARTHRODESIS  [id number]   both feet  . HERNIA REPAIR     umbilical  . JOINT REPLACEMENT Right   . KNEE ARTHROSCOPY WITH LATERAL MENISECTOMY Right 01/27/2014   Procedure: RIGHT KNEE ARTHROSCOPY WITH PARTIAL LATERAL MENISECTOMY, ;  Surgeon: Johnny Bridge, MD;  Location: [place];  Service: Orthopedics;  Laterality: Right;  . MALONEY DILATION N/A 08/10/2014   Procedure: [name]  DILATION;  Surgeon: Daneil Dolin, MD;  [facility];  Service: Endoscopy;  Laterality: N/A;  [name] DILATION N/A 11/19/2016   Procedure: [name] DILATION;  Surgeon: Daneil Dolin, MD;  [facility];  Service: Endoscopy;  Laterality: N/A;  . [name] DILATION N/A 02/08/2018   Procedure: [name] DILATION;  Surgeon: Daneil Dolin, MD;  [facility];  Service: Endoscopy;  Laterality: N/A;  [name] DILATION N/A 11/17/2018   Procedure: [name] DILATION;  Surgeon: Daneil Dolin, MD;  [facility];  Service: Endoscopy;  Laterality: N/A;  . SAVORY DILATION N/A 08/10/2014   Procedure: SAVORY DILATION;  Surgeon: Daneil Dolin, MD;  [facility];  Service: Endoscopy;  Laterality: N/A;  . SHOULDER OPEN ROTATOR CUFF REPAIR     BILAT  . TONSILLECTOMY    . TOTAL KNEE ARTHROPLASTY Right 12/11/2015   Procedure: TOTAL KNEE ARTHROPLASTY;  Surgeon: Marchia Bond, MD;  Location: [place];  Service: Orthopedics;  Laterality: Right;    Prior to Admission medications   Medication Sig Start Date End Date Taking? Authorizing Provider  acetaminophen (TYLENOL) 500 MG tablet Take 1,000 mg by mouth every 6 (six) hours as needed for moderate pain or headache.  Yes [provider]  albuterol (PROVENTIL) (2.5 MG/3ML) 0.083% nebulizer solution Take 2.5 mg by nebulization every 6 (six) hours as needed for wheezing or shortness of breath.   Yes [provider]  albuterol (PROVENTIL,VENTOLIN) 90 MCG/ACT inhaler Inhale 2 puffs into the lungs every 6 (six) hours as needed for wheezing or shortness of breath.    Yes [provider]  azelastine (ASTELIN) 137 MCG/SPRAY nasal spray 2 sprays by Nasal route 2 (two) times daily. Use in each nostril as directed    Yes [provider]  buPROPion (WELLBUTRIN XL) 150 MG 24 hr tablet Take 150 mg by mouth daily. 10/09/15  Yes [provider]  busPIRone (BUSPAR) 15 MG tablet Take 7.5 mg by  mouth 2 (two) times daily. 09/01/18  Yes [provider]  Calcium Carb-Cholecalciferol (CALCIUM-VITAMIN D) 600-400 MG-UNIT TABS Take 1 tablet by mouth 2 (two) times daily.   Yes [provider]  cetirizine (ZYRTEC) 10 MG tablet Take 10 mg by mouth daily.   Yes [provider]  COLAZAL 750 MG capsule TAKE 3 CAPSULES TWICE A DAY Patient taking differently: Take 2,250 mg by mouth 2 (two) times daily.  07/10/18  Yes Mahala Menghini, PA-C  dicyclomine (BENTYL) 10 MG capsule TAKE 1 CAPSULE FOUR TIMES A DAY BEFORE MEALS AND AT BEDTIME Patient taking differently: Take 10 mg by mouth 4 (four) times daily.  08/04/18  Yes Carlis Stable, NP  DULoxetine (CYMBALTA) 30 MG capsule Take 120 mg by mouth daily.    Yes [provider]  ELIQUIS 5 MG TABS tablet Take 1 tablet (5 mg total) by mouth 2 (two) times daily. 02/25/19  Yes Arnoldo Lenis, MD  esomeprazole (NEXIUM) 40 MG capsule Take 1 capsule (40 mg total) by mouth 2 (two) times daily before a meal. 04/07/19  Yes Mahala Menghini, PA-C  famotidine-calcium carbonate-magnesium hydroxide (PEPCID COMPLETE) 10-800-165 MG CHEW chewable tablet Chew 1 tablet by mouth daily as needed (for breakthrough GERD).    Yes [provider]  ferrous sulfate 325 (65 FE) MG tablet Take 325 mg by mouth daily with breakfast.   Yes [provider]  fluticasone (FLONASE) 50 MCG/ACT nasal spray Place 2 sprays into both nostrils daily. 04/07/19  Yes Kozlow, Donnamarie Poag, MD  Fluticasone-Salmeterol (ADVAIR DISKUS) 500-50 MCG/DOSE AEPB Inhale one puff twice daily 04/07/19  Yes Kozlow, Donnamarie Poag, MD  ketotifen (ALAWAY) 0.025 % ophthalmic solution Place 1 drop into both eyes daily.    Yes [provider]  lidocaine (LIDODERM) 5 % Place 1-2 patches onto the skin daily as needed (pain). Remove & Discard patch within 12 hours or as directed by MD   Yes [provider]  methylcellulose (CITRUCEL) oral powder Take 1 packet by mouth daily as  needed (constipation).    Yes [provider]  metoprolol tartrate (LOPRESSOR) 25 MG tablet TAKE ONE-HALF (1/2) TABLET TWICE A DAY 02/08/19  Yes Arnoldo Lenis, MD  pravastatin (PRAVACHOL) 20 MG tablet TAKE 1 TABLET DAILY 04/05/19  Yes Branch, Alphonse Guild, MD  pregabalin (LYRICA) 100 MG capsule Take 100 mg by mouth 2 (two) times daily.   Yes [provider]  Probiotic CAPS Take 1 capsule by mouth daily.   Yes [provider]  traZODone (DESYREL) 100 MG tablet Take 100 mg by mouth at bedtime.     Yes [provider]  FREESTYLE LITE test strip  01/17/19   [provider]    Current Facility-Administered Medications  Medication Dose Route Frequency Provider Last Rate Last Dose  . 0.9 %  sodium chloride infusion   Intravenous Continuous Oswald Hillock, MD 100 mL/hr at 05/21/19 0255    . acetaminophen (TYLENOL) tablet 1,000 mg  1,000 mg Oral Q6H PRN Oswald Hillock, MD   1,000 mg at 05/21/19 0254  . albuterol (PROVENTIL) (2.5 MG/3ML) 0.083% nebulizer solution 2.5 mg  2.5 mg Nebulization Q6H PRN Oswald Hillock, MD      . apixaban (ELIQUIS) tablet 5 mg  5 mg Oral BID Oswald Hillock, MD      . balsalazide (COLAZAL) capsule 2,250 mg  2,250 mg Oral BID Barton Dubois, MD      . buPROPion (WELLBUTRIN XL) 24 hr tablet 150 mg  150 mg Oral Daily Darrick Meigs, Marge Duncans, MD      . busPIRone (BUSPAR) tablet 7.5 mg  7.5 mg Oral BID Oswald Hillock, MD      . ciprofloxacin (CIPRO) IVPB 400 mg  400 mg Intravenous Q12H Darrick Meigs, Marge Duncans, MD      . dicyclomine (BENTYL) capsule 10 mg  10 mg Oral QID Oswald Hillock, MD      . DULoxetine (CYMBALTA) DR capsule 120 mg  120 mg Oral Daily Darrick Meigs, Marge Duncans, MD      . ferrous sulfate tablet 325 mg  325 mg Oral Q breakfast [place], Marge Duncans, MD      . ketotifen (ZADITOR) 0.025 % ophthalmic solution 1 drop  1 drop Both Eyes Daily Darrick Meigs, Marge Duncans, MD      . loratadine (CLARITIN) tablet 10 mg  10 mg Oral Daily Oswald Hillock, MD      . metoprolol tartrate  (LOPRESSOR) tablet 12.5 mg  12.5 mg Oral BID Oswald Hillock, MD      . metroNIDAZOLE (FLAGYL) IVPB 500 mg  500 mg Intravenous Q8H Oswald Hillock, MD 100 mL/hr at 05/21/19 0500 500 mg at 05/21/19 0500  . mometasone-formoterol (DULERA) 200-5 MCG/ACT inhaler 2 puff  2 puff Inhalation BID Oswald Hillock, MD      . ondansetron [facility]) tablet 4 mg  4 mg Oral Q6H PRN Oswald Hillock, MD       Or  . ondansetron (ZOFRAN) injection 4 mg  4 mg Intravenous Q6H PRN Darrick Meigs, Marge Duncans, MD      . pantoprazole (PROTONIX) EC tablet 40 mg  40 mg Oral Daily Darrick Meigs, Gagan S, MD      . pravastatin (PRAVACHOL) tablet 20 mg  20 mg Oral Daily Darrick Meigs, Marge Duncans, MD      . pregabalin (LYRICA) capsule 100 mg  100 mg Oral BID Oswald Hillock, MD      . traZODone (DESYREL) tablet 100 mg  100 mg Oral QHS Oswald Hillock, MD        Allergies as of 05/20/2019 - Review Complete 05/20/2019  Allergen Reaction Noted  . Mesalamine Other (See Comments)   . Cefprozil Other (See Comments)   . Cefuroxime axetil Other (See Comments) 08/08/2008  . Morphine and related Hives and Itching 08/23/2012  . Penicillins Hives 08/08/2008  . Aspirin Other (See Comments)   . Chicken allergy Nausea And Vomiting and Rash 11/27/2015  . Clarithromycin Nausea And Vomiting 08/08/2008  . Codeine Other (See Comments)   . Entex Other (See Comments) 08/08/2008    Family History  Problem Relation Age of Onset  . Heart disease Mother   . Colitis Mother   . Atrial fibrillation  Sister   . Arrhythmia Brother   . Colon cancer Neg Hx      Social History   Socioeconomic History  . Marital status: Married    Spouse name: Not on file  . Number of children: 3  . Years of education: Not on file  . Highest education level: Not on file  Occupational History  . 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 Financial resource strain: Not on file  . Food insecurity    Worry: Not on file    Inability: Not on file  . Transportation needs    Medical: Not  on file    Non-medical: Not on file  Tobacco Use  . Smoking status: Former Smoker    Packs/day: 1.00    Years: 30.00    Pack years: 30.00    Types: Cigarettes    Start date: 07/07/1969    Quit date: 08/30/2011    Years since quitting: 7.7  . Smokeless tobacco: Never Used  Substance and Sexual Activity  . Alcohol use: No    Alcohol/week: 0.0 standard drinks  . Drug use: No  . Sexual activity: Yes    Partners: Male    Birth control/protection: None    Comment: spouse  Lifestyle  . Physical activity    Days per week: Not on file    Minutes per session: Not on file  . Stress: Not on file  Relationships  . Social Herbalist on phone: Not on file    Gets together: Not on file    Attends religious service: Not on file    Active member of club or organization: Not on file    Attends meetings of clubs or organizations: Not on file    Relationship status: Not on file  . Intimate partner violence    Fear of current or ex partner: Not on file    Emotionally abused: Not on file    Physically abused: Not on file    Forced sexual activity: Not on file  Other Topics Concern  . Not on file  Social History Narrative  . Not on file    Review of Systems: PER HPI OTHERWISE ALL SYSTEMS ARE NEGATIVE.   Vitals: Blood pressure (!) 147/83, pulse (!) 112, temperature 99.7 F (37.6 C), temperature source Oral, resp. rate 18, height 5' 4"  (1.626 m), weight 90.7 kg, SpO2 96 %.  Physical Exam: General:   Alert,  Well-developed, well-nourished, pleasant and cooperative in NAD Head:  Normocephalic and atraumatic. Eyes:  Sclera clear, no icterus.   Conjunctiva pink. Mouth:  No lesions, dentition ABnormal. Neck:  Supple; no masses. Lungs:  Clear throughout to auscultation.   No wheezes. No acute distress. Heart:  Regular rate and rhythm; no murmurs. Abdomen:  Soft, nontender and nondistended. No masses,noted. Normal bowel sounds, without guarding, and without rebound.   Msk:   Symmetrical without gross deformities. Extremities:  Without edema. Neurologic:  Alert and  oriented x4;  NO  NEW FOCAL DEFICITS. Cervical Nodes:  No significant cervical adenopathy. Psych:  Alert and cooperative. ANXIOUS mood and affect.   Lab Results: Recent Labs    05/20/19 1604 05/21/19 0608  WBC 13.4* 10.9*  HGB 12.8 11.8*  HCT 39.8 37.6  PLT 305 233   BMET Recent Labs    05/20/19 1604 05/21/19 0608  NA 134* 137  K 3.6 3.4*  CL 101 105  CO2 25 24  GLUCOSE 125* 125*  BUN 7* 6*  CREATININE 0.80 0.79  CALCIUM 8.8* 8.4*   LFT Recent Labs    05/21/19 0608  PROT 6.6  ALBUMIN 3.2*  AST 14*  ALT 12  ALKPHOS 79  BILITOT 0.6     Studies/Results: CT SCAN AUG 21: LARGE HIATAL HERNIA/PANCOLITIS(MILD)   LOS: 0 days   Kyvon Hu  05/21/2019, 8:59 AM

## 2019-05-22 DIAGNOSIS — I482 Chronic atrial fibrillation, unspecified: Secondary | ICD-10-CM

## 2019-05-22 DIAGNOSIS — K51919 Ulcerative colitis, unspecified with unspecified complications: Secondary | ICD-10-CM

## 2019-05-22 DIAGNOSIS — F329 Major depressive disorder, single episode, unspecified: Secondary | ICD-10-CM

## 2019-05-22 LAB — CBC
HCT: 35.2 % — ABNORMAL LOW (ref 36.0–46.0)
Hemoglobin: 11.1 g/dL — ABNORMAL LOW (ref 12.0–15.0)
MCH: 29.8 pg (ref 26.0–34.0)
MCHC: 31.5 g/dL (ref 30.0–36.0)
MCV: 94.4 fL (ref 80.0–100.0)
Platelets: 292 10*3/uL (ref 150–400)
RBC: 3.73 MIL/uL — ABNORMAL LOW (ref 3.87–5.11)
RDW: 14.7 % (ref 11.5–15.5)
WBC: 8.7 10*3/uL (ref 4.0–10.5)
nRBC: 0 % (ref 0.0–0.2)

## 2019-05-22 LAB — URINE CULTURE

## 2019-05-22 LAB — BASIC METABOLIC PANEL
Anion gap: 10 (ref 5–15)
BUN: <5 mg/dL — ABNORMAL LOW (ref 8–23)
CO2: 20 mmol/L — ABNORMAL LOW (ref 22–32)
Calcium: 8.1 mg/dL — ABNORMAL LOW (ref 8.9–10.3)
Chloride: 108 mmol/L (ref 98–111)
Creatinine, Ser: 0.75 mg/dL (ref 0.44–1.00)
GFR calc Af Amer: >60 mL/min (ref >60)
GFR calc non Af Amer: >60 mL/min (ref >60)
Glucose, Bld: 126 mg/dL — ABNORMAL HIGH (ref 70–99)
Potassium: 2.8 mmol/L — ABNORMAL LOW (ref 3.5–5.1)
Sodium: 138 mmol/L (ref 135–145)

## 2019-05-22 LAB — C DIFFICILE QUICK SCREEN W PCR REFLEX
C Diff antigen: POSITIVE — AB
C Diff interpretation: DETECTED
C Diff toxin: POSITIVE — AB

## 2019-05-22 LAB — HIV ANTIBODY (ROUTINE TESTING W REFLEX): HIV Screen 4th Generation wRfx: NONREACTIVE

## 2019-05-22 MED ORDER — VANCOMYCIN 50 MG/ML ORAL SOLUTION
125.0000 mg | Freq: Four times a day (QID) | ORAL | Status: DC
  Administered 2019-05-22 – 2019-05-28 (×25): 125 mg via ORAL
  Filled 2019-05-22 (×33): qty 2.5

## 2019-05-22 NOTE — Progress Notes (Signed)
PROGRESS NOTE    [name]  YQM:[id number] DOB: [birth year] DOA: 05/20/2019 PCP: Jalene Mullet, PA-C     Brief Narrative:  68 y.o. female, with history of ulcerative colitis recent diagnosis of ulcerative pancolitis in June 2020, followed by GI as outpatient, atrial fibrillation on anticoagulation with Eliquis, COPD came to hospital with complaints of abdominal pain and diarrhea.  Patient says that she had multiple loose bowel movements.  Also generalized abdominal pain.  CT scan of the abdomen pelvis done in the ED shows diffuse pancolitis infectious versus inflammatory.  She was recently seen by gastroenterology as outpatient. Denies vomiting but complains of nausea Denies chest pain or shortness of breath. Complains of fever, but has been afebrile in the hospital. Lab work showed WBC 13.4 Patient empirically started on Cipro and Flagyl.  C. difficile PCR and GI pathogen panel ordered.   Assessment & Plan: 1-C. difficile pancolitis -C. difficile antigen and toxin positive -GI panel pending -IV ciprofloxacin and Flagyl has been discontinued; patient started on vancomycin 125 mg every 6 hours. -Continue supportive care.  2-history of ulcerative colitis  -Continue Colazal twice a day -Steroids contraindicated in the presence of acute infection -Follow GI service recommendations and supportive care.  3-chronic atrial fibrillation -Rate controlled -Continue metoprolol and Eliquis  4-COPD -Stable and compensated -Continue PRN albuterol -Continue Dulera  5-depression/anxiety -No suicidal ideation or hallucination -Continue home psychotropic medications.  DVT prophylaxis: chronically on Eliquis.  Code Status: Full code Family Communication: No family bedside. Disposition Plan: Remains inpatient, continue supportive care; started patient on Vancomycin orally for positive C. Diff infection.  Consultants:   Gastroenterology service  Procedures:   See below for x-ray  report.  Antimicrobials:  Anti-infectives (From admission, onward)   Start     Dose/Rate Route Frequency Ordered Stop   05/22/19 1400  vancomycin (VANCOCIN) 50 mg/mL oral solution 125 mg     125 mg Oral 4 times daily 05/22/19 1233 06/01/19 1359   05/21/19 0800  ciprofloxacin (CIPRO) IVPB 400 mg  Status:  Discontinued     400 mg 200 mL/hr over 60 Minutes Intravenous Every 12 hours 05/21/19 0213 05/22/19 1233   05/21/19 0400  metroNIDAZOLE (FLAGYL) IVPB 500 mg  Status:  Discontinued     500 mg 100 mL/hr over 60 Minutes Intravenous Every 8 hours 05/21/19 0213 05/22/19 1233   05/20/19 2000  ciprofloxacin (CIPRO) tablet 500 mg  Status:  Discontinued     500 mg Oral  Once 05/20/19 1951 05/20/19 1952   05/20/19 2000  metroNIDAZOLE (FLAGYL) tablet 500 mg  Status:  Discontinued     500 mg Oral  Once 05/20/19 1951 05/20/19 1952   05/20/19 2000  ciprofloxacin (CIPRO) IVPB 400 mg     400 mg 200 mL/hr over 60 Minutes Intravenous  Once 05/20/19 1952 05/20/19 2147   05/20/19 2000  metroNIDAZOLE (FLAGYL) IVPB 500 mg     500 mg 100 mL/hr over 60 Minutes Intravenous  Once 05/20/19 1952 05/20/19 2148       Subjective: No fever, no chest pain, no shortness of breath.  Reports no nausea no vomiting.  Having intermittent episodes of cramping abdominal discomfort and is still with ongoing diarrhea.  Objective: Vitals:   05/21/19 2054 05/22/19 0621 05/22/19 0623 05/22/19 0745  BP:  (!) 125/112 127/74   Pulse:  97 95   Resp:  20    Temp:  98.9 F (37.2 C)    TempSrc:  Oral    SpO2: 96% 94%  92% 94%  Weight:      Height:        Intake/Output Summary (Last 24 hours) at 05/22/2019 1407 Last data filed at 05/22/2019 0400 Gross per 24 hour  Intake 2713.69 ml  Output --  Net 2713.69 ml   Filed Weights   05/20/19 1506  Weight: 90.7 kg    Examination: General exam: Alert, awake, oriented x 3; patient continue experiencing diarrhea, intermittent episodes of abdominal pain.  No nausea vomiting  currently.  She is afebrile. Respiratory system: Clear to auscultation. Respiratory effort normal. Cardiovascular system:RRR. No murmurs, rubs, gallops. Gastrointestinal system: Abdomen is nondistended, soft and with positive bowel sounds.  At this moment is not having any pain, but expressed intermittent cramping episodes.   Central nervous system: Alert and oriented. No focal neurological deficits. Extremities: No C/C/E, +pedal pulses Skin: No rashes, lesions or ulcers Psychiatry: Judgement and insight appear normal. Mood & affect appropriate.     Data Reviewed: I have personally reviewed following labs and imaging studies  CBC: Recent Labs  Lab 05/20/19 1604 05/21/19 0608 05/22/19 0642  WBC 13.4* 10.9* 8.7  NEUTROABS 9.5*  --   --   HGB 12.8 11.8* 11.1*  HCT 39.8 37.6 35.2*  MCV 93.9 93.8 94.4  PLT 305 233 335   Basic Metabolic Panel: Recent Labs  Lab 05/20/19 1604 05/21/19 0608 05/22/19 0642  NA 134* 137 138  K 3.6 3.4* 2.8*  CL 101 105 108  CO2 25 24 20*  GLUCOSE 125* 125* 126*  BUN 7* 6* <5*  CREATININE 0.80 0.79 0.75  CALCIUM 8.8* 8.4* 8.1*   GFR: Estimated Creatinine Clearance: 72.4 mL/min (by C-G formula based on SCr of 0.75 mg/dL).   Liver Function Tests: Recent Labs  Lab 05/20/19 1604 05/21/19 0608  AST 13* 14*  ALT 13 12  ALKPHOS 88 79  BILITOT 0.7 0.6  PROT 7.2 6.6  ALBUMIN 3.5 3.2*   Recent Labs  Lab 05/20/19 1604  LIPASE 16   Urine analysis:    Component Value Date/Time   COLORURINE STRAW (A) 05/20/2019 2103   APPEARANCEUR CLEAR 05/20/2019 2103   LABSPEC 1.043 (H) 05/20/2019 2103   PHURINE 6.0 05/20/2019 2103   GLUCOSEU NEGATIVE 05/20/2019 2103   HGBUR SMALL (A) 05/20/2019 2103   BILIRUBINUR NEGATIVE 05/20/2019 2103   KETONESUR 20 (A) 05/20/2019 2103   PROTEINUR NEGATIVE 05/20/2019 2103   NITRITE NEGATIVE 05/20/2019 2103   LEUKOCYTESUR NEGATIVE 05/20/2019 2103    Recent Results (from the past 240 hour(s))  Urine culture      Status: None   Collection Time: 05/20/19  9:03 PM   Specimen: Urine, Clean Catch  Result Value Ref Range Status   Specimen Description   Final    URINE, CLEAN CATCH Performed at [facility], [address] [place], [place] [address]    Special Requests   Final    NONE Performed at [facility], [address], [place], [place] [address]    Culture   Final    Multiple bacterial morphotypes present, none predominant. Suggest appropriate recollection if clinically indicated.   Report Status 05/22/2019 FINAL  Final  SARS CORONAVIRUS 2 Nasal Swab Aptima Multi Swab     Status: None   Collection Time: 05/20/19  9:20 PM   Specimen: Aptima Multi Swab; Nasal Swab  Result Value Ref Range Status   SARS Coronavirus 2 NEGATIVE NEGATIVE Final    Comment: (NOTE) SARS-CoV-2 target nucleic acids are NOT DETECTED. The SARS-CoV-2 RNA is generally  detectable in upper and lower respiratory specimens during the acute phase of infection. Negative results do not preclude SARS-CoV-2 infection, do not rule out co-infections with other pathogens, and should not be used as the sole basis for treatment or other patient management decisions. Negative results must be combined with clinical observations, patient history, and epidemiological information. The expected result is Negative. Fact Sheet for Patients: SugarRoll.be Fact Sheet for Healthcare Providers: https://www.woods-mathews.com/ This test is not yet approved or cleared by the [place] FDA and  has been authorized for detection and/or diagnosis of SARS-CoV-2 by FDA under an Emergency Use Authorization (EUA). This EUA will remain  in effect (meaning this test can be used) for the duration of the COVID-19 declaration under Section 56 4(b)(1) of the Act, 21 U.S.C. section 360bbb-3(b)(1), unless the authorization is terminated or revoked sooner. Performed at [facility], [place] [address]  [place], [place] [address]   SARS Coronavirus 2 [facility] order, Performed in [facility] hospital lab) Nasopharyngeal Nasopharyngeal Swab     Status: None   Collection Time: 05/21/19  8:16 AM   Specimen: Nasopharyngeal Swab  Result Value Ref Range Status   SARS Coronavirus 2 NEGATIVE NEGATIVE Final    Comment: (NOTE) If result is NEGATIVE SARS-CoV-2 target nucleic acids are NOT DETECTED. The SARS-CoV-2 RNA is generally detectable in upper and lower  respiratory specimens during the acute phase of infection. The lowest  concentration of SARS-CoV-2 viral copies this assay can detect is 250  copies / mL. A negative result does not preclude SARS-CoV-2 infection  and should not be used as the sole basis for treatment or other  patient management decisions.  A negative result may occur with  improper specimen collection / handling, submission of specimen other  than nasopharyngeal swab, presence of viral mutation(s) within the  areas targeted by this assay, and inadequate number of viral copies  (<250 copies / mL). A negative result must be combined with clinical  observations, patient history, and epidemiological information. If result is POSITIVE SARS-CoV-2 target nucleic acids are DETECTED. The SARS-CoV-2 RNA is generally detectable in upper and lower  respiratory specimens dur ing the acute phase of infection.  Positive  results are indicative of active infection with SARS-CoV-2.  Clinical  correlation with patient history and other diagnostic information is  necessary to determine patient infection status.  Positive results do  not rule out bacterial infection or co-infection with other viruses. If result is PRESUMPTIVE POSTIVE SARS-CoV-2 nucleic acids MAY BE PRESENT.   A presumptive positive result was obtained on the submitted specimen  and confirmed on repeat testing.  While 2019 novel coronavirus  (SARS-CoV-2) nucleic acids may be present in the submitted sample  additional  confirmatory testing may be necessary for epidemiological  and / or clinical management purposes  to differentiate between  SARS-CoV-2 and other Sarbecovirus currently known to infect humans.  If clinically indicated additional testing with an alternate test  methodology [phone]) is advised. The SARS-CoV-2 RNA is generally  detectable in upper and lower respiratory sp ecimens during the acute  phase of infection. The expected result is Negative. Fact Sheet for Patients:  StrictlyIdeas.no Fact Sheet for Healthcare Providers: BankingDealers.co.za This test is not yet approved or cleared by the [place] FDA and has been authorized for detection and/or diagnosis of SARS-CoV-2 by FDA under an Emergency Use Authorization (EUA).  This EUA will remain in effect (meaning this test can be used) for the duration of the COVID-19 declaration  under Section 564(b)(1) of the Act, 21 U.S.C. section 360bbb-3(b)(1), unless the authorization is terminated or revoked sooner. Performed at [facility], [address] [place], [place] [address]   C difficile quick scan w PCR reflex     Status: Abnormal   Collection Time: 05/22/19 10:30 AM   Specimen: Stool  Result Value Ref Range Status   C Diff antigen POSITIVE (A) NEGATIVE Final   C Diff toxin POSITIVE (A) NEGATIVE Final   C Diff interpretation Toxin producing C. difficile detected.  Final    Comment: CRITICAL RESULT CALLED TO, READ BACK BY AND VERIFIED WITH:  M.TAYLOR @ 6045 ON 8.23.20 BY LBB Performed at [facility], [address] [place], [place] [address]      Radiology Studies: Dg Chest 2 View  Result Date: 05/20/2019 CLINICAL DATA:  Abdominal cramping with nausea fever. EXAM: CHEST - 2 VIEW COMPARISON:  02/26/2018 FINDINGS: Cardiopericardial silhouette is at upper limits of normal for size. Hiatal hernia noted. The lungs are clear without focal pneumonia, edema, pneumothorax or pleural  effusion. Interstitial markings are diffusely coarsened with chronic features. The visualized bony structures of the thorax are intact. IMPRESSION: 1. No acute cardiopulmonary findings 2. Hiatal hernia. Electronically Signed   By: Misty Stanley M.D.   On: 05/20/2019 19:10   Ct Abdomen Pelvis W Contrast  Result Date: 05/20/2019 CLINICAL DATA:  Abdominal pain with diarrhea. EXAM: CT ABDOMEN AND PELVIS WITH CONTRAST TECHNIQUE: Multidetector CT imaging of the abdomen and pelvis was performed using the standard protocol following bolus administration of intravenous contrast. CONTRAST:  155m OMNIPAQUE IOHEXOL 300 MG/ML  SOLN COMPARISON:  05/01/2016 FINDINGS: Lower chest:  Large hiatal hernia. Hepatobiliary: No suspicious focal abnormality within the liver parenchyma. Gallbladder is surgically absent. No intrahepatic or extrahepatic biliary dilation. Pancreas: No focal mass lesion. No dilatation of the main duct. No intraparenchymal cyst. No peripancreatic edema. Spleen: No splenomegaly. No focal mass lesion. Adrenals/Urinary Tract: No adrenal nodule or mass. 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 Right kidney unremarkable. The urinary bladder appears normal for the degree of distention. Stomach/Bowel: Large hiatal hernia with approximately 75-80% of the stomach in the chest. Duodenum is normally positioned as is the ligament of Treitz. No small bowel wall thickening. No small bowel dilatation. Cecum is positioned in the anterior midline with unremarkable terminal ileum The appendix is not visualized, but there is no edema or inflammation in the region of the cecum. There is diffuse mild colonic wall thickening, most prominent in the sigmoid segment but extending from the ascending colon to the level of the rectum. This is associated with pericolonic edema/inflammation. Diverticular changes are noted in the left colon. Vascular/Lymphatic: There is abdominal  aortic atherosclerosis without aneurysm. Celiac axis, SMA, and IMA are opacified. Portal vein and superior mesenteric vein are patent. There is no gastrohepatic or hepatoduodenal ligament lymphadenopathy. No intraperitoneal or retroperitoneal lymphadenopathy. No pelvic sidewall lymphadenopathy. Reproductive: The uterus is unremarkable.  There is no adnexal mass. Other: No intraperitoneal free fluid. Musculoskeletal: No worrisome lytic or sclerotic osseous abnormality. IMPRESSION: 1. Diffuse mild colonic wall thickening with pericolonic edema/inflammation. Imaging features are compatible with an infectious/inflammatory colitis. 2. Large hiatal hernia. 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 4. Abdominal aortic atherosclerosis. Aortic Atherosclerosis (ICD10-I70.0). Electronically Signed   By: EMisty StanleyM.D.   On: 05/20/2019 19:06    Scheduled Meds:  apixaban  5 mg Oral BID  balsalazide  2,250 mg Oral BID   buPROPion  150 mg Oral Daily   busPIRone  7.5 mg Oral BID   calcium carbonate  1 tablet Oral TID WC   DULoxetine  120 mg Oral Daily   ketotifen  1 drop Both Eyes Daily   loratadine  10 mg Oral Daily   metoprolol tartrate  12.5 mg Oral BID   mometasone-formoterol  2 puff Inhalation BID   pantoprazole  40 mg Oral Daily   pravastatin  20 mg Oral Daily   pregabalin  100 mg Oral BID   traZODone  100 mg Oral QHS   vancomycin  125 mg Oral QID   Continuous Infusions:  sodium chloride 100 mL/hr at 05/21/19 0255     LOS: 1 day    Time spent: 35 minutes    Barton Dubois, MD [facility] Pager [phone]   05/22/2019, 2:07 PM

## 2019-05-22 NOTE — Progress Notes (Signed)
   Assessment/Plan: ADMITTED WITH DIARRHEA/ABDOMINAL PAIN DUE TO C DIFF COLITIS.  PLAN: 1. I PERSONALLY SPOKE WITH LAB AND NURSING. WE WILL COLLECT STOOL TODAY FOR GI PANEL.  1337: 2. C DIFF PCR POSITIVE. AGREE WITH VANCOMYCIN 125 MG Q6H FOR 14 DAYS. 3. LACTOSE FREE DIET. [place]. 5. STEROIDS CONTRAINDICATED AT THIS TIME.    Subjective: Since I last evaluated the patient PT HAVIGN STOOL BUT NO SAMPLE SENT. PT HAVING MULTIPLE LOOSE STOOLS but less today. ABDOMINAL PAIN IS IMPROVED. TOLERATING POs.  Objective: Vital signs in last 24 hours: Vitals:   05/22/19 0623 05/22/19 0745  BP: 127/74   Pulse: 95   Resp:    Temp:    SpO2: 92% 94%   General appearance: alert, cooperative and no distress Resp: clear to auscultation bilaterally Cardio: regular rate and rhythm GI: soft, MILDLY tender IN LLQ, NO REBOUND OR GUARDING; bowel sounds normal;   Lab Results: C DIFF PCR: POSITIVE K 2.8  Studies/Results: No results found.  Medications: I have reviewed the patient's current medications.

## 2019-05-22 NOTE — Progress Notes (Signed)
CRITICAL VALUE ALERT  Critical Value:  CDiff positive  Date & Time Notied:  05/22/2019 05/22/2019  Provider Notified: Dr. Dyann Kief  Orders Received/Actions taken: No orders given at this time

## 2019-05-23 DIAGNOSIS — A0472 Enterocolitis due to Clostridium difficile, not specified as recurrent: Principal | ICD-10-CM

## 2019-05-23 LAB — BASIC METABOLIC PANEL
Anion gap: 8 (ref 5–15)
BUN: <5 mg/dL — ABNORMAL LOW (ref 8–23)
CO2: 22 mmol/L (ref 22–32)
Calcium: 7.9 mg/dL — ABNORMAL LOW (ref 8.9–10.3)
Chloride: 109 mmol/L (ref 98–111)
Creatinine, Ser: 0.82 mg/dL (ref 0.44–1.00)
GFR calc Af Amer: >60 mL/min (ref >60)
GFR calc non Af Amer: >60 mL/min (ref >60)
Glucose, Bld: 118 mg/dL — ABNORMAL HIGH (ref 70–99)
Potassium: 3.4 mmol/L — ABNORMAL LOW (ref 3.5–5.1)
Sodium: 139 mmol/L (ref 135–145)

## 2019-05-23 LAB — GASTROINTESTINAL PANEL BY PCR, STOOL (REPLACES STOOL CULTURE)

## 2019-05-23 MED ORDER — FAMOTIDINE 20 MG PO TABS
20.0000 mg | ORAL_TABLET | Freq: Every day | ORAL | Status: DC
  Administered 2019-05-23 – 2019-05-25 (×3): 20 mg via ORAL
  Filled 2019-05-23 (×3): qty 1

## 2019-05-23 MED ORDER — MAGNESIUM SULFATE 2 GM/50ML IV SOLN
2.0000 g | Freq: Once | INTRAVENOUS | Status: AC
  Administered 2019-05-23: 2 g via INTRAVENOUS
  Filled 2019-05-23: qty 50

## 2019-05-23 MED ORDER — POTASSIUM CHLORIDE CRYS ER 20 MEQ PO TBCR
40.0000 meq | EXTENDED_RELEASE_TABLET | ORAL | Status: AC
  Administered 2019-05-23 (×3): 40 meq via ORAL
  Filled 2019-05-23 (×3): qty 2

## 2019-05-23 NOTE — Progress Notes (Signed)
PROGRESS NOTE    [name]  QQI:[id number] DOB: 09-29-1951 DOA: 05/20/2019 PCP: Jalene Mullet, PA-C     Brief Narrative:  68 y.o. female, with history of ulcerative colitis recent diagnosis of ulcerative pancolitis in June 2020, followed by GI as outpatient, atrial fibrillation on anticoagulation with Eliquis, COPD came to hospital with complaints of abdominal pain and diarrhea.  Patient says that she had multiple loose bowel movements.  Also generalized abdominal pain.  CT scan of the abdomen pelvis done in the ED shows diffuse pancolitis infectious versus inflammatory.  She was recently seen by gastroenterology as outpatient. Denies vomiting but complains of nausea Denies chest pain or shortness of breath. Complains of fever, but has been afebrile in the hospital. Lab work showed WBC 13.4 Patient empirically started on Cipro and Flagyl.  C. difficile PCR and GI pathogen panel ordered.   Assessment & Plan: 1-C. difficile pancolitis -C. difficile antigen and toxin positive -GI panel pending -IV ciprofloxacin and Flagyl has been discontinued; patient started on vancomycin 125 mg every 6 hours and will continue treatment for a total of 14 days.. -Continue supportive care.  2-history of ulcerative colitis  -Continue Colazal twice a day -Steroids contraindicated in the presence of acute infection -Follow GI service recommendations and supportive care.  3-chronic atrial fibrillation -Rate controlled -Continue metoprolol and Eliquis  4-COPD -Stable and compensated -Continue PRN albuterol -Continue Dulera  5-depression/anxiety -No suicidal ideation or hallucination -Continue home psychotropic medications.  DVT prophylaxis: chronically on Eliquis.  Code Status: Full code Family Communication: No family bedside. Disposition Plan: Remains inpatient, continue supportive care; started patient on Vancomycin orally for positive C. Diff infection.  Consultants:    Gastroenterology service  Procedures:   See below for x-ray report.  Antimicrobials:  Anti-infectives (From admission, onward)   Start     Dose/Rate Route Frequency Ordered Stop   05/22/19 1400  vancomycin (VANCOCIN) 50 mg/mL oral solution 125 mg     125 mg Oral 4 times daily 05/22/19 1233 06/01/19 1359   05/21/19 0800  ciprofloxacin (CIPRO) IVPB 400 mg  Status:  Discontinued     400 mg 200 mL/hr over 60 Minutes Intravenous Every 12 hours 05/21/19 0213 05/22/19 1233   05/21/19 0400  metroNIDAZOLE (FLAGYL) IVPB 500 mg  Status:  Discontinued     500 mg 100 mL/hr over 60 Minutes Intravenous Every 8 hours 05/21/19 0213 05/22/19 1233   05/20/19 2000  ciprofloxacin (CIPRO) tablet 500 mg  Status:  Discontinued     500 mg Oral  Once 05/20/19 1951 05/20/19 1952   05/20/19 2000  metroNIDAZOLE (FLAGYL) tablet 500 mg  Status:  Discontinued     500 mg Oral  Once 05/20/19 1951 05/20/19 1952   05/20/19 2000  ciprofloxacin (CIPRO) IVPB 400 mg     400 mg 200 mL/hr over 60 Minutes Intravenous  Once 05/20/19 1952 05/20/19 2147   05/20/19 2000  metroNIDAZOLE (FLAGYL) IVPB 500 mg     500 mg 100 mL/hr over 60 Minutes Intravenous  Once 05/20/19 1952 05/20/19 2148       Subjective: No fever, no chest pain, no shortness of breath.  Reports no nausea, no vomiting and having improvement in her abdominal discomfort and diarrhea.  Patient is tolerating p.o.'s better.  Objective: Vitals:   05/22/19 2106 05/22/19 2141 05/23/19 0607 05/23/19 0749  BP: (!) 122/49 136/77 102/60   Pulse: 72 89 91   Resp: 17 16 17    Temp: 99.2 F (37.3 C) 99.1 F (  37.3 C) 99.1 F (37.3 C)   TempSrc: Oral Oral Oral   SpO2: 94% 99% 91% 93%  Weight:      Height:        Intake/Output Summary (Last 24 hours) at 05/23/2019 1444 Last data filed at 05/23/2019 0900 Gross per 24 hour  Intake 2492.95 ml  Output -  Net 2492.95 ml   Filed Weights   05/20/19 1506  Weight: 90.7 kg    Examination: General exam: Alert,  awake, oriented x 3; no chest pain, no shortness of breath, reports no nausea vomiting.  Patient expressed improvement in her cramping discomfort in the abdomen and has had some decrease in diarrhea. Respiratory system: Clear to auscultation. Respiratory effort normal. Cardiovascular system:Rate controlled. No murmurs, rubs, gallops. Gastrointestinal system: Abdomen is nondistended, soft and nontender. No organomegaly or masses felt. Normal bowel sounds heard. Central nervous system: Alert and oriented. No focal neurological deficits. Extremities: No C/C/E, +pedal pulses Skin: No rashes, lesions or ulcers Psychiatry: Judgement and insight appear normal. Mood & affect appropriate.    Data Reviewed: I have personally reviewed following labs and imaging studies  CBC: Recent Labs  Lab 05/20/19 1604 05/21/19 0608 05/22/19 0642  WBC 13.4* 10.9* 8.7  NEUTROABS 9.5*  --   --   HGB 12.8 11.8* 11.1*  HCT 39.8 37.6 35.2*  MCV 93.9 93.8 94.4  PLT 305 233 009   Basic Metabolic Panel: Recent Labs  Lab 05/20/19 1604 05/21/19 0608 05/22/19 0642 05/23/19 0952  NA 134* 137 138 139  K 3.6 3.4* 2.8* 3.4*  CL 101 105 108 109  CO2 25 24 20* 22  GLUCOSE 125* 125* 126* 118*  BUN 7* 6* <5* <5*  CREATININE 0.80 0.79 0.75 0.82  CALCIUM 8.8* 8.4* 8.1* 7.9*   GFR: Estimated Creatinine Clearance: 70.6 mL/min (by C-G formula based on SCr of 0.82 mg/dL).   Liver Function Tests: Recent Labs  Lab 05/20/19 1604 05/21/19 0608  AST 13* 14*  ALT 13 12  ALKPHOS 88 79  BILITOT 0.7 0.6  PROT 7.2 6.6  ALBUMIN 3.5 3.2*   Recent Labs  Lab 05/20/19 1604  LIPASE 16   Urine analysis:    Component Value Date/Time   COLORURINE STRAW (A) 05/20/2019 2103   APPEARANCEUR CLEAR 05/20/2019 2103   LABSPEC 1.043 (H) 05/20/2019 2103   PHURINE 6.0 05/20/2019 2103   GLUCOSEU NEGATIVE 05/20/2019 2103   HGBUR SMALL (A) 05/20/2019 2103   BILIRUBINUR NEGATIVE 05/20/2019 2103   KETONESUR 20 (A) 05/20/2019 2103    PROTEINUR NEGATIVE 05/20/2019 2103   NITRITE NEGATIVE 05/20/2019 2103   LEUKOCYTESUR NEGATIVE 05/20/2019 2103    Recent Results (from the past 240 hour(s))  Urine culture     Status: None   Collection Time: 05/20/19  9:03 PM   Specimen: Urine, Clean Catch  Result Value Ref Range Status   Specimen Description   Final    URINE, CLEAN CATCH Performed at [facility], [address] [place], [place] [address]    Special Requests   Final    NONE Performed at [facility], [address], [place], [place] [address]    Culture   Final    Multiple bacterial morphotypes present, none predominant. Suggest appropriate recollection if clinically indicated.   Report Status 05/22/2019 FINAL  Final  SARS CORONAVIRUS 2 Nasal Swab Aptima Multi Swab     Status: None   Collection Time: 05/20/19  9:20 PM   Specimen: Aptima Multi Swab; Nasal Swab  Result Value  Ref Range Status   SARS Coronavirus 2 NEGATIVE NEGATIVE Final    Comment: (NOTE) SARS-CoV-2 target nucleic acids are NOT DETECTED. The SARS-CoV-2 RNA is generally detectable in upper and lower respiratory specimens during the acute phase of infection. Negative results do not preclude SARS-CoV-2 infection, do not rule out co-infections with other pathogens, and should not be used as the sole basis for treatment or other patient management decisions. Negative results must be combined with clinical observations, patient history, and epidemiological information. The expected result is Negative. Fact Sheet for Patients: SugarRoll.be Fact Sheet for Healthcare Providers: https://www.woods-mathews.com/ This test is not yet approved or cleared by the [place] FDA and  has been authorized for detection and/or diagnosis of SARS-CoV-2 by FDA under an Emergency Use Authorization (EUA). This EUA will remain  in effect (meaning this test can be used) for the duration of the COVID-19 declaration under  Section 56 4(b)(1) of the Act, 21 U.S.C. section 360bbb-3(b)(1), unless the authorization is terminated or revoked sooner. Performed at [facility], [place] [address], [place],  [address]   SARS Coronavirus 2 [facility] order, Performed in [facility] hospital lab) Nasopharyngeal Nasopharyngeal Swab     Status: None   Collection Time: 05/21/19  8:16 AM   Specimen: Nasopharyngeal Swab  Result Value Ref Range Status   SARS Coronavirus 2 NEGATIVE NEGATIVE Final    Comment: (NOTE) If result is NEGATIVE SARS-CoV-2 target nucleic acids are NOT DETECTED. The SARS-CoV-2 RNA is generally detectable in upper and lower  respiratory specimens during the acute phase of infection. The lowest  concentration of SARS-CoV-2 viral copies this assay can detect is 250  copies / mL. A negative result does not preclude SARS-CoV-2 infection  and should not be used as the sole basis for treatment or other  patient management decisions.  A negative result may occur with  improper specimen collection / handling, submission of specimen other  than nasopharyngeal swab, presence of viral mutation(s) within the  areas targeted by this assay, and inadequate number of viral copies  (<250 copies / mL). A negative result must be combined with clinical  observations, patient history, and epidemiological information. If result is POSITIVE SARS-CoV-2 target nucleic acids are DETECTED. The SARS-CoV-2 RNA is generally detectable in upper and lower  respiratory specimens dur ing the acute phase of infection.  Positive  results are indicative of active infection with SARS-CoV-2.  Clinical  correlation with patient history and other diagnostic information is  necessary to determine patient infection status.  Positive results do  not rule out bacterial infection or co-infection with other viruses. If result is PRESUMPTIVE POSTIVE SARS-CoV-2 nucleic acids MAY BE PRESENT.   A presumptive positive result was  obtained on the submitted specimen  and confirmed on repeat testing.  While 2019 novel coronavirus  (SARS-CoV-2) nucleic acids may be present in the submitted sample  additional confirmatory testing may be necessary for epidemiological  and / or clinical management purposes  to differentiate between  SARS-CoV-2 and other Sarbecovirus currently known to infect humans.  If clinically indicated additional testing with an alternate test  methodology [id number]) is advised. The SARS-CoV-2 RNA is generally  detectable in upper and lower respiratory sp ecimens during the acute  phase of infection. The expected result is Negative. Fact Sheet for Patients:  StrictlyIdeas.no Fact Sheet for Healthcare Providers: BankingDealers.co.za This test is not yet approved or cleared by the [place] FDA and has been authorized for detection and/or diagnosis of SARS-CoV-2  by FDA under an Emergency Use Authorization (EUA).  This EUA will remain in effect (meaning this test can be used) for the duration of the COVID-19 declaration under Section 564(b)(1) of the Act, 21 U.S.C. section 360bbb-3(b)(1), unless the authorization is terminated or revoked sooner. Performed at [facility], [address] [place], [place] [address]   Gastrointestinal Panel by PCR , Stool     Status: None   Collection Time: 05/22/19 10:30 AM   Specimen: Stool  Result Value Ref Range Status   Campylobacter species NOT DETECTED NOT DETECTED Final   Plesimonas shigelloides NOT DETECTED NOT DETECTED Final   Salmonella species NOT DETECTED NOT DETECTED Final   Yersinia enterocolitica NOT DETECTED NOT DETECTED Final   Vibrio species NOT DETECTED NOT DETECTED Final   Vibrio cholerae NOT DETECTED NOT DETECTED Final   Enteroaggregative E coli (EAEC) NOT DETECTED NOT DETECTED Final   Enteropathogenic E coli (EPEC) NOT DETECTED NOT DETECTED Final   Enterotoxigenic E coli (ETEC) NOT DETECTED  NOT DETECTED Final   Shiga like toxin producing E coli (STEC) NOT DETECTED NOT DETECTED Final   Shigella/Enteroinvasive E coli (EIEC) NOT DETECTED NOT DETECTED Final   Cryptosporidium NOT DETECTED NOT DETECTED Final   Cyclospora cayetanensis NOT DETECTED NOT DETECTED Final   Entamoeba histolytica NOT DETECTED NOT DETECTED Final   Giardia lamblia NOT DETECTED NOT DETECTED Final   Adenovirus F40/41 NOT DETECTED NOT DETECTED Final   Astrovirus NOT DETECTED NOT DETECTED Final   Norovirus GI/GII NOT DETECTED NOT DETECTED Final   Rotavirus A NOT DETECTED NOT DETECTED Final   Sapovirus (I, II, IV, and V) NOT DETECTED NOT DETECTED Final    Comment: Performed at [facility], [address] [place], [place] [address]  C difficile quick scan w PCR reflex     Status: Abnormal   Collection Time: 05/22/19 10:30 AM   Specimen: Stool  Result Value Ref Range Status   C Diff antigen POSITIVE (A) NEGATIVE Final   C Diff toxin POSITIVE (A) NEGATIVE Final   C Diff interpretation Toxin producing C. difficile detected.  Final    Comment: CRITICAL RESULT CALLED TO, READ BACK BY AND VERIFIED WITH:  M.TAYLOR @ 3235 ON 8.23.20 BY LBB Performed at [facility], [address] [place], [place] [address]      Radiology Studies: No results found.  Scheduled Meds: . apixaban  5 mg Oral BID  . balsalazide  2,250 mg Oral BID  . buPROPion  150 mg Oral Daily  . busPIRone  7.5 mg Oral BID  . calcium carbonate  1 tablet Oral TID WC  . DULoxetine  120 mg Oral Daily  . famotidine  20 mg Oral Daily  . ketotifen  1 drop Both Eyes Daily  . loratadine  10 mg Oral Daily  . metoprolol tartrate  12.5 mg Oral BID  . mometasone-formoterol  2 puff Inhalation BID  . potassium chloride  40 mEq Oral Q4H  . pravastatin  20 mg Oral Daily  . pregabalin  100 mg Oral BID  . traZODone  100 mg Oral QHS  . vancomycin  125 mg Oral QID   Continuous Infusions: . sodium chloride 75 mL/hr at 05/23/19 0949     LOS: 2  days    Time spent: 35 minutes    Barton Dubois, MD [facility] Pager [phone]   05/23/2019, 2:44 PM

## 2019-05-23 NOTE — Care Management Important Message (Signed)
Important Message  Patient Details  Name: [name] MRN: [id number] Date of Birth: [birth year]   Medicare Important Message Given:  Yes(given to nurse to deliver to patient due to contact precautions)     Tommy Medal 05/23/2019, 10:11 AM

## 2019-05-23 NOTE — Progress Notes (Signed)
    Subjective: No abdominal pain. Not much of an appetite. Notes stool is still watery but less frequent than on admission. No N/V.   Objective: Vital signs in last 24 hours: Temp:  [98.9 F (37.2 C)-99.2 F (37.3 C)] 99.1 F (37.3 C) (08/24 0607) Pulse Rate:  [72-91] 91 (08/24 0607) Resp:  [16-17] 17 (08/24 0607) BP: (102-136)/(49-77) 102/60 (08/24 0607) SpO2:  [91 %-99 %] 93 % (08/24 0749) Last BM Date: 05/23/19 General:   Alert and oriented, pleasant Head:  Normocephalic and atraumatic. Abdomen:  Bowel sounds present, soft, non-tender, non-distended. No HSM or hernias noted. No rebound or guarding.  Neurologic:  Alert and  oriented x4;  grossly normal neurologically.   Intake/Output from previous day: 08/23 0701 - 08/24 0700 In: 1946.3 [I.V.:1946.3] Out: -  Intake/Output this shift: No intake/output data recorded.  Lab Results: Recent Labs    05/20/19 1604 05/21/19 0608 05/22/19 0642  WBC 13.4* 10.9* 8.7  HGB 12.8 11.8* 11.1*  HCT 39.8 37.6 35.2*  PLT 305 233 292   BMET Recent Labs    05/20/19 1604 05/21/19 0608 05/22/19 0642  NA 134* 137 138  K 3.6 3.4* 2.8*  CL 101 105 108  CO2 25 24 20*  GLUCOSE 125* 125* 126*  BUN 7* 6* <5*  CREATININE 0.80 0.79 0.75  CALCIUM 8.8* 8.4* 8.1*   LFT Recent Labs    05/20/19 1604 05/21/19 0608  PROT 7.2 6.6  ALBUMIN 3.5 3.2*  AST 13* 14*  ALT 13 12  ALKPHOS 88 79  BILITOT 0.7 0.6    Assessment: 68 year old female with history of ulcerative pancolitis, presenting with Cdiff infection. GI pathogen panel remains pending. Vanc 125 mg oral QID started yesterday for total of 14 days. Clinically improved with less frequent stool this morning.   Hypokalemia in setting of acute diarrhea. Will recheck BMP today.   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 Use lowest dosage and frequency  of PPI for control of symptoms.   Plan: Recheck BMP Vancomycin 125 mg orally QID for total of 14 days Lactose-free diet Continue Colazal, steroids contraindicated Monitor for worsening of symptoms Discussed cleaning methods at home   Annitta Needs, PhD, ANP-BC [facility]      LOS: 2 days    05/23/2019, 8:17 AM

## 2019-05-24 LAB — CBC
HCT: 36 % (ref 36.0–46.0)
Hemoglobin: 11.3 g/dL — ABNORMAL LOW (ref 12.0–15.0)
MCH: 29.7 pg (ref 26.0–34.0)
MCHC: 31.4 g/dL (ref 30.0–36.0)
MCV: 94.5 fL (ref 80.0–100.0)
Platelets: 212 10*3/uL (ref 150–400)
RBC: 3.81 MIL/uL — ABNORMAL LOW (ref 3.87–5.11)
RDW: 15.1 % (ref 11.5–15.5)
WBC: 8.3 10*3/uL (ref 4.0–10.5)
nRBC: 0 % (ref 0.0–0.2)

## 2019-05-24 LAB — BASIC METABOLIC PANEL
Anion gap: 6 (ref 5–15)
BUN: <5 mg/dL — ABNORMAL LOW (ref 8–23)
CO2: 23 mmol/L (ref 22–32)
Calcium: 8 mg/dL — ABNORMAL LOW (ref 8.9–10.3)
Chloride: 108 mmol/L (ref 98–111)
Creatinine, Ser: 0.77 mg/dL (ref 0.44–1.00)
GFR calc Af Amer: >60 mL/min (ref >60)
GFR calc non Af Amer: >60 mL/min (ref >60)
Glucose, Bld: 102 mg/dL — ABNORMAL HIGH (ref 70–99)
Potassium: 3.3 mmol/L — ABNORMAL LOW (ref 3.5–5.1)
Sodium: 137 mmol/L (ref 135–145)

## 2019-05-24 LAB — MAGNESIUM: Magnesium: 2.1 mg/dL (ref 1.7–2.4)

## 2019-05-24 MED ORDER — PANTOPRAZOLE SODIUM 40 MG PO TBEC
40.0000 mg | DELAYED_RELEASE_TABLET | Freq: Every day | ORAL | Status: DC
  Administered 2019-05-24 – 2019-05-25 (×2): 40 mg via ORAL
  Filled 2019-05-24 (×2): qty 1

## 2019-05-24 MED ORDER — HYDROCORTISONE (PERIANAL) 2.5 % EX CREA
TOPICAL_CREAM | Freq: Two times a day (BID) | CUTANEOUS | Status: DC
  Administered 2019-05-24 – 2019-05-30 (×11): via RECTAL
  Filled 2019-05-24: qty 28.35

## 2019-05-24 MED ORDER — POTASSIUM CHLORIDE CRYS ER 20 MEQ PO TBCR
20.0000 meq | EXTENDED_RELEASE_TABLET | Freq: Every day | ORAL | Status: DC
  Administered 2019-05-24: 20 meq via ORAL
  Filled 2019-05-24 (×2): qty 1

## 2019-05-24 NOTE — Progress Notes (Signed)
PROGRESS NOTE    [name]  DTO:[id number] DOB: [birth year] DOA: 05/20/2019 PCP: Jalene Mullet, PA-C     Brief Narrative:  68 y.o. female, with history of ulcerative colitis recent diagnosis of ulcerative pancolitis in June 2020, followed by GI as outpatient, atrial fibrillation on anticoagulation with Eliquis, COPD came to hospital with complaints of abdominal pain and diarrhea.  Patient says that she had multiple loose bowel movements.  Also generalized abdominal pain.  CT scan of the abdomen pelvis done in the ED shows diffuse pancolitis infectious versus inflammatory.  She was recently seen by gastroenterology as outpatient. Denies vomiting but complains of nausea Denies chest pain or shortness of breath. Complains of fever, but has been afebrile in the hospital. Lab work showed WBC 13.4 Patient empirically started on Cipro and Flagyl.  C. difficile PCR and GI pathogen panel ordered.   Assessment & Plan: 1-C. difficile pancolitis -C. difficile antigen and toxin positive -IV ciprofloxacin and Flagyl has been discontinued; patient started on vancomycin 125 mg every 6 hours and will continue treatment for a total of 14 days.  Currently coursing day 2 out of 14. -Given the still ongoing diarrhea following GI recommendations will consider addition of Questran if no improvement in the amount of BMs today. -Patient educated and encouraged to follow a lactose-free diet. -Continue supportive care. -Anusol cream has been added to assist with rectal discomfort from multiple diarrhea events.  2-history of ulcerative colitis  -Continue Colazal twice a day -Steroids contraindicated in the presence of acute infection -Follow GI service recommendations and supportive care.  3-chronic atrial fibrillation -Rate controlled -Continue metoprolol and Eliquis  4-COPD -Stable and compensated -Continue PRN albuterol -Continue Dulera  5-depression/anxiety -No suicidal ideation or  hallucination -Continue home psychotropic medications.  DVT prophylaxis: chronically on Eliquis.  Code Status: Full code Family Communication: No family bedside. Disposition Plan: Remains inpatient, continue supportive care; started patient on Vancomycin orally for positive C. Diff infection.  Consultants:   Gastroenterology service  Procedures:   See below for x-ray report.  Antimicrobials:  Anti-infectives (From admission, onward)   Start     Dose/Rate Route Frequency Ordered Stop   05/22/19 1400  vancomycin (VANCOCIN) 50 mg/mL oral solution 125 mg     125 mg Oral 4 times daily 05/22/19 1233 06/01/19 1359   05/21/19 0800  ciprofloxacin (CIPRO) IVPB 400 mg  Status:  Discontinued     400 mg 200 mL/hr over 60 Minutes Intravenous Every 12 hours 05/21/19 0213 05/22/19 1233   05/21/19 0400  metroNIDAZOLE (FLAGYL) IVPB 500 mg  Status:  Discontinued     500 mg 100 mL/hr over 60 Minutes Intravenous Every 8 hours 05/21/19 0213 05/22/19 1233   05/20/19 2000  ciprofloxacin (CIPRO) tablet 500 mg  Status:  Discontinued     500 mg Oral  Once 05/20/19 1951 05/20/19 1952   05/20/19 2000  metroNIDAZOLE (FLAGYL) tablet 500 mg  Status:  Discontinued     500 mg Oral  Once 05/20/19 1951 05/20/19 1952   05/20/19 2000  ciprofloxacin (CIPRO) IVPB 400 mg     400 mg 200 mL/hr over 60 Minutes Intravenous  Once 05/20/19 1952 05/20/19 2147   05/20/19 2000  metroNIDAZOLE (FLAGYL) IVPB 500 mg     500 mg 100 mL/hr over 60 Minutes Intravenous  Once 05/20/19 1952 05/20/19 2148       Subjective: No fever, no chest pain, no shortness of breath.  No further episodes of nausea or vomiting.  Currently without  abdominal pain.  Still having significant diarrhea and postprandial loose stools.  Sprays are correcting his overall/sore and feel unable to maintain adequate hydration and nutrition to go home at this point.  Objective: Vitals:   05/23/19 2154 05/24/19 0558 05/24/19 0750 05/24/19 1321  BP: (!) 145/85  123/60  135/71  Pulse: 73 63  69  Resp: 20 18  18   Temp: 99.5 F (37.5 C) 98.7 F (37.1 C)  98.8 F (37.1 C)  TempSrc: Oral Oral  Oral  SpO2: 98% 99% 99% 99%  Weight:      Height:        Intake/Output Summary (Last 24 hours) at 05/24/2019 1802 Last data filed at 05/24/2019 1739 Gross per 24 hour  Intake 2264.73 ml  Output -  Net 2264.73 ml   Filed Weights   05/20/19 1506  Weight: 90.7 kg    Examination: General exam: Alert, awake, oriented x 3; no chest pain, no shortness of breath, reports no nausea and no vomiting.  Patient with over 8 loose stools yesterday and reporting significant postprandial diarrhea.  Expressed feel weak and feeling unable to maintain adequate hydration without IV support. Respiratory system: Clear to auscultation. Respiratory effort normal. Cardiovascular system:RRR. No murmurs, rubs, gallops. Gastrointestinal system: Abdomen is nondistended, soft and nontender. No organomegaly or masses felt. Normal bowel sounds heard. Central nervous system: Alert and oriented. No focal neurological deficits. Extremities: No C/C/E, +pedal pulses Skin: No rashes, lesions or ulcers Psychiatry: Judgement and insight appear normal. Mood & affect appropriate.    Data Reviewed: I have personally reviewed following labs and imaging studies  CBC: Recent Labs  Lab 05/20/19 1604 05/21/19 0608 05/22/19 0642 05/24/19 0600  WBC 13.4* 10.9* 8.7 8.3  NEUTROABS 9.5*  --   --   --   HGB 12.8 11.8* 11.1* 11.3*  HCT 39.8 37.6 35.2* 36.0  MCV 93.9 93.8 94.4 94.5  PLT 305 233 292 176   Basic Metabolic Panel: Recent Labs  Lab 05/20/19 1604 05/21/19 0608 05/22/19 0642 05/23/19 0952 05/24/19 0600  NA 134* 137 138 139 137  K 3.6 3.4* 2.8* 3.4* 3.3*  CL 101 105 108 109 108  CO2 25 24 20* 22 23  GLUCOSE 125* 125* 126* 118* 102*  BUN 7* 6* <5* <5* <5*  CREATININE 0.80 0.79 0.75 0.82 0.77  CALCIUM 8.8* 8.4* 8.1* 7.9* 8.0*  MG  --   --   --   --  2.1   GFR: Estimated  Creatinine Clearance: 72.4 mL/min (by C-G formula based on SCr of 0.77 mg/dL).   Liver Function Tests: Recent Labs  Lab 05/20/19 1604 05/21/19 0608  AST 13* 14*  ALT 13 12  ALKPHOS 88 79  BILITOT 0.7 0.6  PROT 7.2 6.6  ALBUMIN 3.5 3.2*   Recent Labs  Lab 05/20/19 1604  LIPASE 16   Urine analysis:    Component Value Date/Time   COLORURINE STRAW (A) 05/20/2019 2103   APPEARANCEUR CLEAR 05/20/2019 2103   LABSPEC 1.043 (H) 05/20/2019 2103   PHURINE 6.0 05/20/2019 2103   GLUCOSEU NEGATIVE 05/20/2019 2103   HGBUR SMALL (A) 05/20/2019 2103   BILIRUBINUR NEGATIVE 05/20/2019 2103   KETONESUR 20 (A) 05/20/2019 2103   PROTEINUR NEGATIVE 05/20/2019 2103   NITRITE NEGATIVE 05/20/2019 2103   LEUKOCYTESUR NEGATIVE 05/20/2019 2103    Recent Results (from the past 240 hour(s))  Urine culture     Status: None   Collection Time: 05/20/19  9:03 PM   Specimen: Urine, Clean Catch  Result Value Ref Range Status   Specimen Description   Final    URINE, CLEAN CATCH Performed at [facility], [address] [place], [place] [address]    Special Requests   Final    NONE Performed at [facility], [address], [place], [place] [address]    Culture   Final    Multiple bacterial morphotypes present, none predominant. Suggest appropriate recollection if clinically indicated.   Report Status 05/22/2019 FINAL  Final  SARS CORONAVIRUS 2 Nasal Swab Aptima Multi Swab     Status: None   Collection Time: 05/20/19  9:20 PM   Specimen: Aptima Multi Swab; Nasal Swab  Result Value Ref Range Status   SARS Coronavirus 2 NEGATIVE NEGATIVE Final    Comment: (NOTE) SARS-CoV-2 target nucleic acids are NOT DETECTED. The SARS-CoV-2 RNA is generally detectable in upper and lower respiratory specimens during the acute phase of infection. Negative results do not preclude SARS-CoV-2 infection, do not rule out co-infections with other pathogens, and should not be used as the sole basis for treatment or  other patient management decisions. Negative results must be combined with clinical observations, patient history, and epidemiological information. The expected result is Negative. Fact Sheet for Patients: SugarRoll.be Fact Sheet for Healthcare Providers: https://www.woods-mathews.com/ This test is not yet approved or cleared by the [place] FDA and  has been authorized for detection and/or diagnosis of SARS-CoV-2 by FDA under an Emergency Use Authorization (EUA). This EUA will remain  in effect (meaning this test can be used) for the duration of the COVID-19 declaration under Section 56 4(b)(1) of the Act, 21 U.S.C. section 360bbb-3(b)(1), unless the authorization is terminated or revoked sooner. Performed at [facility], [place] [address] [place], [place] [address]   SARS Coronavirus 2 [facility] order, Performed in [facility] hospital lab) Nasopharyngeal Nasopharyngeal Swab     Status: None   Collection Time: 05/21/19  8:16 AM   Specimen: Nasopharyngeal Swab  Result Value Ref Range Status   SARS Coronavirus 2 NEGATIVE NEGATIVE Final    Comment: (NOTE) If result is NEGATIVE SARS-CoV-2 target nucleic acids are NOT DETECTED. The SARS-CoV-2 RNA is generally detectable in upper and lower  respiratory specimens during the acute phase of infection. The lowest  concentration of SARS-CoV-2 viral copies this assay can detect is 250  copies / mL. A negative result does not preclude SARS-CoV-2 infection  and should not be used as the sole basis for treatment or other  patient management decisions.  A negative result may occur with  improper specimen collection / handling, submission of specimen other  than nasopharyngeal swab, presence of viral mutation(s) within the  areas targeted by this assay, and inadequate number of viral copies  (<250 copies / mL). A negative result must be combined with clinical  observations, patient history, and  epidemiological information. If result is POSITIVE SARS-CoV-2 target nucleic acids are DETECTED. The SARS-CoV-2 RNA is generally detectable in upper and lower  respiratory specimens dur ing the acute phase of infection.  Positive  results are indicative of active infection with SARS-CoV-2.  Clinical  correlation with patient history and other diagnostic information is  necessary to determine patient infection status.  Positive results do  not rule out bacterial infection or co-infection with other viruses. If result is PRESUMPTIVE POSTIVE SARS-CoV-2 nucleic acids MAY BE PRESENT.   A presumptive positive result was obtained on the submitted specimen  and confirmed on repeat testing.  While 2019 novel coronavirus  (  SARS-CoV-2) nucleic acids may be present in the submitted sample  additional confirmatory testing may be necessary for epidemiological  and / or clinical management purposes  to differentiate between  SARS-CoV-2 and other Sarbecovirus currently known to infect humans.  If clinically indicated additional testing with an alternate test  methodology [phone]) is advised. The SARS-CoV-2 RNA is generally  detectable in upper and lower respiratory sp ecimens during the acute  phase of infection. The expected result is Negative. Fact Sheet for Patients:  StrictlyIdeas.no Fact Sheet for Healthcare Providers: BankingDealers.co.za This test is not yet approved or cleared by the [place] FDA and has been authorized for detection and/or diagnosis of SARS-CoV-2 by FDA under an Emergency Use Authorization (EUA).  This EUA will remain in effect (meaning this test can be used) for the duration of the COVID-19 declaration under Section 564(b)(1) of the Act, 21 U.S.C. section 360bbb-3(b)(1), unless the authorization is terminated or revoked sooner. Performed at [facility], [address] [place], [place] [address]   Gastrointestinal  Panel by PCR , Stool     Status: None   Collection Time: 05/22/19 10:30 AM   Specimen: Stool  Result Value Ref Range Status   Campylobacter species NOT DETECTED NOT DETECTED Final   Plesimonas shigelloides NOT DETECTED NOT DETECTED Final   Salmonella species NOT DETECTED NOT DETECTED Final   Yersinia enterocolitica NOT DETECTED NOT DETECTED Final   Vibrio species NOT DETECTED NOT DETECTED Final   Vibrio cholerae NOT DETECTED NOT DETECTED Final   Enteroaggregative E coli (EAEC) NOT DETECTED NOT DETECTED Final   Enteropathogenic E coli (EPEC) NOT DETECTED NOT DETECTED Final   Enterotoxigenic E coli (ETEC) NOT DETECTED NOT DETECTED Final   Shiga like toxin producing E coli (STEC) NOT DETECTED NOT DETECTED Final   Shigella/Enteroinvasive E coli (EIEC) NOT DETECTED NOT DETECTED Final   Cryptosporidium NOT DETECTED NOT DETECTED Final   Cyclospora cayetanensis NOT DETECTED NOT DETECTED Final   Entamoeba histolytica NOT DETECTED NOT DETECTED Final   Giardia lamblia NOT DETECTED NOT DETECTED Final   Adenovirus F40/41 NOT DETECTED NOT DETECTED Final   Astrovirus NOT DETECTED NOT DETECTED Final   Norovirus GI/GII NOT DETECTED NOT DETECTED Final   Rotavirus A NOT DETECTED NOT DETECTED Final   Sapovirus (I, II, IV, and V) NOT DETECTED NOT DETECTED Final    Comment: Performed at [facility], [address] [place], [place] [address]  C difficile quick scan w PCR reflex     Status: Abnormal   Collection Time: 05/22/19 10:30 AM   Specimen: Stool  Result Value Ref Range Status   C Diff antigen POSITIVE (A) NEGATIVE Final   C Diff toxin POSITIVE (A) NEGATIVE Final   C Diff interpretation Toxin producing C. difficile detected.  Final    Comment: CRITICAL RESULT CALLED TO, READ BACK BY AND VERIFIED WITH:  M.TAYLOR @ 5638 ON 8.23.20 BY LBB Performed at [facility], [address] [place], [place] [address]      Radiology Studies: No results found.  Scheduled Meds: . apixaban  5 mg  Oral BID  . balsalazide  2,250 mg Oral BID  . buPROPion  150 mg Oral Daily  . busPIRone  7.5 mg Oral BID  . DULoxetine  120 mg Oral Daily  . famotidine  20 mg Oral Daily  . hydrocortisone   Rectal BID  . ketotifen  1 drop Both Eyes Daily  . loratadine  10 mg Oral Daily  . metoprolol tartrate  12.5 mg Oral BID  . mometasone-formoterol  2 puff Inhalation BID  . pantoprazole  40 mg Oral Daily  . potassium chloride  20 mEq Oral Daily  . pravastatin  20 mg Oral Daily  . pregabalin  100 mg Oral BID  . traZODone  100 mg Oral QHS  . vancomycin  125 mg Oral QID   Continuous Infusions: . sodium chloride 50 mL/hr at 05/24/19 0300     LOS: 3 days    Time spent: 30 minutes   Barton Dubois, MD [facility] Pager [phone]   05/24/2019, 6:02 PM

## 2019-05-24 NOTE — Progress Notes (Signed)
    Subjective: Notes 5-8 loose stools yesterday. Rectum is raw/sore. Requesting cream. Postprandial loose stools. Yesterday had felt frequency was improving but today feels persistent. RLQ discomfort yesterday evening now resolved. No N/V. This afternoon will make 48 hours on oral vanc.   Objective: Vital signs in last 24 hours: Temp:  [98.7 F (37.1 C)-99.5 F (37.5 C)] 98.7 F (37.1 C) (08/25 0558) Pulse Rate:  [63-73] 63 (08/25 0558) Resp:  [18-20] 18 (08/25 0558) BP: (123-145)/(60-85) 123/60 (08/25 0558) SpO2:  [94 %-99 %] 99 % (08/25 0750) Last BM Date: 05/23/19 General:   Alert and oriented, pleasant Head:  Normocephalic and atraumatic. Abdomen:  Bowel sounds present, soft, non-tender, non-distended.  Neurologic:  Alert and  oriented x4 Psych:  Alert and cooperative. Normal mood and affect.  Intake/Output from previous day: 08/24 0701 - 08/25 0700 In: 1241.7 [P.O.:720; I.V.:472.3; IV Piggyback:49.4] Out: -  Intake/Output this shift: No intake/output data recorded.  Lab Results: Recent Labs    05/22/19 0642 05/24/19 0600  WBC 8.7 8.3  HGB 11.1* 11.3*  HCT 35.2* 36.0  PLT 292 212   BMET Recent Labs    05/22/19 0642 05/23/19 0952 05/24/19 0600  NA 138 139 137  K 2.8* 3.4* 3.3*  CL 108 109 108  CO2 20* 22 23  GLUCOSE 126* 118* 102*  BUN <5* <5* <5*  CREATININE 0.75 0.82 0.77  CALCIUM 8.1* 7.9* 8.0*    Assessment: 68 year old female with history of ulcerative pancolitis, presenting with Cdiff infection. GI pathogen panel negative. Will have completed 48 hours of oral vancomycin this afternoon. Although she felt clinically improved yesterday, notes persistent loose stool, specifically postprandially without improvement in frequency. Continue oral vanc and reassess tomorrow. Could consider adding questran for supportive measures.    Hypokalemia in setting of diarrhea: continue potassium supplementation.   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 Use lowest dosage and frequency of PPI for control of symptoms.   Plan: Vancomycin for total of 14 days Consider adding questran if no improvement today Lactose-free diet Continue Colazal Anusol per rectum Reassess in the morning   Annitta Needs, PhD, ANP-BC [facility]    LOS: 3 days    05/24/2019, 8:25 AM

## 2019-05-25 DIAGNOSIS — K5100 Ulcerative (chronic) pancolitis without complications: Secondary | ICD-10-CM

## 2019-05-25 LAB — BASIC METABOLIC PANEL
Anion gap: 8 (ref 5–15)
BUN: <5 mg/dL — ABNORMAL LOW (ref 8–23)
CO2: 22 mmol/L (ref 22–32)
Calcium: 7.8 mg/dL — ABNORMAL LOW (ref 8.9–10.3)
Chloride: 109 mmol/L (ref 98–111)
Creatinine, Ser: 0.65 mg/dL (ref 0.44–1.00)
GFR calc Af Amer: >60 mL/min (ref >60)
GFR calc non Af Amer: >60 mL/min (ref >60)
Glucose, Bld: 93 mg/dL (ref 70–99)
Potassium: 2.9 mmol/L — ABNORMAL LOW (ref 3.5–5.1)
Sodium: 139 mmol/L (ref 135–145)

## 2019-05-25 MED ORDER — POTASSIUM CHLORIDE CRYS ER 20 MEQ PO TBCR
40.0000 meq | EXTENDED_RELEASE_TABLET | Freq: Three times a day (TID) | ORAL | Status: AC
  Administered 2019-05-25 (×3): 40 meq via ORAL
  Filled 2019-05-25 (×3): qty 2

## 2019-05-25 MED ORDER — POTASSIUM CHLORIDE IN NACL 40-0.9 MEQ/L-% IV SOLN
INTRAVENOUS | Status: DC
  Administered 2019-05-25: 15:00:00 50 mL/h via INTRAVENOUS

## 2019-05-25 MED ORDER — BALSALAZIDE DISODIUM 750 MG PO CAPS
2250.0000 mg | ORAL_CAPSULE | Freq: Three times a day (TID) | ORAL | Status: DC
  Administered 2019-05-25 – 2019-06-15 (×57): 2250 mg via ORAL

## 2019-05-25 MED ORDER — FAMOTIDINE 20 MG PO TABS
20.0000 mg | ORAL_TABLET | Freq: Two times a day (BID) | ORAL | Status: DC
  Administered 2019-05-25 – 2019-06-15 (×42): 20 mg via ORAL
  Filled 2019-05-25 (×42): qty 1

## 2019-05-25 NOTE — Care Management Important Message (Signed)
Important Message  Patient Details  Name: [name] MRN: [id number] Date of Birth: 1951-04-24   Medicare Important Message Given:  Yes(given to nurse to deliver to patient due to contact precautions)     Tommy Medal 05/25/2019, 2:14 PM

## 2019-05-25 NOTE — Progress Notes (Signed)
Subjective:  Feels about the same. Up at least five times during night for BM. The amount of stool may have decreased slightly. Some abdominal cramping. No vomiting. Some fresh blood per rectum.   Objective: Vital signs in last 24 hours: Temp:  [98.8 F (37.1 C)-98.9 F (37.2 C)] 98.9 F (37.2 C) (08/25 2216) Pulse Rate:  [68-84] 74 (08/26 0651) Resp:  [18] 18 (08/26 0651) BP: (114-135)/(54-71) 119/54 (08/26 0651) SpO2:  [83 %-100 %] 97 % (08/26 0722) Last BM Date: 05/23/19 General:   Alert,  Well-developed, well-nourished, pleasant and cooperative in NAD Head:  Normocephalic and atraumatic. Eyes:  Sclera clear, no icterus.  Abdomen:  Soft, nontender and nondistended.   Normal bowel sounds, without guarding, and without rebound.   Extremities:  Without clubbing, deformity or edema. Neurologic:  Alert and  oriented x4;  grossly normal neurologically. Skin:  Intact without significant lesions or rashes. Psych:  Alert and cooperative. Normal mood and affect.  Intake/Output from previous day: 08/25 0701 - 08/26 0700 In: 2829.1 [P.O.:720; I.V.:2109.1] Out: -  Intake/Output this shift: No intake/output data recorded.  Lab Results: CBC Recent Labs    05/24/19 0600  WBC 8.3  HGB 11.3*  HCT 36.0  MCV 94.5  PLT 212   BMET Recent Labs    05/23/19 0952 05/24/19 0600 05/25/19 0618  NA 139 137 139  K 3.4* 3.3* 2.9*  CL 109 108 109  CO2 22 23 22   GLUCOSE 118* 102* 93  BUN <5* <5* <5*  CREATININE 0.82 0.77 0.65  CALCIUM 7.9* 8.0* 7.8*   LFTs No results for input(s): BILITOT, BILIDIR, IBILI, ALKPHOS, AST, ALT, PROT, ALBUMIN in the last 72 hours. No results for input(s): LIPASE in the last 72 hours. PT/INR No results for input(s): LABPROT, INR in the last 72 hours.    Imaging Studies: Dg Chest 2 View  Result Date: 05/20/2019 CLINICAL DATA:  Abdominal cramping with nausea fever. EXAM: CHEST - 2 VIEW COMPARISON:  02/26/2018 FINDINGS: Cardiopericardial silhouette is at  upper limits of normal for size. Hiatal hernia noted. The lungs are clear without focal pneumonia, edema, pneumothorax or pleural effusion. Interstitial markings are diffusely coarsened with chronic features. The visualized bony structures of the thorax are intact. IMPRESSION: 1. No acute cardiopulmonary findings 2. Hiatal hernia. Electronically Signed   By: Misty Stanley M.D.   On: 05/20/2019 19:10   Ct Abdomen Pelvis W Contrast  Result Date: 05/20/2019 CLINICAL DATA:  Abdominal pain with diarrhea. EXAM: CT ABDOMEN AND PELVIS WITH CONTRAST TECHNIQUE: Multidetector CT imaging of the abdomen and pelvis was performed using the standard protocol following bolus administration of intravenous contrast. CONTRAST:  132m OMNIPAQUE IOHEXOL 300 MG/ML  SOLN COMPARISON:  05/01/2016 FINDINGS: Lower chest:  Large hiatal hernia. Hepatobiliary: No suspicious focal abnormality within the liver parenchyma. Gallbladder is surgically absent. No intrahepatic or extrahepatic biliary dilation. Pancreas: No focal mass lesion. No dilatation of the main duct. No intraparenchymal cyst. No peripancreatic edema. Spleen: No splenomegaly. No focal mass lesion. Adrenals/Urinary Tract: No adrenal nodule or mass. 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 Right kidney unremarkable. The urinary bladder appears normal for the degree of distention. Stomach/Bowel: Large hiatal hernia with approximately 75-80% of the stomach in the chest. Duodenum is normally positioned as is the ligament of Treitz. No small bowel wall thickening. No small bowel dilatation. Cecum is positioned in the anterior midline with unremarkable terminal ileum The appendix is not visualized, but there is  no edema or inflammation in the region of the cecum. There is diffuse mild colonic wall thickening, most prominent in the sigmoid segment but extending from the ascending colon to the level of the rectum. This is  associated with pericolonic edema/inflammation. Diverticular changes are noted in the left colon. Vascular/Lymphatic: There is abdominal aortic atherosclerosis without aneurysm. Celiac axis, SMA, and IMA are opacified. Portal vein and superior mesenteric vein are patent. There is no gastrohepatic or hepatoduodenal ligament lymphadenopathy. No intraperitoneal or retroperitoneal lymphadenopathy. No pelvic sidewall lymphadenopathy. Reproductive: The uterus is unremarkable.  There is no adnexal mass. Other: No intraperitoneal free fluid. Musculoskeletal: No worrisome lytic or sclerotic osseous abnormality. IMPRESSION: 1. Diffuse mild colonic wall thickening with pericolonic edema/inflammation. Imaging features are compatible with an infectious/inflammatory colitis. 2. Large hiatal hernia. 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 4. Abdominal aortic atherosclerosis. Aortic Atherosclerosis (ICD10-I70.0). Electronically Signed   By: Misty Stanley M.D.   On: 05/20/2019 19:06  [2 weeks]   Assessment: 68 year old female with history of ulcerative pancolitis, presenting with C. difficile infection.  GI pathogen panel negative.  Currently on 4th day of vancomycin, remains on Colazal.   Hypokalemia in the setting of diarrhea: Continue potassium supplementation per attending.  Chronic GERD: Ideally would avoid PPI in the setting of C. difficile, but given her longstanding history of GERD, multiple EGDs with dilatation of Schatzki ring (most recently February 2020), would advise using lowest dosage and frequency of PPI for control of symptoms.    Plan: 1. Complete 14-day course of oral vancomycin. 2. Lactose-free diet. 3. Increase to maximum dose of Colazal to cover for potential UC flare as well. 4. TUMS D/Cd this am.  5. Continue to monitor.   Laureen Ochs. Bernarda Caffey [facility] [phone] 8/26/20209:33 AM     LOS:  4 days

## 2019-05-25 NOTE — Progress Notes (Signed)
PROGRESS NOTE    [name]  IHW:[id number] DOB: 11-15-1950 DOA: 05/20/2019 PCP: Jalene Mullet, PA-C   Brief Narrative:  68 y.o.female,with history of ulcerative colitis recent diagnosis of ulcerative pancolitis in June 2020, followed by GI as outpatient, atrial fibrillation on anticoagulation with Eliquis, COPD came to hospital with complaints of abdominal pain and diarrhea. Patient says that she had multiple loose bowel movements. Also generalized abdominal pain. CT scan of the abdomen pelvis done in the ED shows diffuse pancolitis infectious versus inflammatory. She was recently seen by gastroenterology as outpatient. Denies vomiting but complains of nausea Denies chest pain or shortness of breath. Complains of fever,but has been afebrile in the hospital. Lab work showed WBC 13.4 Patient empirically started on Cipro and Flagyl.  She has since been transitioned to vancomycin after discovery of C. difficile pancolitis and is currently day 4 of 14.  She continues to have ongoing loose bowel movements.   Assessment & Plan:   Active Problems:   Colitis   Enteritis due to Clostridium difficile  C. difficile pancolitis -IV ciprofloxacin and Flagyl has been discontinued; patient started on vancomycin 125 mg every 6 hours and will continue treatment for a total of 14 days.  Currently day 4/14. -Given the still ongoing diarrhea following GI recommendations with increase in Colazol dose today -Patient educated and encouraged to follow a lactose-free diet. -Continue supportive care. -Anusol cream has been added to assist with rectal discomfort from multiple diarrhea events.  Hypokalemia -Secondary to ongoing bowel movements -Plan to replete orally and recheck a.m. labs along with magnesium -Continue telemetry monitoring  history of ulcerative colitis  -Continue Colazal at higher dose per GI -Steroids contraindicated in the presence of acute infection -Follow GI service  recommendations and supportive care.  chronic atrial fibrillation -Rate controlled -Continue metoprolol and Eliquis  COPD -Stable and compensated -Continue PRN albuterol -Continue Dulera  depression/anxiety -No suicidal ideation or hallucination -Continue home psychotropic medications.   DVT prophylaxis: Eliquis Code Status: Full Family Communication: None at bedside Disposition Plan: Continue oral vancomycin for C. difficile infection with Colazol increased today per GI.  Noted to be hypokalemic and will supplement orally today and follow labs.   Consultants:   GI  Procedures:   None  Antimicrobials:  Anti-infectives (From admission, onward)   Start     Dose/Rate Route Frequency Ordered Stop   05/22/19 1400  vancomycin (VANCOCIN) 50 mg/mL oral solution 125 mg     125 mg Oral 4 times daily 05/22/19 1233 06/01/19 1359   05/21/19 0800  ciprofloxacin (CIPRO) IVPB 400 mg  Status:  Discontinued     400 mg 200 mL/hr over 60 Minutes Intravenous Every 12 hours 05/21/19 0213 05/22/19 1233   05/21/19 0400  metroNIDAZOLE (FLAGYL) IVPB 500 mg  Status:  Discontinued     500 mg 100 mL/hr over 60 Minutes Intravenous Every 8 hours 05/21/19 0213 05/22/19 1233   05/20/19 2000  ciprofloxacin (CIPRO) tablet 500 mg  Status:  Discontinued     500 mg Oral  Once 05/20/19 1951 05/20/19 1952   05/20/19 2000  metroNIDAZOLE (FLAGYL) tablet 500 mg  Status:  Discontinued     500 mg Oral  Once 05/20/19 1951 05/20/19 1952   05/20/19 2000  ciprofloxacin (CIPRO) IVPB 400 mg     400 mg 200 mL/hr over 60 Minutes Intravenous  Once 05/20/19 1952 05/20/19 2147   05/20/19 2000  metroNIDAZOLE (FLAGYL) IVPB 500 mg     500 mg 100 mL/hr over  60 Minutes Intravenous  Once 05/20/19 1952 05/20/19 2148       Subjective: Patient seen and evaluated today with no new acute complaints or concerns.  She denies any abdominal pain or cramping.  She denies nausea or vomiting, but still has poor appetite and would  not like to advance her diet as of yet.  She did have 5 bowel movements overnight with some bleeding noted.  Objective: Vitals:   05/24/19 2216 05/24/19 2217 05/25/19 0651 05/25/19 0722  BP: 114/71  (!) 119/54   Pulse: 84 68 74   Resp: 18  18   Temp: 98.9 F (37.2 C)     TempSrc: Oral  Oral   SpO2: (!) 83% 100% 95% 97%  Weight:      Height:        Intake/Output Summary (Last 24 hours) at 05/25/2019 1212 Last data filed at 05/25/2019 0327 Gross per 24 hour  Intake 2589.11 ml  Output -  Net 2589.11 ml   Filed Weights   05/20/19 1506  Weight: 90.7 kg    Examination:  General exam: Appears calm and comfortable, obese Respiratory system: Clear to auscultation. Respiratory effort normal.  Currently on room air Cardiovascular system: S1 & S2 heard, RRR. No JVD, murmurs, rubs, gallops or clicks. No pedal edema. Gastrointestinal system: Abdomen is nondistended, soft and nontender. No organomegaly or masses felt. Normal bowel sounds heard. Central nervous system: Alert and oriented. No focal neurological deficits. Extremities: Symmetric 5 x 5 power. Skin: No rashes, lesions or ulcers Psychiatry: Judgement and insight appear normal. Mood & affect appropriate.     Data Reviewed: I have personally reviewed following labs and imaging studies  CBC: Recent Labs  Lab 05/20/19 1604 05/21/19 0608 05/22/19 0642 05/24/19 0600  WBC 13.4* 10.9* 8.7 8.3  NEUTROABS 9.5*  --   --   --   HGB 12.8 11.8* 11.1* 11.3*  HCT 39.8 37.6 35.2* 36.0  MCV 93.9 93.8 94.4 94.5  PLT 305 233 292 539   Basic Metabolic Panel: Recent Labs  Lab 05/21/19 0608 05/22/19 0642 05/23/19 0952 05/24/19 0600 05/25/19 0618  NA 137 138 139 137 139  K 3.4* 2.8* 3.4* 3.3* 2.9*  CL 105 108 109 108 109  CO2 24 20* 22 23 22   GLUCOSE 125* 126* 118* 102* 93  BUN 6* <5* <5* <5* <5*  CREATININE 0.79 0.75 0.82 0.77 0.65  CALCIUM 8.4* 8.1* 7.9* 8.0* 7.8*  MG  --   --   --  2.1  --    GFR: Estimated Creatinine  Clearance: 72.4 mL/min (by C-G formula based on SCr of 0.65 mg/dL). Liver Function Tests: Recent Labs  Lab 05/20/19 1604 05/21/19 0608  AST 13* 14*  ALT 13 12  ALKPHOS 88 79  BILITOT 0.7 0.6  PROT 7.2 6.6  ALBUMIN 3.5 3.2*   Recent Labs  Lab 05/20/19 1604  LIPASE 16   No results for input(s): AMMONIA in the last 168 hours. Coagulation Profile: No results for input(s): INR, PROTIME in the last 168 hours. Cardiac Enzymes: No results for input(s): CKTOTAL, CKMB, CKMBINDEX, TROPONINI in the last 168 hours. BNP (last 3 results) No results for input(s): PROBNP in the last 8760 hours. HbA1C: No results for input(s): HGBA1C in the last 72 hours. CBG: No results for input(s): GLUCAP in the last 168 hours. Lipid Profile: No results for input(s): CHOL, HDL, LDLCALC, TRIG, CHOLHDL, LDLDIRECT in the last 72 hours. Thyroid Function Tests: No results for input(s): TSH, T4TOTAL, FREET4,  T3FREE, THYROIDAB in the last 72 hours. Anemia Panel: No results for input(s): VITAMINB12, FOLATE, FERRITIN, TIBC, IRON, RETICCTPCT in the last 72 hours. Sepsis Labs: No results for input(s): PROCALCITON, LATICACIDVEN in the last 168 hours.  Recent Results (from the past 240 hour(s))  Urine culture     Status: None   Collection Time: 05/20/19  9:03 PM   Specimen: Urine, Clean Catch  Result Value Ref Range Status   Specimen Description   Final    URINE, CLEAN CATCH Performed at [facility], [address], [place], [place] [address]    Special Requests   Final    NONE Performed at [facility], [address], [place], [place] [address]    Culture   Final    Multiple bacterial morphotypes present, none predominant. Suggest appropriate recollection if clinically indicated.   Report Status 05/22/2019 FINAL  Final  SARS CORONAVIRUS 2 Nasal Swab Aptima Multi Swab     Status: None   Collection Time: 05/20/19  9:20 PM   Specimen: Aptima Multi Swab; Nasal Swab  Result Value Ref Range Status   SARS  Coronavirus 2 NEGATIVE NEGATIVE Final    Comment: (NOTE) SARS-CoV-2 target nucleic acids are NOT DETECTED. The SARS-CoV-2 RNA is generally detectable in upper and lower respiratory specimens during the acute phase of infection. Negative results do not preclude SARS-CoV-2 infection, do not rule out co-infections with other pathogens, and should not be used as the sole basis for treatment or other patient management decisions. Negative results must be combined with clinical observations, patient history, and epidemiological information. The expected result is Negative. Fact Sheet for Patients: SugarRoll.be Fact Sheet for Healthcare Providers: https://www.woods-mathews.com/ This test is not yet approved or cleared by the [place] FDA and  has been authorized for detection and/or diagnosis of SARS-CoV-2 by FDA under an Emergency Use Authorization (EUA). This EUA will remain  in effect (meaning this test can be used) for the duration of the COVID-19 declaration under Section 56 4(b)(1) of the Act, 21 U.S.C. section 360bbb-3(b)(1), unless the authorization is terminated or revoked sooner. Performed at [facility], [place] [address] [place], [place] [address]   SARS Coronavirus 2 [facility] order, Performed in [facility] hospital lab) Nasopharyngeal Nasopharyngeal Swab     Status: None   Collection Time: 05/21/19  8:16 AM   Specimen: Nasopharyngeal Swab  Result Value Ref Range Status   SARS Coronavirus 2 NEGATIVE NEGATIVE Final    Comment: (NOTE) If result is NEGATIVE SARS-CoV-2 target nucleic acids are NOT DETECTED. The SARS-CoV-2 RNA is generally detectable in upper and lower  respiratory specimens during the acute phase of infection. The lowest  concentration of SARS-CoV-2 viral copies this assay can detect is 250  copies / mL. A negative result does not preclude SARS-CoV-2 infection  and should not be used as the sole basis for  treatment or other  patient management decisions.  A negative result may occur with  improper specimen collection / handling, submission of specimen other  than nasopharyngeal swab, presence of viral mutation(s) within the  areas targeted by this assay, and inadequate number of viral copies  (<250 copies / mL). A negative result must be combined with clinical  observations, patient history, and epidemiological information. If result is POSITIVE SARS-CoV-2 target nucleic acids are DETECTED. The SARS-CoV-2 RNA is generally detectable in upper and lower  respiratory specimens dur ing the acute phase of infection.  Positive  results are indicative of active infection with SARS-CoV-2.  Clinical  correlation with patient history and other diagnostic information is  necessary to determine patient infection status.  Positive results do  not rule out bacterial infection or co-infection with other viruses. If result is PRESUMPTIVE POSTIVE SARS-CoV-2 nucleic acids MAY BE PRESENT.   A presumptive positive result was obtained on the submitted specimen  and confirmed on repeat testing.  While 2019 novel coronavirus  (SARS-CoV-2) nucleic acids may be present in the submitted sample  additional confirmatory testing may be necessary for epidemiological  and / or clinical management purposes  to differentiate between  SARS-CoV-2 and other Sarbecovirus currently known to infect humans.  If clinically indicated additional testing with an alternate test  methodology [phone]) is advised. The SARS-CoV-2 RNA is generally  detectable in upper and lower respiratory sp ecimens during the acute  phase of infection. The expected result is Negative. Fact Sheet for Patients:  StrictlyIdeas.no Fact Sheet for Healthcare Providers: BankingDealers.co.za This test is not yet approved or cleared by the [place] FDA and has been authorized for detection and/or  diagnosis of SARS-CoV-2 by FDA under an Emergency Use Authorization (EUA).  This EUA will remain in effect (meaning this test can be used) for the duration of the COVID-19 declaration under Section 564(b)(1) of the Act, 21 U.S.C. section 360bbb-3(b)(1), unless the authorization is terminated or revoked sooner. Performed at [facility], [address] [place], [place] [address]   Gastrointestinal Panel by PCR , Stool     Status: None   Collection Time: 05/22/19 10:30 AM   Specimen: Stool  Result Value Ref Range Status   Campylobacter species NOT DETECTED NOT DETECTED Final   Plesimonas shigelloides NOT DETECTED NOT DETECTED Final   Salmonella species NOT DETECTED NOT DETECTED Final   Yersinia enterocolitica NOT DETECTED NOT DETECTED Final   Vibrio species NOT DETECTED NOT DETECTED Final   Vibrio cholerae NOT DETECTED NOT DETECTED Final   Enteroaggregative E coli (EAEC) NOT DETECTED NOT DETECTED Final   Enteropathogenic E coli (EPEC) NOT DETECTED NOT DETECTED Final   Enterotoxigenic E coli (ETEC) NOT DETECTED NOT DETECTED Final   Shiga like toxin producing E coli (STEC) NOT DETECTED NOT DETECTED Final   Shigella/Enteroinvasive E coli (EIEC) NOT DETECTED NOT DETECTED Final   Cryptosporidium NOT DETECTED NOT DETECTED Final   Cyclospora cayetanensis NOT DETECTED NOT DETECTED Final   Entamoeba histolytica NOT DETECTED NOT DETECTED Final   Giardia lamblia NOT DETECTED NOT DETECTED Final   Adenovirus F40/41 NOT DETECTED NOT DETECTED Final   Astrovirus NOT DETECTED NOT DETECTED Final   Norovirus GI/GII NOT DETECTED NOT DETECTED Final   Rotavirus A NOT DETECTED NOT DETECTED Final   Sapovirus (I, II, IV, and V) NOT DETECTED NOT DETECTED Final    Comment: Performed at [facility], [address] [place], [place] [address]  C difficile quick scan w PCR reflex     Status: Abnormal   Collection Time: 05/22/19 10:30 AM   Specimen: Stool  Result Value Ref Range Status   C Diff  antigen POSITIVE (A) NEGATIVE Final   C Diff toxin POSITIVE (A) NEGATIVE Final   C Diff interpretation Toxin producing C. difficile detected.  Final    Comment: CRITICAL RESULT CALLED TO, READ BACK BY AND VERIFIED WITH:  M.TAYLOR @ 0932 ON 8.23.20 BY LBB Performed at [facility], [address], [place], [place] [address]          Radiology Studies: No results found.      Scheduled Meds: .  apixaban  5 mg Oral BID  . balsalazide  2,250 mg Oral TID  . buPROPion  150 mg Oral Daily  . busPIRone  7.5 mg Oral BID  . DULoxetine  120 mg Oral Daily  . famotidine  20 mg Oral Daily  . hydrocortisone   Rectal BID  . ketotifen  1 drop Both Eyes Daily  . loratadine  10 mg Oral Daily  . metoprolol tartrate  12.5 mg Oral BID  . mometasone-formoterol  2 puff Inhalation BID  . pantoprazole  40 mg Oral Daily  . potassium chloride  40 mEq Oral TID  . pravastatin  20 mg Oral Daily  . pregabalin  100 mg Oral BID  . traZODone  100 mg Oral QHS  . vancomycin  125 mg Oral QID   Continuous Infusions: . sodium chloride 50 mL/hr at 05/24/19 0300     LOS: 4 days    Time spent: 30 minutes    Melanie Openshaw Darleen Crocker, DO [facility] Pager [phone]  If 7PM-7AM, please contact night-coverage [email or web address] Password [email or web address] 05/25/2019, 12:12 PM

## 2019-05-26 LAB — CBC
HCT: 34.7 % — ABNORMAL LOW (ref 36.0–46.0)
Hemoglobin: 11.9 g/dL — ABNORMAL LOW (ref 12.0–15.0)
MCH: 30.2 pg (ref 26.0–34.0)
MCHC: 34.3 g/dL (ref 30.0–36.0)
MCV: 88.1 fL (ref 80.0–100.0)
Platelets: 222 10*3/uL (ref 150–400)
RBC: 3.94 MIL/uL (ref 3.87–5.11)
RDW: 15 % (ref 11.5–15.5)
WBC: 7.8 10*3/uL (ref 4.0–10.5)
nRBC: 0 % (ref 0.0–0.2)

## 2019-05-26 LAB — BASIC METABOLIC PANEL
Anion gap: 6 (ref 5–15)
BUN: <5 mg/dL — ABNORMAL LOW (ref 8–23)
CO2: 22 mmol/L (ref 22–32)
Calcium: 8.5 mg/dL — ABNORMAL LOW (ref 8.9–10.3)
Chloride: 111 mmol/L (ref 98–111)
Creatinine, Ser: 0.78 mg/dL (ref 0.44–1.00)
GFR calc Af Amer: >60 mL/min (ref >60)
GFR calc non Af Amer: >60 mL/min (ref >60)
Glucose, Bld: 164 mg/dL — ABNORMAL HIGH (ref 70–99)
Potassium: 4.1 mmol/L (ref 3.5–5.1)
Sodium: 139 mmol/L (ref 135–145)

## 2019-05-26 LAB — MAGNESIUM: Magnesium: 1.7 mg/dL (ref 1.7–2.4)

## 2019-05-26 NOTE — Progress Notes (Signed)
PROGRESS NOTE    [name]  Bean:[id number] DOB: 1951-06-15 DOA: 05/20/2019 PCP: Jalene Mullet, PA-C   Brief Narrative:  68 y.o.female,with history of ulcerative colitis recent diagnosis of ulcerative pancolitis in June 2020, followed by GI as outpatient, atrial fibrillation on anticoagulation with Eliquis, COPD came to hospital with complaints of abdominal pain and diarrhea. Patient says that she had multiple loose bowel movements. Also generalized abdominal pain. CT scan of the abdomen pelvis done in the ED shows diffuse pancolitis infectious versus inflammatory. She was recently seen by gastroenterology as outpatient. Denies vomiting but complains of nausea Denies chest pain or shortness of breath. Complains of fever,but has been afebrile in the hospital. Lab work showed WBC 13.4 Patient empirically started on Cipro and Flagyl.  8/27: She has since been transitioned to vancomycin after discovery of C. difficile pancolitis and is currently day 5 of 14.  She continues to have ongoing loose bowel movements.   Assessment & Plan:   Active Problems:   Colitis   Enteritis due to Clostridium difficile  C. difficile pancolitis-persistent -IV ciprofloxacin and Flagyl has been discontinued; patient started on vancomycin 125 mg every 6 hours and will continue treatment for a total of 14 days.Currently day 5 /14. -Given the still ongoing diarrhea following GI recommendations with increase in Colazol dose today -Patient educated and encouraged to follow a lactose-free diet. -Continue supportive care. -Anusol cream has been added to assist with rectal discomfort from multiple diarrhea events.  Hypokalemia-resolved -Secondary to ongoing bowel movements -Recheck in a.m. -DC telemetry today  history of ulcerative colitis  -Continue Colazal at higher dose per GI -Steroids contraindicated in the presence of acute infection -Follow GI service recommendations and supportive care.   chronic atrial fibrillation -Rate controlled -Continue metoprolol and Eliquis -No need for telemetry  COPD -Stable and compensated -Continue PRN albuterol -Continue Dulera  depression/anxiety -No suicidal ideation or hallucination -Continue home psychotropic medications.   DVT prophylaxis: Eliquis Code Status: Full Family Communication: None at bedside Disposition Plan: Continue oral vancomycin for C. difficile infection with Colazol increased 8/26 per GI.    Hypokalemia resolved with supplementation.  Continue to follow labs and symptoms.   Consultants:   GI  Procedures:   None  Antimicrobials:  Anti-infectives (From admission, onward)   Start     Dose/Rate Route Frequency Ordered Stop   05/22/19 1400  vancomycin (VANCOCIN) 50 mg/mL oral solution 125 mg     125 mg Oral 4 times daily 05/22/19 1233 06/01/19 1359   05/21/19 0800  ciprofloxacin (CIPRO) IVPB 400 mg  Status:  Discontinued     400 mg 200 mL/hr over 60 Minutes Intravenous Every 12 hours 05/21/19 0213 05/22/19 1233   05/21/19 0400  metroNIDAZOLE (FLAGYL) IVPB 500 mg  Status:  Discontinued     500 mg 100 mL/hr over 60 Minutes Intravenous Every 8 hours 05/21/19 0213 05/22/19 1233   05/20/19 2000  ciprofloxacin (CIPRO) tablet 500 mg  Status:  Discontinued     500 mg Oral  Once 05/20/19 1951 05/20/19 1952   05/20/19 2000  metroNIDAZOLE (FLAGYL) tablet 500 mg  Status:  Discontinued     500 mg Oral  Once 05/20/19 1951 05/20/19 1952   05/20/19 2000  ciprofloxacin (CIPRO) IVPB 400 mg     400 mg 200 mL/hr over 60 Minutes Intravenous  Once 05/20/19 1952 05/20/19 2147   05/20/19 2000  metroNIDAZOLE (FLAGYL) IVPB 500 mg     500 mg 100 mL/hr over 60 Minutes Intravenous  Once 05/20/19 1952 05/20/19 2148       Subjective: Patient seen and evaluated today with no new acute complaints or concerns. No acute concerns or events noted overnight.  She has minimal abdominal cramping noted, but still with 5-7 bowel  movements noted overnight that are not formed.  Objective: Vitals:   05/25/19 2023 05/25/19 2158 05/26/19 0531 05/26/19 0752  BP:  121/71 137/84   Pulse:  72 64   Resp:  18 20   Temp:  98.9 F (37.2 C) 98.5 F (36.9 C)   TempSrc:  Oral Oral   SpO2: 98% 97% 97% 96%  Weight:      Height:        Intake/Output Summary (Last 24 hours) at 05/26/2019 1338 Last data filed at 05/26/2019 0300 Gross per 24 hour  Intake 625 ml  Output -  Net 625 ml   Filed Weights   05/20/19 1506  Weight: 90.7 kg    Examination:  General exam: Appears calm and comfortable  Respiratory system: Clear to auscultation. Respiratory effort normal. Cardiovascular system: S1 & S2 heard, RRR. No JVD, murmurs, rubs, gallops or clicks. No pedal edema. Gastrointestinal system: Abdomen is nondistended, soft and nontender. No organomegaly or masses felt. Normal bowel sounds heard. Central nervous system: Alert and oriented. No focal neurological deficits. Extremities: Symmetric 5 x 5 power. Skin: No rashes, lesions or ulcers Psychiatry: Judgement and insight appear normal. Mood & affect appropriate.     Data Reviewed: I have personally reviewed following labs and imaging studies  CBC: Recent Labs  Lab 05/20/19 1604 05/21/19 0608 05/22/19 0642 05/24/19 0600 05/26/19 1104  WBC 13.4* 10.9* 8.7 8.3 7.8  NEUTROABS 9.5*  --   --   --   --   HGB 12.8 11.8* 11.1* 11.3* 11.9*  HCT 39.8 37.6 35.2* 36.0 34.7*  MCV 93.9 93.8 94.4 94.5 88.1  PLT 305 233 292 212 335   Basic Metabolic Panel: Recent Labs  Lab 05/22/19 0642 05/23/19 0952 05/24/19 0600 05/25/19 0618 05/26/19 1104  NA 138 139 137 139 139  K 2.8* 3.4* 3.3* 2.9* 4.1  CL 108 109 108 109 111  CO2 20* 22 23 22 22   GLUCOSE 126* 118* 102* 93 164*  BUN <5* <5* <5* <5* <5*  CREATININE 0.75 0.82 0.77 0.65 0.78  CALCIUM 8.1* 7.9* 8.0* 7.8* 8.5*  MG  --   --  2.1  --   --    GFR: Estimated Creatinine Clearance: 72.4 mL/min (by C-G formula based on  SCr of 0.78 mg/dL). Liver Function Tests: Recent Labs  Lab 05/20/19 1604 05/21/19 0608  AST 13* 14*  ALT 13 12  ALKPHOS 88 79  BILITOT 0.7 0.6  PROT 7.2 6.6  ALBUMIN 3.5 3.2*   Recent Labs  Lab 05/20/19 1604  LIPASE 16   No results for input(s): AMMONIA in the last 168 hours. Coagulation Profile: No results for input(s): INR, PROTIME in the last 168 hours. Cardiac Enzymes: No results for input(s): CKTOTAL, CKMB, CKMBINDEX, TROPONINI in the last 168 hours. BNP (last 3 results) No results for input(s): PROBNP in the last 8760 hours. HbA1C: No results for input(s): HGBA1C in the last 72 hours. CBG: No results for input(s): GLUCAP in the last 168 hours. Lipid Profile: No results for input(s): CHOL, HDL, LDLCALC, TRIG, CHOLHDL, LDLDIRECT in the last 72 hours. Thyroid Function Tests: No results for input(s): TSH, T4TOTAL, FREET4, T3FREE, THYROIDAB in the last 72 hours. Anemia Panel: No results for input(s): VITAMINB12,  FOLATE, FERRITIN, TIBC, IRON, RETICCTPCT in the last 72 hours. Sepsis Labs: No results for input(s): PROCALCITON, LATICACIDVEN in the last 168 hours.  Recent Results (from the past 240 hour(s))  Urine culture     Status: None   Collection Time: 05/20/19  9:03 PM   Specimen: Urine, Clean Catch  Result Value Ref Range Status   Specimen Description   Final    URINE, CLEAN CATCH Performed at [facility], [address] [place] [address] [place], [place] [address]    Special Requests   Final    NONE Performed at [facility], [address] [place], [place] [address]    Culture   Final    Multiple bacterial morphotypes present, none predominant. Suggest appropriate recollection if clinically indicated.   Report Status 05/22/2019 FINAL  Final  SARS CORONAVIRUS 2 Nasal Swab Aptima Multi Swab     Status: None   Collection Time: 05/20/19  9:20 PM   Specimen: Aptima Multi Swab; Nasal Swab  Result Value Ref Range Status   SARS Coronavirus 2 NEGATIVE NEGATIVE Final    Comment:  (NOTE) SARS-CoV-2 target nucleic acids are NOT DETECTED. The SARS-CoV-2 RNA is generally detectable in upper and lower respiratory specimens during the acute phase of infection. Negative results do not preclude SARS-CoV-2 infection, do not rule out co-infections with other pathogens, and should not be used as the sole basis for treatment or other patient management decisions. Negative results must be combined with clinical observations, patient history, and epidemiological information. The expected result is Negative. Fact Sheet for Patients: SugarRoll.be Fact Sheet for Healthcare Providers: https://www.woods-mathews.com/ This test is not yet approved or cleared by the [place] FDA and  has been authorized for detection and/or diagnosis of SARS-CoV-2 by FDA under an Emergency Use Authorization (EUA). This EUA will remain  in effect (meaning this test can be used) for the duration of the COVID-19 declaration under Section 56 4(b)(1) of the Act, 21 U.S.C. section 360bbb-3(b)(1), unless the authorization is terminated or revoked sooner. Performed at [facility], [place] [address] [place], [place] [address]   SARS Coronavirus 2 [facility] order, Performed in [facility] hospital lab) Nasopharyngeal Nasopharyngeal Swab     Status: None   Collection Time: 05/21/19  8:16 AM   Specimen: Nasopharyngeal Swab  Result Value Ref Range Status   SARS Coronavirus 2 NEGATIVE NEGATIVE Final    Comment: (NOTE) If result is NEGATIVE SARS-CoV-2 target nucleic acids are NOT DETECTED. The SARS-CoV-2 RNA is generally detectable in upper and lower  respiratory specimens during the acute phase of infection. The lowest  concentration of SARS-CoV-2 viral copies this assay can detect is 250  copies / mL. A negative result does not preclude SARS-CoV-2 infection  and should not be used as the sole basis for treatment or other  patient management decisions.  A  negative result may occur with  improper specimen collection / handling, submission of specimen other  than nasopharyngeal swab, presence of viral mutation(s) within the  areas targeted by this assay, and inadequate number of viral copies  (<250 copies / mL). A negative result must be combined with clinical  observations, patient history, and epidemiological information. If result is POSITIVE SARS-CoV-2 target nucleic acids are DETECTED. The SARS-CoV-2 RNA is generally detectable in upper and lower  respiratory specimens dur ing the acute phase of infection.  Positive  results are indicative of active infection with SARS-CoV-2.  Clinical  correlation with patient history and other diagnostic information is  necessary  to determine patient infection status.  Positive results do  not rule out bacterial infection or co-infection with other viruses. If result is PRESUMPTIVE POSTIVE SARS-CoV-2 nucleic acids MAY BE PRESENT.   A presumptive positive result was obtained on the submitted specimen  and confirmed on repeat testing.  While 2019 novel coronavirus  (SARS-CoV-2) nucleic acids may be present in the submitted sample  additional confirmatory testing may be necessary for epidemiological  and / or clinical management purposes  to differentiate between  SARS-CoV-2 and other Sarbecovirus currently known to infect humans.  If clinically indicated additional testing with an alternate test  methodology [phone]) is advised. The SARS-CoV-2 RNA is generally  detectable in upper and lower respiratory sp ecimens during the acute  phase of infection. The expected result is Negative. Fact Sheet for Patients:  StrictlyIdeas.no Fact Sheet for Healthcare Providers: BankingDealers.co.za This test is not yet approved or cleared by the [place] FDA and has been authorized for detection and/or diagnosis of SARS-CoV-2 by FDA under an Emergency Use  Authorization (EUA).  This EUA will remain in effect (meaning this test can be used) for the duration of the COVID-19 declaration under Section 564(b)(1) of the Act, 21 U.S.C. section 360bbb-3(b)(1), unless the authorization is terminated or revoked sooner. Performed at [facility], [address] [place], [place] [address]   Gastrointestinal Panel by PCR , Stool     Status: None   Collection Time: 05/22/19 10:30 AM   Specimen: Stool  Result Value Ref Range Status   Campylobacter species NOT DETECTED NOT DETECTED Final   Plesimonas shigelloides NOT DETECTED NOT DETECTED Final   Salmonella species NOT DETECTED NOT DETECTED Final   Yersinia enterocolitica NOT DETECTED NOT DETECTED Final   Vibrio species NOT DETECTED NOT DETECTED Final   Vibrio cholerae NOT DETECTED NOT DETECTED Final   Enteroaggregative E coli (EAEC) NOT DETECTED NOT DETECTED Final   Enteropathogenic E coli (EPEC) NOT DETECTED NOT DETECTED Final   Enterotoxigenic E coli (ETEC) NOT DETECTED NOT DETECTED Final   Shiga like toxin producing E coli (STEC) NOT DETECTED NOT DETECTED Final   Shigella/Enteroinvasive E coli (EIEC) NOT DETECTED NOT DETECTED Final   Cryptosporidium NOT DETECTED NOT DETECTED Final   Cyclospora cayetanensis NOT DETECTED NOT DETECTED Final   Entamoeba histolytica NOT DETECTED NOT DETECTED Final   Giardia lamblia NOT DETECTED NOT DETECTED Final   Adenovirus F40/41 NOT DETECTED NOT DETECTED Final   Astrovirus NOT DETECTED NOT DETECTED Final   Norovirus GI/GII NOT DETECTED NOT DETECTED Final   Rotavirus A NOT DETECTED NOT DETECTED Final   Sapovirus (I, II, IV, and V) NOT DETECTED NOT DETECTED Final    Comment: Performed at [facility], [address] [place], [place] [address]  C difficile quick scan w PCR reflex     Status: Abnormal   Collection Time: 05/22/19 10:30 AM   Specimen: Stool  Result Value Ref Range Status   C Diff antigen POSITIVE (A) NEGATIVE Final   C Diff toxin POSITIVE  (A) NEGATIVE Final   C Diff interpretation Toxin producing C. difficile detected.  Final    Comment: CRITICAL RESULT CALLED TO, READ BACK BY AND VERIFIED WITH:  M.TAYLOR @ 2725 ON 8.23.20 BY LBB Performed at [facility], [address], [place], [place] [address]          Radiology Studies: No results found.      Scheduled Meds: . apixaban  5 mg Oral BID  . balsalazide  2,250 mg  Oral TID  . buPROPion  150 mg Oral Daily  . busPIRone  7.5 mg Oral BID  . DULoxetine  120 mg Oral Daily  . famotidine  20 mg Oral BID AC  . hydrocortisone   Rectal BID  . ketotifen  1 drop Both Eyes Daily  . loratadine  10 mg Oral Daily  . metoprolol tartrate  12.5 mg Oral BID  . mometasone-formoterol  2 puff Inhalation BID  . pravastatin  20 mg Oral Daily  . pregabalin  100 mg Oral BID  . traZODone  100 mg Oral QHS  . vancomycin  125 mg Oral QID   Continuous Infusions:   LOS: 5 days    Time spent: 30 minutes    Paula Napolitano Darleen Crocker, DO [facility] Pager [phone]  If 7PM-7AM, please contact night-coverage [email or web address] Password [email or web address] 05/26/2019, 1:38 PM

## 2019-05-26 NOTE — Progress Notes (Signed)
Subjective:  Denies any improvement.  Continues to have abdominal pain.  5-7 bowel movements overnight.  This last 1, smaller volume.  Small amount of blood.  Watery.  Episode of incontinence. Complains of pp diarrhea. At her best baseline, she has couple of soft stools daily.   Objective: Vital signs in last 24 hours: Temp:  [98.5 F (36.9 C)-98.9 F (37.2 C)] 98.5 F (36.9 C) (08/27 0531) Pulse Rate:  [64-72] 64 (08/27 0531) Resp:  [18-20] 20 (08/27 0531) BP: (121-137)/(71-84) 137/84 (08/27 0531) SpO2:  [96 %-98 %] 96 % (08/27 0752) Last BM Date: 05/23/19 General:   Alert,  Well-developed, well-nourished, pleasant and cooperative in NAD Head:  Normocephalic and atraumatic. Eyes:  Sclera clear, no icterus.  Abdomen:  Soft, nontender and nondistended.  Normal bowel sounds, without guarding, and without rebound.   Extremities:  Without clubbing, deformity or edema. Neurologic:  Alert and  oriented x4;  grossly normal neurologically. Skin:  Intact without significant lesions or rashes. Psych:  Alert and cooperative. Normal mood and affect.  Intake/Output from previous day: 08/26 0701 - 08/27 0700 In: 625 [I.V.:625] Out: -  Intake/Output this shift: No intake/output data recorded.  Lab Results: CBC Recent Labs    05/24/19 0600  WBC 8.3  HGB 11.3*  HCT 36.0  MCV 94.5  PLT 212   BMET Recent Labs    05/23/19 0952 05/24/19 0600 05/25/19 0618  NA 139 137 139  K 3.4* 3.3* 2.9*  CL 109 108 109  CO2 22 23 22   GLUCOSE 118* 102* 93  BUN <5* <5* <5*  CREATININE 0.82 0.77 0.65  CALCIUM 7.9* 8.0* 7.8*   LFTs No results for input(s): BILITOT, BILIDIR, IBILI, ALKPHOS, AST, ALT, PROT, ALBUMIN in the last 72 hours. No results for input(s): LIPASE in the last 72 hours. PT/INR No results for input(s): LABPROT, INR in the last 72 hours.    Imaging Studies: Dg Chest 2 View  Result Date: 05/20/2019 CLINICAL DATA:  Abdominal cramping with nausea fever. EXAM: CHEST - 2 VIEW  COMPARISON:  02/26/2018 FINDINGS: Cardiopericardial silhouette is at upper limits of normal for size. Hiatal hernia noted. The lungs are clear without focal pneumonia, edema, pneumothorax or pleural effusion. Interstitial markings are diffusely coarsened with chronic features. The visualized bony structures of the thorax are intact. IMPRESSION: 1. No acute cardiopulmonary findings 2. Hiatal hernia. Electronically Signed   By: Misty Stanley M.D.   On: 05/20/2019 19:10   Ct Abdomen Pelvis W Contrast  Result Date: 05/20/2019 CLINICAL DATA:  Abdominal pain with diarrhea. EXAM: CT ABDOMEN AND PELVIS WITH CONTRAST TECHNIQUE: Multidetector CT imaging of the abdomen and pelvis was performed using the standard protocol following bolus administration of intravenous contrast. CONTRAST:  163m OMNIPAQUE IOHEXOL 300 MG/ML  SOLN COMPARISON:  05/01/2016 FINDINGS: Lower chest:  Large hiatal hernia. Hepatobiliary: No suspicious focal abnormality within the liver parenchyma. Gallbladder is surgically absent. No intrahepatic or extrahepatic biliary dilation. Pancreas: No focal mass lesion. No dilatation of the main duct. No intraparenchymal cyst. No peripancreatic edema. Spleen: No splenomegaly. No focal mass lesion. Adrenals/Urinary Tract: No adrenal nodule or mass. 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 Right kidney unremarkable. The urinary bladder appears normal for the degree of distention. Stomach/Bowel: Large hiatal hernia with approximately 75-80% of the stomach in the chest. Duodenum is normally positioned as is the ligament of Treitz. No small bowel wall thickening. No small bowel dilatation. Cecum is positioned in the anterior  midline with unremarkable terminal ileum The appendix is not visualized, but there is no edema or inflammation in the region of the cecum. There is diffuse mild colonic wall thickening, most prominent in the sigmoid segment but extending  from the ascending colon to the level of the rectum. This is associated with pericolonic edema/inflammation. Diverticular changes are noted in the left colon. Vascular/Lymphatic: There is abdominal aortic atherosclerosis without aneurysm. Celiac axis, SMA, and IMA are opacified. Portal vein and superior mesenteric vein are patent. There is no gastrohepatic or hepatoduodenal ligament lymphadenopathy. No intraperitoneal or retroperitoneal lymphadenopathy. No pelvic sidewall lymphadenopathy. Reproductive: The uterus is unremarkable.  There is no adnexal mass. Other: No intraperitoneal free fluid. Musculoskeletal: No worrisome lytic or sclerotic osseous abnormality. IMPRESSION: 1. Diffuse mild colonic wall thickening with pericolonic edema/inflammation. Imaging features are compatible with an infectious/inflammatory colitis. 2. Large hiatal hernia. 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 4. Abdominal aortic atherosclerosis. Aortic Atherosclerosis (ICD10-I70.0). Electronically Signed   By: Misty Stanley M.D.   On: 05/20/2019 19:06  [2 weeks]   Assessment:  68 year old female with history of ulcerative pancolitis, presenting with C. difficile infection.  GI pathogen panel negative.  On oral vancomycin, day 5.  She has been supplying her on Colazal from home.  We increased her to 3 times daily to maximize dosing for possible flare of UC as well but she only received 2 doses yesterday for unclear reasons. Personally spoke with nursing staff Larene Beach) who has her today and aware of change.  Hypokalemia in the setting of diarrhea: Continue potassium management per attending.  Chronic GERD: Ideally would avoid PPI in the setting of C. difficile, but given her longstanding history of complicated GERD, would advise using lowest dosage and frequency for control of symptoms.  Plan: 1. Complete 14-day course of oral vancomycin. 2. Continue Colazal 2.25 g 3  times daily.  3. Continue to monitor for improvement, hopefully increased Colazal will be helpful. Difficult to sort out patient's diarrhea as she has underlying IBD and IBS.   Laureen Ochs. Bernarda Caffey [facility] [phone] 8/27/20209:41 AM     LOS: 5 days

## 2019-05-26 NOTE — Plan of Care (Signed)

## 2019-05-27 DIAGNOSIS — K51019 Ulcerative (chronic) pancolitis with unspecified complications: Secondary | ICD-10-CM

## 2019-05-27 DIAGNOSIS — R197 Diarrhea, unspecified: Secondary | ICD-10-CM

## 2019-05-27 LAB — BASIC METABOLIC PANEL
Anion gap: 7 (ref 5–15)
BUN: <5 mg/dL — ABNORMAL LOW (ref 8–23)
CO2: 27 mmol/L (ref 22–32)
Calcium: 9 mg/dL (ref 8.9–10.3)
Chloride: 107 mmol/L (ref 98–111)
Creatinine, Ser: 0.82 mg/dL (ref 0.44–1.00)
GFR calc Af Amer: >60 mL/min (ref >60)
GFR calc non Af Amer: >60 mL/min (ref >60)
Glucose, Bld: 137 mg/dL — ABNORMAL HIGH (ref 70–99)
Potassium: 3.9 mmol/L (ref 3.5–5.1)
Sodium: 141 mmol/L (ref 135–145)

## 2019-05-27 LAB — CBC
HCT: 36.9 % (ref 36.0–46.0)
Hemoglobin: 11.7 g/dL — ABNORMAL LOW (ref 12.0–15.0)
MCH: 29.8 pg (ref 26.0–34.0)
MCHC: 31.7 g/dL (ref 30.0–36.0)
MCV: 93.9 fL (ref 80.0–100.0)
Platelets: 240 10*3/uL (ref 150–400)
RBC: 3.93 MIL/uL (ref 3.87–5.11)
RDW: 15.5 % (ref 11.5–15.5)
WBC: 8.9 10*3/uL (ref 4.0–10.5)
nRBC: 0 % (ref 0.0–0.2)

## 2019-05-27 MED ORDER — DICYCLOMINE HCL 10 MG PO CAPS
10.0000 mg | ORAL_CAPSULE | Freq: Three times a day (TID) | ORAL | Status: DC
  Administered 2019-05-27 – 2019-05-30 (×8): 10 mg via ORAL
  Filled 2019-05-27 (×8): qty 1

## 2019-05-27 NOTE — Care Management Important Message (Signed)
Important Message  Patient Details  Name: [name] MRN: [id number] Date of Birth: 1950-11-19   Medicare Important Message Given:  Yes     Tommy Medal 05/27/2019, 2:55 PM

## 2019-05-27 NOTE — Plan of Care (Signed)

## 2019-05-27 NOTE — Progress Notes (Signed)
    Subjective: Feeling somewhat better this morning. Abdominal pain improved. Had 6 stools overnight, seems to becoming more "like paste". Denies N/V. Did see some blood in her stools this morning, difficult for her to tell how much. Denes symptoms of anemia. No other GI complaints.  Objective: Vital signs in last 24 hours: Temp:  [98.7 F (37.1 C)] 98.7 F (37.1 C) (08/28 0514) Pulse Rate:  [64-68] 64 (08/28 0514) Resp:  [20] 20 (08/28 0514) BP: (118-140)/(75-85) 118/75 (08/28 0514) SpO2:  [92 %-98 %] 92 % (08/28 0514) Last BM Date: 05/26/19 General:   Alert and oriented, pleasant Head:  Normocephalic and atraumatic. Eyes:  No icterus, sclera clear. Conjuctiva pink.  Heart:  S1, S2 present, no murmurs noted.  Lungs: Clear to auscultation bilaterally, without wheezing, rales, or rhonchi.  Abdomen:  Bowel sounds present, soft, non-tender, non-distended. No HSM or hernias noted. No rebound or guarding. Msk:  Symmetrical without gross deformities. Pulses:  Normal bilateral DP pulses noted. Extremities:  Without clubbing or edema. Neurologic:  Alert and  oriented x4;  grossly normal neurologically. Psych:  Alert and cooperative. Normal mood and affect.  Intake/Output from previous day: No intake/output data recorded. Intake/Output this shift: No intake/output data recorded.  Lab Results: Recent Labs    05/26/19 1104  WBC 7.8  HGB 11.9*  HCT 34.7*  PLT 222   BMET Recent Labs    05/25/19 0618 05/26/19 1104  NA 139 139  K 2.9* 4.1  CL 109 111  CO2 22 22  GLUCOSE 93 164*  BUN <5* <5*  CREATININE 0.65 0.78  CALCIUM 7.8* 8.5*   LFT No results for input(s): PROT, ALBUMIN, AST, ALT, ALKPHOS, BILITOT, BILIDIR, IBILI in the last 72 hours. PT/INR No results for input(s): LABPROT, INR in the last 72 hours. Hepatitis Panel No results for input(s): HEPBSAG, HCVAB, HEPAIGM, HEPBIGM in the last 72 hours.   Studies/Results: No results found.  Assessment: 68 year old  female with history of ulcerative pancolitis, presenting with C. difficile infection.  GI pathogen panel negative.  On oral vancomycin, today being day 6.  She has been supplying her on Colazal from home.  We increased her to 3 times daily to maximize dosing for possible flare of UC as well but she only received 2 doses after ordered increase for unclear reasons. After discussion with nursing, she appears to have received full 3 doses yesterday. Difficult to discern primary cause of diarrhea with IBD, IBS, C. Diff. Likely multifactorial. GI MD visit yesterday evening indicates abdominal pain and stool frequency may be tapering off.  Today she feels somewhat improved. Abdominal pain better. Mild TTP on exam. Still with 6 stools overnight, trying to solidify.  Hypokalemia in the setting of diarrhea: Continue potassium management per attending.  Chronic GERD: Ideally would avoid PPI in the setting of C. difficile, but given her longstanding history of complicated GERD, may not be possible in the long term. No GERD complaints today on Pepcid.  Plan: 1. Continue Colazal tid 2. CBC in the morning 3. Continue Vancomycin for 14 days total duration 4. Electrolyte replacement per hospitalist. 5. Continue Pepcid 6. Supportive measures   Thank you for allowing [place] to participate in the care of Robards, DNP, AGNP-C Adult & Gerontological Nurse Practitioner [facility]     LOS: 6 days    05/27/2019, 8:50 AM

## 2019-05-27 NOTE — Progress Notes (Signed)
PROGRESS NOTE    Paula SHEAFFER  TIW:[id number] DOB: 07-03-1951 DOA: 05/20/2019 PCP: Jalene Mullet, PA-C   Brief Narrative:  68 y.o.female,with history of ulcerative colitis recent diagnosis of ulcerative pancolitis in June 2020, followed by GI as outpatient, atrial fibrillation on anticoagulation with Eliquis, COPD came to hospital with complaints of abdominal pain and diarrhea. Patient says that she had multiple loose bowel movements. Also generalized abdominal pain. CT scan of the abdomen pelvis done in the ED shows diffuse pancolitis infectious versus inflammatory. She was recently seen by gastroenterology as outpatient. Denies vomiting but complains of nausea Denies chest pain or shortness of breath. Complains of fever,but has been afebrile in the hospital. Lab work showed WBC 13.4 Patient empirically started on Cipro and Flagyl.  8/27: She has since been transitioned to vancomycin after discovery of C. difficile pancolitis and is currently day 5 of 14. She continues to have ongoing loose bowel movements.  8/28: Patient continues to have ongoing frequent bowel movements, but they appear to be slightly more formed.  She is currently on day 6 of 14 of vancomycin.  Bentyl restarted by GI today.  Assessment & Plan:   Active Problems:   Colitis   Enteritis due to Clostridium difficile   C. difficile pancolitis-persistent -IV ciprofloxacin and Flagyl has been discontinued; patient started on vancomycin 125 mg every 6 hours and will continue treatment for a total of 14 days.Currentlyday 6/14. -Given the still ongoing diarrhea following GI recommendationswith increase in Colazol dose today -Patient educated and encouraged to follow a lactose-free diet. -Continue supportive care. -Anusol cream has been added to assist with rectal discomfort from multiple diarrhea events. -Bentyl restarted by GI today  Hypokalemia-resolved -Secondary to ongoing bowel movements  history  of ulcerative colitis  -Continue Colazalat higher dose per GI -Steroids contraindicated in the presence of acute infection -Follow GI service recommendations and supportive care.  chronic atrial fibrillation -Rate controlled -Continue metoprolol and Eliquis -No need for telemetry  COPD -Stable and compensated -Continue PRN albuterol -Continue Dulera  depression/anxiety -No suicidal ideation or hallucination -Continue home psychotropic medications.   DVT prophylaxis:Eliquis Code Status:Full Family Communication:None at bedside Disposition Plan:Continue oral vancomycin for C. difficile infection withColazol increased 8/26 per GI.  Bentyl restarted on 8/20. Hypokalemia resolved with supplementation.   Consultants:  GI  Procedures:  None  Antimicrobials:  Anti-infectives (From admission, onward)   Start     Dose/Rate Route Frequency Ordered Stop   05/22/19 1400  vancomycin (VANCOCIN) 50 mg/mL oral solution 125 mg     125 mg Oral 4 times daily 05/22/19 1233 06/01/19 1359   05/21/19 0800  ciprofloxacin (CIPRO) IVPB 400 mg  Status:  Discontinued     400 mg 200 mL/hr over 60 Minutes Intravenous Every 12 hours 05/21/19 0213 05/22/19 1233   05/21/19 0400  metroNIDAZOLE (FLAGYL) IVPB 500 mg  Status:  Discontinued     500 mg 100 mL/hr over 60 Minutes Intravenous Every 8 hours 05/21/19 0213 05/22/19 1233   05/20/19 2000  ciprofloxacin (CIPRO) tablet 500 mg  Status:  Discontinued     500 mg Oral  Once 05/20/19 1951 05/20/19 1952   05/20/19 2000  metroNIDAZOLE (FLAGYL) tablet 500 mg  Status:  Discontinued     500 mg Oral  Once 05/20/19 1951 05/20/19 1952   05/20/19 2000  ciprofloxacin (CIPRO) IVPB 400 mg     400 mg 200 mL/hr over 60 Minutes Intravenous  Once 05/20/19 1952 05/20/19 2147   05/20/19 2000  metroNIDAZOLE (FLAGYL) IVPB 500 mg     500 mg 100 mL/hr over 60 Minutes Intravenous  Once 05/20/19 1952 05/20/19 2148       Subjective: Patient seen and  evaluated today with no significant abdominal pain noted this morning.  She continues to have approximately 5-6 bowel movements overnight that appear to be slightly more formed.  Objective: Vitals:   05/26/19 1957 05/26/19 2140 05/27/19 0514 05/27/19 0916  BP:  140/85 118/75   Pulse:  68 64   Resp:  20 20   Temp:  98.7 F (37.1 C) 98.7 F (37.1 C)   TempSrc:  Oral Oral   SpO2: 98% 97% 92% 98%  Weight:      Height:        Intake/Output Summary (Last 24 hours) at 05/27/2019 1455 Last data filed at 05/27/2019 1244 Gross per 24 hour  Intake 480 ml  Output -  Net 480 ml   Filed Weights   05/20/19 1506  Weight: 90.7 kg    Examination:  General exam: Appears calm and comfortable  Respiratory system: Clear to auscultation. Respiratory effort normal. Cardiovascular system: S1 & S2 heard, RRR. No JVD, murmurs, rubs, gallops or clicks. No pedal edema. Gastrointestinal system: Abdomen is nondistended, soft and nontender. No organomegaly or masses felt. Normal bowel sounds heard. Central nervous system: Alert and oriented. No focal neurological deficits. Extremities: Symmetric 5 x 5 power. Skin: No rashes, lesions or ulcers Psychiatry: Judgement and insight appear normal. Mood & affect appropriate.     Data Reviewed: I have personally reviewed following labs and imaging studies  CBC: Recent Labs  Lab 05/20/19 1604 05/21/19 0608 05/22/19 0642 05/24/19 0600 05/26/19 1104 05/27/19 0832  WBC 13.4* 10.9* 8.7 8.3 7.8 8.9  NEUTROABS 9.5*  --   --   --   --   --   HGB 12.8 11.8* 11.1* 11.3* 11.9* 11.7*  HCT 39.8 37.6 35.2* 36.0 34.7* 36.9  MCV 93.9 93.8 94.4 94.5 88.1 93.9  PLT 305 233 292 212 222 811   Basic Metabolic Panel: Recent Labs  Lab 05/23/19 0952 05/24/19 0600 05/25/19 0618 05/26/19 1104 05/26/19 1458 05/27/19 0832  NA 139 137 139 139  --  141  K 3.4* 3.3* 2.9* 4.1  --  3.9  CL 109 108 109 111  --  107  CO2 22 23 22 22   --  27  GLUCOSE 118* 102* 93 164*  --   137*  BUN <5* <5* <5* <5*  --  <5*  CREATININE 0.82 0.77 0.65 0.78  --  0.82  CALCIUM 7.9* 8.0* 7.8* 8.5*  --  9.0  MG  --  2.1  --   --  1.7  --    GFR: Estimated Creatinine Clearance: 70.6 mL/min (by C-G formula based on SCr of 0.82 mg/dL). Liver Function Tests: Recent Labs  Lab 05/20/19 1604 05/21/19 0608  AST 13* 14*  ALT 13 12  ALKPHOS 88 79  BILITOT 0.7 0.6  PROT 7.2 6.6  ALBUMIN 3.5 3.2*   Recent Labs  Lab 05/20/19 1604  LIPASE 16   No results for input(s): AMMONIA in the last 168 hours. Coagulation Profile: No results for input(s): INR, PROTIME in the last 168 hours. Cardiac Enzymes: No results for input(s): CKTOTAL, CKMB, CKMBINDEX, TROPONINI in the last 168 hours. BNP (last 3 results) No results for input(s): PROBNP in the last 8760 hours. HbA1C: No results for input(s): HGBA1C in the last 72 hours. CBG: No results for input(s):  GLUCAP in the last 168 hours. Lipid Profile: No results for input(s): CHOL, HDL, LDLCALC, TRIG, CHOLHDL, LDLDIRECT in the last 72 hours. Thyroid Function Tests: No results for input(s): TSH, T4TOTAL, FREET4, T3FREE, THYROIDAB in the last 72 hours. Anemia Panel: No results for input(s): VITAMINB12, FOLATE, FERRITIN, TIBC, IRON, RETICCTPCT in the last 72 hours. Sepsis Labs: No results for input(s): PROCALCITON, LATICACIDVEN in the last 168 hours.  Recent Results (from the past 240 hour(s))  Urine culture     Status: None   Collection Time: 05/20/19  9:03 PM   Specimen: Urine, Clean Catch  Result Value Ref Range Status   Specimen Description   Final    URINE, CLEAN CATCH Performed at [facility], [address], [place], [place] [address]    Special Requests   Final    NONE Performed at [facility], [address] [place], [place] [address]    Culture   Final    Multiple bacterial morphotypes present, none predominant. Suggest appropriate recollection if clinically indicated.   Report Status 05/22/2019 FINAL  Final  SARS  CORONAVIRUS 2 Nasal Swab Aptima Multi Swab     Status: None   Collection Time: 05/20/19  9:20 PM   Specimen: Aptima Multi Swab; Nasal Swab  Result Value Ref Range Status   SARS Coronavirus 2 NEGATIVE NEGATIVE Final    Comment: (NOTE) SARS-CoV-2 target nucleic acids are NOT DETECTED. The SARS-CoV-2 RNA is generally detectable in upper and lower respiratory specimens during the acute phase of infection. Negative results do not preclude SARS-CoV-2 infection, do not rule out co-infections with other pathogens, and should not be used as the sole basis for treatment or other patient management decisions. Negative results must be combined with clinical observations, patient history, and epidemiological information. The expected result is Negative. Fact Sheet for Patients: SugarRoll.be Fact Sheet for Healthcare Providers: https://www.woods-mathews.com/ This test is not yet approved or cleared by the [place] FDA and  has been authorized for detection and/or diagnosis of SARS-CoV-2 by FDA under an Emergency Use Authorization (EUA). This EUA will remain  in effect (meaning this test can be used) for the duration of the COVID-19 declaration under Section 56 4(b)(1) of the Act, 21 U.S.C. section 360bbb-3(b)(1), unless the authorization is terminated or revoked sooner. Performed at [facility], [place] [address], [place], [place] [address]   SARS Coronavirus 2 [facility] order, Performed in [facility] hospital lab) Nasopharyngeal Nasopharyngeal Swab     Status: None   Collection Time: 05/21/19  8:16 AM   Specimen: Nasopharyngeal Swab  Result Value Ref Range Status   SARS Coronavirus 2 NEGATIVE NEGATIVE Final    Comment: (NOTE) If result is NEGATIVE SARS-CoV-2 target nucleic acids are NOT DETECTED. The SARS-CoV-2 RNA is generally detectable in upper and lower  respiratory specimens during the acute phase of infection. The lowest   concentration of SARS-CoV-2 viral copies this assay can detect is 250  copies / mL. A negative result does not preclude SARS-CoV-2 infection  and should not be used as the sole basis for treatment or other  patient management decisions.  A negative result may occur with  improper specimen collection / handling, submission of specimen other  than nasopharyngeal swab, presence of viral mutation(s) within the  areas targeted by this assay, and inadequate number of viral copies  (<250 copies / mL). A negative result must be combined with clinical  observations, patient history, and epidemiological information. If result is POSITIVE SARS-CoV-2 target nucleic acids  are DETECTED. The SARS-CoV-2 RNA is generally detectable in upper and lower  respiratory specimens dur ing the acute phase of infection.  Positive  results are indicative of active infection with SARS-CoV-2.  Clinical  correlation with patient history and other diagnostic information is  necessary to determine patient infection status.  Positive results do  not rule out bacterial infection or co-infection with other viruses. If result is PRESUMPTIVE POSTIVE SARS-CoV-2 nucleic acids MAY BE PRESENT.   A presumptive positive result was obtained on the submitted specimen  and confirmed on repeat testing.  While 2019 novel coronavirus  (SARS-CoV-2) nucleic acids may be present in the submitted sample  additional confirmatory testing may be necessary for epidemiological  and / or clinical management purposes  to differentiate between  SARS-CoV-2 and other Sarbecovirus currently known to infect humans.  If clinically indicated additional testing with an alternate test  methodology [phone]) is advised. The SARS-CoV-2 RNA is generally  detectable in upper and lower respiratory sp ecimens during the acute  phase of infection. The expected result is Negative. Fact Sheet for Patients:  StrictlyIdeas.no Fact Sheet  for Healthcare Providers: BankingDealers.co.za This test is not yet approved or cleared by the [place] FDA and has been authorized for detection and/or diagnosis of SARS-CoV-2 by FDA under an Emergency Use Authorization (EUA).  This EUA will remain in effect (meaning this test can be used) for the duration of the COVID-19 declaration under Section 564(b)(1) of the Act, 21 U.S.C. section 360bbb-3(b)(1), unless the authorization is terminated or revoked sooner. Performed at [facility], [address], [place], [place] [address]   Gastrointestinal Panel by PCR , Stool     Status: None   Collection Time: 05/22/19 10:30 AM   Specimen: Stool  Result Value Ref Range Status   Campylobacter species NOT DETECTED NOT DETECTED Final   Plesimonas shigelloides NOT DETECTED NOT DETECTED Final   Salmonella species NOT DETECTED NOT DETECTED Final   Yersinia enterocolitica NOT DETECTED NOT DETECTED Final   Vibrio species NOT DETECTED NOT DETECTED Final   Vibrio cholerae NOT DETECTED NOT DETECTED Final   Enteroaggregative E coli (EAEC) NOT DETECTED NOT DETECTED Final   Enteropathogenic E coli (EPEC) NOT DETECTED NOT DETECTED Final   Enterotoxigenic E coli (ETEC) NOT DETECTED NOT DETECTED Final   Shiga like toxin producing E coli (STEC) NOT DETECTED NOT DETECTED Final   Shigella/Enteroinvasive E coli (EIEC) NOT DETECTED NOT DETECTED Final   Cryptosporidium NOT DETECTED NOT DETECTED Final   Cyclospora cayetanensis NOT DETECTED NOT DETECTED Final   Entamoeba histolytica NOT DETECTED NOT DETECTED Final   Giardia lamblia NOT DETECTED NOT DETECTED Final   Adenovirus F40/41 NOT DETECTED NOT DETECTED Final   Astrovirus NOT DETECTED NOT DETECTED Final   Norovirus GI/GII NOT DETECTED NOT DETECTED Final   Rotavirus A NOT DETECTED NOT DETECTED Final   Sapovirus (I, II, IV, and V) NOT DETECTED NOT DETECTED Final    Comment: Performed at [facility], [address]  [place], [place] [address]  C difficile quick scan w PCR reflex     Status: Abnormal   Collection Time: 05/22/19 10:30 AM   Specimen: Stool  Result Value Ref Range Status   C Diff antigen POSITIVE (A) NEGATIVE Final   C Diff toxin POSITIVE (A) NEGATIVE Final   C Diff interpretation Toxin producing C. difficile detected.  Final    Comment: CRITICAL RESULT CALLED TO, READ BACK BY AND VERIFIED WITH:  M.TAYLOR @ [place] ON 8.23.20  BY LBB Performed at [facility], [address] [place], [place] [address]          Radiology Studies: No results found.      Scheduled Meds: . apixaban  5 mg Oral BID  . balsalazide  2,250 mg Oral TID  . buPROPion  150 mg Oral Daily  . busPIRone  7.5 mg Oral BID  . dicyclomine  10 mg Oral TID AC  . DULoxetine  120 mg Oral Daily  . famotidine  20 mg Oral BID AC  . hydrocortisone   Rectal BID  . ketotifen  1 drop Both Eyes Daily  . loratadine  10 mg Oral Daily  . metoprolol tartrate  12.5 mg Oral BID  . mometasone-formoterol  2 puff Inhalation BID  . pravastatin  20 mg Oral Daily  . pregabalin  100 mg Oral BID  . traZODone  100 mg Oral QHS  . vancomycin  125 mg Oral QID   Continuous Infusions:   LOS: 6 days    Time spent: 30 minutes    Souleymane Saiki Darleen Crocker, DO [facility] Pager [phone]  If 7PM-7AM, please contact night-coverage [email or web address] Password [facility] 05/27/2019, 2:55 PM

## 2019-05-28 DIAGNOSIS — A0472 Enterocolitis due to Clostridium difficile, not specified as recurrent: Secondary | ICD-10-CM

## 2019-05-28 DIAGNOSIS — K529 Noninfective gastroenteritis and colitis, unspecified: Secondary | ICD-10-CM

## 2019-05-28 MED ORDER — LOPERAMIDE HCL 2 MG PO CAPS
2.0000 mg | ORAL_CAPSULE | Freq: Two times a day (BID) | ORAL | Status: AC
  Administered 2019-05-28 (×2): 2 mg via ORAL
  Filled 2019-05-28 (×2): qty 1

## 2019-05-28 MED ORDER — VANCOMYCIN 50 MG/ML ORAL SOLUTION
500.0000 mg | Freq: Four times a day (QID) | ORAL | Status: DC
  Administered 2019-05-28 – 2019-06-02 (×16): 500 mg via ORAL
  Filled 2019-05-28 (×24): qty 10

## 2019-05-28 MED ORDER — POTASSIUM CHLORIDE IN NACL 20-0.9 MEQ/L-% IV SOLN
INTRAVENOUS | Status: DC
  Administered 2019-05-28 – 2019-05-29 (×3): via INTRAVENOUS

## 2019-05-28 NOTE — Progress Notes (Addendum)
  Subjective:  Patient remains with diarrhea.  She has had multiple bowel movements in the last 24 hours.  She is having mild abdominal discomfort in right lower quadrant.  No nausea or vomiting.  She is seeing small amount of blood with her bowel movements.  She is eating more than 50% of her meal.  He is now back on regular diet  Objective: Blood pressure (!) 93/50, pulse 77, temperature 98.3 F (36.8 C), temperature source Oral, resp. rate 18, height 5' 4"  (1.626 m), weight 90.7 kg, SpO2 95 %. Patient is alert and in no acute distress. Abdomen is protuberant.  Bowel sounds are hyperactive.  On palpation abdomen is soft.  She has mild tenderness in right lower quadrant.  No guarding.  No organomegaly or masses.  Labs/studies Results:  CBC Latest Ref Rng & Units 05/27/2019 05/26/2019 05/24/2019  WBC 4.0 - 10.5 K/uL 8.9 7.8 8.3  Hemoglobin 12.0 - 15.0 g/dL 11.7(L) 11.9(L) 11.3(L)  Hematocrit 36.0 - 46.0 % 36.9 34.7(L) 36.0  Platelets 150 - 400 K/uL 240 222 212    CMP Latest Ref Rng & Units 05/27/2019 05/26/2019 05/25/2019  Glucose 70 - 99 mg/dL 137(H) 164(H) 93  BUN 8 - 23 mg/dL <5(L) <5(L) <5(L)  Creatinine 0.44 - 1.00 mg/dL 0.82 0.78 0.65  Sodium 135 - 145 mmol/L 141 139 139  Potassium 3.5 - 5.1 mmol/L 3.9 4.1 2.9(L)  Chloride 98 - 111 mmol/L 107 111 109  CO2 22 - 32 mmol/L 27 22 22   Calcium 8.9 - 10.3 mg/dL 9.0 8.5(L) 7.8(L)  Total Protein 6.5 - 8.1 g/dL - - -  Total Bilirubin 0.3 - 1.2 mg/dL - - -  Alkaline Phos 38 - 126 U/L - - -  AST 15 - 41 U/L - - -  ALT 0 - 44 U/L - - -    Hepatic Function Latest Ref Rng & Units 05/21/2019 05/20/2019 03/10/2019  Total Protein 6.5 - 8.1 g/dL 6.6 7.2 6.7  Albumin 3.5 - 5.0 g/dL 3.2(L) 3.5 -  AST 15 - 41 U/L 14(L) 13(L) 10  ALT 0 - 44 U/L 12 13 7   Alk Phosphatase 38 - 126 U/L 79 88 -  Total Bilirubin 0.3 - 1.2 mg/dL 0.6 0.7 0.4    Lab Results  Component Value Date   CRP 6.6 03/10/2019      Assessment:  #1.  C. difficile colitis.  Day 7  on p.o. vancomycin.  Patient remains with significant diarrhea although she does not appear to be toxic or has abdominal distention to suggest ileus.  Condition may be difficult to treat given underlying IBD.  I am afraid she will require longer duration of treatment.  I believe she would benefit from higher dose.  #2.  Ulcerative colitis.  Patient has been on balsalazide for several years and has never had any diarrhea.  Recommendation:  Increase vancomycin dose to 500 mg p.o. 4 times daily. Loperamide 2 mg p.o. now and another dose this evening. Patient will be reevaluated in a.m.

## 2019-05-28 NOTE — Plan of Care (Signed)

## 2019-05-28 NOTE — Progress Notes (Signed)
PROGRESS NOTE    [name]  OHY:[id number] DOB: 04-Jun-1951 DOA: 05/20/2019 PCP: Jalene Mullet, PA-C   Brief Narrative:  68 y.o.female,with history of ulcerative colitis recent diagnosis of ulcerative pancolitis in June 2020, followed by GI as outpatient, atrial fibrillation on anticoagulation with Eliquis, COPD came to hospital with complaints of abdominal pain and diarrhea. Patient says that she had multiple loose bowel movements. Also generalized abdominal pain. CT scan of the abdomen pelvis done in the ED shows diffuse pancolitis infectious versus inflammatory. She was recently seen by gastroenterology as outpatient. Denies vomiting but complains of nausea Denies chest pain or shortness of breath. Complains of fever,but has been afebrile in the hospital. Lab work showed WBC 13.4 Patient empirically started on Cipro and Flagyl.  8/27:She has since been transitioned to vancomycin after discovery of C. difficile pancolitis and is currently day5of 14. She continues to have ongoing loose bowel movements.  8/28: Patient continues to have ongoing frequent bowel movements, but they appear to be slightly more formed.  She is currently on day 6 of 14 of vancomycin.  Bentyl restarted by GI today.  8/29: Patient continues to have ongoing frequent bowel movements that are watery.  Assessment & Plan:   Active Problems:   Colitis   Enteritis due to Clostridium difficile   C. difficile pancolitis-persistent -IV ciprofloxacin and Flagyl has been discontinued; patient started on vancomycin 125 mg every 6 hours and will continue treatment for a total of 14 days.Currentlyday7/14. -Given the still ongoing diarrhea following GI recommendationswith increase in Colazol dose today -Patient educated and encouraged to follow a lactose-free diet. -Continue supportive care. -Anusol cream has been added to assist with rectal discomfort from multiple diarrhea events. -Bentyl restarted  by GI on 8/28 -Consider Questran today  Hypokalemia-resolved -Secondary to ongoing bowel movements -Recheck for hypokalemia in a.m.  history of ulcerative colitis  -Continue Colazalat higher dose per GI -Steroids contraindicated in the presence of acute infection -Follow GI service recommendations and supportive care.  chronic atrial fibrillation -Rate controlled -Continue metoprolol and Eliquis -No need for telemetry  COPD -Stable and compensated -Continue PRN albuterol -Continue Dulera  depression/anxiety -No suicidal ideation or hallucination -Continue home psychotropic medications.   DVT prophylaxis:Eliquis Code Status:Full Family Communication:Husband at bedside Disposition Plan:Continue oral vancomycin for C. difficile infection withColazol increased8/26per GI.  Bentyl restarted on 8/20.  Will reassess for hypokalemia in a.m. and start IV fluid with potassium supplementation.  May need Questran.  Discussed with GI.   Consultants:  GI  Procedures:  None  Antimicrobials:  Anti-infectives (From admission, onward)   Start     Dose/Rate Route Frequency Ordered Stop   05/22/19 1400  vancomycin (VANCOCIN) 50 mg/mL oral solution 125 mg     125 mg Oral 4 times daily 05/22/19 1233 06/01/19 1359   05/21/19 0800  ciprofloxacin (CIPRO) IVPB 400 mg  Status:  Discontinued     400 mg 200 mL/hr over 60 Minutes Intravenous Every 12 hours 05/21/19 0213 05/22/19 1233   05/21/19 0400  metroNIDAZOLE (FLAGYL) IVPB 500 mg  Status:  Discontinued     500 mg 100 mL/hr over 60 Minutes Intravenous Every 8 hours 05/21/19 0213 05/22/19 1233   05/20/19 2000  ciprofloxacin (CIPRO) tablet 500 mg  Status:  Discontinued     500 mg Oral  Once 05/20/19 1951 05/20/19 1952   05/20/19 2000  metroNIDAZOLE (FLAGYL) tablet 500 mg  Status:  Discontinued     500 mg Oral  Once 05/20/19 1951 05/20/19  1952   05/20/19 2000  ciprofloxacin (CIPRO) IVPB 400 mg     400 mg 200 mL/hr over  60 Minutes Intravenous  Once 05/20/19 1952 05/20/19 2147   05/20/19 2000  metroNIDAZOLE (FLAGYL) IVPB 500 mg     500 mg 100 mL/hr over 60 Minutes Intravenous  Once 05/20/19 1952 05/20/19 2148       Subjective: Patient seen and evaluated today with ongoing abdominal cramping as well as watery bowel movements.  She states that she has had approximately 6 watery bowel movements overnight.  Objective: Vitals:   05/27/19 2230 05/28/19 0631 05/28/19 0811 05/28/19 1445  BP: (!) 104/45 (!) 114/50  (!) 93/50  Pulse: 70 68  77  Resp: 18 18    Temp: 98.4 F (36.9 C) 98.3 F (36.8 C)    TempSrc: Oral Oral    SpO2: 93% 96% 96% 95%  Weight:      Height:        Intake/Output Summary (Last 24 hours) at 05/28/2019 1538 Last data filed at 05/28/2019 1500 Gross per 24 hour  Intake 720 ml  Output -  Net 720 ml   Filed Weights   05/20/19 1506  Weight: 90.7 kg    Examination:  General exam: Appears calm and comfortable, hard of hearing Respiratory system: Clear to auscultation. Respiratory effort normal. Cardiovascular system: S1 & S2 heard, RRR. No JVD, murmurs, rubs, gallops or clicks. No pedal edema. Gastrointestinal system: Abdomen is nondistended, soft and nontender. No organomegaly or masses felt. Normal bowel sounds heard. Central nervous system: Alert and oriented. No focal neurological deficits. Extremities: Symmetric 5 x 5 power. Skin: No rashes, lesions or ulcers Psychiatry: Judgement and insight appear normal. Mood & affect appropriate.     Data Reviewed: I have personally reviewed following labs and imaging studies  CBC: Recent Labs  Lab 05/22/19 0642 05/24/19 0600 05/26/19 1104 05/27/19 0832  WBC 8.7 8.3 7.8 8.9  HGB 11.1* 11.3* 11.9* 11.7*  HCT 35.2* 36.0 34.7* 36.9  MCV 94.4 94.5 88.1 93.9  PLT 292 212 222 096   Basic Metabolic Panel: Recent Labs  Lab 05/23/19 0952 05/24/19 0600 05/25/19 0618 05/26/19 1104 05/26/19 1458 05/27/19 0832  NA 139 137 139  139  --  141  K 3.4* 3.3* 2.9* 4.1  --  3.9  CL 109 108 109 111  --  107  CO2 22 23 22 22   --  27  GLUCOSE 118* 102* 93 164*  --  137*  BUN <5* <5* <5* <5*  --  <5*  CREATININE 0.82 0.77 0.65 0.78  --  0.82  CALCIUM 7.9* 8.0* 7.8* 8.5*  --  9.0  MG  --  2.1  --   --  1.7  --    GFR: Estimated Creatinine Clearance: 70.6 mL/min (by C-G formula based on SCr of 0.82 mg/dL). Liver Function Tests: No results for input(s): AST, ALT, ALKPHOS, BILITOT, PROT, ALBUMIN in the last 168 hours. No results for input(s): LIPASE, AMYLASE in the last 168 hours. No results for input(s): AMMONIA in the last 168 hours. Coagulation Profile: No results for input(s): INR, PROTIME in the last 168 hours. Cardiac Enzymes: No results for input(s): CKTOTAL, CKMB, CKMBINDEX, TROPONINI in the last 168 hours. BNP (last 3 results) No results for input(s): PROBNP in the last 8760 hours. HbA1C: No results for input(s): HGBA1C in the last 72 hours. CBG: No results for input(s): GLUCAP in the last 168 hours. Lipid Profile: No results for input(s): CHOL, HDL,  LDLCALC, TRIG, CHOLHDL, LDLDIRECT in the last 72 hours. Thyroid Function Tests: No results for input(s): TSH, T4TOTAL, FREET4, T3FREE, THYROIDAB in the last 72 hours. Anemia Panel: No results for input(s): VITAMINB12, FOLATE, FERRITIN, TIBC, IRON, RETICCTPCT in the last 72 hours. Sepsis Labs: No results for input(s): PROCALCITON, LATICACIDVEN in the last 168 hours.  Recent Results (from the past 240 hour(s))  Urine culture     Status: None   Collection Time: 05/20/19  9:03 PM   Specimen: Urine, Clean Catch  Result Value Ref Range Status   Specimen Description   Final    URINE, CLEAN CATCH Performed at [facility], [address] [place], [place] [address]    Special Requests   Final    NONE Performed at [facility], [address], [place], [place] [address]    Culture   Final    Multiple bacterial morphotypes present, none predominant. Suggest  appropriate recollection if clinically indicated.   Report Status 05/22/2019 FINAL  Final  SARS CORONAVIRUS 2 Nasal Swab Aptima Multi Swab     Status: None   Collection Time: 05/20/19  9:20 PM   Specimen: Aptima Multi Swab; Nasal Swab  Result Value Ref Range Status   SARS Coronavirus 2 NEGATIVE NEGATIVE Final    Comment: (NOTE) SARS-CoV-2 target nucleic acids are NOT DETECTED. The SARS-CoV-2 RNA is generally detectable in upper and lower respiratory specimens during the acute phase of infection. Negative results do not preclude SARS-CoV-2 infection, do not rule out co-infections with other pathogens, and should not be used as the sole basis for treatment or other patient management decisions. Negative results must be combined with clinical observations, patient history, and epidemiological information. The expected result is Negative. Fact Sheet for Patients: SugarRoll.be Fact Sheet for Healthcare Providers: https://www.woods-mathews.com/ This test is not yet approved or cleared by the [place] FDA and  has been authorized for detection and/or diagnosis of SARS-CoV-2 by FDA under an Emergency Use Authorization (EUA). This EUA will remain  in effect (meaning this test can be used) for the duration of the COVID-19 declaration under Section 56 4(b)(1) of the Act, 21 U.S.C. section 360bbb-3(b)(1), unless the authorization is terminated or revoked sooner. Performed at [facility], [place] [address], [place], [place] [address]   SARS Coronavirus 2 [facility] order, Performed in [facility] hospital lab) Nasopharyngeal Nasopharyngeal Swab     Status: None   Collection Time: 05/21/19  8:16 AM   Specimen: Nasopharyngeal Swab  Result Value Ref Range Status   SARS Coronavirus 2 NEGATIVE NEGATIVE Final    Comment: (NOTE) If result is NEGATIVE SARS-CoV-2 target nucleic acids are NOT DETECTED. The SARS-CoV-2 RNA is generally detectable in  upper and lower  respiratory specimens during the acute phase of infection. The lowest  concentration of SARS-CoV-2 viral copies this assay can detect is 250  copies / mL. A negative result does not preclude SARS-CoV-2 infection  and should not be used as the sole basis for treatment or other  patient management decisions.  A negative result may occur with  improper specimen collection / handling, submission of specimen other  than nasopharyngeal swab, presence of viral mutation(s) within the  areas targeted by this assay, and inadequate number of viral copies  (<250 copies / mL). A negative result must be combined with clinical  observations, patient history, and epidemiological information. If result is POSITIVE SARS-CoV-2 target nucleic acids are DETECTED. The SARS-CoV-2 RNA is generally detectable in upper and lower  respiratory  specimens dur ing the acute phase of infection.  Positive  results are indicative of active infection with SARS-CoV-2.  Clinical  correlation with patient history and other diagnostic information is  necessary to determine patient infection status.  Positive results do  not rule out bacterial infection or co-infection with other viruses. If result is PRESUMPTIVE POSTIVE SARS-CoV-2 nucleic acids MAY BE PRESENT.   A presumptive positive result was obtained on the submitted specimen  and confirmed on repeat testing.  While 2019 novel coronavirus  (SARS-CoV-2) nucleic acids may be present in the submitted sample  additional confirmatory testing may be necessary for epidemiological  and / or clinical management purposes  to differentiate between  SARS-CoV-2 and other Sarbecovirus currently known to infect humans.  If clinically indicated additional testing with an alternate test  methodology [phone]) is advised. The SARS-CoV-2 RNA is generally  detectable in upper and lower respiratory sp ecimens during the acute  phase of infection. The expected result is  Negative. Fact Sheet for Patients:  StrictlyIdeas.no Fact Sheet for Healthcare Providers: BankingDealers.co.za This test is not yet approved or cleared by the [place] FDA and has been authorized for detection and/or diagnosis of SARS-CoV-2 by FDA under an Emergency Use Authorization (EUA).  This EUA will remain in effect (meaning this test can be used) for the duration of the COVID-19 declaration under Section 564(b)(1) of the Act, 21 U.S.C. section 360bbb-3(b)(1), unless the authorization is terminated or revoked sooner. Performed at [facility], [address] [place], [place] [address]   Gastrointestinal Panel by PCR , Stool     Status: None   Collection Time: 05/22/19 10:30 AM   Specimen: Stool  Result Value Ref Range Status   Campylobacter species NOT DETECTED NOT DETECTED Final   Plesimonas shigelloides NOT DETECTED NOT DETECTED Final   Salmonella species NOT DETECTED NOT DETECTED Final   Yersinia enterocolitica NOT DETECTED NOT DETECTED Final   Vibrio species NOT DETECTED NOT DETECTED Final   Vibrio cholerae NOT DETECTED NOT DETECTED Final   Enteroaggregative E coli (EAEC) NOT DETECTED NOT DETECTED Final   Enteropathogenic E coli (EPEC) NOT DETECTED NOT DETECTED Final   Enterotoxigenic E coli (ETEC) NOT DETECTED NOT DETECTED Final   Shiga like toxin producing E coli (STEC) NOT DETECTED NOT DETECTED Final   Shigella/Enteroinvasive E coli (EIEC) NOT DETECTED NOT DETECTED Final   Cryptosporidium NOT DETECTED NOT DETECTED Final   Cyclospora cayetanensis NOT DETECTED NOT DETECTED Final   Entamoeba histolytica NOT DETECTED NOT DETECTED Final   Giardia lamblia NOT DETECTED NOT DETECTED Final   Adenovirus F40/41 NOT DETECTED NOT DETECTED Final   Astrovirus NOT DETECTED NOT DETECTED Final   Norovirus GI/GII NOT DETECTED NOT DETECTED Final   Rotavirus A NOT DETECTED NOT DETECTED Final   Sapovirus (I, II, IV, and V) NOT DETECTED NOT  DETECTED Final    Comment: Performed at [facility], [address] [place], [place] [address]  C difficile quick scan w PCR reflex     Status: Abnormal   Collection Time: 05/22/19 10:30 AM   Specimen: Stool  Result Value Ref Range Status   C Diff antigen POSITIVE (A) NEGATIVE Final   C Diff toxin POSITIVE (A) NEGATIVE Final   C Diff interpretation Toxin producing C. difficile detected.  Final    Comment: CRITICAL RESULT CALLED TO, READ BACK BY AND VERIFIED WITH:  M.TAYLOR @ 3343 ON 8.23.20 BY LBB Performed at [facility], [address], [place], [place] [address]  Radiology Studies: No results found.      Scheduled Meds: . apixaban  5 mg Oral BID  . balsalazide  2,250 mg Oral TID  . buPROPion  150 mg Oral Daily  . busPIRone  7.5 mg Oral BID  . dicyclomine  10 mg Oral TID AC  . DULoxetine  120 mg Oral Daily  . famotidine  20 mg Oral BID AC  . hydrocortisone   Rectal BID  . ketotifen  1 drop Both Eyes Daily  . loratadine  10 mg Oral Daily  . metoprolol tartrate  12.5 mg Oral BID  . mometasone-formoterol  2 puff Inhalation BID  . pravastatin  20 mg Oral Daily  . pregabalin  100 mg Oral BID  . traZODone  100 mg Oral QHS  . vancomycin  125 mg Oral QID   Continuous Infusions: . 0.9 % NaCl with KCl 20 mEq / L       LOS: 7 days    Time spent: 30 minutes    Cassara Nida Darleen Crocker, DO [facility] Pager [phone]  If 7PM-7AM, please contact night-coverage [email or web address] Password [facility] 05/28/2019, 3:38 PM

## 2019-05-28 NOTE — Progress Notes (Signed)
Called vascular wellness to have Midline placed.

## 2019-05-29 LAB — BASIC METABOLIC PANEL
Anion gap: 5 (ref 5–15)
BUN: <5 mg/dL — ABNORMAL LOW (ref 8–23)
CO2: 28 mmol/L (ref 22–32)
Calcium: 8.3 mg/dL — ABNORMAL LOW (ref 8.9–10.3)
Chloride: 107 mmol/L (ref 98–111)
Creatinine, Ser: 0.81 mg/dL (ref 0.44–1.00)
GFR calc Af Amer: >60 mL/min (ref >60)
GFR calc non Af Amer: >60 mL/min (ref >60)
Glucose, Bld: 114 mg/dL — ABNORMAL HIGH (ref 70–99)
Potassium: 2.7 mmol/L — CL (ref 3.5–5.1)
Sodium: 140 mmol/L (ref 135–145)

## 2019-05-29 LAB — MAGNESIUM: Magnesium: 1.5 mg/dL — ABNORMAL LOW (ref 1.7–2.4)

## 2019-05-29 MED ORDER — LOPERAMIDE HCL 2 MG PO CAPS
2.0000 mg | ORAL_CAPSULE | Freq: Once | ORAL | Status: AC
  Administered 2019-05-29: 17:00:00 2 mg via ORAL
  Filled 2019-05-29: qty 1

## 2019-05-29 MED ORDER — LIDOCAINE 5 % EX PTCH
1.0000 | MEDICATED_PATCH | CUTANEOUS | Status: DC
  Administered 2019-05-29 – 2019-06-09 (×8): 1 via TRANSDERMAL
  Filled 2019-05-29 (×8): qty 1

## 2019-05-29 MED ORDER — VANCOMYCIN 50 MG/ML ORAL SOLUTION
ORAL | Status: AC
  Filled 2019-05-29: qty 10

## 2019-05-29 MED ORDER — LOPERAMIDE HCL 2 MG PO CAPS
2.0000 mg | ORAL_CAPSULE | Freq: Once | ORAL | Status: AC
  Administered 2019-05-29: 2 mg via ORAL
  Filled 2019-05-29: qty 1

## 2019-05-29 MED ORDER — MAGNESIUM SULFATE 2 GM/50ML IV SOLN
2.0000 g | Freq: Once | INTRAVENOUS | Status: AC
  Administered 2019-05-29: 13:00:00 2 g via INTRAVENOUS
  Filled 2019-05-29: qty 50

## 2019-05-29 MED ORDER — POTASSIUM CHLORIDE CRYS ER 20 MEQ PO TBCR
40.0000 meq | EXTENDED_RELEASE_TABLET | Freq: Three times a day (TID) | ORAL | Status: AC
  Administered 2019-05-29 (×3): 40 meq via ORAL
  Filled 2019-05-29 (×3): qty 2

## 2019-05-29 NOTE — Progress Notes (Signed)
PROGRESS NOTE    [name]  OZD:[id number] DOB: 02/03/1951 DOA: 05/20/2019 PCP: Jalene Mullet, PA-C   Brief Narrative:  68 y.o.female,with history of ulcerative colitis recent diagnosis of ulcerative pancolitis in June 2020, followed by GI as outpatient, atrial fibrillation on anticoagulation with Eliquis, COPD came to hospital with complaints of abdominal pain and diarrhea. Patient says that she had multiple loose bowel movements. Also generalized abdominal pain. CT scan of the abdomen pelvis done in the ED shows diffuse pancolitis infectious versus inflammatory. She was recently seen by gastroenterology as outpatient. Denies vomiting but complains of nausea Denies chest pain or shortness of breath. Complains of fever,but has been afebrile in the hospital. Lab work showed WBC 13.4 Patient empirically started on Cipro and Flagyl.  8/27:She has since been transitioned to vancomycin after discovery of C. difficile pancolitis and is currently day5of 14. She continues to have ongoing loose bowel movements.  8/28:Patient continues to have ongoing frequent bowel movements, but they appear to be slightly more formed. She is currently on day 6 of 14 of vancomycin. Bentyl restarted by GI today.  8/29: Patient continues to have ongoing frequent bowel movements that are watery.  8/30: Patient has had improvement in frequency of bowel movements and has only had approximately 3 overnight, but they are still watery.  She is noted to be significantly hypokalemic this morning.  She has had midline placement yesterday with IV fluids and potassium supplementation already initiated.  Her vancomycin dose was increased by GI yesterday and loperamide added.  Assessment & Plan:   Active Problems:   Colitis   Enteritis due to Clostridium difficile   C. difficile pancolitis-persistent, but improving -IV ciprofloxacin and Flagyl has been discontinued; patient started on vancomycin 125 mg  every 6 hours, now to 500 mg every 6 hours on 8/29 and will continue treatment for a total of 14 days.Currentlyday8/14. -Given the still ongoing diarrhea, but this is improving -Patient educated and encouraged to follow a lactose-free diet. -Continue supportive care. -Anusol cream has been added to assist with rectal discomfort from multiple diarrhea events. -Bentyl restarted by GI on 8/28 -Imodium added by GI 8/29 with improvement  Hypokalemia- severe/hypomagnesemia -Secondary to ongoing bowel movements -Continue repletion via IV fluid as well as oral today -IV magnesium supplementation today -Recheck for hypokalemia in a.m. along with hypomagnesemia  history of ulcerative colitis  -Continue Colazalat higher dose per GI -Steroids contraindicated in the presence of acute infection -Follow GI service recommendations and supportive care.  chronic atrial fibrillation -Rate controlled -Continue metoprolol and Eliquis -No need for telemetry  COPD -Stable and compensated -Continue PRN albuterol -Continue Dulera  depression/anxiety -No suicidal ideation or hallucination -Continue home psychotropic medications.   DVT prophylaxis:Eliquis Code Status:Full Family Communication:Husband at bedside Disposition Plan:Continue oral vancomycin for C. difficile infection withColazol increased8/26per GI.Bentyl restarted on 8/20.   Imodium started 8/29.   Will replete potassium and magnesium today and reassess electrolytes in a.m.  Continue IV fluid.   Consultants:  GI  Procedures:  None  Antimicrobials:  Anti-infectives (From admission, onward)   Start     Dose/Rate Route Frequency Ordered Stop   05/28/19 1800  vancomycin (VANCOCIN) 50 mg/mL oral solution 500 mg     500 mg Oral 4 times daily 05/28/19 1605 06/01/19 1359   05/22/19 1400  vancomycin (VANCOCIN) 50 mg/mL oral solution 125 mg  Status:  Discontinued     125 mg Oral 4 times daily 05/22/19 1233  05/28/19 1605   05/21/19  0800  ciprofloxacin (CIPRO) IVPB 400 mg  Status:  Discontinued     400 mg 200 mL/hr over 60 Minutes Intravenous Every 12 hours 05/21/19 0213 05/22/19 1233   05/21/19 0400  metroNIDAZOLE (FLAGYL) IVPB 500 mg  Status:  Discontinued     500 mg 100 mL/hr over 60 Minutes Intravenous Every 8 hours 05/21/19 0213 05/22/19 1233   05/20/19 2000  ciprofloxacin (CIPRO) tablet 500 mg  Status:  Discontinued     500 mg Oral  Once 05/20/19 1951 05/20/19 1952   05/20/19 2000  metroNIDAZOLE (FLAGYL) tablet 500 mg  Status:  Discontinued     500 mg Oral  Once 05/20/19 1951 05/20/19 1952   05/20/19 2000  ciprofloxacin (CIPRO) IVPB 400 mg     400 mg 200 mL/hr over 60 Minutes Intravenous  Once 05/20/19 1952 05/20/19 2147   05/20/19 2000  metroNIDAZOLE (FLAGYL) IVPB 500 mg     500 mg 100 mL/hr over 60 Minutes Intravenous  Once 05/20/19 1952 05/20/19 2148       Subjective: Patient seen and evaluated today with no new acute complaints or concerns. No acute concerns or events noted overnight.  She states that she has had less frequent watery bowel movements overnight.  She states she has had a total of 3.  She is noted to be severely hypokalemic this morning.  Objective: Vitals:   05/28/19 1959 05/28/19 2100 05/29/19 0519 05/29/19 0746  BP:  (!) 104/57 101/60   Pulse: 71 75 66   Resp: 18 18 18    Temp:   (!) 97.4 F (36.3 C)   TempSrc:   Axillary   SpO2: 98% 92% 94% 93%  Weight:      Height:        Intake/Output Summary (Last 24 hours) at 05/29/2019 1122 Last data filed at 05/29/2019 0900 Gross per 24 hour  Intake 1513.79 ml  Output -  Net 1513.79 ml   Filed Weights   05/20/19 1506  Weight: 90.7 kg    Examination:  General exam: Appears calm and comfortable  Respiratory system: Clear to auscultation. Respiratory effort normal. Cardiovascular system: S1 & S2 heard, RRR. No JVD, murmurs, rubs, gallops or clicks. No pedal edema. Gastrointestinal system: Abdomen is  nondistended, soft and nontender. No organomegaly or masses felt. Normal bowel sounds heard. Central nervous system: Alert and oriented. No focal neurological deficits. Extremities: Symmetric 5 x 5 power. Skin: No rashes, lesions or ulcers Psychiatry: Judgement and insight appear normal. Mood & affect appropriate.     Data Reviewed: I have personally reviewed following labs and imaging studies  CBC: Recent Labs  Lab 05/24/19 0600 05/26/19 1104 05/27/19 0832  WBC 8.3 7.8 8.9  HGB 11.3* 11.9* 11.7*  HCT 36.0 34.7* 36.9  MCV 94.5 88.1 93.9  PLT 212 222 465   Basic Metabolic Panel: Recent Labs  Lab 05/24/19 0600 05/25/19 0618 05/26/19 1104 05/26/19 1458 05/27/19 0832 05/29/19 0609  NA 137 139 139  --  141 140  K 3.3* 2.9* 4.1  --  3.9 2.7*  CL 108 109 111  --  107 107  CO2 23 22 22   --  27 28  GLUCOSE 102* 93 164*  --  137* 114*  BUN <5* <5* <5*  --  <5* <5*  CREATININE 0.77 0.65 0.78  --  0.82 0.81  CALCIUM 8.0* 7.8* 8.5*  --  9.0 8.3*  MG 2.1  --   --  1.7  --  1.5*   GFR: Estimated  Creatinine Clearance: 71.5 mL/min (by C-G formula based on SCr of 0.81 mg/dL). Liver Function Tests: No results for input(s): AST, ALT, ALKPHOS, BILITOT, PROT, ALBUMIN in the last 168 hours. No results for input(s): LIPASE, AMYLASE in the last 168 hours. No results for input(s): AMMONIA in the last 168 hours. Coagulation Profile: No results for input(s): INR, PROTIME in the last 168 hours. Cardiac Enzymes: No results for input(s): CKTOTAL, CKMB, CKMBINDEX, TROPONINI in the last 168 hours. BNP (last 3 results) No results for input(s): PROBNP in the last 8760 hours. HbA1C: No results for input(s): HGBA1C in the last 72 hours. CBG: No results for input(s): GLUCAP in the last 168 hours. Lipid Profile: No results for input(s): CHOL, HDL, LDLCALC, TRIG, CHOLHDL, LDLDIRECT in the last 72 hours. Thyroid Function Tests: No results for input(s): TSH, T4TOTAL, FREET4, T3FREE, THYROIDAB in  the last 72 hours. Anemia Panel: No results for input(s): VITAMINB12, FOLATE, FERRITIN, TIBC, IRON, RETICCTPCT in the last 72 hours. Sepsis Labs: No results for input(s): PROCALCITON, LATICACIDVEN in the last 168 hours.  Recent Results (from the past 240 hour(s))  Urine culture     Status: None   Collection Time: 05/20/19  9:03 PM   Specimen: Urine, Clean Catch  Result Value Ref Range Status   Specimen Description   Final    URINE, CLEAN CATCH Performed at [facility], [address] [place], [place] [address]    Special Requests   Final    NONE Performed at [facility], [address], [place], [place] [address]    Culture   Final    Multiple bacterial morphotypes present, none predominant. Suggest appropriate recollection if clinically indicated.   Report Status 05/22/2019 FINAL  Final  SARS CORONAVIRUS 2 Nasal Swab Aptima Multi Swab     Status: None   Collection Time: 05/20/19  9:20 PM   Specimen: Aptima Multi Swab; Nasal Swab  Result Value Ref Range Status   SARS Coronavirus 2 NEGATIVE NEGATIVE Final    Comment: (NOTE) SARS-CoV-2 target nucleic acids are NOT DETECTED. The SARS-CoV-2 RNA is generally detectable in upper and lower respiratory specimens during the acute phase of infection. Negative results do not preclude SARS-CoV-2 infection, do not rule out co-infections with other pathogens, and should not be used as the sole basis for treatment or other patient management decisions. Negative results must be combined with clinical observations, patient history, and epidemiological information. The expected result is Negative. Fact Sheet for Patients: SugarRoll.be Fact Sheet for Healthcare Providers: https://www.woods-mathews.com/ This test is not yet approved or cleared by the [place] FDA and  has been authorized for detection and/or diagnosis of SARS-CoV-2 by FDA under an Emergency Use Authorization (EUA). This EUA will  remain  in effect (meaning this test can be used) for the duration of the COVID-19 declaration under Section 56 4(b)(1) of the Act, 21 U.S.C. section 360bbb-3(b)(1), unless the authorization is terminated or revoked sooner. Performed at [facility], [address], [place], [place] [address]   SARS Coronavirus 2 [facility] order, Performed in [facility] hospital lab) Nasopharyngeal Nasopharyngeal Swab     Status: None   Collection Time: 05/21/19  8:16 AM   Specimen: Nasopharyngeal Swab  Result Value Ref Range Status   SARS Coronavirus 2 NEGATIVE NEGATIVE Final    Comment: (NOTE) If result is NEGATIVE SARS-CoV-2 target nucleic acids are NOT DETECTED. The SARS-CoV-2 RNA is generally detectable in upper and lower  respiratory specimens during the acute phase of infection. The  lowest  concentration of SARS-CoV-2 viral copies this assay can detect is 250  copies / mL. A negative result does not preclude SARS-CoV-2 infection  and should not be used as the sole basis for treatment or other  patient management decisions.  A negative result may occur with  improper specimen collection / handling, submission of specimen other  than nasopharyngeal swab, presence of viral mutation(s) within the  areas targeted by this assay, and inadequate number of viral copies  (<250 copies / mL). A negative result must be combined with clinical  observations, patient history, and epidemiological information. If result is POSITIVE SARS-CoV-2 target nucleic acids are DETECTED. The SARS-CoV-2 RNA is generally detectable in upper and lower  respiratory specimens dur ing the acute phase of infection.  Positive  results are indicative of active infection with SARS-CoV-2.  Clinical  correlation with patient history and other diagnostic information is  necessary to determine patient infection status.  Positive results do  not rule out bacterial infection or co-infection with other viruses. If result is  PRESUMPTIVE POSTIVE SARS-CoV-2 nucleic acids MAY BE PRESENT.   A presumptive positive result was obtained on the submitted specimen  and confirmed on repeat testing.  While 2019 novel coronavirus  (SARS-CoV-2) nucleic acids may be present in the submitted sample  additional confirmatory testing may be necessary for epidemiological  and / or clinical management purposes  to differentiate between  SARS-CoV-2 and other Sarbecovirus currently known to infect humans.  If clinically indicated additional testing with an alternate test  methodology [phone]) is advised. The SARS-CoV-2 RNA is generally  detectable in upper and lower respiratory sp ecimens during the acute  phase of infection. The expected result is Negative. Fact Sheet for Patients:  StrictlyIdeas.no Fact Sheet for Healthcare Providers: BankingDealers.co.za This test is not yet approved or cleared by the [place] FDA and has been authorized for detection and/or diagnosis of SARS-CoV-2 by FDA under an Emergency Use Authorization (EUA).  This EUA will remain in effect (meaning this test can be used) for the duration of the COVID-19 declaration under Section 564(b)(1) of the Act, 21 U.S.C. section 360bbb-3(b)(1), unless the authorization is terminated or revoked sooner. Performed at [facility], [address] [place], [place] [address]   Gastrointestinal Panel by PCR , Stool     Status: None   Collection Time: 05/22/19 10:30 AM   Specimen: Stool  Result Value Ref Range Status   Campylobacter species NOT DETECTED NOT DETECTED Final   Plesimonas shigelloides NOT DETECTED NOT DETECTED Final   Salmonella species NOT DETECTED NOT DETECTED Final   Yersinia enterocolitica NOT DETECTED NOT DETECTED Final   Vibrio species NOT DETECTED NOT DETECTED Final   Vibrio cholerae NOT DETECTED NOT DETECTED Final   Enteroaggregative E coli (EAEC) NOT DETECTED NOT DETECTED Final    Enteropathogenic E coli (EPEC) NOT DETECTED NOT DETECTED Final   Enterotoxigenic E coli (ETEC) NOT DETECTED NOT DETECTED Final   Shiga like toxin producing E coli (STEC) NOT DETECTED NOT DETECTED Final   Shigella/Enteroinvasive E coli (EIEC) NOT DETECTED NOT DETECTED Final   Cryptosporidium NOT DETECTED NOT DETECTED Final   Cyclospora cayetanensis NOT DETECTED NOT DETECTED Final   Entamoeba histolytica NOT DETECTED NOT DETECTED Final   Giardia lamblia NOT DETECTED NOT DETECTED Final   Adenovirus F40/41 NOT DETECTED NOT DETECTED Final   Astrovirus NOT DETECTED NOT DETECTED Final   Norovirus GI/GII NOT DETECTED NOT DETECTED Final   Rotavirus A NOT DETECTED NOT DETECTED Final  Sapovirus (I, II, IV, and V) NOT DETECTED NOT DETECTED Final    Comment: Performed at [facility], [address] [place], [place] [address]  C difficile quick scan w PCR reflex     Status: Abnormal   Collection Time: 05/22/19 10:30 AM   Specimen: Stool  Result Value Ref Range Status   C Diff antigen POSITIVE (A) NEGATIVE Final   C Diff toxin POSITIVE (A) NEGATIVE Final   C Diff interpretation Toxin producing C. difficile detected.  Final    Comment: CRITICAL RESULT CALLED TO, READ BACK BY AND VERIFIED WITH:  M.TAYLOR @ 9518 ON 8.23.20 BY LBB Performed at [facility], [address], [place], [place] [address]          Radiology Studies: No results found.      Scheduled Meds: . apixaban  5 mg Oral BID  . balsalazide  2,250 mg Oral TID  . buPROPion  150 mg Oral Daily  . busPIRone  7.5 mg Oral BID  . dicyclomine  10 mg Oral TID AC  . DULoxetine  120 mg Oral Daily  . famotidine  20 mg Oral BID AC  . hydrocortisone   Rectal BID  . ketotifen  1 drop Both Eyes Daily  . lidocaine  1 patch Transdermal Q24H  . loratadine  10 mg Oral Daily  . metoprolol tartrate  12.5 mg Oral BID  . mometasone-formoterol  2 puff Inhalation BID  . potassium chloride  40 mEq Oral TID  . pravastatin  20 mg Oral  Daily  . pregabalin  100 mg Oral BID  . traZODone  100 mg Oral QHS  . vancomycin  500 mg Oral QID   Continuous Infusions: . 0.9 % NaCl with KCl 20 mEq / L 20 mL/hr at 05/29/19 0910  . magnesium sulfate bolus IVPB       LOS: 8 days    Time spent: 30 minutes    Vince Ainsley Darleen Crocker, DO [facility] Pager [phone]  If 7PM-7AM, please contact night-coverage [email or web address] Password [email or web address] 05/29/2019, 11:22 AM

## 2019-05-29 NOTE — Plan of Care (Signed)
  Problem: Activity: Goal: Risk for activity intolerance will decrease Outcome: Progressing   Problem: Nutrition: Goal: Adequate nutrition will be maintained Outcome: Progressing   

## 2019-05-29 NOTE — Progress Notes (Signed)
CRITICAL VALUE ALERT  Critical Value:  Potassium 2.7  Date & Time Notied:  05/29/2019 at 0755  Provider Notified: Dr. Manuella Ghazi  Orders Received/Actions taken: awaiting instructions

## 2019-05-29 NOTE — Progress Notes (Signed)
  Subjective:  Patient has no complaints.  She is eating maybe half of her meals.  She says she was given eggs at breakfast and she does not like eggs.  She is having intermittent pain in light lower quadrant which is mild.  She says she had 3 stools during the night and she has had 3 stools since daylight today.  Stool volume is decreasing and it is not as thin as before.  Objective: Blood pressure 101/60, pulse 66, temperature (!) 97.4 F (36.3 C), temperature source Axillary, resp. rate 18, height 5' 4"  (1.626 m), weight 90.7 kg, SpO2 93 %. Patient is alert and in no acute distress. Abdomen is protuberant bowel sounds are normal.  On palpation abdomen is soft.  She remains with mild tenderness right lower quadrant.  No organomegaly or masses.  Labs/studies Results:  CBC Latest Ref Rng & Units 05/27/2019 05/26/2019 05/24/2019  WBC 4.0 - 10.5 K/uL 8.9 7.8 8.3  Hemoglobin 12.0 - 15.0 g/dL 11.7(L) 11.9(L) 11.3(L)  Hematocrit 36.0 - 46.0 % 36.9 34.7(L) 36.0  Platelets 150 - 400 K/uL 240 222 212    CMP Latest Ref Rng & Units 05/29/2019 05/27/2019 05/26/2019  Glucose 70 - 99 mg/dL 114(H) 137(H) 164(H)  BUN 8 - 23 mg/dL <5(L) <5(L) <5(L)  Creatinine 0.44 - 1.00 mg/dL 0.81 0.82 0.78  Sodium 135 - 145 mmol/L 140 141 139  Potassium 3.5 - 5.1 mmol/L 2.7(LL) 3.9 4.1  Chloride 98 - 111 mmol/L 107 107 111  CO2 22 - 32 mmol/L 28 27 22   Calcium 8.9 - 10.3 mg/dL 8.3(L) 9.0 8.5(L)  Total Protein 6.5 - 8.1 g/dL - - -  Total Bilirubin 0.3 - 1.2 mg/dL - - -  Alkaline Phos 38 - 126 U/L - - -  AST 15 - 41 U/L - - -  ALT 0 - 44 U/L - - -    Hepatic Function Latest Ref Rng & Units 05/21/2019 05/20/2019 03/10/2019  Total Protein 6.5 - 8.1 g/dL 6.6 7.2 6.7  Albumin 3.5 - 5.0 g/dL 3.2(L) 3.5 -  AST 15 - 41 U/L 14(L) 13(L) 10  ALT 0 - 44 U/L 12 13 7   Alk Phosphatase 38 - 126 U/L 79 88 -  Total Bilirubin 0.3 - 1.2 mg/dL 0.6 0.7 0.4      Assessment:  #1.  C. difficile colitis.  Patient slow to respond to  therapy.  Vancomycin dose was increased yesterday.  She has completed 7 days of therapy and today is day 8.  She was given 2 doses of loperamide yesterday.  She is on low-dose dicyclomine as recommended by Dr. Oneida Alar.  She does not appear toxic.  Abdominal exam does not suggest megacolon.  Poor response to therapy most likely related to underlying IBD.  #2.  Hypokalemia secondary to GI losses.  Dr. Manuella Ghazi treating her hypokalemia with IV potassium.  #3.  Ulcerative colitis.  She is on oral mesalamine.    #4.  Chronic atrial fibrillation.  Patient is on anticoagulant and luckily not having significant bleed and she is maintaining her hemoglobin.   Recommendations:  Continue with vancomycin at current dose. Loperamide 2 mg x 2 doses today. If patient's diarrhea persists will consider flexible sigmoidoscopy to make sure that her UC has not relapse.

## 2019-05-30 LAB — CBC
HCT: 33.8 % — ABNORMAL LOW (ref 36.0–46.0)
Hemoglobin: 10.5 g/dL — ABNORMAL LOW (ref 12.0–15.0)
MCH: 29.7 pg (ref 26.0–34.0)
MCHC: 31.1 g/dL (ref 30.0–36.0)
MCV: 95.5 fL (ref 80.0–100.0)
Platelets: 315 10*3/uL (ref 150–400)
RBC: 3.54 MIL/uL — ABNORMAL LOW (ref 3.87–5.11)
RDW: 15.7 % — ABNORMAL HIGH (ref 11.5–15.5)
WBC: 9.9 10*3/uL (ref 4.0–10.5)
nRBC: 0 % (ref 0.0–0.2)

## 2019-05-30 LAB — BASIC METABOLIC PANEL
Anion gap: 6 (ref 5–15)
BUN: <5 mg/dL — ABNORMAL LOW (ref 8–23)
CO2: 25 mmol/L (ref 22–32)
Calcium: 8.1 mg/dL — ABNORMAL LOW (ref 8.9–10.3)
Chloride: 110 mmol/L (ref 98–111)
Creatinine, Ser: 0.73 mg/dL (ref 0.44–1.00)
GFR calc Af Amer: >60 mL/min (ref >60)
GFR calc non Af Amer: >60 mL/min (ref >60)
Glucose, Bld: 103 mg/dL — ABNORMAL HIGH (ref 70–99)
Potassium: 4.9 mmol/L (ref 3.5–5.1)
Sodium: 141 mmol/L (ref 135–145)

## 2019-05-30 LAB — MAGNESIUM: Magnesium: 2.1 mg/dL (ref 1.7–2.4)

## 2019-05-30 MED ORDER — APIXABAN 5 MG PO TABS: 5.0000 mg | ORAL_TABLET | Freq: Two times a day (BID) | ORAL | Status: DC

## 2019-05-30 MED ORDER — DICYCLOMINE HCL 10 MG PO CAPS
10.0000 mg | ORAL_CAPSULE | Freq: Three times a day (TID) | ORAL | Status: DC
  Administered 2019-05-31 – 2019-06-01 (×3): 10 mg via ORAL
  Filled 2019-05-30 (×4): qty 1

## 2019-05-30 NOTE — Progress Notes (Signed)
Initial Nutrition Assessment  DOCUMENTATION CODES:   Obesity unspecified  INTERVENTION:  Follow for care progression and results of procedure   NUTRITION DIAGNOSIS:   Inadequate oral intake related to diarrhea as evidenced by (full liquid diet and ongoing watery stools).   GOAL:  Patient will meet greater than or equal to 90% of their needs   MONITOR:  Diet advancement, PO intake, Labs, Skin, Weight trends   REASON FOR ASSESSMENT:   LOS   ASSESSMENT: Patient is a 68 yo female with persisting watery stools, + c.diff pancolitis. History of UC, COPD, GERD, Pancreatitis, anemia, DM-2, HOH.  Meal intake: 8/28-8/30: 75-100% all meals. Patient currently consuming full liquid meals. NPO after midnight for Flex sigmoidoscopy and possible colonoscopy tomorrow.  Weight is stable between 90-92 kg the past 6 months. November of last year she weighed 85.8 kg.  Medications reviewed and include: Pepcid, Lopressor, Lyrica.  Labs: BMP Latest Ref Rng & Units 05/30/2019 05/29/2019 05/27/2019  Glucose 70 - 99 mg/dL 103(H) 114(H) 137(H)  BUN 8 - 23 mg/dL <5(L) <5(L) <5(L)  Creatinine 0.44 - 1.00 mg/dL 0.73 0.81 0.82  BUN/Creat Ratio 6 - 22 (calc) - - -  Sodium 135 - 145 mmol/L 141 140 141  Potassium 3.5 - 5.1 mmol/L 4.9 2.7(LL) 3.9  Chloride 98 - 111 mmol/L 110 107 107  CO2 22 - 32 mmol/L 25 28 27   Calcium 8.9 - 10.3 mg/dL 8.1(L) 8.3(L) 9.0     NUTRITION - FOCUSED PHYSICAL EXAM: Nutrition-Focused physical exam: no fat depletion, no muscle depletion, and no edema.     Diet Order:   Diet Order            Diet NPO time specified Except for: Sips with Meds  Diet effective midnight        Diet full liquid Room service appropriate? Yes; Fluid consistency: Thin  Diet effective now              EDUCATION NEEDS:  Not appropriate for education at this time   Skin:  Skin Assessment: Reviewed RN Assessment  Last BM:  8/31- pt inconteninet per nursing  Height:   Ht Readings from Last  1 Encounters:  05/20/19 5' 4"  (1.626 m)    Weight:   Wt Readings from Last 1 Encounters:  05/20/19 90.7 kg    Ideal Body Weight:  55 kg  BMI:  Body mass index is 34.33 kg/m.  Estimated Nutritional Needs:   Kcal:  1564-1706 (MSJ x 1.1-1.2 AF)  Protein:  83-99 gr (1.5-1.8 gr/kg/ibw)  Fluid:  1.6-1.7 liters daily - normal needs   Colman Cater MS,RD,CSG,LDN Office: [phone] Pager: [phone]

## 2019-05-30 NOTE — Care Management Important Message (Signed)
Important Message  Patient Details  Name: [name] MRN: [id number] Date of Birth: 1950-10-02   Medicare Important Message Given:  Yes(given to nurse to deliver to patient due to contact precautions)     Tommy Medal 05/30/2019, 3:46 PM

## 2019-05-30 NOTE — Progress Notes (Addendum)
    Subjective: Abdominal cramping last night but none currently. No N/V. 2 watery stools with pieces overnight and 3 thus far this morning. Personally evaluated stool at bedside commode: watery with very few small pieces. States she did better on Saturday with a formed stool then back to watery now.   Objective: Vital signs in last 24 hours: Temp:  [97.5 F (36.4 C)-99 F (37.2 C)] 97.7 F (36.5 C) (08/31 0658) Pulse Rate:  [65-88] 65 (08/31 0658) Resp:  [16-18] 18 (08/31 0658) BP: (107-134)/(67-76) 134/76 (08/31 0658) SpO2:  [94 %-98 %] 95 % (08/31 0658) Last BM Date: 05/29/19 General:   Alert and oriented, pleasant Head:  Normocephalic and atraumatic.  Abdomen:  Bowel sounds present, soft, non-tender, non-distended. No HSM or hernias noted.  Extremities:  Without  edema. Neurologic:  Alert and  oriented x4 Psych:  Alert and cooperative. Normal mood and affect.  Intake/Output from previous day: 08/30 0701 - 08/31 0700 In: 2654.9 [P.O.:960; I.V.:1644.9; IV Piggyback:50] Out: -  Intake/Output this shift: No intake/output data recorded.  Lab Results: Recent Labs    05/30/19 0422  WBC 9.9  HGB 10.5*  HCT 33.8*  PLT 315   BMET Recent Labs    05/29/19 0609 05/30/19 0422  NA 140 141  K 2.7* 4.9  CL 107 110  CO2 28 25  GLUCOSE 114* 103*  BUN <5* <5*  CREATININE 0.81 0.73  CALCIUM 8.3* 8.1*    Assessment: 68 year old female with history of ulcerative pancolitis, presenting with Cdiff infection. GI pathogen panel negative. Day 8 of 14 for vancomycin; however, this was increased to 500 mg QID over the weekend (8/29). Colazal increased last week, with other supportive measures including Bentyl and Imodium dosing over the weekend. Improvement to softer stool over the weekend but now with watery stools. She notes since admission shse has had mild improvement, but diarrhea persists and challenging to manage in setting of known IBD.   Questran would be difficult to dose in  setting of multiple medications. Discussed with Dr. Manuella Ghazi. Could consider topical mesalamine therapy. Would complete course of vancomycin as previously outlined. If recurrence in future would use Dificid as first line.   Hypokalemia in setting of diarrhea: improved from yesterday and now normal. Continue supplementation as needed.   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 Use lowest dosage and frequency of PPI for control of symptoms.   Plan: Continue vancomycin 500 mg orally QID for total of 14 days Bentyl before meals Colazal TID Will review with Dr. Oneida Alar further options for management of persistent diarrhea in setting of UC    Annitta Needs, PhD, ANP-BC [facility]    LOS: 9 days    05/30/2019, 8:36 AM  Addendum: discussed with Dr. Oneida Alar. Plan for Flex sig with Propofol tomorrow. NPO after midnight except meds. Hold evening Eliquis today and tomorrow morning. Tap water enema today and repeat in the morning. Discussed with patient risks and benefits, who states understanding and agreeable to pursue.

## 2019-05-30 NOTE — Progress Notes (Signed)
Tap water enema given at this time. 750 mls infused.  Patient tolerated procedure well.

## 2019-05-30 NOTE — Progress Notes (Signed)
PROGRESS NOTE    [name]  EHM:[id number] DOB: 1950-10-20 DOA: 05/20/2019 PCP: Jalene Mullet, PA-C   Brief Narrative:  68 y.o.female,with history of ulcerative colitis recent diagnosis of ulcerative pancolitis in June 2020, followed by GI as outpatient, atrial fibrillation on anticoagulation with Eliquis, COPD came to hospital with complaints of abdominal pain and diarrhea. Patient says that she had multiple loose bowel movements. Also generalized abdominal pain. CT scan of the abdomen pelvis done in the ED shows diffuse pancolitis infectious versus inflammatory. She was recently seen by gastroenterology as outpatient. Denies vomiting but complains of nausea Denies chest pain or shortness of breath. Complains of fever,but has been afebrile in the hospital. Lab work showed WBC 13.4 Patient empirically started on Cipro and Flagyl.  8/27:She has since been transitioned to vancomycin after discovery of C. difficile pancolitis and is currently day5of 14. She continues to have ongoing loose bowel movements.  8/28:Patient continues to have ongoing frequent bowel movements, but they appear to be slightly more formed. She is currently on day 6 of 14 of vancomycin. Bentyl restarted by GI today.  8/29:Patient continues to have ongoing frequent bowel movements that are watery.  8/30: Patient has had improvement in frequency of bowel movements and has only had approximately 3 overnight, but they are still watery.  She is noted to be significantly hypokalemic this morning.  She has had midline placement yesterday with IV fluids and potassium supplementation already initiated.  Her vancomycin dose was increased by GI yesterday and loperamide added.  8/31: Patient continues to have frequent bowel movements, but potassium levels have improved this morning.  Assessment & Plan:   Active Problems:   Colitis   Enteritis due to Clostridium difficile   C. difficile  pancolitis-persistent -IV ciprofloxacin and Flagyl has been discontinued; patient started on vancomycin 125 mg every 6 hours, now to 500 mg every 6 hours on 8/29 and will continue treatment for a total of 14 days.Currentlyday9/14. -Given the still ongoing diarrhea-possibly related to UC flares -Patient educated and encouraged to follow a lactose-free diet. -Continue supportive care. -Anusol cream has been added to assist with rectal discomfort from multiple diarrhea events. -Bentyl restarted by Abelino Derrick 8/28 -Imodium added by GI 8/29 with some improvement -Discussed with GI this morning and may consider mesalamine enemas if needed  Hypokalemia- severe/hypomagnesemia-resolved -Secondary to ongoing bowel movements -Hold further repletion this morning and recheck labs in a.m.  history of ulcerative colitis  -Continue Colazalat higher dose per GI -Steroids contraindicated in the presence of acute infection -Follow GI service recommendations and supportive care.  chronic atrial fibrillation -Rate controlled -Continue metoprolol and Eliquis -No need for telemetry  COPD -Stable and compensated -Continue PRN albuterol -Continue Dulera  depression/anxiety -No suicidal ideation or hallucination -Continue home psychotropic medications.   DVT prophylaxis:Eliquis Code Status:Full Family Communication:Husband at bedside on 8/30 Disposition Plan:Continue oral vancomycin for C. difficile infection withColazol increased8/26per GI.Bentyl restarted on 8/20.  Imodium started 8/29.    Hold IV fluid today and check a.m. labs.  Appreciate further GI recommendations.   Consultants:  GI  Procedures:  None  Antimicrobials:  Anti-infectives (From admission, onward)   Start     Dose/Rate Route Frequency Ordered Stop   05/28/19 1800  vancomycin (VANCOCIN) 50 mg/mL oral solution 500 mg     500 mg Oral 4 times daily 05/28/19 1605 06/01/19 1359   05/22/19 1400   vancomycin (VANCOCIN) 50 mg/mL oral solution 125 mg  Status:  Discontinued  125 mg Oral 4 times daily 05/22/19 1233 05/28/19 1605   05/21/19 0800  ciprofloxacin (CIPRO) IVPB 400 mg  Status:  Discontinued     400 mg 200 mL/hr over 60 Minutes Intravenous Every 12 hours 05/21/19 0213 05/22/19 1233   05/21/19 0400  metroNIDAZOLE (FLAGYL) IVPB 500 mg  Status:  Discontinued     500 mg 100 mL/hr over 60 Minutes Intravenous Every 8 hours 05/21/19 0213 05/22/19 1233   05/20/19 2000  ciprofloxacin (CIPRO) tablet 500 mg  Status:  Discontinued     500 mg Oral  Once 05/20/19 1951 05/20/19 1952   05/20/19 2000  metroNIDAZOLE (FLAGYL) tablet 500 mg  Status:  Discontinued     500 mg Oral  Once 05/20/19 1951 05/20/19 1952   05/20/19 2000  ciprofloxacin (CIPRO) IVPB 400 mg     400 mg 200 mL/hr over 60 Minutes Intravenous  Once 05/20/19 1952 05/20/19 2147   05/20/19 2000  metroNIDAZOLE (FLAGYL) IVPB 500 mg     500 mg 100 mL/hr over 60 Minutes Intravenous  Once 05/20/19 1952 05/20/19 2148      Subjective: Patient seen and evaluated today with ongoing, frequent bowel movements.  She has apparently had 5 bowel movements in total, to include 3 this a.m.  She denies any abdominal cramping currently, but did have some last night.  Potassium has normalized.  Objective: Vitals:   05/29/19 2133 05/29/19 2136 05/29/19 2310 05/30/19 0658  BP: 107/67   134/76  Pulse: 69 83  65  Resp: 16  16 18   Temp: 99 F (37.2 C)   97.7 F (36.5 C)  TempSrc: Oral   Oral  SpO2: 94%  98% 95%  Weight:      Height:        Intake/Output Summary (Last 24 hours) at 05/30/2019 1019 Last data filed at 05/30/2019 0600 Gross per 24 hour  Intake 2414.88 ml  Output -  Net 2414.88 ml   Filed Weights   05/20/19 1506  Weight: 90.7 kg    Examination:  General exam: Appears calm and comfortable  Respiratory system: Clear to auscultation. Respiratory effort normal. Cardiovascular system: S1 & S2 heard, RRR. No JVD, murmurs,  rubs, gallops or clicks. No pedal edema. Gastrointestinal system: Abdomen is nondistended, soft and nontender. No organomegaly or masses felt. Normal bowel sounds heard. Central nervous system: Alert and oriented. No focal neurological deficits. Extremities: Symmetric 5 x 5 power. Skin: No rashes, lesions or ulcers Psychiatry: Judgement and insight appear normal. Mood & affect appropriate.     Data Reviewed: I have personally reviewed following labs and imaging studies  CBC: Recent Labs  Lab 05/24/19 0600 05/26/19 1104 05/27/19 0832 05/30/19 0422  WBC 8.3 7.8 8.9 9.9  HGB 11.3* 11.9* 11.7* 10.5*  HCT 36.0 34.7* 36.9 33.8*  MCV 94.5 88.1 93.9 95.5  PLT 212 222 240 353   Basic Metabolic Panel: Recent Labs  Lab 05/24/19 0600 05/25/19 0618 05/26/19 1104 05/26/19 1458 05/27/19 0832 05/29/19 0609 05/30/19 0422  NA 137 139 139  --  141 140 141  K 3.3* 2.9* 4.1  --  3.9 2.7* 4.9  CL 108 109 111  --  107 107 110  CO2 23 22 22   --  27 28 25   GLUCOSE 102* 93 164*  --  137* 114* 103*  BUN <5* <5* <5*  --  <5* <5* <5*  CREATININE 0.77 0.65 0.78  --  0.82 0.81 0.73  CALCIUM 8.0* 7.8* 8.5*  --  9.0  8.3* 8.1*  MG 2.1  --   --  1.7  --  1.5* 2.1   GFR: Estimated Creatinine Clearance: 72.4 mL/min (by C-G formula based on SCr of 0.73 mg/dL). Liver Function Tests: No results for input(s): AST, ALT, ALKPHOS, BILITOT, PROT, ALBUMIN in the last 168 hours. No results for input(s): LIPASE, AMYLASE in the last 168 hours. No results for input(s): AMMONIA in the last 168 hours. Coagulation Profile: No results for input(s): INR, PROTIME in the last 168 hours. Cardiac Enzymes: No results for input(s): CKTOTAL, CKMB, CKMBINDEX, TROPONINI in the last 168 hours. BNP (last 3 results) No results for input(s): PROBNP in the last 8760 hours. HbA1C: No results for input(s): HGBA1C in the last 72 hours. CBG: No results for input(s): GLUCAP in the last 168 hours. Lipid Profile: No results for  input(s): CHOL, HDL, LDLCALC, TRIG, CHOLHDL, LDLDIRECT in the last 72 hours. Thyroid Function Tests: No results for input(s): TSH, T4TOTAL, FREET4, T3FREE, THYROIDAB in the last 72 hours. Anemia Panel: No results for input(s): VITAMINB12, FOLATE, FERRITIN, TIBC, IRON, RETICCTPCT in the last 72 hours. Sepsis Labs: No results for input(s): PROCALCITON, LATICACIDVEN in the last 168 hours.  Recent Results (from the past 240 hour(s))  Urine culture     Status: None   Collection Time: 05/20/19  9:03 PM   Specimen: Urine, Clean Catch  Result Value Ref Range Status   Specimen Description   Final    URINE, CLEAN CATCH Performed at [facility], [address], [place], [place] [address]    Special Requests   Final    NONE Performed at [facility], [address] [place], [place] [address]    Culture   Final    Multiple bacterial morphotypes present, none predominant. Suggest appropriate recollection if clinically indicated.   Report Status 05/22/2019 FINAL  Final  SARS CORONAVIRUS 2 Nasal Swab Aptima Multi Swab     Status: None   Collection Time: 05/20/19  9:20 PM   Specimen: Aptima Multi Swab; Nasal Swab  Result Value Ref Range Status   SARS Coronavirus 2 NEGATIVE NEGATIVE Final    Comment: (NOTE) SARS-CoV-2 target nucleic acids are NOT DETECTED. The SARS-CoV-2 RNA is generally detectable in upper and lower respiratory specimens during the acute phase of infection. Negative results do not preclude SARS-CoV-2 infection, do not rule out co-infections with other pathogens, and should not be used as the sole basis for treatment or other patient management decisions. Negative results must be combined with clinical observations, patient history, and epidemiological information. The expected result is Negative. Fact Sheet for Patients: SugarRoll.be Fact Sheet for Healthcare Providers: https://www.woods-mathews.com/ This test is not yet approved or  cleared by the [place] FDA and  has been authorized for detection and/or diagnosis of SARS-CoV-2 by FDA under an Emergency Use Authorization (EUA). This EUA will remain  in effect (meaning this test can be used) for the duration of the COVID-19 declaration under Section 56 4(b)(1) of the Act, 21 U.S.C. section 360bbb-3(b)(1), unless the authorization is terminated or revoked sooner. Performed at [facility], [place] [address] [place],  [address]   SARS Coronavirus 2 [facility] order, Performed in [facility] hospital lab) Nasopharyngeal Nasopharyngeal Swab     Status: None   Collection Time: 05/21/19  8:16 AM   Specimen: Nasopharyngeal Swab  Result Value Ref Range Status   SARS Coronavirus 2 NEGATIVE NEGATIVE Final    Comment: (NOTE) If result is NEGATIVE SARS-CoV-2 target nucleic acids are NOT  DETECTED. The SARS-CoV-2 RNA is generally detectable in upper and lower  respiratory specimens during the acute phase of infection. The lowest  concentration of SARS-CoV-2 viral copies this assay can detect is 250  copies / mL. A negative result does not preclude SARS-CoV-2 infection  and should not be used as the sole basis for treatment or other  patient management decisions.  A negative result may occur with  improper specimen collection / handling, submission of specimen other  than nasopharyngeal swab, presence of viral mutation(s) within the  areas targeted by this assay, and inadequate number of viral copies  (<250 copies / mL). A negative result must be combined with clinical  observations, patient history, and epidemiological information. If result is POSITIVE SARS-CoV-2 target nucleic acids are DETECTED. The SARS-CoV-2 RNA is generally detectable in upper and lower  respiratory specimens dur ing the acute phase of infection.  Positive  results are indicative of active infection with SARS-CoV-2.  Clinical  correlation with patient history and other diagnostic  information is  necessary to determine patient infection status.  Positive results do  not rule out bacterial infection or co-infection with other viruses. If result is PRESUMPTIVE POSTIVE SARS-CoV-2 nucleic acids MAY BE PRESENT.   A presumptive positive result was obtained on the submitted specimen  and confirmed on repeat testing.  While 2019 novel coronavirus  (SARS-CoV-2) nucleic acids may be present in the submitted sample  additional confirmatory testing may be necessary for epidemiological  and / or clinical management purposes  to differentiate between  SARS-CoV-2 and other Sarbecovirus currently known to infect humans.  If clinically indicated additional testing with an alternate test  methodology [id number]) is advised. The SARS-CoV-2 RNA is generally  detectable in upper and lower respiratory sp ecimens during the acute  phase of infection. The expected result is Negative. Fact Sheet for Patients:  StrictlyIdeas.no Fact Sheet for Healthcare Providers: BankingDealers.co.za This test is not yet approved or cleared by the [place] FDA and has been authorized for detection and/or diagnosis of SARS-CoV-2 by FDA under an Emergency Use Authorization (EUA).  This EUA will remain in effect (meaning this test can be used) for the duration of the COVID-19 declaration under Section 564(b)(1) of the Act, 21 U.S.C. section 360bbb-3(b)(1), unless the authorization is terminated or revoked sooner. Performed at [facility], [address], [place], [place] [address]   Gastrointestinal Panel by PCR , Stool     Status: None   Collection Time: 05/22/19 10:30 AM   Specimen: Stool  Result Value Ref Range Status   Campylobacter species NOT DETECTED NOT DETECTED Final   Plesimonas shigelloides NOT DETECTED NOT DETECTED Final   Salmonella species NOT DETECTED NOT DETECTED Final   Yersinia enterocolitica NOT DETECTED NOT DETECTED Final   Vibrio  species NOT DETECTED NOT DETECTED Final   Vibrio cholerae NOT DETECTED NOT DETECTED Final   Enteroaggregative E coli (EAEC) NOT DETECTED NOT DETECTED Final   Enteropathogenic E coli (EPEC) NOT DETECTED NOT DETECTED Final   Enterotoxigenic E coli (ETEC) NOT DETECTED NOT DETECTED Final   Shiga like toxin producing E coli (STEC) NOT DETECTED NOT DETECTED Final   Shigella/Enteroinvasive E coli (EIEC) NOT DETECTED NOT DETECTED Final   Cryptosporidium NOT DETECTED NOT DETECTED Final   Cyclospora cayetanensis NOT DETECTED NOT DETECTED Final   Entamoeba histolytica NOT DETECTED NOT DETECTED Final   Giardia lamblia NOT DETECTED NOT DETECTED Final   Adenovirus F40/41 NOT DETECTED NOT DETECTED Final   Astrovirus NOT DETECTED  NOT DETECTED Final   Norovirus GI/GII NOT DETECTED NOT DETECTED Final   Rotavirus A NOT DETECTED NOT DETECTED Final   Sapovirus (I, II, IV, and V) NOT DETECTED NOT DETECTED Final    Comment: Performed at [facility], [address] [place], [place] [address]  C difficile quick scan w PCR reflex     Status: Abnormal   Collection Time: 05/22/19 10:30 AM   Specimen: Stool  Result Value Ref Range Status   C Diff antigen POSITIVE (A) NEGATIVE Final   C Diff toxin POSITIVE (A) NEGATIVE Final   C Diff interpretation Toxin producing C. difficile detected.  Final    Comment: CRITICAL RESULT CALLED TO, READ BACK BY AND VERIFIED WITH:  M.TAYLOR @ 8453 ON 8.23.20 BY LBB Performed at [facility], [address], [place], [place] [address]          Radiology Studies: No results found.      Scheduled Meds: . apixaban  5 mg Oral BID  . balsalazide  2,250 mg Oral TID  . buPROPion  150 mg Oral Daily  . busPIRone  7.5 mg Oral BID  . dicyclomine  10 mg Oral TID AC  . DULoxetine  120 mg Oral Daily  . famotidine  20 mg Oral BID AC  . hydrocortisone   Rectal BID  . ketotifen  1 drop Both Eyes Daily  . lidocaine  1 patch Transdermal Q24H  . loratadine  10 mg Oral  Daily  . metoprolol tartrate  12.5 mg Oral BID  . mometasone-formoterol  2 puff Inhalation BID  . pravastatin  20 mg Oral Daily  . pregabalin  100 mg Oral BID  . traZODone  100 mg Oral QHS  . vancomycin  500 mg Oral QID   Continuous Infusions:   LOS: 9 days    Time spent: 30 minutes    Constantino Starace Darleen Crocker, DO [facility] Pager [phone]  If 7PM-7AM, please contact night-coverage [email or web address] Password [facility] 05/30/2019, 10:19 AM

## 2019-05-31 ENCOUNTER — Encounter (HOSPITAL_COMMUNITY): Payer: Self-pay | Admitting: Anesthesiology

## 2019-05-31 ENCOUNTER — Encounter (HOSPITAL_COMMUNITY): Payer: Self-pay

## 2019-05-31 ENCOUNTER — Encounter (HOSPITAL_COMMUNITY)
Admission: EM | Disposition: A | Discharge status: Home / Self Care | Payer: Self-pay | Source: Home / Self Care | Attending: Internal Medicine

## 2019-05-31 LAB — MAGNESIUM: Magnesium: 1.9 mg/dL (ref 1.7–2.4)

## 2019-05-31 LAB — BASIC METABOLIC PANEL
Anion gap: 6 (ref 5–15)
BUN: <5 mg/dL — ABNORMAL LOW (ref 8–23)
CO2: 28 mmol/L (ref 22–32)
Calcium: 8.3 mg/dL — ABNORMAL LOW (ref 8.9–10.3)
Chloride: 106 mmol/L (ref 98–111)
Creatinine, Ser: 0.74 mg/dL (ref 0.44–1.00)
GFR calc Af Amer: >60 mL/min (ref >60)
GFR calc non Af Amer: >60 mL/min (ref >60)
Glucose, Bld: 103 mg/dL — ABNORMAL HIGH (ref 70–99)
Potassium: 3.9 mmol/L (ref 3.5–5.1)
Sodium: 140 mmol/L (ref 135–145)

## 2019-05-31 SURGERY — SIGMOIDOSCOPY, FLEXIBLE
Anesthesia: Monitor Anesthesia Care

## 2019-05-31 MED ORDER — APIXABAN 5 MG PO TABS
5.0000 mg | ORAL_TABLET | Freq: Two times a day (BID) | ORAL | Status: DC
  Administered 2019-05-31 – 2019-06-15 (×30): 5 mg via ORAL
  Filled 2019-05-31 (×30): qty 1

## 2019-05-31 MED ORDER — POTASSIUM CHLORIDE IN NACL 40-0.9 MEQ/L-% IV SOLN
INTRAVENOUS | Status: DC
  Administered 2019-05-31 – 2019-06-04 (×5): 50 mL/h via INTRAVENOUS
  Administered 2019-06-06 – 2019-06-08 (×6): 125 mL/h via INTRAVENOUS

## 2019-05-31 MED ORDER — METOPROLOL TARTRATE 25 MG PO TABS
25.0000 mg | ORAL_TABLET | Freq: Two times a day (BID) | ORAL | Status: DC
  Administered 2019-05-31 – 2019-06-12 (×24): 25 mg via ORAL
  Filled 2019-05-31 (×24): qty 1

## 2019-05-31 MED ORDER — SODIUM CHLORIDE 0.45 % IV SOLN
INTRAVENOUS | Status: DC
  Administered 2019-05-31: 12:00:00 via INTRAVENOUS
  Filled 2019-05-31 (×5): qty 1000

## 2019-05-31 MED ORDER — SODIUM CHLORIDE 0.9 % IV SOLN: INTRAVENOUS | Status: DC

## 2019-05-31 NOTE — OR Nursing (Signed)
Report called to patient nurse  Brandi from 300.  Informed her the case was cancelled .  Informed that Dr. Hilaria Ota and Dr. Oneida Alar are requesting patient to be placed on monitor. Dr. Oneida Alar and DR. Hilaria Ota spoke to patient. Patient is aware why procedure was cancelled.

## 2019-05-31 NOTE — Progress Notes (Signed)
PROGRESS NOTE    [name]  YFV:[id number] DOB: 1950/11/19 DOA: 05/20/2019 PCP: Jalene Mullet, PA-C   Brief Narrative:  68 y.o.female,with history of ulcerative colitis recent diagnosis of ulcerative pancolitis in June 2020, followed by GI as outpatient, atrial fibrillation on anticoagulation with Eliquis, COPD came to hospital with complaints of abdominal pain and diarrhea. Patient says that she had multiple loose bowel movements. Also generalized abdominal pain. CT scan of the abdomen pelvis done in the ED shows diffuse pancolitis infectious versus inflammatory. She was recently seen by gastroenterology as outpatient. Denies vomiting but complains of nausea Denies chest pain or shortness of breath. Complains of fever,but has been afebrile in the hospital. Lab work showed WBC 13.4 Patient empirically started on Cipro and Flagyl.  8/27:She has since been transitioned to vancomycin after discovery of C. difficile pancolitis and is currently day5of 14. She continues to have ongoing loose bowel movements.  8/28:Patient continues to have ongoing frequent bowel movements, but they appear to be slightly more formed. She is currently on day 6 of 14 of vancomycin. Bentyl restarted by GI today.  8/29:Patient continues to have ongoing frequent bowel movements that are watery.  8/30:Patient has had improvement in frequency of bowel movements and has only had approximately 3 overnight, but they are still watery. She is noted to be significantly hypokalemic this morning. She has had midline placement yesterday with IV fluids and potassium supplementation already initiated. Her vancomycin dose was increased by GI yesterday and loperamide added.  8/31: Patient continues to have frequent bowel movements, but potassium levels have improved this morning.  9/1: Potassium levels are beginning to downtrend once again and will resume IV fluid with some supplementation and recheck  labs in a.m.  Unfortunately, patient continues to have frequent bowel movements still, but this is also in the setting of enema that was given last night.  She was to undergo flex sigmoidoscopy this a.m. and unfortunately could not have the procedure done on account of some transient atrial fibrillation with RVR.  She promptly returned to sinus rhythm once in her room and EKG confirmed this.   Assessment & Plan:   Active Problems:   Colitis   Enteritis due to Clostridium difficile   C. difficile pancolitis-persistent -IV ciprofloxacin and Flagyl has been discontinued; patient started on vancomycin 125 mg every 6 hours, now to 500 mg every 6 hours on 8/29and will continue treatment for a total of 14 days.Currentlyday10/14. -Given the still ongoing diarrhea-possibly related to UC flares -Patient educated and encouraged to follow a lactose-free diet. -Continue supportive care. -Anusol cream has been added to assist with rectal discomfort from multiple diarrhea events. -Bentyl restarted by Abelino Derrick 8/28 -Imodium added by GI 8/29 with some improvement -Unfortunately, patient could not have flex sigmoidoscopy today on account of brief tachyarrhythmia  Transient symptomatic tachyarrhythmia-suspect A. fib with RVR -Unfortunately, no rhythm strips or EKG captured this event, but as per anesthesiologist -She probably returned to a normal sinus rhythm once on the floor.  Telemetry monitoring and EKG confirmed this.  No new changes noted. -Aside from palpitations, no chest pain or other symptomatology noted. -We will plan to increase metoprolol dose to 25 mg twice daily for now and monitor carefully on telemetry  Hypokalemia-severe/hypomagnesemia-resolved -Secondary to ongoing bowel movements -Continue IV fluid with potassium repletion and recheck labs in a.m.  history of ulcerative colitis  -Continue Colazalat higher dose per GI -Steroids contraindicated in the presence of acute infection  -Follow GI service recommendations and  supportive care.  chronic atrial fibrillation -Currently in sinus rhythm -Increased dose of metoprolol to 25 twice daily as noted above -Continue Eliquis for anticoagulation  COPD -Stable and compensated -Continue PRN albuterol -Continue Dulera  depression/anxiety -No suicidal ideation or hallucination -Continue home psychotropic medications.   DVT prophylaxis:Eliquis Code Status:Full Family Communication:Husband at bedside on 9/1 Disposition Plan:Continue oral vancomycin for C. difficile infection withColazol increased8/26per GI.Bentyl restarted on 8/20.Imodium started 8/29.Continue IV fluid and potassium supplementation.  Recheck labs in a.m.  Metoprolol dose increased to 25 mg twice daily on account of brief tachyarrhythmia.  Monitor on telemetry.  Could likely reconsider flex sigmoidoscopy in a.m. per GI if rates remain controlled.   Consultants:  GI  Procedures:  None  Antimicrobials:  Anti-infectives (From admission, onward)   Start     Dose/Rate Route Frequency Ordered Stop   05/28/19 1800  vancomycin (VANCOCIN) 50 mg/mL oral solution 500 mg     500 mg Oral 4 times daily 05/28/19 1605 06/01/19 1359   05/22/19 1400  vancomycin (VANCOCIN) 50 mg/mL oral solution 125 mg  Status:  Discontinued     125 mg Oral 4 times daily 05/22/19 1233 05/28/19 1605   05/21/19 0800  ciprofloxacin (CIPRO) IVPB 400 mg  Status:  Discontinued     400 mg 200 mL/hr over 60 Minutes Intravenous Every 12 hours 05/21/19 0213 05/22/19 1233   05/21/19 0400  metroNIDAZOLE (FLAGYL) IVPB 500 mg  Status:  Discontinued     500 mg 100 mL/hr over 60 Minutes Intravenous Every 8 hours 05/21/19 0213 05/22/19 1233   05/20/19 2000  ciprofloxacin (CIPRO) tablet 500 mg  Status:  Discontinued     500 mg Oral  Once 05/20/19 1951 05/20/19 1952   05/20/19 2000  metroNIDAZOLE (FLAGYL) tablet 500 mg  Status:  Discontinued     500 mg Oral  Once 05/20/19  1951 05/20/19 1952   05/20/19 2000  ciprofloxacin (CIPRO) IVPB 400 mg     400 mg 200 mL/hr over 60 Minutes Intravenous  Once 05/20/19 1952 05/20/19 2147   05/20/19 2000  metroNIDAZOLE (FLAGYL) IVPB 500 mg     500 mg 100 mL/hr over 60 Minutes Intravenous  Once 05/20/19 1952 05/20/19 2148       Subjective: Patient seen and evaluated today with ongoing, frequent, watery bowel movements.  She apparently had 5 bowel movements overnight, but this is also in the setting of enema use for preparation for flexible endoscopy.  Unfortunately, she had a brief episode of tachyarrhythmia and palpitations that prompted cancellation of the procedure today.  Objective: Vitals:   05/30/19 2007 05/30/19 2151 05/31/19 0553 05/31/19 0808  BP:  133/78 139/73   Pulse:  70 75   Resp:  16 16   Temp:  98.9 F (37.2 C) 98.8 F (37.1 C)   TempSrc:  Oral Oral   SpO2: 94% 95% 96% 96%  Weight:      Height:       No intake or output data in the 24 hours ending 05/31/19 1409 Filed Weights   05/20/19 1506  Weight: 90.7 kg    Examination:  General exam: Appears calm and comfortable  Respiratory system: Clear to auscultation. Respiratory effort normal. Cardiovascular system: S1 & S2 heard, RRR. No JVD, murmurs, rubs, gallops or clicks. No pedal edema. Gastrointestinal system: Abdomen is nondistended, soft and nontender. No organomegaly or masses felt. Normal bowel sounds heard. Central nervous system: Alert and oriented. No focal neurological deficits. Extremities: Symmetric 5 x 5 power. Skin:  No rashes, lesions or ulcers Psychiatry: Judgement and insight appear normal. Mood & affect appropriate.     Data Reviewed: I have personally reviewed following labs and imaging studies  CBC: Recent Labs  Lab 05/26/19 1104 05/27/19 0832 05/30/19 0422  WBC 7.8 8.9 9.9  HGB 11.9* 11.7* 10.5*  HCT 34.7* 36.9 33.8*  MCV 88.1 93.9 95.5  PLT 222 240 751   Basic Metabolic Panel: Recent Labs  Lab 05/26/19  1104 05/26/19 1458 05/27/19 0832 05/29/19 0609 05/30/19 0422 05/31/19 0445  NA 139  --  141 140 141 140  K 4.1  --  3.9 2.7* 4.9 3.9  CL 111  --  107 107 110 106  CO2 22  --  27 28 25 28   GLUCOSE 164*  --  137* 114* 103* 103*  BUN <5*  --  <5* <5* <5* <5*  CREATININE 0.78  --  0.82 0.81 0.73 0.74  CALCIUM 8.5*  --  9.0 8.3* 8.1* 8.3*  MG  --  1.7  --  1.5* 2.1 1.9   GFR: Estimated Creatinine Clearance: 72.4 mL/min (by C-G formula based on SCr of 0.74 mg/dL). Liver Function Tests: No results for input(s): AST, ALT, ALKPHOS, BILITOT, PROT, ALBUMIN in the last 168 hours. No results for input(s): LIPASE, AMYLASE in the last 168 hours. No results for input(s): AMMONIA in the last 168 hours. Coagulation Profile: No results for input(s): INR, PROTIME in the last 168 hours. Cardiac Enzymes: No results for input(s): CKTOTAL, CKMB, CKMBINDEX, TROPONINI in the last 168 hours. BNP (last 3 results) No results for input(s): PROBNP in the last 8760 hours. HbA1C: No results for input(s): HGBA1C in the last 72 hours. CBG: No results for input(s): GLUCAP in the last 168 hours. Lipid Profile: No results for input(s): CHOL, HDL, LDLCALC, TRIG, CHOLHDL, LDLDIRECT in the last 72 hours. Thyroid Function Tests: No results for input(s): TSH, T4TOTAL, FREET4, T3FREE, THYROIDAB in the last 72 hours. Anemia Panel: No results for input(s): VITAMINB12, FOLATE, FERRITIN, TIBC, IRON, RETICCTPCT in the last 72 hours. Sepsis Labs: No results for input(s): PROCALCITON, LATICACIDVEN in the last 168 hours.  Recent Results (from the past 240 hour(s))  Gastrointestinal Panel by PCR , Stool     Status: None   Collection Time: 05/22/19 10:30 AM   Specimen: Stool  Result Value Ref Range Status   Campylobacter species NOT DETECTED NOT DETECTED Final   Plesimonas shigelloides NOT DETECTED NOT DETECTED Final   Salmonella species NOT DETECTED NOT DETECTED Final   Yersinia enterocolitica NOT DETECTED NOT DETECTED  Final   Vibrio species NOT DETECTED NOT DETECTED Final   Vibrio cholerae NOT DETECTED NOT DETECTED Final   Enteroaggregative E coli (EAEC) NOT DETECTED NOT DETECTED Final   Enteropathogenic E coli (EPEC) NOT DETECTED NOT DETECTED Final   Enterotoxigenic E coli (ETEC) NOT DETECTED NOT DETECTED Final   Shiga like toxin producing E coli (STEC) NOT DETECTED NOT DETECTED Final   Shigella/Enteroinvasive E coli (EIEC) NOT DETECTED NOT DETECTED Final   Cryptosporidium NOT DETECTED NOT DETECTED Final   Cyclospora cayetanensis NOT DETECTED NOT DETECTED Final   Entamoeba histolytica NOT DETECTED NOT DETECTED Final   Giardia lamblia NOT DETECTED NOT DETECTED Final   Adenovirus F40/41 NOT DETECTED NOT DETECTED Final   Astrovirus NOT DETECTED NOT DETECTED Final   Norovirus GI/GII NOT DETECTED NOT DETECTED Final   Rotavirus A NOT DETECTED NOT DETECTED Final   Sapovirus (I, II, IV, and V) NOT DETECTED NOT DETECTED Final  Comment: Performed at [facility], [address] [place], [place] [address]  C difficile quick scan w PCR reflex     Status: Abnormal   Collection Time: 05/22/19 10:30 AM   Specimen: Stool  Result Value Ref Range Status   C Diff antigen POSITIVE (A) NEGATIVE Final   C Diff toxin POSITIVE (A) NEGATIVE Final   C Diff interpretation Toxin producing C. difficile detected.  Final    Comment: CRITICAL RESULT CALLED TO, READ BACK BY AND VERIFIED WITH:  M.TAYLOR @ 9741 ON 8.23.20 BY LBB Performed at [facility], [address] [place], [place] [address]          Radiology Studies: No results found.      Scheduled Meds: . apixaban  5 mg Oral BID  . balsalazide  2,250 mg Oral TID  . buPROPion  150 mg Oral Daily  . busPIRone  7.5 mg Oral BID  . dicyclomine  10 mg Oral TID AC  . DULoxetine  120 mg Oral Daily  . famotidine  20 mg Oral BID AC  . ketotifen  1 drop Both Eyes Daily  . lidocaine  1 patch Transdermal Q24H  . loratadine  10 mg Oral Daily  .  metoprolol tartrate  25 mg Oral BID  . mometasone-formoterol  2 puff Inhalation BID  . pravastatin  20 mg Oral Daily  . pregabalin  100 mg Oral BID  . traZODone  100 mg Oral QHS  . vancomycin  500 mg Oral QID   Continuous Infusions: . 0.9 % NaCl with KCl 40 mEq / L    . sodium chloride 0.45 % with kcl 75 mL/hr at 05/31/19 1157     LOS: 10 days    Time spent: 30 minutes    Jaisean Monteforte Darleen Crocker, DO [facility] Pager [phone]  If 7PM-7AM, please contact night-coverage [email or web address] Password [email or web address] 05/31/2019, 2:09 PM

## 2019-05-31 NOTE — Progress Notes (Signed)
Per Dr. Oneida Alar, I have placed order for flex sig tomorrow with Dr. Gala Romney. Patient ok to continue Eliquis per Dr. Oneida Alar. Spoke with endoscopy scheduler. She requested I place the order and she will get patient added on.

## 2019-05-31 NOTE — Progress Notes (Addendum)
Patient is without any abdominal pain, nausea, or vomiting. She continues to have frequent diarrhea. Stools are still watery with small pieces. Received 1 enema yesterday and nurse was about to give second enema when I was in the room. Eliquis has been on hold since yesterday evening. Last dose at 10:50am on 8/31. BMP with sodium and potassium within normal limits. Proceed with flexible sigmoidoscopy and possible colonoscopy today with Dr. Oneida Alar as scheduled.   0958: discussed with [facility]. [facility]. PT RETURNED TO FLOOR AFTER ENDOSCOPY CANCELED BY DR. Hilaria Ota DUE TO HEART RATE IN 140-150s. DR. Hilaria Ota REPORTS PT IN AFIB. I WENT TO [facility] 2 TO SEE PT BUT SHE WAS ALREADY TRANSFERRED BACK TO THE FLOOR.

## 2019-05-31 NOTE — Progress Notes (Addendum)
HR 140's-150's Dr. Jamey Ripa aware and cancelling elective procedure. Pt feels like her heart is quivering and feels a little weak. Charge nurse also aware.   Paula Baldonado [name], RN

## 2019-05-31 NOTE — Plan of Care (Signed)
Pt placed on tele monitor, rate is in the 70's. No complaints of chest pain or palpitations. EKG is progress.

## 2019-05-31 NOTE — Progress Notes (Signed)
Notified pt's RN of HR of 161

## 2019-05-31 NOTE — Progress Notes (Signed)
Subjective: Patient is feeling well at this time. She just finished a regular lunch tray. Heart rate back down to normal. States she could feel a little flutter in her heart, but that was the only symptom she had. She admits to some lower abdominal pain at this time. Reports 8/10 although she is resting comfortably. States her abdominal pain is much improved since her admission. It just comes and goes at this time. Reports about 5 BMs last night, but this is in the setting of an enema yesterday evening. Reports the frequency of stools has improved but the consistency has not. Stools continue to be watery with a few small pieces. States prior to admission, she had been having intermittent diarrhea for months. Reports after taking Vancomycin, she will be on the toilet shortly after. Wonders if medication is causing her to have diarrhea. Admits to blood in the stool since 05/12/19. Reports bright red blood with every BM. States there was an increase in the amount last night. Denies nausea, vomiting. Denies any upper GI symptoms at this time.     Objective: Vital signs in last 24 hours: Temp:  [98.2 F (36.8 C)-98.9 F (37.2 C)] 98.8 F (37.1 C) (09/01 0553) Pulse Rate:  [70-75] 75 (09/01 0553) Resp:  [16-18] 16 (09/01 0553) BP: (133-146)/(73-79) 139/73 (09/01 0553) SpO2:  [94 %-97 %] 96 % (09/01 0808) Last BM Date: 05/30/19 General:   Alert and oriented, pleasant Head:  Normocephalic and atraumatic..  Abdomen:  Bowel sounds present, soft, non-tender, non-distended. No HSM or hernias noted. No rebound or guarding. No masses appreciated  Extremities:  Without edema. Neurologic:  Alert and  oriented x4;  grossly normal neurologically. Psych:  Alert and cooperative. Normal mood and affect.  Intake/Output from previous day: 08/31 0701 - 09/01 0700 In: 480 [P.O.:480] Out: -   Lab Results: Recent Labs    05/30/19 0422  WBC 9.9  HGB 10.5*  HCT 33.8*  PLT 315   BMET Recent Labs   05/29/19 0609 05/30/19 0422 05/31/19 0445  NA 140 141 140  K 2.7* 4.9 3.9  CL 107 110 106  CO2 28 25 28   GLUCOSE 114* 103* 103*  BUN <5* <5* <5*  CREATININE 0.81 0.73 0.74  CALCIUM 8.3* 8.1* 8.3*   Assessment: 68 year old female with history of ulcerative pancolitis, presenting with Cdiff infection. GI pathogen panelnegative. Day 10 of Vancomycin. Originally on 125 QID from 8/23-8/29. Vanc was increased to 500 QID on 05/28/19 with last dose scheduled for 06/01/19. Colazal increased last week, with other supportive measures including Bentyl and Imodium dosing over the weekend. Since admission patient reports some improvement in stool frequency, but not consistency. Continues with several BMs daily although this is somewhat difficult to evaluate over the last 24 hours as patient had 2 enemas in preparation for sigmoidoscopy today with possible colonoscopy today. Also noting hematochezia since 05/12/19. Sigmoidoscopy was canceled due to HR in the 140s-150s. This has now resolved and HR on monitor at time of seeing patient this afternoon was 74. Eliquis has been restarted and patient placed back on regular diet.   Diarrhea has been difficult to manage in the setting of IBD.  As abdominal pain is improving and stool frequency is improving, I expect that C. diff is resolving. Patient needs to complete 14 day course of Vancomycin as planned. Query whether patient is also having an IBD flare in the setting of hematochezia and on going diarrhea. Hemoglobin 10.5 yesterday. This is likely dilutional as  patient received fluids on 8/30. Continue Colazal TID for UC. Patient has allergy to mesalamine. Would not use prednisone in the setting of C. diff. She likely needs flex sig for further evaluation of possible UC flare. Will discuss further options for management with Dr. Oneida Alar.    Hypokalemia in setting of diarrhea. Potassium dropped from 4.9 on 8/31 to 3.9 today. Continue supplementation as needed.   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 Use lowest dosage and frequency of PPI for control of symptoms.  Plan: Continue vancomycin 500 mg orally QID for total of 14 days of Vancomycin. Will need to modify order as last dose is scheduled tomorrow. Last dose should be the morning of 06/05/19.   Continue Colazal TID Continue Bentyl before meals Will review with Dr. Oneida Alar for further management options.  Reassess in the morning.    LOS: 10 days    05/31/2019, 12:49 PM   Aliene Altes, [facility]

## 2019-06-01 ENCOUNTER — Encounter (HOSPITAL_COMMUNITY)
Admission: EM | Disposition: A | Discharge status: Home / Self Care | Payer: Self-pay | Source: Home / Self Care | Attending: Internal Medicine

## 2019-06-01 ENCOUNTER — Encounter (HOSPITAL_COMMUNITY): Payer: Self-pay | Admitting: *Deleted

## 2019-06-01 ENCOUNTER — Other Ambulatory Visit: Payer: Self-pay

## 2019-06-01 DIAGNOSIS — K5190 Ulcerative colitis, unspecified, without complications: Secondary | ICD-10-CM

## 2019-06-01 DIAGNOSIS — R000 Tachycardia, unspecified: Secondary | ICD-10-CM

## 2019-06-01 HISTORY — PX: BIOPSY: SHX5522

## 2019-06-01 HISTORY — PX: FLEXIBLE SIGMOIDOSCOPY: SHX5431

## 2019-06-01 LAB — CBC
HCT: 34.2 % — ABNORMAL LOW (ref 36.0–46.0)
Hemoglobin: 10.8 g/dL — ABNORMAL LOW (ref 12.0–15.0)
MCH: 30.2 pg (ref 26.0–34.0)
MCHC: 31.6 g/dL (ref 30.0–36.0)
MCV: 95.5 fL (ref 80.0–100.0)
Platelets: 264 10*3/uL (ref 150–400)
RBC: 3.58 MIL/uL — ABNORMAL LOW (ref 3.87–5.11)
RDW: 15.7 % — ABNORMAL HIGH (ref 11.5–15.5)
WBC: 8.1 10*3/uL (ref 4.0–10.5)
nRBC: 0 % (ref 0.0–0.2)

## 2019-06-01 LAB — BASIC METABOLIC PANEL
BUN: <5 mg/dL — ABNORMAL LOW (ref 8–23)
CO2: 28 mmol/L (ref 22–32)
Calcium: 7.9 mg/dL — ABNORMAL LOW (ref 8.9–10.3)
Chloride: 111 mmol/L (ref 98–111)
Creatinine, Ser: 0.78 mg/dL (ref 0.44–1.00)
GFR calc Af Amer: >60 mL/min (ref >60)
GFR calc non Af Amer: >60 mL/min (ref >60)
Glucose, Bld: 97 mg/dL (ref 70–99)
Potassium: 3.7 mmol/L (ref 3.5–5.1)
Sodium: 141 mmol/L (ref 135–145)

## 2019-06-01 LAB — MAGNESIUM: Magnesium: 2 mg/dL (ref 1.7–2.4)

## 2019-06-01 SURGERY — SIGMOIDOSCOPY, FLEXIBLE
Anesthesia: Moderate Sedation

## 2019-06-01 MED ORDER — MEPERIDINE HCL 50 MG/ML IJ SOLN
INTRAMUSCULAR | Status: AC
  Filled 2019-06-01: qty 1

## 2019-06-01 MED ORDER — MIDAZOLAM HCL 5 MG/5ML IJ SOLN
INTRAMUSCULAR | Status: DC | PRN
  Administered 2019-06-01: 1 mg via INTRAVENOUS
  Administered 2019-06-01: 2 mg via INTRAVENOUS

## 2019-06-01 MED ORDER — MIDAZOLAM HCL 5 MG/5ML IJ SOLN
INTRAMUSCULAR | Status: AC
  Filled 2019-06-01: qty 10

## 2019-06-01 MED ORDER — ONDANSETRON HCL 4 MG/2ML IJ SOLN
INTRAMUSCULAR | Status: DC | PRN
  Administered 2019-06-01: 4 mg via INTRAVENOUS

## 2019-06-01 MED ORDER — ONDANSETRON HCL 4 MG/2ML IJ SOLN
INTRAMUSCULAR | Status: AC
  Filled 2019-06-01: qty 2

## 2019-06-01 MED ORDER — STERILE WATER FOR IRRIGATION IR SOLN
Status: DC | PRN
  Administered 2019-06-01: 14:00:00 1.5 mL

## 2019-06-01 MED ORDER — MEPERIDINE HCL 100 MG/ML IJ SOLN
INTRAMUSCULAR | Status: DC | PRN
  Administered 2019-06-01: 25 mg

## 2019-06-01 MED ORDER — BUDESONIDE 3 MG PO CPEP
9.0000 mg | ORAL_CAPSULE | Freq: Every day | ORAL | Status: DC
  Administered 2019-06-01 – 2019-06-10 (×10): 9 mg via ORAL
  Filled 2019-06-01 (×11): qty 3

## 2019-06-01 MED ORDER — SODIUM CHLORIDE 0.9 % IV SOLN
INTRAVENOUS | Status: DC
  Administered 2019-06-01: 13:00:00 via INTRAVENOUS

## 2019-06-01 MED ORDER — CHOLESTYRAMINE LIGHT 4 G PO PACK
2.0000 g | PACK | Freq: Every day | ORAL | Status: DC
  Administered 2019-06-02 – 2019-06-03 (×2): 2 g via ORAL
  Filled 2019-06-01 (×5): qty 1

## 2019-06-01 NOTE — Progress Notes (Signed)
PROGRESS NOTE    [name]  WER:[id number]  DOB: 10-05-1950  DOA: 05/20/2019 PCP: Jalene Mullet, PA-C   Brief Admission Hx: 68 year old female with recent diagnosis of ulcerative colitis presented with C. difficile positive diarrhea, abdominal pain and diffuse pancolitis, followed closely by GI service.  MDM/Assessment & Plan:   1. C. difficile pancolitis-symptoms persist she has been treated with Cipro and Flagyl which has been discontinued and now being treated with oral vancomycin with plan to treat for total of 14 days, now day 11/14.  2. Ulcerative pancolitis -GI planning flex sig colonoscopy later today and further recommendations pending findings. 3. Transient tachyarrhythmia- patient had some transient A. fib noted yesterday however has been back in sinus rhythm.  No recurrence noted.  Increased dose of metoprolol from 05/31/2019 is being tolerated 25 mg twice daily continue to follow. 4. Hypokalemia-secondary to ongoing GI losses-continue to replete and follow magnesium. 5. Chronic atrial fibrillation- metoprolol increased to 25 mg twice daily and seems controlled, apixaban for full anticoagulation. 6. COPD-continue bronchodilators seems stable at this time. 7. Anxiety/depression- resume home medications.  Stable.  DVT prophylaxis: Apixaban Code Status: Full Family Communication: Husband Disposition Plan: Continue inpatient treatments   Consultants:  GI  Procedures:  Flex sig 06/01/2019  Antimicrobials:  Oral vancomycin  Subjective: Patient says she continues to have diarrhea that is unchanged and abdominal pain  Objective: Vitals:   06/01/19 0624 06/01/19 0722 06/01/19 1143 06/01/19 1238  BP: 124/86  (!) 130/96 (!) 117/98  Pulse: 62  75 73  Resp: 16   18  Temp: 98 F (36.7 C)   98.8 F (37.1 C)  TempSrc: Oral   Oral  SpO2: 94% 95%  95%  Weight:      Height:        Intake/Output Summary (Last 24 hours) at 06/01/2019 1257 Last data filed at 05/31/2019  1800 Gross per 24 hour  Intake 582.64 ml  Output -  Net 582.64 ml   Filed Weights   05/20/19 1506  Weight: 90.7 kg     REVIEW OF SYSTEMS  As per history otherwise all reviewed and reported negative  Exam:  General exam: Awake, alert, no apparent distress, cooperative and pleasant. Respiratory system: Clear. No increased work of breathing. Cardiovascular system: S1 & S2 heard. No JVD, murmurs, gallops, clicks or pedal edema. Gastrointestinal system: Abdomen is nondistended, soft and diffuse nonspecific tenderness. Normal bowel sounds heard. Central nervous system: Alert and oriented. No focal neurological deficits. Extremities: no CCE.  Data Reviewed: Basic Metabolic Panel: Recent Labs  Lab 05/26/19 1458 05/27/19 1950 05/29/19 0609 05/30/19 0422 05/31/19 0445 06/01/19 0409  NA  --  141 140 141 140 141  K  --  3.9 2.7* 4.9 3.9 3.7  CL  --  107 107 110 106 111  CO2  --  27 28 25 28 28   GLUCOSE  --  137* 114* 103* 103* 97  BUN  --  <5* <5* <5* <5* <5*  CREATININE  --  0.82 0.81 0.73 0.74 0.78  CALCIUM  --  9.0 8.3* 8.1* 8.3* 7.9*  MG 1.7  --  1.5* 2.1 1.9 2.0   Liver Function Tests: No results for input(s): AST, ALT, ALKPHOS, BILITOT, PROT, ALBUMIN in the last 168 hours. No results for input(s): LIPASE, AMYLASE in the last 168 hours. No results for input(s): AMMONIA in the last 168 hours. CBC: Recent Labs  Lab 05/26/19 1104 05/27/19 0832 05/30/19 0422 06/01/19 0409  WBC 7.8 8.9  9.9 8.1  HGB 11.9* 11.7* 10.5* 10.8*  HCT 34.7* 36.9 33.8* 34.2*  MCV 88.1 93.9 95.5 95.5  PLT 222 240 315 264   Cardiac Enzymes: No results for input(s): CKTOTAL, CKMB, CKMBINDEX, TROPONINI in the last 168 hours. CBG (last 3)  No results for input(s): GLUCAP in the last 72 hours. No results found for this or any previous visit (from the past 240 hour(s)).   Studies: No results found.   Scheduled Meds: . [MAR Hold] apixaban  5 mg Oral BID  . [MAR Hold] balsalazide  2,250 mg  Oral TID  . [MAR Hold] buPROPion  150 mg Oral Daily  . [MAR Hold] busPIRone  7.5 mg Oral BID  . [MAR Hold] dicyclomine  10 mg Oral TID AC  . [MAR Hold] DULoxetine  120 mg Oral Daily  . [MAR Hold] famotidine  20 mg Oral BID AC  . [MAR Hold] ketotifen  1 drop Both Eyes Daily  . [MAR Hold] lidocaine  1 patch Transdermal Q24H  . [MAR Hold] loratadine  10 mg Oral Daily  . meperidine      . [MAR Hold] metoprolol tartrate  25 mg Oral BID  . midazolam      . [MAR Hold] mometasone-formoterol  2 puff Inhalation BID  . ondansetron      . [MAR Hold] pravastatin  20 mg Oral Daily  . [MAR Hold] pregabalin  100 mg Oral BID  . [MAR Hold] traZODone  100 mg Oral QHS  . [MAR Hold] vancomycin  500 mg Oral QID   Continuous Infusions: . sodium chloride 20 mL/hr at 06/01/19 1236  . 0.9 % NaCl with KCl 40 mEq / L 50 mL/hr at 05/31/19 1700    Active Problems:   Colitis   Enteritis due to Clostridium difficile   Time spent:   Irwin Brakeman, MD [facility] 06/01/2019, 12:57 PM    LOS: 11 days  How to contact the [facility] Attending or Consulting provider [place] or covering provider during after hours [place], for this patient?  1. Check the care team in [facility] and look for a) attending/consulting [facility] provider listed and b) the [facility] team listed 2. Log into [email or web address] and use [place]'s universal password to access. If you do not have the password, please contact the hospital operator. 3. Locate the [facility] provider you are looking for under [facility] and page to a number that you can be directly reached. 4. If you still have difficulty reaching the provider, please page the [facility] (Director on Call) for the Hospitalists listed on amion for assistance.

## 2019-06-01 NOTE — Progress Notes (Signed)
    Subjective: 4 watery stools overnight, noting blood with BMs consistently. Watery and mucus. Feels frequency is not improving nor consistency. No abdominal pain, no N/V. Hungry but doesn't want to eat due to postprandial urgency. Heart rate in the mid 60s, controlled. Has been NPO since midnight.   Objective: Vital signs in last 24 hours: Temp:  [98 F (36.7 C)-98.8 F (37.1 C)] 98 F (36.7 C) (09/02 0624) Pulse Rate:  [62-89] 62 (09/02 0624) Resp:  [16-18] 16 (09/02 0624) BP: (112-124)/(68-86) 124/86 (09/02 0624) SpO2:  [94 %-99 %] 95 % (09/02 0722) Last BM Date: 05/30/19 General:   Alert and oriented, pleasant Head:  Normocephalic and atraumatic. Abdomen:  Bowel sounds present, soft, non-tender, non-distended. Neurologic:  Alert and  oriented x4  Intake/Output from previous day: 09/01 0701 - 09/02 0700 In: 582.6 [P.O.:240; I.V.:342.6] Out: -  Intake/Output this shift: No intake/output data recorded.  Lab Results: Recent Labs    05/30/19 0422 06/01/19 0409  WBC 9.9 8.1  HGB 10.5* 10.8*  HCT 33.8* 34.2*  PLT 315 264   BMET Recent Labs    05/30/19 0422 05/31/19 0445 06/01/19 0409  NA 141 140 141  K 4.9 3.9 3.7  CL 110 106 111  CO2 25 28 28   GLUCOSE 103* 103* 97  BUN <5* <5* <5*  CREATININE 0.73 0.74 0.78  CALCIUM 8.1* 8.3* 7.9*    Assessment: 68 year old female with history of pan ulcerative colitis, presenting with Cdiff infection and initially on Vancomycin 125 orally QID but increased to 500 mg QID on 8/29. Despite supportive measures, she has continued to have persistent watery stools, some blood and mucus, and concern for exacerbation of underlying IBD remains. Plans for flex sig today with biopsies; she has been on Eliquis in setting of afib and will continue this.   Hypokalemia: in setting of GI losses due to persistent diarrhea. Continue to monitor. Currently 3.7 today.   Discussed risks and benefits of flex sig with patient. Tap water enemas  planned for this morning. She has been NPO since midnight except for meds. Clinically stable, with heart rate controlled in the 60s.   Plan: Flex sig with biopsies by Dr. Gala Romney today Tap water enemas this morning Remain NPO except meds Continue Eliquis Vancomycin currently 500 mg QID: further recommendations regarding dosing following endoscopic evaluation Continue Colazal   Annitta Needs, PhD, ANP-BC [facility]      LOS: 11 days    06/01/2019, 7:54 AM

## 2019-06-01 NOTE — Plan of Care (Signed)

## 2019-06-01 NOTE — Progress Notes (Signed)
Patient seen in the endoscopy unit.  No change.  Sigmoidoscopy this afternoon per plan.  The risks, benefits, limitations, alternatives and imponderables have been reviewed with the patient. Questions have been answered. All parties are agreeable.   Further recommendations to follow.

## 2019-06-01 NOTE — Care Management Important Message (Signed)
Important Message  Patient Details  Name: [name] MRN: [id number] Date of Birth: 1951-08-05   Medicare Important Message Given:  Yes(given to nurse to deliver to patient due to contact precautions)     Tommy Medal 06/01/2019, 1:42 PM

## 2019-06-01 NOTE — Op Note (Signed)
[facility] Patient Name: [name] Procedure Date: 06/01/2019 1:37 PM MRN: [id number] Date of Birth: 02/20/1951 Attending MD: Norvel Richards , MD CSN: [id number] Age: 68 Admit Type: Outpatient Procedure:                Flexible Sigmoidoscopy Indications:              Diarrhea Providers:                Norvel Richards, MD, Gerome Sam, RN,                            Randa Spike, Technician Referring MD:             [name], PA Medicines:                Midazolam 3 mg IV, Meperidine 25 mg IV Complications:            No immediate complications. Estimated Blood Loss:     Estimated blood loss was minimal. Procedure:                Pre-Anesthesia Assessment:                           - Prior to the procedure, a History and Physical                            was performed, and patient medications and                            allergies were reviewed. The patient's tolerance of                            previous anesthesia was also reviewed. The risks                            and benefits of the procedure and the sedation                            options and risks were discussed with the patient.                            All questions were answered, and informed consent                            was obtained. Prior Anticoagulants: The patient has                            taken no previous anticoagulant or antiplatelet                            agents. ASA Grade Assessment: II - A patient with                            mild systemic disease. After reviewing the risks  and benefits, the patient was deemed in                            satisfactory condition to undergo the procedure.                           After obtaining informed consent, the scope was                            passed under direct vision. The PCF-[id number]                            ([id number]) was introduced through the anus and   advanced to the the sigmoid colon. The flexible                            sigmoidoscopy was accomplished without difficulty.                            The patient tolerated the procedure well. The                            quality of the bowel preparation was adequate. Scope In: 2:22:46 PM Scope Out: 2:30:09 PM Total Procedure Duration: 0 hours 7 minutes 23 seconds  Findings:      Marked inflammatory changes of the rectum extending confluently to 25-30       cm. Entire loss of the normal vascular pattern. Diffusely granular,       friable mucosa with scattered erosions and frank ulcerations from the       rectum up into the sigmoid. No pseudomembranes seen. Was not able to       advance scope further due to upstream edema and compromise of the lumen       along with patient discomfort. Biopsies of the rectal and sigmoid mucosa       taken for histologic study. No retroflexion performed because of edema       and inflammation. Impression:               - Severe proctocolitis most consistent,                            endoscopically, with ulcerative colitis. I suspect                            an exacerbation of underlying inflammatory bowel                            disease with recent C. difficile infection.                           S/P post biopsy Moderate Sedation:      Moderate (conscious) sedation was personally administered by an       anesthesia professional. The following parameters were monitored: oxygen       saturation, heart rate, blood pressure, and response to care. Total       physician intraservice time was 8 minutes. Recommendation:  Continue present course of treatment for C.                            difficile. Stop Benadryl. Begin Entocort 9 mg                            daily. Add off label Questran 2 g daily - not to be                            taken within 2 hours of other medications.                            Follow-up on pathology. I have  reviewed my findings                            and recommendations with patient's husband, Marc Morgans                            [phone] Procedure Code(s):        --- Professional ---                           [phone], Sigmoidoscopy, flexible; diagnostic,                            including collection of specimen(s) by brushing or                            washing, when performed (separate procedure) Diagnosis Code(s):        --- Professional ---                           R19.7, Diarrhea, unspecified CPT copyright 2019 American Medical Association. All rights reserved. The codes documented in this report are preliminary and upon coder review may  be revised to meet current compliance requirements. [name]. Rourk, MD Norvel Richards, MD 06/01/2019 3:11:36 PM This report has been signed electronically. Number of Addenda: 0

## 2019-06-01 NOTE — Progress Notes (Signed)
Nutrition Follow-up  DOCUMENTATION CODES:   Obesity unspecified  INTERVENTION:  -Daily weights; current wt  > 7 days -Monitor for diet advancement s/p flex colonoscopy  NUTRITION DIAGNOSIS:   Inadequate oral intake related to diarrhea as evidenced by (full liquid diet and ongoing watery stools). ongoing  GOAL:   Patient will meet greater than or equal to 90% of their needs  Progressing; currently NPO for procedure  MONITOR:   Diet advancement, PO intake, Labs, Skin, Weight trends  REASON FOR ASSESSMENT:   LOS    ASSESSMENT:  RD working remotely.  Patient is a 68 yo female with persisting watery stools, + c.diff pancolitis. History of UC, COPD, GERD, Pancreatitis, anemia, DM-2, HOH.  Meal intake 8/30 - 9/01: 50-75% of FL/HH  Per MD, C. difficile pancolitis-symptoms persist she has been treated with Cipro and Flagyl which has been discontinued and now being treated with oral vancomycin   Patient with 4 watery stools overnight, noting blood with BMs consistently. Patient is feeling hungry but doesn't want to eat due to diarrhea -GI planning flex sig colonoscopy later today.  Suspect patient has experienced wt loss due to ongoing GI losses and full liquid diet orders for the greater part of admission. Last wt 90.7 kg taken on 8/21  Diet Order:   Diet Order            Diet NPO time specified  Diet effective now              EDUCATION NEEDS:   Not appropriate for education at this time  Skin:  Skin Assessment: Reviewed RN Assessment  Last BM:  8/31- pt inconteninet per nursing  Height:   Ht Readings from Last 1 Encounters:  05/20/19 5' 4"  (1.626 m)    Weight:   Wt Readings from Last 1 Encounters:  05/20/19 90.7 kg    Ideal Body Weight:  55 kg  BMI:  Body mass index is 34.33 kg/m.  Estimated Nutritional Needs:   Kcal:  8338-2505 (MSJ x 1.1-1.2 AF)  Protein:  83-99 gr (1.5-1.8 gr/kg/ibw)  Fluid:  1.6-1.7 liters daily - normal  needs   Lajuan Lines, RD, LDN  After Hours/Weekend Pager: [phone]

## 2019-06-02 LAB — CBC
HCT: 33.2 % — ABNORMAL LOW (ref 36.0–46.0)
Hemoglobin: 10.3 g/dL — ABNORMAL LOW (ref 12.0–15.0)
MCH: 30 pg (ref 26.0–34.0)
MCHC: 31 g/dL (ref 30.0–36.0)
MCV: 96.8 fL (ref 80.0–100.0)
Platelets: 295 10*3/uL (ref 150–400)
RBC: 3.43 MIL/uL — ABNORMAL LOW (ref 3.87–5.11)
RDW: 15.9 % — ABNORMAL HIGH (ref 11.5–15.5)
WBC: 8.6 10*3/uL (ref 4.0–10.5)
nRBC: 0 % (ref 0.0–0.2)

## 2019-06-02 LAB — BASIC METABOLIC PANEL
Anion gap: 5 (ref 5–15)
BUN: 5 mg/dL — ABNORMAL LOW (ref 8–23)
CO2: 26 mmol/L (ref 22–32)
Calcium: 7.8 mg/dL — ABNORMAL LOW (ref 8.9–10.3)
Chloride: 110 mmol/L (ref 98–111)
Creatinine, Ser: 0.86 mg/dL (ref 0.44–1.00)
GFR calc Af Amer: >60 mL/min (ref >60)
GFR calc non Af Amer: >60 mL/min (ref >60)
Glucose, Bld: 115 mg/dL — ABNORMAL HIGH (ref 70–99)
Potassium: 3.8 mmol/L (ref 3.5–5.1)
Sodium: 141 mmol/L (ref 135–145)

## 2019-06-02 LAB — MAGNESIUM: Magnesium: 1.8 mg/dL (ref 1.7–2.4)

## 2019-06-02 MED ORDER — VANCOMYCIN 50 MG/ML ORAL SOLUTION: 125.0000 mg | ORAL | Status: DC

## 2019-06-02 MED ORDER — VANCOMYCIN 50 MG/ML ORAL SOLUTION
125.0000 mg | Freq: Two times a day (BID) | ORAL | Status: AC
  Administered 2019-06-04 – 2019-06-11 (×14): 125 mg via ORAL
  Filled 2019-06-02 (×14): qty 2.5

## 2019-06-02 MED ORDER — VANCOMYCIN 50 MG/ML ORAL SOLUTION
125.0000 mg | Freq: Four times a day (QID) | ORAL | Status: AC
  Administered 2019-06-02 – 2019-06-04 (×8): 125 mg via ORAL
  Filled 2019-06-02 (×8): qty 2.5

## 2019-06-02 MED ORDER — FLUCONAZOLE 150 MG PO TABS
150.0000 mg | ORAL_TABLET | Freq: Every day | ORAL | Status: AC
  Administered 2019-06-02 – 2019-06-03 (×2): 150 mg via ORAL
  Filled 2019-06-02 (×2): qty 1

## 2019-06-02 MED ORDER — VANCOMYCIN 50 MG/ML ORAL SOLUTION
125.0000 mg | Freq: Every day | ORAL | Status: DC
  Administered 2019-06-12 – 2019-06-15 (×4): 125 mg via ORAL
  Filled 2019-06-02 (×6): qty 2.5

## 2019-06-02 NOTE — Plan of Care (Signed)
Continues with loose stools, just started Questran last pm.  Problem: Education: Goal: Knowledge of General Education information will improve Description: Including pain rating scale, medication(s)/side effects and non-pharmacologic comfort measures Outcome: Progressing   Problem: Health Behavior/Discharge Planning: Goal: Ability to manage health-related needs will improve Outcome: Progressing   Problem: Clinical Measurements: Goal: Ability to maintain clinical measurements within normal limits will improve Outcome: Progressing Goal: Will remain free from infection Outcome: Progressing Goal: Diagnostic test results will improve Outcome: Progressing Goal: Respiratory complications will improve Outcome: Progressing Goal: Cardiovascular complication will be avoided Outcome: Progressing   Problem: Activity: Goal: Risk for activity intolerance will decrease Outcome: Progressing   Problem: Nutrition: Goal: Adequate nutrition will be maintained Outcome: Progressing   Problem: Coping: Goal: Level of anxiety will decrease Outcome: Progressing   Problem: Elimination: Goal: Will not experience complications related to bowel motility Outcome: Progressing Goal: Will not experience complications related to urinary retention Outcome: Progressing   Problem: Pain Managment: Goal: General experience of comfort will improve Outcome: Progressing   Problem: Safety: Goal: Ability to remain free from injury will improve Outcome: Progressing   Problem: Skin Integrity: Goal: Risk for impaired skin integrity will decrease Outcome: Progressing

## 2019-06-02 NOTE — Progress Notes (Signed)
PROGRESS NOTE  Paula DEOLIVEIRA  NGE:[id number]  DOB: [birth year]  DOA: 05/20/2019 PCP: Jalene Mullet, PA-C   Brief Admission Hx: 68 year old female with recent diagnosis of ulcerative colitis presented with C. difficile positive diarrhea, abdominal pain and diffuse pancolitis, followed closely by GI service.  MDM/Assessment & Plan:   1. C diff pancolitis-symptoms persist she has been treated with Cipro and Flagyl which has been discontinued and now being treated with oral vancomycin per GI recommendations.  2. Ulcerative pancolitis -GI performed flex sig 9/2 with findings of severe pancolitis and biopsies are pending.  Started entocort 06/01/19. 3. Transient tachyarrhythmia- patient had some transient A. fib noted yesterday however has been back in sinus rhythm.  No recurrence noted.  Increased dose of metoprolol from 05/31/2019 is being tolerated 25 mg twice daily continue to follow. 4. Hypokalemia-secondary to ongoing GI losses-continue to replete and follow magnesium. 5. Chronic atrial fibrillation- metoprolol increased to 25 mg twice daily and seems controlled, apixaban for full anticoagulation. 6. COPD-continue bronchodilators seems stable at this time. 7. Anxiety/depression- resume home medications.  Stable.  DVT prophylaxis: Apixaban Code Status: Full Family Communication: Husband Disposition Plan: Continue inpatient treatments   Consultants:  GI  Procedures:  Flex sig 06/01/2019  Antimicrobials:  Oral vancomycin  Subjective: Diarrhea has not improved per patient.   Objective: Vitals:   06/02/19 0804 06/02/19 0935 06/02/19 1303 06/02/19 1400  BP:  115/73 121/72 (!) 158/90  Pulse:  72 74 75  Resp:  16 16 16   Temp:   98.4 F (36.9 C)   TempSrc:   Oral   SpO2: 98% 97% 98% 98%  Weight:      Height:        Intake/Output Summary (Last 24 hours) at 06/02/2019 1615 Last data filed at 06/02/2019 4010 Gross per 24 hour  Intake 2031.25 ml  Output -  Net 2031.25 ml    Filed Weights   05/20/19 1506 06/02/19 0523  Weight: 90.7 kg 97 kg     REVIEW OF SYSTEMS  As per history otherwise all reviewed and reported negative  Exam:  General exam: Awake, alert, no apparent distress, cooperative and pleasant. Respiratory system: Clear. No increased work of breathing. Cardiovascular system: S1 & S2 heard. No JVD, murmurs, gallops, clicks or pedal edema. Gastrointestinal system: Abdomen is nondistended, soft and diffuse nonspecific tenderness. Normal bowel sounds heard. Central nervous system: Alert and oriented. No focal neurological deficits. Extremities: no CCE.  Data Reviewed: Basic Metabolic Panel: Recent Labs  Lab 05/29/19 0609 05/30/19 0422 05/31/19 0445 06/01/19 0409 06/02/19 0446  NA 140 141 140 141 141  K 2.7* 4.9 3.9 3.7 3.8  CL 107 110 106 111 110  CO2 28 25 28 28 26   GLUCOSE 114* 103* 103* 97 115*  BUN <5* <5* <5* <5* 5*  CREATININE 0.81 0.73 0.74 0.78 0.86  CALCIUM 8.3* 8.1* 8.3* 7.9* 7.8*  MG 1.5* 2.1 1.9 2.0 1.8   Liver Function Tests: No results for input(s): AST, ALT, ALKPHOS, BILITOT, PROT, ALBUMIN in the last 168 hours. No results for input(s): LIPASE, AMYLASE in the last 168 hours. No results for input(s): AMMONIA in the last 168 hours. CBC: Recent Labs  Lab 05/27/19 0832 05/30/19 0422 06/01/19 0409 06/02/19 0446  WBC 8.9 9.9 8.1 8.6  HGB 11.7* 10.5* 10.8* 10.3*  HCT 36.9 33.8* 34.2* 33.2*  MCV 93.9 95.5 95.5 96.8  PLT 240 315 264 295   Cardiac Enzymes: No results for input(s): CKTOTAL, CKMB, CKMBINDEX, TROPONINI in the  last 168 hours. CBG (last 3)  No results for input(s): GLUCAP in the last 72 hours. No results found for this or any previous visit (from the past 240 hour(s)).   Studies: No results found.   Scheduled Meds: . apixaban  5 mg Oral BID  . balsalazide  2,250 mg Oral TID  . budesonide  9 mg Oral Daily  . buPROPion  150 mg Oral Daily  . busPIRone  7.5 mg Oral BID  . cholestyramine light  2 g  Oral Daily  . DULoxetine  120 mg Oral Daily  . famotidine  20 mg Oral BID AC  . fluconazole  150 mg Oral Daily  . ketotifen  1 drop Both Eyes Daily  . lidocaine  1 patch Transdermal Q24H  . loratadine  10 mg Oral Daily  . metoprolol tartrate  25 mg Oral BID  . mometasone-formoterol  2 puff Inhalation BID  . pravastatin  20 mg Oral Daily  . pregabalin  100 mg Oral BID  . traZODone  100 mg Oral QHS  . vancomycin  125 mg Oral QID   Followed by  . [START ON 06/04/2019] vancomycin  125 mg Oral BID   Followed by  . [START ON 06/12/2019] vancomycin  125 mg Oral Daily   Followed by  . [START ON 06/19/2019] vancomycin  125 mg Oral QODAY   Followed by  . [START ON 06/27/2019] vancomycin  125 mg Oral Q3 days   Continuous Infusions: . 0.9 % NaCl with KCl 40 mEq / L 50 mL/hr (06/02/19 1013)    Active Problems:   Colitis   Enteritis due to Clostridium difficile   Tachycardia  Time spent:   Irwin Brakeman, MD [facility] 06/02/2019, 4:15 PM    LOS: 12 days  How to contact the [facility] Attending or Consulting provider [place] or covering provider during after hours [place], for this patient?  1. Check the care team in [facility] and look for a) attending/consulting [facility] provider listed and b) the [facility] team listed 2. Log into [email or web address] and use [place]'s universal password to access. If you do not have the password, please contact the hospital operator. 3. Locate the [facility] provider you are looking for under [facility] and page to a number that you can be directly reached. 4. If you still have difficulty reaching the provider, please page the [facility] (Director on Call) for the Hospitalists listed on amion for assistance.

## 2019-06-02 NOTE — Progress Notes (Addendum)
Subjective:  Maybe some decreased stool frequency. Appetite fine. No n/v. No abd pain this morning.   Objective: Vital signs in last 24 hours: Temp:  [98.6 F (37 C)-98.8 F (37.1 C)] 98.6 F (37 C) (09/02 2045) Pulse Rate:  [59-75] 65 (09/03 0523) Resp:  [16-19] 18 (09/03 0523) BP: (115-137)/(68-105) 128/69 (09/03 0523) SpO2:  [91 %-99 %] 98 % (09/03 0804) Weight:  [97 kg] 97 kg (09/03 0523) Last BM Date: 06/01/19 General:   Alert,  Well-developed, well-nourished, pleasant and cooperative in NAD Head:  Normocephalic and atraumatic. Eyes:  Sclera clear, no icterus.  Abdomen:  Soft, nontender and nondistended.   Extremities:  Without clubbing, deformity or edema. Neurologic:  Alert and  oriented x4;  grossly normal neurologically. Psych:  Alert and cooperative. Normal mood and affect.  Intake/Output from previous day: 09/02 0701 - 09/03 0700 In: 1791.3 [P.O.:720; I.V.:1071.3] Out: -  Intake/Output this shift: No intake/output data recorded.  Lab Results: CBC Recent Labs    06/01/19 0409 06/02/19 0446  WBC 8.1 8.6  HGB 10.8* 10.3*  HCT 34.2* 33.2*  MCV 95.5 96.8  PLT 264 295   BMET Recent Labs    05/31/19 0445 06/01/19 0409 06/02/19 0446  NA 140 141 141  K 3.9 3.7 3.8  CL 106 111 110  CO2 28 28 26   GLUCOSE 103* 97 115*  BUN <5* <5* 5*  CREATININE 0.74 0.78 0.86  CALCIUM 8.3* 7.9* 7.8*   LFTs No results for input(s): BILITOT, BILIDIR, IBILI, ALKPHOS, AST, ALT, PROT, ALBUMIN in the last 72 hours. No results for input(s): LIPASE in the last 72 hours. PT/INR No results for input(s): LABPROT, INR in the last 72 hours.    Imaging Studies: Dg Chest 2 View  Result Date: 05/20/2019 CLINICAL DATA:  Abdominal cramping with nausea fever. EXAM: CHEST - 2 VIEW COMPARISON:  02/26/2018 FINDINGS: Cardiopericardial silhouette is at upper limits of normal for size. Hiatal hernia noted. The lungs are clear without focal pneumonia, edema, pneumothorax or pleural effusion.  Interstitial markings are diffusely coarsened with chronic features. The visualized bony structures of the thorax are intact. IMPRESSION: 1. No acute cardiopulmonary findings 2. Hiatal hernia. Electronically Signed   By: Misty Stanley M.D.   On: 05/20/2019 19:10   Ct Abdomen Pelvis W Contrast  Result Date: 05/20/2019 CLINICAL DATA:  Abdominal pain with diarrhea. EXAM: CT ABDOMEN AND PELVIS WITH CONTRAST TECHNIQUE: Multidetector CT imaging of the abdomen and pelvis was performed using the standard protocol following bolus administration of intravenous contrast. CONTRAST:  142m OMNIPAQUE IOHEXOL 300 MG/ML  SOLN COMPARISON:  05/01/2016 FINDINGS: Lower chest:  Large hiatal hernia. Hepatobiliary: No suspicious focal abnormality within the liver parenchyma. Gallbladder is surgically absent. No intrahepatic or extrahepatic biliary dilation. Pancreas: No focal mass lesion. No dilatation of the main duct. No intraparenchymal cyst. No peripancreatic edema. Spleen: No splenomegaly. No focal mass lesion. Adrenals/Urinary Tract: No adrenal nodule or mass. 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 Right kidney unremarkable. The urinary bladder appears normal for the degree of distention. Stomach/Bowel: Large hiatal hernia with approximately 75-80% of the stomach in the chest. Duodenum is normally positioned as is the ligament of Treitz. No small bowel wall thickening. No small bowel dilatation. Cecum is positioned in the anterior midline with unremarkable terminal ileum The appendix is not visualized, but there is no edema or inflammation in the region of the cecum. There is diffuse mild colonic wall thickening, most prominent in  the sigmoid segment but extending from the ascending colon to the level of the rectum. This is associated with pericolonic edema/inflammation. Diverticular changes are noted in the left colon. Vascular/Lymphatic: There is abdominal aortic  atherosclerosis without aneurysm. Celiac axis, SMA, and IMA are opacified. Portal vein and superior mesenteric vein are patent. There is no gastrohepatic or hepatoduodenal ligament lymphadenopathy. No intraperitoneal or retroperitoneal lymphadenopathy. No pelvic sidewall lymphadenopathy. Reproductive: The uterus is unremarkable.  There is no adnexal mass. Other: No intraperitoneal free fluid. Musculoskeletal: No worrisome lytic or sclerotic osseous abnormality. IMPRESSION: 1. Diffuse mild colonic wall thickening with pericolonic edema/inflammation. Imaging features are compatible with an infectious/inflammatory colitis. 2. Large hiatal hernia. 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 4. Abdominal aortic atherosclerosis. Aortic Atherosclerosis (ICD10-I70.0). Electronically Signed   By: Misty Stanley M.D.   On: 05/20/2019 19:06  [2 weeks]   Assessment: 68 year old female with history of pan ulcerative colitis, presenting with C. difficile infection, initially on vancomycin 125 mg orally 4 times daily but increased to 500 mg orally 4 times daily on August 29.  Colazal was increased to maximum dose on August 27 for concerns for exacerbation of underlying UC.  Patient continued to have persistent watery stools, some bloody mucus, therefore flexible sigmoidoscopy was performed.  Flexible sigmoidoscopy 06/01/2019 with severe proctocolitis most consistent endoscopically with UC.  Suspected exacerbation of underlying IBD with recent C. difficile infection.  Entocort 9 mg daily started yesterday.  Received first dose of Questran this morning, off label use. Hopefully will not improvement in symptoms in next 24-48 hours.   Plan: 1. Follow-up pathology 2. Continue Entocort 9 mg daily 3. Continue off label Questran 2 g daily not to be taken within 2 hours of other medications 4. Will start vancomycin after 14th day of vanc (06/04/19). Orders placed.     Laureen Ochs. Bernarda Caffey [facility] [phone] 9/3/202011:03 AM     LOS: 12 days

## 2019-06-03 DIAGNOSIS — K529 Noninfective gastroenteritis and colitis, unspecified: Secondary | ICD-10-CM

## 2019-06-03 MED ORDER — HYDROCORTISONE 100 MG/60ML RE ENEM
100.0000 mg | ENEMA | Freq: Every day | RECTAL | Status: DC
  Administered 2019-06-03 – 2019-06-09 (×6): 100 mg via RECTAL
  Filled 2019-06-03 (×10): qty 1

## 2019-06-03 NOTE — Progress Notes (Signed)
PROGRESS NOTE  [name]  XNA:[id number]  DOB: [birth year]  DOA: 05/20/2019 PCP: Jalene Mullet, PA-C   Brief Admission Hx: 68 year old female with recent diagnosis of ulcerative colitis presented with C. difficile positive diarrhea, abdominal pain and diffuse pancolitis, followed closely by GI service.  MDM/Assessment & Plan:   1. C diff pancolitis-symptoms persist she has been treated with Cipro and Flagyl which has been discontinued and now being treated with oral vancomycin per GI recommendations.  2. Ulcerative pancolitis -GI performed flex sig 9/2 with findings of severe pancolitis and biopsies are pending.  Started entocort 06/01/19. 3. Transient tachyarrhythmia- patient had some transient A. fib noted yesterday however has been back in sinus rhythm.  No recurrence noted.  Increased dose of metoprolol from 05/31/2019 is being tolerated 25 mg twice daily continue to follow. 4. Hypokalemia-secondary to ongoing GI losses-continue to replete and follow magnesium. 5. Chronic atrial fibrillation- metoprolol increased to 25 mg twice daily and seems controlled, apixaban for full anticoagulation. 6. COPD-continue bronchodilators seems stable at this time. 7. Anxiety/depression- resume home medications.  Stable.  DVT prophylaxis: Apixaban Code Status: Full Family Communication: Husband Disposition Plan: Continue inpatient treatments   Consultants:  GI  Procedures:  Flex sig 06/01/2019  Antimicrobials:  Oral vancomycin  Subjective: Diarrhea initially improved but has rebounded back this morning per patient.   Objective: Vitals:   06/02/19 2126 06/03/19 0455 06/03/19 0828 06/03/19 1418  BP: 134/75 (!) 148/71  (!) 141/99  Pulse: 69 65  72  Resp: 18 18  18   Temp: 98.6 F (37 C) 97.8 F (36.6 C)  98.6 F (37 C)  TempSrc: Oral Oral  Oral  SpO2: 94% 93% 93% 97%  Weight:  96.5 kg    Height:        Intake/Output Summary (Last 24 hours) at 06/03/2019 1431 Last data filed at  06/03/2019 1014 Gross per 24 hour  Intake 600 ml  Output -  Net 600 ml   Filed Weights   05/20/19 1506 06/02/19 0523 06/03/19 0455  Weight: 90.7 kg 97 kg 96.5 kg     REVIEW OF SYSTEMS  As per history otherwise all reviewed and reported negative  Exam:  General exam: Awake, alert, no apparent distress, cooperative and pleasant. Respiratory system: Clear. No increased work of breathing. Cardiovascular system: S1 & S2 heard. No JVD, murmurs, gallops, clicks or pedal edema. Gastrointestinal system: Abdomen is nondistended, soft and diffuse nonspecific tenderness. Normal bowel sounds heard. Central nervous system: Alert and oriented. No focal neurological deficits. Extremities: no CCE.  Data Reviewed: Basic Metabolic Panel: Recent Labs  Lab 05/29/19 0609 05/30/19 0422 05/31/19 0445 06/01/19 0409 06/02/19 0446  NA 140 141 140 141 141  K 2.7* 4.9 3.9 3.7 3.8  CL 107 110 106 111 110  CO2 28 25 28 28 26   GLUCOSE 114* 103* 103* 97 115*  BUN <5* <5* <5* <5* 5*  CREATININE 0.81 0.73 0.74 0.78 0.86  CALCIUM 8.3* 8.1* 8.3* 7.9* 7.8*  MG 1.5* 2.1 1.9 2.0 1.8   Liver Function Tests: No results for input(s): AST, ALT, ALKPHOS, BILITOT, PROT, ALBUMIN in the last 168 hours. No results for input(s): LIPASE, AMYLASE in the last 168 hours. No results for input(s): AMMONIA in the last 168 hours. CBC: Recent Labs  Lab 05/30/19 0422 06/01/19 0409 06/02/19 0446  WBC 9.9 8.1 8.6  HGB 10.5* 10.8* 10.3*  HCT 33.8* 34.2* 33.2*  MCV 95.5 95.5 96.8  PLT 315 264 295   Cardiac Enzymes:  No results for input(s): CKTOTAL, CKMB, CKMBINDEX, TROPONINI in the last 168 hours. CBG (last 3)  No results for input(s): GLUCAP in the last 72 hours. No results found for this or any previous visit (from the past 240 hour(s)).   Studies: No results found.   Scheduled Meds: . apixaban  5 mg Oral BID  . balsalazide  2,250 mg Oral TID  . budesonide  9 mg Oral Daily  . buPROPion  150 mg Oral Daily   . busPIRone  7.5 mg Oral BID  . cholestyramine light  2 g Oral Daily  . DULoxetine  120 mg Oral Daily  . famotidine  20 mg Oral BID AC  . ketotifen  1 drop Both Eyes Daily  . lidocaine  1 patch Transdermal Q24H  . loratadine  10 mg Oral Daily  . metoprolol tartrate  25 mg Oral BID  . mometasone-formoterol  2 puff Inhalation BID  . pravastatin  20 mg Oral Daily  . pregabalin  100 mg Oral BID  . traZODone  100 mg Oral QHS  . vancomycin  125 mg Oral QID   Followed by  . [START ON 06/04/2019] vancomycin  125 mg Oral BID   Followed by  . [START ON 06/12/2019] vancomycin  125 mg Oral Daily   Followed by  . [START ON 06/19/2019] vancomycin  125 mg Oral QODAY   Followed by  . [START ON 06/27/2019] vancomycin  125 mg Oral Q3 days   Continuous Infusions: . 0.9 % NaCl with KCl 40 mEq / L 50 mL/hr (06/03/19 0522)    Active Problems:   Colitis   Enteritis due to Clostridium difficile   Tachycardia   IBD (inflammatory bowel disease)  Time spent:   Irwin Brakeman, MD [facility] 06/03/2019, 2:31 PM    LOS: 13 days  How to contact the [facility] Attending or Consulting provider [place] or covering provider during after hours [place], for this patient?  1. Check the care team in [facility] and look for a) attending/consulting TRH provider listed and b) the [facility] team listed 2. Log into [email or web address] and use [place]'s universal password to access. If you do not have the password, please contact the hospital operator. 3. Locate the [facility] provider you are looking for under [facility] and page to a number that you can be directly reached. 4. If you still have difficulty reaching the provider, please page the [facility] (Director on Call) for the Hospitalists listed on amion for assistance.

## 2019-06-03 NOTE — Progress Notes (Signed)
States has had at least 12-14 loose stools today, though small.  Last one viewed was mucus consistency with some blood smear.  C/O nausea around supper time but was continuing to eat and said that it happens from time to time.  Husband left but was at bedside most of day

## 2019-06-03 NOTE — Care Management Important Message (Signed)
Important Message  Patient Details  Name: [name] MRN: [id number] Date of Birth: 11-02-1950   Medicare Important Message Given:  Yes     Tommy Medal 06/03/2019, 12:20 PM

## 2019-06-03 NOTE — Progress Notes (Signed)
Subjective: Patient states she is doing about the same.  She is eating and tolerating a diet.  Some mild nausea but no vomiting.  She does have abdominal pain postprandial.  She had a bowel movement last night and then not again to this morning, but this morning she had about a total of 4-5 loose stools.  No other GI complaints.  Objective: Vital signs in last 24 hours: Temp:  [97.8 F (36.6 C)-98.6 F (37 C)] 97.8 F (36.6 C) (09/04 0455) Pulse Rate:  [65-75] 65 (09/04 0455) Resp:  [16-18] 18 (09/04 0455) BP: (115-158)/(71-97) 148/71 (09/04 0455) SpO2:  [93 %-100 %] 93 % (09/04 0455) Weight:  [96.5 kg] 96.5 kg (09/04 0455) Last BM Date: 06/03/19 General:   Alert and oriented, pleasant Head:  Normocephalic and atraumatic. Eyes:  No icterus, sclera clear. Conjuctiva pink.  Heart:  S1, S2 present, no murmurs noted.  Lungs: Clear to auscultation bilaterally, without wheezing, rales, or rhonchi.  Abdomen:  Bowel sounds present, soft, non-tender, non-distended. No HSM or hernias noted. No rebound or guarding. No masses appreciated  Msk:  Symmetrical without gross deformities. Pulses:  Normal bilateral DP pulses noted. Extremities:  Without clubbing or edema. Neurologic:  Alert and  oriented x4;  grossly normal neurologically. Psych:  Alert and cooperative. Normal mood and affect.  Intake/Output from previous day: 09/03 0701 - 09/04 0700 In: 720 [P.O.:720] Out: -  Intake/Output this shift: No intake/output data recorded.  Lab Results: Recent Labs    06/01/19 0409 06/02/19 0446  WBC 8.1 8.6  HGB 10.8* 10.3*  HCT 34.2* 33.2*  PLT 264 295   BMET Recent Labs    06/01/19 0409 06/02/19 0446  NA 141 141  K 3.7 3.8  CL 111 110  CO2 28 26  GLUCOSE 97 115*  BUN <5* 5*  CREATININE 0.78 0.86  CALCIUM 7.9* 7.8*   LFT No results for input(s): PROT, ALBUMIN, AST, ALT, ALKPHOS, BILITOT, BILIDIR, IBILI in the last 72 hours. PT/INR No results for input(s): LABPROT, INR in  the last 72 hours. Hepatitis Panel No results for input(s): HEPBSAG, HCVAB, HEPAIGM, HEPBIGM in the last 72 hours.   Studies/Results: No results found.  Assessment: 68 year old female with history of pan ulcerative colitis, presenting with C. difficile infection, initially on vancomycin 125 mg orally 4 times daily but increased to 500 mg orally 4 times daily on August 29.  Colazal was increased to maximum dose on August 27 for concerns for exacerbation of underlying UC.  Patient continued to have persistent watery stools, some bloody mucus, therefore flexible sigmoidoscopy was performed.  Flexible sigmoidoscopy 06/01/2019 with severe proctocolitis most consistent endoscopically with UC.  Suspected exacerbation of underlying IBD with recent C. difficile infection.  Entocort 9 mg daily started 06/01/19.  Received first dose of Questran this yesterday morning, off label use. Diarrhea about the same yesterday but had only received <24 hours of medication adjustments/initiation. Scheduled to begin Vancomycin 3-week taper today. Per flowsheets had liquid stool yesterday morning then 0300 this morning.  Today she feels she is doing about the same.  She had a bowel movement last night and then none again till this morning but then she states she had 4-5 bowel movements. No hematochezia. Postprandial abdominal discomfort. Mild nausea, no vomiting. When I entered the room she was sitting edge of bed eating. She is non-toxic appearing. Keeping food down well. Non-toxic appearing. Overall looks well.  Plan: 1. Continue to follow for pathology results 2. Vancomycin taper  as already ordered 3. Continued off-label Questran 4. Continued Entocort 9 mg daily 5. Document stools (number and characteristics) 6. Would anticipate d/c in the near future if she continues to improve   Thank you for allowing [place] to participate in the care of [place], DNP, AGNP-C Adult & Gerontological Nurse  Practitioner [facility]    LOS: 13 days    06/03/2019, 7:47 AM

## 2019-06-04 NOTE — Progress Notes (Signed)
PROGRESS NOTE  [name]  KDT:[id number]  DOB: Jan 31, 1951  DOA: 05/20/2019 PCP: Jalene Mullet, PA-C  Brief Admission Hx: 68 year old female with recent diagnosis of ulcerative colitis presented with C. difficile positive diarrhea, abdominal pain and diffuse pancolitis, followed closely by GI service.  MDM/Assessment & Plan:   1. C diff pancolitis-symptoms persist she has been treated with Cipro and Flagyl which has been discontinued and now being treated with oral vancomycin per GI recommendations.  She is now on a 3 week taper of oral vancomycin per GI service.  2. Ulcerative pancolitis -GI performed flex sig 9/2 with findings of severe pancolitis and biopsies are pending.  Started entocort 06/01/19 and now receiving hydrocortisone enema.  Pt reports less diarrhea after receiving hydrocortisone enema. 3. Transient tachyarrhythmia- patient had some transient A. fib noted yesterday however has been back in sinus rhythm.  No recurrence noted.  Increased dose of metoprolol from 05/31/2019 is being tolerated 25 mg twice daily continue to follow. 4. Hypokalemia-repleted.  Secondary to ongoing GI losses-continue to replete as needed and follow magnesium. 5. Chronic atrial fibrillation- metoprolol increased to 25 mg twice daily and seems controlled, apixaban for full anticoagulation. 6. COPD-continue bronchodilators seems stable at this time. 7. Anxiety/depression- resume home medications.  Stable.  DVT prophylaxis: Apixaban Code Status: Full Family Communication: Husband Disposition Plan: Continue inpatient treatments   Consultants:  GI  Procedures:  Flex sig 06/01/2019  Antimicrobials:  Oral vancomycin  Subjective: Pt reports less diarrhea after receiving hydrocortisone enema last night.    Objective: Vitals:   06/03/19 2115 06/04/19 0431 06/04/19 0514 06/04/19 0515  BP: 137/76  (!) 132/114 121/77  Pulse: 80  (!) 59 (!) 56  Resp: 17  17   Temp: 98.9 F (37.2 C)  98.4 F  (36.9 C)   TempSrc: Oral  Oral   SpO2: 90%  98% 99%  Weight:  93.9 kg    Height:        Intake/Output Summary (Last 24 hours) at 06/04/2019 1129 Last data filed at 06/04/2019 9983 Gross per 24 hour  Intake 4047.9 ml  Output -  Net 4047.9 ml   Filed Weights   06/02/19 0523 06/03/19 0455 06/04/19 0431  Weight: 97 kg 96.5 kg 93.9 kg   REVIEW OF SYSTEMS  As per history otherwise all reviewed and reported negative  Exam:  General exam: Awake, alert, no apparent distress, cooperative and pleasant. Respiratory system: Clear. No increased work of breathing. Cardiovascular system: S1 & S2 heard. No JVD, murmurs, gallops, clicks or pedal edema. Gastrointestinal system: Abdomen is nondistended, soft and diffuse nonspecific tenderness. Normal bowel sounds heard. Central nervous system: Alert and oriented. No focal neurological deficits. Extremities: no CCE.  Data Reviewed: Basic Metabolic Panel: Recent Labs  Lab 05/29/19 0609 05/30/19 0422 05/31/19 0445 06/01/19 0409 06/02/19 0446  NA 140 141 140 141 141  K 2.7* 4.9 3.9 3.7 3.8  CL 107 110 106 111 110  CO2 28 25 28 28 26   GLUCOSE 114* 103* 103* 97 115*  BUN <5* <5* <5* <5* 5*  CREATININE 0.81 0.73 0.74 0.78 0.86  CALCIUM 8.3* 8.1* 8.3* 7.9* 7.8*  MG 1.5* 2.1 1.9 2.0 1.8   Liver Function Tests: No results for input(s): AST, ALT, ALKPHOS, BILITOT, PROT, ALBUMIN in the last 168 hours. No results for input(s): LIPASE, AMYLASE in the last 168 hours. No results for input(s): AMMONIA in the last 168 hours. CBC: Recent Labs  Lab 05/30/19 0422 06/01/19 0409 06/02/19 3825  WBC 9.9 8.1 8.6  HGB 10.5* 10.8* 10.3*  HCT 33.8* 34.2* 33.2*  MCV 95.5 95.5 96.8  PLT 315 264 295   Cardiac Enzymes: No results for input(s): CKTOTAL, CKMB, CKMBINDEX, TROPONINI in the last 168 hours. CBG (last 3)  No results for input(s): GLUCAP in the last 72 hours. No results found for this or any previous visit (from the past 240 hour(s)).    Studies: No results found.   Scheduled Meds: . apixaban  5 mg Oral BID  . balsalazide  2,250 mg Oral TID  . budesonide  9 mg Oral Daily  . buPROPion  150 mg Oral Daily  . busPIRone  7.5 mg Oral BID  . DULoxetine  120 mg Oral Daily  . famotidine  20 mg Oral BID AC  . hydrocortisone  100 mg Rectal QHS  . ketotifen  1 drop Both Eyes Daily  . lidocaine  1 patch Transdermal Q24H  . loratadine  10 mg Oral Daily  . metoprolol tartrate  25 mg Oral BID  . mometasone-formoterol  2 puff Inhalation BID  . pravastatin  20 mg Oral Daily  . pregabalin  100 mg Oral BID  . traZODone  100 mg Oral QHS  . vancomycin  125 mg Oral BID   Followed by  . [START ON 06/12/2019] vancomycin  125 mg Oral Daily   Followed by  . [START ON 06/19/2019] vancomycin  125 mg Oral QODAY   Followed by  . [START ON 06/27/2019] vancomycin  125 mg Oral Q3 days   Continuous Infusions: . 0.9 % NaCl with KCl 40 mEq / L 50 mL/hr at 06/04/19 0300    Active Problems:   Colitis   Enteritis due to Clostridium difficile   Tachycardia   IBD (inflammatory bowel disease)  Time spent:   Irwin Brakeman, MD [facility] 06/04/2019, 11:29 AM    LOS: 14 days  How to contact the [facility] Attending or Consulting provider [place] or covering provider during after hours [place], for this patient?  1. Check the care team in [facility] and look for a) attending/consulting TRH provider listed and b) the [facility] team listed 2. Log into [email or web address] and use [place]'s universal password to access. If you do not have the password, please contact the hospital operator. 3. Locate the [facility] provider you are looking for under [facility] and page to a number that you can be directly reached. 4. If you still have difficulty reaching the provider, please page the [facility] (Director on Call) for the Hospitalists listed on amion for assistance.

## 2019-06-04 NOTE — Progress Notes (Signed)
  Assessment/Plan: Admitted with C DIFF COLITIS/UC FLARE. HOSPITAL STAY PROLONGED BY TNTC DIARRHEA. FLEX SIG SHOWED INFLAMED RECTUM/OLON. INCREASED # STOOL WITH ADDITION OF QUESTRAN. HCT ENEMA ADDED SEP 4. LAST DOES QUESTRAN SEP 4. CLINICALLY IMPROVED.  PLAN: 1. CONTINUE ENTOCORT/HCT ENEMA QHS. 2. CONTINUE [place]. 3. CONTINUE VANC. 4. LACTOSE FREE DIET.   Subjective: Since I last evaluated the patient SHE HAS HAD LESS BMs TODAY. HAD MODERATE NAUSEA AND VOMITING x1 (NO BLOOD). LOWER ABDOMINAL PAIN IS MILD.  Objective: Vital signs in last 24 hours: Vitals:   06/04/19 0515 06/04/19 1005  BP: 121/77   Pulse: (!) 56   Resp:    Temp:    SpO2: 99% 97%   General appearance: alert, cooperative and no distress Resp: clear to auscultation bilaterally Cardio: regular rate and rhythm GI: soft, MILDLY tender IN BLQs; bowel sounds normal;   Lab Results:  NONE   Studies/Results: No results found.  Medications: I have reviewed the patient's current medications.

## 2019-06-05 ENCOUNTER — Inpatient Hospital Stay: Payer: Self-pay

## 2019-06-05 DIAGNOSIS — R300 Dysuria: Secondary | ICD-10-CM

## 2019-06-05 LAB — URINALYSIS, ROUTINE W REFLEX MICROSCOPIC
Bilirubin Urine: NEGATIVE
Glucose, UA: NEGATIVE mg/dL
Ketones, ur: NEGATIVE mg/dL
Nitrite: NEGATIVE
Protein, ur: NEGATIVE mg/dL
Specific Gravity, Urine: 1.004 — ABNORMAL LOW (ref 1.005–1.030)
pH: 7 (ref 5.0–8.0)

## 2019-06-05 LAB — BASIC METABOLIC PANEL
Anion gap: 8 (ref 5–15)
BUN: 5 mg/dL — ABNORMAL LOW (ref 8–23)
CO2: 27 mmol/L (ref 22–32)
Calcium: 8.6 mg/dL — ABNORMAL LOW (ref 8.9–10.3)
Chloride: 103 mmol/L (ref 98–111)
Creatinine, Ser: 0.81 mg/dL (ref 0.44–1.00)
GFR calc Af Amer: >60 mL/min (ref >60)
GFR calc non Af Amer: >60 mL/min (ref >60)
Glucose, Bld: 92 mg/dL (ref 70–99)
Potassium: 3.5 mmol/L (ref 3.5–5.1)
Sodium: 138 mmol/L (ref 135–145)

## 2019-06-05 MED ORDER — MAGNESIUM SULFATE 2 GM/50ML IV SOLN
2.0000 g | Freq: Once | INTRAVENOUS | Status: AC
  Administered 2019-06-05: 2 g via INTRAVENOUS
  Filled 2019-06-05: qty 50

## 2019-06-05 NOTE — Progress Notes (Signed)
Midline found to have been redressed, and pulled back 1cm from initial measurement prior to arrival of this rn. Location of hub due to dressing change rubbing antecubital area of R arm. Patient c/o pain at antecubital area. Denies pain when flushing midline. Line assessed for patency and found to flush appropriately. Does not withdraw. I explained to primary RN that it is not uncommon for a midline to not withdraw, and this is WNL. Dressing changed and hub angled away from [facility] area. New securement device, new biopatch, new tegaderm, new cap. Patient denies pain at this time. Patient instructed to notify staff if pain occurs. Primary RN notified to continue to monitor.

## 2019-06-05 NOTE — Progress Notes (Signed)
   Assessment/Plan: Admitted with C DIFF COLITIS/UC FLARE. CLINICALLY IMPROVED. LESS BM TODAY BUT STILL > 10. NO VOMITING.  PLAN: 1. CONTINUE HCT/COLAZAL/ENTOCORT/VANCOMYCIN. 2. LACTOSE FREE DIET 3. DISCUSSED WITH PT IF WE CAN GET BMs< 5/DAY WE CAN DISCHARGE HER TO HOME.   Subjective: Since I last evaluated the patient SHE HAD > 10 BMs IN 24 HRS BUT LESS THAN YESTERDAY. MILD NAUSEA TODAY. NO VOMITING. PAIN MILD IN LOWER ABDOMEN.  Objective: Vital signs in last 24 hours: Vitals:   06/05/19 0518 06/05/19 0750  BP: (!) 121/59   Pulse: 66   Resp: 17   Temp: 98.3 F (36.8 C)   SpO2: 94% 93%   General appearance: alert, cooperative and no distress Resp: clear to auscultation bilaterally Cardio: regular rate and rhythm GI: soft, bowel sounds normal;   Lab Results:   K 3.5 Cr 0.81    Studies/Results: No results found.  Medications: I have reviewed the patient's current medications.

## 2019-06-05 NOTE — Progress Notes (Signed)
Fluids restarted in her midline, patient immediately started complaining of stinging.  Fluids stopped and will notify MD.  Order for new midline placed per Murvin Natal MD.  Will continue to monitor.

## 2019-06-05 NOTE — Progress Notes (Signed)
Measuring hat placed in [facility] and educated patient to have a clean catch urine sample for her urinalysis. Will continue to monitor and collect when sample is obtained.

## 2019-06-05 NOTE — Progress Notes (Signed)
Pt c/o pain in and around the site of her midline. States it has been there for approx. 48 hours. Line flushes fine but she c/o pain. The line will not draw back any blood. MD made aware and stated to contact vascular wellness team for maintenance of this line or to place another whichever they feel is necessary. Vascular wellness contacted, will give attending RN a call back with ETA. In the meantime patient's fluids stopped and line is clamped. Will continue to monitor.

## 2019-06-05 NOTE — Progress Notes (Signed)
PROGRESS NOTE  [name]  JJK:[id number]  DOB: [birth year]  DOA: 05/20/2019 PCP: Jalene Mullet, PA-C  Brief Admission Hx: 68 year old female with recent diagnosis of ulcerative colitis presented with C. difficile positive diarrhea, abdominal pain and diffuse pancolitis, followed closely by GI service.  MDM/Assessment & Plan:   1. C diff pancolitis-symptoms persist she has been treated with Cipro and Flagyl which has been discontinued and now being treated with oral vancomycin per GI recommendations.  She is now on a 3 week taper of oral vancomycin per GI service.  2. Dysuria - check urinalysis as she may have a UTI with frequent diarrhea.   3. Ulcerative pancolitis -GI performed flex sig 9/2 with findings of severe pancolitis and biopsies are pending.  Started entocort 06/01/19 and now receiving hydrocortisone enema.  Pt reports less diarrhea after receiving hydrocortisone enema. 4. Transient tachyarrhythmia- patient had some transient A. fib noted yesterday however has been back in sinus rhythm.  No recurrence noted.  Increased dose of metoprolol from 05/31/2019 is being tolerated 25 mg twice daily continue to follow. 5. Hypokalemia-repleted.  Secondary to ongoing GI losses-continue to replete as needed and follow magnesium. 6. Chronic atrial fibrillation- metoprolol increased to 25 mg twice daily and seems controlled, apixaban for full anticoagulation. 7. COPD-continue bronchodilators seems stable at this time. 8. Anxiety/depression- resume home medications.  Stable.  DVT prophylaxis: Apixaban Code Status: Full Family Communication: Husband Disposition Plan: Continue inpatient treatments  Consultants:  GI  Procedures:  Flex sig 06/01/2019  Antimicrobials:  Oral vancomycin  Subjective: Pt reports less diarrhea but reports burning with urination.    Objective: Vitals:   06/04/19 1950 06/04/19 2143 06/05/19 0518 06/05/19 0750  BP:  119/64 (!) 121/59   Pulse:  65 66   Resp:   17 17   Temp:  98.6 F (37 C) 98.3 F (36.8 C)   TempSrc:  Oral    SpO2: 96% 96% 94% 93%  Weight:   93.4 kg   Height:        Intake/Output Summary (Last 24 hours) at 06/05/2019 1029 Last data filed at 06/04/2019 1759 Gross per 24 hour  Intake 480 ml  Output -  Net 480 ml   Filed Weights   06/03/19 0455 06/04/19 0431 06/05/19 0518  Weight: 96.5 kg 93.9 kg 93.4 kg   REVIEW OF SYSTEMS  As per history otherwise all reviewed and reported negative  Exam:  General exam: Awake, alert, no apparent distress, cooperative and pleasant. Respiratory system: Clear. No increased work of breathing. Cardiovascular system: S1 & S2 heard. No JVD, murmurs, gallops, clicks or pedal edema. Gastrointestinal system: Abdomen is nondistended, soft and diffuse nonspecific tenderness. Normal bowel sounds heard. Central nervous system: Alert and oriented. No focal neurological deficits. Extremities: no CCE.  Data Reviewed: Basic Metabolic Panel: Recent Labs  Lab 05/30/19 0422 05/31/19 0445 06/01/19 0409 06/02/19 0446 06/05/19 0609  NA 141 140 141 141 138  K 4.9 3.9 3.7 3.8 3.5  CL 110 106 111 110 103  CO2 25 28 28 26 27   GLUCOSE 103* 103* 97 115* 92  BUN <5* <5* <5* 5* 5*  CREATININE 0.73 0.74 0.78 0.86 0.81  CALCIUM 8.1* 8.3* 7.9* 7.8* 8.6*  MG 2.1 1.9 2.0 1.8  --    Liver Function Tests: No results for input(s): AST, ALT, ALKPHOS, BILITOT, PROT, ALBUMIN in the last 168 hours. No results for input(s): LIPASE, AMYLASE in the last 168 hours. No results for input(s): AMMONIA in the last  168 hours. CBC: Recent Labs  Lab 05/30/19 0422 06/01/19 0409 06/02/19 0446  WBC 9.9 8.1 8.6  HGB 10.5* 10.8* 10.3*  HCT 33.8* 34.2* 33.2*  MCV 95.5 95.5 96.8  PLT 315 264 295   Cardiac Enzymes: No results for input(s): CKTOTAL, CKMB, CKMBINDEX, TROPONINI in the last 168 hours. CBG (last 3)  No results for input(s): GLUCAP in the last 72 hours. No results found for this or any previous visit (from  the past 240 hour(s)).   Studies: No results found.   Scheduled Meds: . apixaban  5 mg Oral BID  . balsalazide  2,250 mg Oral TID  . budesonide  9 mg Oral Daily  . buPROPion  150 mg Oral Daily  . busPIRone  7.5 mg Oral BID  . DULoxetine  120 mg Oral Daily  . famotidine  20 mg Oral BID AC  . hydrocortisone  100 mg Rectal QHS  . ketotifen  1 drop Both Eyes Daily  . lidocaine  1 patch Transdermal Q24H  . loratadine  10 mg Oral Daily  . metoprolol tartrate  25 mg Oral BID  . mometasone-formoterol  2 puff Inhalation BID  . pravastatin  20 mg Oral Daily  . pregabalin  100 mg Oral BID  . traZODone  100 mg Oral QHS  . vancomycin  125 mg Oral BID   Followed by  . [START ON 06/12/2019] vancomycin  125 mg Oral Daily   Followed by  . [START ON 06/19/2019] vancomycin  125 mg Oral QODAY   Followed by  . [START ON 06/27/2019] vancomycin  125 mg Oral Q3 days   Continuous Infusions: . 0.9 % NaCl with KCl 40 mEq / L 50 mL/hr (06/04/19 1833)    Active Problems:   Colitis   Enteritis due to Clostridium difficile   Tachycardia   IBD (inflammatory bowel disease)  Time spent:   Irwin Brakeman, MD [facility] 06/05/2019, 10:29 AM    LOS: 15 days  How to contact the [facility] Attending or Consulting provider [place] or covering provider during after hours [place], for this patient?  1. Check the care team in [facility] and look for a) attending/consulting TRH provider listed and b) the [facility] team listed 2. Log into [email or web address] and use [place]'s universal password to access. If you do not have the password, please contact the hospital operator. 3. Locate the [facility] provider you are looking for under [facility] and page to a number that you can be directly reached. 4. If you still have difficulty reaching the provider, please page the [facility] (Director on Call) for the Hospitalists listed on amion for assistance.

## 2019-06-06 NOTE — Progress Notes (Signed)
Patient has home CPAP unit at bedside. Patient states she is able to place self on and off when ready. RT informed patient to call if she has any trouble.

## 2019-06-06 NOTE — Progress Notes (Signed)
PROGRESS NOTE  [name]  OEU:[id number]  DOB: [birth year]  DOA: 05/20/2019 PCP: Jalene Mullet, PA-C  Brief Admission Hx: 68 year old female with recent diagnosis of ulcerative colitis presented with C. difficile positive diarrhea, abdominal pain and diffuse pancolitis, followed closely by GI service.  MDM/Assessment & Plan:   1. C diff pancolitis-symptoms persist she has been treated with Cipro and Flagyl which has been discontinued and now being treated with oral vancomycin per GI recommendations.  She is now on a 3 week taper of oral vancomycin per GI service.  2. Dysuria - urinalysis not indicative of infection, follow culture, follow symptoms.   3. Ulcerative pancolitis -GI performed flex sig 9/2 with findings of severe pancolitis and biopsies with severe active chronic colitis with erosions consistent with ulcerative colitis diagnosis.  Started entocort 06/01/19 and now receiving hydrocortisone enemas.  GI planning safe DC home when BMs <5 per day.  4. Transient tachyarrhythmia- patient had some transient A. fib noted has been back in sinus rhythm.  No recurrence noted.  Increased dose of metoprolol from 05/31/2019 is being tolerated 25 mg twice daily.  5. Hypotension - likely secondary to dehydration, increased IVFs and BP improved.  6. Hypokalemia-repleted.  Secondary to ongoing GI losses-continue to replete as needed and follow magnesium. 7. Chronic atrial fibrillation- metoprolol increased to 25 mg twice daily, continue apixaban for full anticoagulation. 8. COPD-continue bronchodilators seems stable at this time. 9. Anxiety/depression- resume home medications.  Stable.  DVT prophylaxis: Apixaban Code Status: Full Family Communication: Husband Disposition Plan: Continue inpatient treatments  Consultants:  GI  Procedures:  Flex sig 06/01/2019  Antimicrobials:  Oral vancomycin  Subjective: Pt continues to have frequent diarrhea.  Some minimal improvement.  Tolerating  diet.     Objective: Vitals:   06/05/19 1932 06/05/19 2216 06/06/19 0500 06/06/19 0635  BP:  (!) 104/53  130/71  Pulse:  76  69  Resp:  18  18  Temp:  98.5 F (36.9 C)  98 F (36.7 C)  TempSrc:  Oral  Oral  SpO2: 93% 91%  99%  Weight:   87.1 kg   Height:        Intake/Output Summary (Last 24 hours) at 06/06/2019 1240 Last data filed at 06/06/2019 0900 Gross per 24 hour  Intake 720 ml  Output -  Net 720 ml   Filed Weights   06/04/19 0431 06/05/19 0518 06/06/19 0500  Weight: 93.9 kg 93.4 kg 87.1 kg   REVIEW OF SYSTEMS  As per history otherwise all reviewed and reported negative  Exam:  General exam: Awake, alert, no apparent distress, cooperative and pleasant. Respiratory system: Clear. No increased work of breathing. Cardiovascular system: S1 & S2 heard. No JVD, murmurs, gallops, clicks or pedal edema. Gastrointestinal system: Abdomen is nondistended, soft and diffuse nonspecific tenderness. Normal bowel sounds heard. Central nervous system: Alert and oriented. No focal neurological deficits. Extremities: no CCE.  Data Reviewed: Basic Metabolic Panel: Recent Labs  Lab 05/31/19 0445 06/01/19 0409 06/02/19 0446 06/05/19 0609  NA 140 141 141 138  K 3.9 3.7 3.8 3.5  CL 106 111 110 103  CO2 28 28 26 27   GLUCOSE 103* 97 115* 92  BUN <5* <5* 5* 5*  CREATININE 0.74 0.78 0.86 0.81  CALCIUM 8.3* 7.9* 7.8* 8.6*  MG 1.9 2.0 1.8  --    Liver Function Tests: No results for input(s): AST, ALT, ALKPHOS, BILITOT, PROT, ALBUMIN in the last 168 hours. No results for input(s): LIPASE, AMYLASE in  the last 168 hours. No results for input(s): AMMONIA in the last 168 hours. CBC: Recent Labs  Lab 06/01/19 0409 06/02/19 0446  WBC 8.1 8.6  HGB 10.8* 10.3*  HCT 34.2* 33.2*  MCV 95.5 96.8  PLT 264 295   Cardiac Enzymes: No results for input(s): CKTOTAL, CKMB, CKMBINDEX, TROPONINI in the last 168 hours. CBG (last 3)  No results for input(s): GLUCAP in the last 72 hours. No  results found for this or any previous visit (from the past 240 hour(s)).   Studies: [place] Ekg Site Rite  Result Date: 06/05/2019 If Site Rite image not attached, placement could not be confirmed due to current cardiac rhythm.  Scheduled Meds: . apixaban  5 mg Oral BID  . balsalazide  2,250 mg Oral TID  . budesonide  9 mg Oral Daily  . buPROPion  150 mg Oral Daily  . busPIRone  7.5 mg Oral BID  . DULoxetine  120 mg Oral Daily  . famotidine  20 mg Oral BID AC  . hydrocortisone  100 mg Rectal QHS  . ketotifen  1 drop Both Eyes Daily  . lidocaine  1 patch Transdermal Q24H  . loratadine  10 mg Oral Daily  . metoprolol tartrate  25 mg Oral BID  . mometasone-formoterol  2 puff Inhalation BID  . pravastatin  20 mg Oral Daily  . pregabalin  100 mg Oral BID  . traZODone  100 mg Oral QHS  . vancomycin  125 mg Oral BID   Followed by  . [START ON 06/12/2019] vancomycin  125 mg Oral Daily   Followed by  . [START ON 06/19/2019] vancomycin  125 mg Oral QODAY   Followed by  . [START ON 06/27/2019] vancomycin  125 mg Oral Q3 days   Continuous Infusions: . 0.9 % NaCl with KCl 40 mEq / L 125 mL/hr (06/06/19 1216)   Active Problems:   Colitis   Enteritis due to Clostridium difficile   Tachycardia   IBD (inflammatory bowel disease)  Time spent:   Irwin Brakeman, MD [facility] 06/06/2019, 12:40 PM    LOS: 16 days  How to contact the [facility] Attending or Consulting provider [place] or covering provider during after hours [place], for this patient?  1. Check the care team in [facility] and look for a) attending/consulting TRH provider listed and b) the [facility] team listed 2. Log into [email or web address] and use [place]'s universal password to access. If you do not have the password, please contact the hospital operator. 3. Locate the [facility] provider you are looking for under [facility] and page to a number that you can be directly reached. 4. If you still have difficulty reaching the provider, please  page the [facility] (Director on Call) for the Hospitalists listed on amion for assistance.

## 2019-06-06 NOTE — Progress Notes (Signed)
   Assessment/Plan: ADMITTED WITH C DIFF COLITIS/UC FLARE. CONTINUE WITH > 10 BMs/DAY. PMHx: IBS   Subjective: Since I last evaluated the patient SHE HAS AT LEAST 10 BMS IN PAST 24 HRS. C/O LEFT POSTERIOR HIP/BACK PAIN. HAVING PAIN/TROUBLE WITH HER MID-LINE.  PLAN: 1. ADD BENTYL BID, 2. CONTINUE HCT/COLAZAL/ENTOCORT/VANCOMYCIN. 3. LACTOSE FREE DIET 4. D/C HOME WHEN WE CAN GET BMs< 5/DAY.  Objective: Vital signs in last 24 hours: Vitals:   06/05/19 2216 06/06/19 0635  BP: (!) 104/53 130/71  Pulse: 76 69  Resp: 18 18  Temp: 98.5 F (36.9 C) 98 F (36.7 C)  SpO2: 91% 99%   General appearance: alert, cooperative and no distress Resp: clear to auscultation bilaterally Cardio: regular rate and rhythm GI: soft, MILDLY tender BLQs; bowel sounds normal;   Lab Results:  K 3.5 Cr 0.81   Studies/Results: NONE  Medications: I have reviewed the patient's current medications.

## 2019-06-06 NOTE — Care Management Important Message (Signed)
Important Message  Patient Details  Name: [name] MRN: [id number] Date of Birth: Nov 12, 1950   Medicare Important Message Given:  Yes(given to nurse to deliver to patient due to contact precautions)     Tommy Medal 06/06/2019, 10:18 AM

## 2019-06-06 NOTE — Progress Notes (Signed)
Per RN cancel PICC placement for now. Will monitor midline at present site. RN to update if needed later.

## 2019-06-06 NOTE — Progress Notes (Signed)
Spoke with Latanya Presser, RN concerning need for PICC line vs PIV. She will have SWOT nurse attempt for PIV and PICC will not be needed if IV access is obtained. Will continue to monitor.

## 2019-06-07 MED ORDER — DICYCLOMINE HCL 10 MG PO CAPS
10.0000 mg | ORAL_CAPSULE | Freq: Two times a day (BID) | ORAL | Status: DC
  Administered 2019-06-08 – 2019-06-15 (×15): 10 mg via ORAL
  Filled 2019-06-07 (×15): qty 1

## 2019-06-07 MED ORDER — DICYCLOMINE HCL 10 MG PO CAPS
10.0000 mg | ORAL_CAPSULE | Freq: Three times a day (TID) | ORAL | Status: DC
  Administered 2019-06-07: 10 mg via ORAL
  Filled 2019-06-07: qty 1

## 2019-06-07 NOTE — Progress Notes (Signed)
PROGRESS NOTE  [name]  ZOX:[id number]  DOB: 15-Apr-1951  DOA: 05/20/2019 PCP: Jalene Mullet, PA-C  Brief Admission Hx: 68 year old female with recent diagnosis of ulcerative colitis presented with C. difficile positive diarrhea, abdominal pain and diffuse pancolitis, followed closely by GI service.  MDM/Assessment & Plan:   1. C diff pancolitis-symptoms persist she has been treated with Cipro and Flagyl which has been discontinued and now being treated with oral vancomycin per GI recommendations.  She is now on a 3 week taper of oral vancomycin per GI service.  2. Dysuria - urinalysis not indicative of infection, follow culture, follow symptoms.   3. Ulcerative pancolitis -GI performed flex sig 9/2 with findings of severe pancolitis and biopsies with severe active chronic colitis with erosions consistent with ulcerative colitis diagnosis.  Started entocort 06/01/19 and now receiving hydrocortisone enemas.  GI planning safe DC home when BMs <5 per day.  4. Transient tachyarrhythmia- patient had some transient A. fib noted has been back in sinus rhythm.  No recurrence noted.  Increased dose of metoprolol from 05/31/2019 is being tolerated 25 mg twice daily.  5. Hypotension - likely secondary to dehydration, increased IVFs and BP improved.  6. Hypokalemia-repleted.  Secondary to ongoing GI losses-continue to replete as needed and follow magnesium. 7. Chronic atrial fibrillation- metoprolol increased to 25 mg twice daily, continue apixaban for full anticoagulation. 8. COPD-continue bronchodilators seems stable at this time. 9. Anxiety/depression- resume home medications.  Stable.  DVT prophylaxis: Apixaban Code Status: Full Family Communication: Husband Disposition Plan: Continue inpatient treatments  Consultants:  GI  Procedures:  Flex sig 06/01/2019  Antimicrobials:  Oral vancomycin  Subjective: Pt reports less diarrhea.  Tolerating diet.     Objective: Vitals:   06/06/19  2058 06/07/19 0500 06/07/19 0559 06/07/19 0805  BP: (!) 128/96  125/83   Pulse: 70  69   Resp: 16  18   Temp: 99.2 F (37.3 C)  98.3 F (36.8 C)   TempSrc: Oral  Oral   SpO2: 96%  99% 98%  Weight:  89 kg    Height:        Intake/Output Summary (Last 24 hours) at 06/07/2019 1223 Last data filed at 06/07/2019 1100 Gross per 24 hour  Intake 2417.55 ml  Output 4 ml  Net 2413.55 ml   Filed Weights   06/05/19 0518 06/06/19 0500 06/07/19 0500  Weight: 93.4 kg 87.1 kg 89 kg   REVIEW OF SYSTEMS  As per history otherwise all reviewed and reported negative  Exam:  General exam: Awake, alert, no apparent distress, cooperative and pleasant. Respiratory system: Clear. No increased work of breathing. Cardiovascular system: S1 & S2 heard. No JVD, murmurs, gallops, clicks or pedal edema. Gastrointestinal system: Abdomen is nondistended, soft and diffuse nonspecific tenderness. Normal bowel sounds heard. Central nervous system: Alert and oriented. No focal neurological deficits. Extremities: no CCE.  Data Reviewed: Basic Metabolic Panel: Recent Labs  Lab 06/01/19 0409 06/02/19 0446 06/05/19 0609  NA 141 141 138  K 3.7 3.8 3.5  CL 111 110 103  CO2 28 26 27   GLUCOSE 97 115* 92  BUN <5* 5* 5*  CREATININE 0.78 0.86 0.81  CALCIUM 7.9* 7.8* 8.6*  MG 2.0 1.8  --    Liver Function Tests: No results for input(s): AST, ALT, ALKPHOS, BILITOT, PROT, ALBUMIN in the last 168 hours. No results for input(s): LIPASE, AMYLASE in the last 168 hours. No results for input(s): AMMONIA in the last 168 hours. CBC: Recent  Labs  Lab 06/01/19 0409 06/02/19 0446  WBC 8.1 8.6  HGB 10.8* 10.3*  HCT 34.2* 33.2*  MCV 95.5 96.8  PLT 264 295   Cardiac Enzymes: No results for input(s): CKTOTAL, CKMB, CKMBINDEX, TROPONINI in the last 168 hours. CBG (last 3)  No results for input(s): GLUCAP in the last 72 hours. No results found for this or any previous visit (from the past 240 hour(s)).   Studies:  [place] Ekg Site Rite  Result Date: 06/05/2019 If Site Rite image not attached, placement could not be confirmed due to current cardiac rhythm.  Scheduled Meds: . apixaban  5 mg Oral BID  . balsalazide  2,250 mg Oral TID  . budesonide  9 mg Oral Daily  . buPROPion  150 mg Oral Daily  . busPIRone  7.5 mg Oral BID  . DULoxetine  120 mg Oral Daily  . famotidine  20 mg Oral BID AC  . hydrocortisone  100 mg Rectal QHS  . ketotifen  1 drop Both Eyes Daily  . lidocaine  1 patch Transdermal Q24H  . loratadine  10 mg Oral Daily  . metoprolol tartrate  25 mg Oral BID  . mometasone-formoterol  2 puff Inhalation BID  . pravastatin  20 mg Oral Daily  . pregabalin  100 mg Oral BID  . traZODone  100 mg Oral QHS  . vancomycin  125 mg Oral BID   Followed by  . [START ON 06/12/2019] vancomycin  125 mg Oral Daily   Followed by  . [START ON 06/19/2019] vancomycin  125 mg Oral QODAY   Followed by  . [START ON 06/27/2019] vancomycin  125 mg Oral Q3 days   Continuous Infusions: . 0.9 % NaCl with KCl 40 mEq / L 125 mL/hr (06/07/19 0520)   Active Problems:   Colitis   Enteritis due to Clostridium difficile   Tachycardia   IBD (inflammatory bowel disease)  Time spent:   Irwin Brakeman, MD [facility] 06/07/2019, 12:23 PM    LOS: 17 days  How to contact the [facility] Attending or Consulting provider [place] or covering provider during after hours [place], for this patient?  1. Check the care team in [facility] and look for a) attending/consulting TRH provider listed and b) the [facility] team listed 2. Log into [email or web address] and use [place]'s universal password to access. If you do not have the password, please contact the hospital operator. 3. Locate the [facility] provider you are looking for under [facility] and page to a number that you can be directly reached. 4. If you still have difficulty reaching the provider, please page the [facility] (Director on Call) for the Hospitalists listed on amion for assistance.

## 2019-06-07 NOTE — Progress Notes (Signed)
Subjective: Patient reports today has been a bad day. Continues to have frequent BMs. Husband in the room and states since he got here around 10:15am, patient has had 12 BMs.  She had 2 additional bowel movements while I was in the room.  Also reports more than 12 BMs yesterday. Stools have very little form, mostly water. She had some improvement in frequency a couple days ago. No improvement in consistency. Stools are with incontinence at times. Continues with bright red blood per rectum. She continues to have intermittent LLQ pain. Pain is typically present after several BMs. Intermittent nausea. No vomiting. Lightheaded yesterday at times. None today.   Objective: Vital signs in last 24 hours: Temp:  [98.2 F (36.8 C)-99.2 F (37.3 C)] 98.2 F (36.8 C) (09/08 1402) Pulse Rate:  [69-72] 72 (09/08 1402) Resp:  [16-18] 16 (09/08 1402) BP: (125-131)/(72-96) 131/72 (09/08 1402) SpO2:  [96 %-99 %] 98 % (09/08 1402) Weight:  [89 kg] 89 kg (09/08 0500) Last BM Date: 06/07/19 General:   Alert and oriented, pleasant Abdomen:  Bowel sounds present, soft, non-distended. Mild tenderness to deep palpation of LLQ. No HSM or hernias noted. No rebound or guarding. No masses appreciated  Extremities:  Without clubbing or edema. Neurologic:  Alert and  oriented x4;  grossly normal neurologically. Skin:  Warm and dry, intact without significant lesions.  Psych:  Alert and cooperative. Normal mood and affect.  Intake/Output from previous day: 09/07 0701 - 09/08 0700 In: 2057.6 [P.O.:720; I.V.:1337.6] Out: -  Intake/Output this shift: Total I/O In: 1080 [P.O.:1080] Out: 4 [Stool:4]  Lab Results: No results for input(s): WBC, HGB, HCT, PLT in the last 72 hours. BMET Recent Labs    06/05/19 0609  NA 138  K 3.5  CL 103  CO2 27  GLUCOSE 92  BUN 5*  CREATININE 0.81  CALCIUM 8.6*    Assessment: 68 year old female with history of pan ulcerative colitis, presenting with C. difficile  infection, initially on vancomycin 125 mg orally 4 times daily but increased to 500 mg orally 4 times daily on August 29.  Colazal was increased to maximum dose on August 27 for concerns for exacerbation of underlying UC.  Patient continued to have persistent watery stools, some bloody mucus, therefore flexible sigmoidoscopy was performed.  Flexible sigmoidoscopy 06/01/2019 with severe proctocolitis most consistent endoscopically with UC.  Suspected exacerbation of underlying IBD with recent C. difficile infection.  Entocort 9 mg daily started on 9/2.  Hyrocortisone enema started on 06/03/19. Questran was attempted, but this caused worsening diarrhea. Was also started on slow Vancomycin taper on 06/04/19. Per Dr. Oneida Alar note yesterday, patient was to start on bentyl on 9/7/2, but it doesn't look like the order was placed.    Patient reports she was having some improvement in stool frequency a couple days ago, but over the last 2 days she has had at least 12 or more watery bowel movements a day.  Continues with hematochezia. The fact that patient has had worsening of frequent watery diarrhea over the last 2 days since decreasing vancomycin dosing does make me wonder about ongoing C. difficile infection versus IBD exacerbation only. Could consider changing Entocort to Uceris as this has more availability throughout the entire colon. Will need to discuss with Dr. Oneida Alar.   Plan: Continue colazal/entocort/and vancomycin taper.  Add Bentyl Continue lactose free diet.  Will need to discuss with Dr. Oneida Alar for further management of ongoing diarrhea.  Goal of < 5 BMs daily.  LOS: 17 days    06/07/2019, 3:32 PM   Aliene Altes, [facility], [place] [facility]

## 2019-06-08 LAB — BASIC METABOLIC PANEL
Anion gap: 3 — ABNORMAL LOW (ref 5–15)
BUN: 5 mg/dL — ABNORMAL LOW (ref 8–23)
CO2: 27 mmol/L (ref 22–32)
Calcium: 8.4 mg/dL — ABNORMAL LOW (ref 8.9–10.3)
Chloride: 107 mmol/L (ref 98–111)
Creatinine, Ser: 0.75 mg/dL (ref 0.44–1.00)
GFR calc Af Amer: >60 mL/min (ref >60)
GFR calc non Af Amer: >60 mL/min (ref >60)
Glucose, Bld: 112 mg/dL — ABNORMAL HIGH (ref 70–99)
Potassium: 5.1 mmol/L (ref 3.5–5.1)
Sodium: 137 mmol/L (ref 135–145)

## 2019-06-08 LAB — HEMOGLOBIN AND HEMATOCRIT, BLOOD
HCT: 37.2 % (ref 36.0–46.0)
Hemoglobin: 11.6 g/dL — ABNORMAL LOW (ref 12.0–15.0)

## 2019-06-08 LAB — MAGNESIUM: Magnesium: 2 mg/dL (ref 1.7–2.4)

## 2019-06-08 MED ORDER — LACTATED RINGERS IV SOLN
INTRAVENOUS | Status: DC
  Administered 2019-06-08: 14:00:00 via INTRAVENOUS

## 2019-06-08 NOTE — Progress Notes (Signed)
Patient has home CPAP unit at bedside. Patient places self on and off when ready.

## 2019-06-08 NOTE — Progress Notes (Signed)
PROGRESS NOTE    [name]  ZOX:[id number] DOB: Nov 17, 1950 DOA: 05/20/2019 PCP: Jalene Mullet, PA-C   Brief Narrative:  68 year old female with recent diagnosis of ulcerative colitis presented with C. difficile positive diarrhea, abdominal pain and diffuse pancolitis, followed closely by GI service.  9/9: Patient continues to have frequent watery bowel movements today.  Potassium levels are high-normal and will stop supplementation and follow labs in a.m.  Assessment & Plan:   Active Problems:   Colitis   Enteritis due to Clostridium difficile   Tachycardia   IBD (inflammatory bowel disease)   C. difficile pancolitis in the setting of ulcerative colitis with associated flare-persistent -Continue oral vancomycin taper per GI -Continue colazal, Entocort, started 06/01/2019 as well as hydrocortisone enemas -Patient would likely require colonoscopy soon -Continue on lactulose free full liquid diet with goal of less than 5 BMs daily -Continue Bentyl twice daily  Dysuria-resolved -UA without signs of infection  Transient tachyarrhythmia in the setting of chronic atrial fibrillation -Continue to monitor closely as she is currently in sinus rhythm -Continue increased dose of metoprolol 25 twice daily -Continue a apixaban for full anticoagulation  Hypotension-improved -Likely secondary to dehydration that is difficult to treat in the setting of frequent BMs -Continue IV fluid  COPD -Currently stable and use bronchodilators as needed  Anxiety/depression -Continue home medication  DVT prophylaxis:Apixaban Code Status: Full Family Communication: Husband at bedside Disposition Plan: Continue IVF hydration and current treatments per GI.   Consultants:   GI  Procedures:   Flex sig 06/01/19  Antimicrobials:  Anti-infectives (From admission, onward)   Start     Dose/Rate Route Frequency Ordered Stop   06/27/19 1000  vancomycin (VANCOCIN) 50 mg/mL oral solution 125 mg      125 mg Oral Every 3 DAYS 06/02/19 1201 07/06/19 0959   06/19/19 1000  vancomycin (VANCOCIN) 50 mg/mL oral solution 125 mg     125 mg Oral Every other day 06/02/19 1201 06/27/19 0959   06/12/19 1000  vancomycin (VANCOCIN) 50 mg/mL oral solution 125 mg     125 mg Oral Daily 06/02/19 1201 06/19/19 0959   06/04/19 2200  vancomycin (VANCOCIN) 50 mg/mL oral solution 125 mg     125 mg Oral 2 times daily 06/02/19 1201 06/11/19 2159   06/02/19 1400  vancomycin (VANCOCIN) 50 mg/mL oral solution 125 mg     125 mg Oral 4 times daily 06/02/19 1201 06/04/19 0934   06/02/19 1000  fluconazole (DIFLUCAN) tablet 150 mg     150 mg Oral Daily 06/02/19 0854 06/03/19 0945   05/28/19 1800  vancomycin (VANCOCIN) 50 mg/mL oral solution 500 mg  Status:  Discontinued     500 mg Oral 4 times daily 05/28/19 1605 06/02/19 1201   05/22/19 1400  vancomycin (VANCOCIN) 50 mg/mL oral solution 125 mg  Status:  Discontinued     125 mg Oral 4 times daily 05/22/19 1233 05/28/19 1605   05/21/19 0800  ciprofloxacin (CIPRO) IVPB 400 mg  Status:  Discontinued     400 mg 200 mL/hr over 60 Minutes Intravenous Every 12 hours 05/21/19 0213 05/22/19 1233   05/21/19 0400  metroNIDAZOLE (FLAGYL) IVPB 500 mg  Status:  Discontinued     500 mg 100 mL/hr over 60 Minutes Intravenous Every 8 hours 05/21/19 0213 05/22/19 1233   05/20/19 2000  ciprofloxacin (CIPRO) tablet 500 mg  Status:  Discontinued     500 mg Oral  Once 05/20/19 1951 05/20/19 1952   05/20/19 2000  metroNIDAZOLE (FLAGYL) tablet 500 mg  Status:  Discontinued     500 mg Oral  Once 05/20/19 1951 05/20/19 1952   05/20/19 2000  ciprofloxacin (CIPRO) IVPB 400 mg     400 mg 200 mL/hr over 60 Minutes Intravenous  Once 05/20/19 1952 05/20/19 2147   05/20/19 2000  metroNIDAZOLE (FLAGYL) IVPB 500 mg     500 mg 100 mL/hr over 60 Minutes Intravenous  Once 05/20/19 1952 05/20/19 2148       Subjective: Patient seen and evaluated today with ongoing frequent and watery bowel  movements.  She has intermittent nausea and is tolerating only minimal amounts of her full liquid diet as well.  She also has left lower quadrant abdominal pain.  Objective: Vitals:   06/07/19 2147 06/08/19 0441 06/08/19 0500 06/08/19 0759  BP: 135/66 115/65    Pulse: 76 61    Resp:  18    Temp:  98.6 F (37 C)    TempSrc:  Oral    SpO2:  98%  94%  Weight:   84.9 kg   Height:        Intake/Output Summary (Last 24 hours) at 06/08/2019 1254 Last data filed at 06/08/2019 0900 Gross per 24 hour  Intake 3656.09 ml  Output 2 ml  Net 3654.09 ml   Filed Weights   06/06/19 0500 06/07/19 0500 06/08/19 0500  Weight: 87.1 kg 89 kg 84.9 kg    Examination:  General exam: Appears calm and comfortable  Respiratory system: Clear to auscultation. Respiratory effort normal. Cardiovascular system: S1 & S2 heard, RRR. No JVD, murmurs, rubs, gallops or clicks. No pedal edema. Gastrointestinal system: Abdomen is nondistended, soft and nontender. No organomegaly or masses felt. Normal bowel sounds heard. Central nervous system: Alert and oriented. No focal neurological deficits. Extremities: Symmetric 5 x 5 power. Skin: No rashes, lesions or ulcers Psychiatry: Judgement and insight appear normal. Mood & affect appropriate.     Data Reviewed: I have personally reviewed following labs and imaging studies  CBC: Recent Labs  Lab 06/02/19 0446 06/08/19 1107  WBC 8.6  --   HGB 10.3* 11.6*  HCT 33.2* 37.2  MCV 96.8  --   PLT 295  --    Basic Metabolic Panel: Recent Labs  Lab 06/02/19 0446 06/05/19 0609 06/08/19 0456  NA 141 138 137  K 3.8 3.5 5.1  CL 110 103 107  CO2 26 27 27   GLUCOSE 115* 92 112*  BUN 5* 5* 5*  CREATININE 0.86 0.81 0.75  CALCIUM 7.8* 8.6* 8.4*  MG 1.8  --  2.0   GFR: Estimated Creatinine Clearance: 70 mL/min (by C-G formula based on SCr of 0.75 mg/dL). Liver Function Tests: No results for input(s): AST, ALT, ALKPHOS, BILITOT, PROT, ALBUMIN in the last 168  hours. No results for input(s): LIPASE, AMYLASE in the last 168 hours. No results for input(s): AMMONIA in the last 168 hours. Coagulation Profile: No results for input(s): INR, PROTIME in the last 168 hours. Cardiac Enzymes: No results for input(s): CKTOTAL, CKMB, CKMBINDEX, TROPONINI in the last 168 hours. BNP (last 3 results) No results for input(s): PROBNP in the last 8760 hours. HbA1C: No results for input(s): HGBA1C in the last 72 hours. CBG: No results for input(s): GLUCAP in the last 168 hours. Lipid Profile: No results for input(s): CHOL, HDL, LDLCALC, TRIG, CHOLHDL, LDLDIRECT in the last 72 hours. Thyroid Function Tests: No results for input(s): TSH, T4TOTAL, FREET4, T3FREE, THYROIDAB in the last 72 hours. Anemia Panel: No  results for input(s): VITAMINB12, FOLATE, FERRITIN, TIBC, IRON, RETICCTPCT in the last 72 hours. Sepsis Labs: No results for input(s): PROCALCITON, LATICACIDVEN in the last 168 hours.  No results found for this or any previous visit (from the past 240 hour(s)).       Radiology Studies: No results found.      Scheduled Meds: . apixaban  5 mg Oral BID  . balsalazide  2,250 mg Oral TID  . budesonide  9 mg Oral Daily  . buPROPion  150 mg Oral Daily  . busPIRone  7.5 mg Oral BID  . dicyclomine  10 mg Oral BID AC  . DULoxetine  120 mg Oral Daily  . famotidine  20 mg Oral BID AC  . hydrocortisone  100 mg Rectal QHS  . ketotifen  1 drop Both Eyes Daily  . lidocaine  1 patch Transdermal Q24H  . loratadine  10 mg Oral Daily  . metoprolol tartrate  25 mg Oral BID  . mometasone-formoterol  2 puff Inhalation BID  . pravastatin  20 mg Oral Daily  . pregabalin  100 mg Oral BID  . traZODone  100 mg Oral QHS  . vancomycin  125 mg Oral BID   Followed by  . [START ON 06/12/2019] vancomycin  125 mg Oral Daily   Followed by  . [START ON 06/19/2019] vancomycin  125 mg Oral QODAY   Followed by  . [START ON 06/27/2019] vancomycin  125 mg Oral Q3 days    Continuous Infusions: . lactated ringers       LOS: 18 days    Time spent: 30 minutes    Alizandra Loh Darleen Crocker, DO [facility] Pager [phone]  If 7PM-7AM, please contact night-coverage [email or web address] Password [email or web address] 06/08/2019, 12:54 PM

## 2019-06-08 NOTE — Progress Notes (Addendum)
    Subjective: BMs are too many to count in the last 24 hours.  Thanks she probably has had 1 BM an hour. Reports this is somewhat worse than yesterday. Still watery with a few pieces. Continues with intermittent nausea. No vomiting. Is on full liquids at this time. LLQ abdominal pain continues. No change. Continues with rectal bleeding.   Objective: Vital signs in last 24 hours: Temp:  [98.2 F (36.8 C)-99.4 F (37.4 C)] 98.6 F (37 C) (09/09 0441) Pulse Rate:  [61-76] 61 (09/09 0441) Resp:  [16-18] 18 (09/09 0441) BP: (115-135)/(63-98) 115/65 (09/09 0441) SpO2:  [94 %-98 %] 94 % (09/09 0759) Weight:  [84.9 kg] 84.9 kg (09/09 0500) Last BM Date: 06/08/19 General:   Alert and oriented, pleasant Abdomen:  Bowel sounds present, soft, non-distended. Moderate tenderness to palpation in the LLQ. Some "soreness" extending over to midline with palpation. No HSM or hernias noted. No rebound or guarding. No masses appreciated  Extremities:  Without clubbing or edema. Neurologic:  Alert and  oriented x4;  grossly normal neurologically. Skin:  Warm and dry, intact without significant lesions.  Psych:  Normal mood and affect.  Intake/Output from previous day: 09/08 0701 - 09/09 0700 In: 3776.1 [P.O.:1200; I.V.:2576.1] Out: 8 [Stool:8] Intake/Output this shift: Total I/O In: 480 [P.O.:480] Out: -   Lab Results: Recent Labs    06/08/19 1107  HGB 11.6*  HCT 37.2   BMET Recent Labs    06/08/19 0456  NA 137  K 5.1  CL 107  CO2 27  GLUCOSE 112*  BUN 5*  CREATININE 0.75  CALCIUM 8.4*    Assessment: 68 year old female with history of pan ulcerative colitis, presenting with C. difficile infection, initially on vancomycin 125 mg orally 4 times daily but increased to 500 mg orally 4 times daily on August 29.Colazal was increased to maximum doseon August 27 for concerns for exacerbation of underlying UC.  Patient continued to have persistent watery stools with associated bright red  blood per rectum: Therefore, flexible sigmoidoscopy was performed.  Flexible sigmoidoscopy 06/01/2019 with severe proctocolitis most consistent endoscopically with UC. Suspected exacerbation of underlying IBD with recent C. difficile infection. Entocort 9 mg daily started on 9/2. Vancomycin was decreased to 125 mg QID on 9/3.Hyrocortisone enema started on 06/03/19. Questran was attempted, but this caused worsening diarrhea.  Slow Vancomycin taper was started on 06/04/19. Currently receiving Vanc 125 mg BID. Bentyl BID was added on 06/07/19  She continues with watery, bloody, BMs too numerous to count. Hemoglobin and electrolytes stable. Over the last 24 hours, she reports about 1 BM an hour, which is somewhat worse than yesterday. It is not clear if patient has ongoing C. diff infection as she has started the vancomycin taper and is having some increase in stool frequency or if this is solely related to UC exacerbation at this point. I suspect patient may need repeat colonoscopy on Friday as she is not improving. Will need to discuss with Dr. Oneida Alar the role of any medication adjustments.   Plan: Continue colazal/entocort/and vancomycin taper.  Continue Bentyl BID Continue lactose free full liquid diet.  Will discuss further with Dr. Oneida Alar Goal of < 5 BMs daily.    LOS: 18 days    06/08/2019, 12:12 PM   Aliene Altes, [facility]

## 2019-06-09 LAB — BASIC METABOLIC PANEL
Anion gap: 6 (ref 5–15)
BUN: <5 mg/dL — ABNORMAL LOW (ref 8–23)
CO2: 27 mmol/L (ref 22–32)
Calcium: 8.5 mg/dL — ABNORMAL LOW (ref 8.9–10.3)
Chloride: 105 mmol/L (ref 98–111)
Creatinine, Ser: 0.78 mg/dL (ref 0.44–1.00)
GFR calc Af Amer: >60 mL/min (ref >60)
GFR calc non Af Amer: >60 mL/min (ref >60)
Glucose, Bld: 106 mg/dL — ABNORMAL HIGH (ref 70–99)
Potassium: 3.9 mmol/L (ref 3.5–5.1)
Sodium: 138 mmol/L (ref 135–145)

## 2019-06-09 LAB — MAGNESIUM: Magnesium: 2.1 mg/dL (ref 1.7–2.4)

## 2019-06-09 MED ORDER — SODIUM CHLORIDE 0.45 % IV SOLN
INTRAVENOUS | Status: DC
  Administered 2019-06-09 – 2019-06-12 (×4): via INTRAVENOUS
  Filled 2019-06-09 (×9): qty 1000

## 2019-06-09 MED ORDER — LIDOCAINE 5 % EX PTCH
1.0000 | MEDICATED_PATCH | CUTANEOUS | Status: DC
  Administered 2019-06-10 – 2019-06-14 (×5): 1 via TRANSDERMAL
  Filled 2019-06-09 (×5): qty 1

## 2019-06-09 NOTE — Progress Notes (Signed)
PROGRESS NOTE    [name]  XBJ:[id number] DOB: [birth year] DOA: 05/20/2019 PCP: Jalene Mullet, PA-C   Brief Narrative:  68 year old female with recent diagnosis of ulcerative colitis presented with C. difficile positive diarrhea, abdominal pain and diffuse pancolitis, followed closely by GI service.  9/9: Patient continues to have frequent watery bowel movements today.  Potassium levels are high-normal and will stop supplementation and follow labs in a.m.  9/10: Patient continues to have frequent watery bowel movements at least greater than 10 in a 24-hour period.  Potassium is once again downtrending.  She is tolerating some of her diet.  GI plans to get CMV stain and is considering colonoscopy in a.m.  Assessment & Plan:   Active Problems:   Colitis   Enteritis due to Clostridium difficile   Tachycardia   IBD (inflammatory bowel disease)   C. difficile pancolitis in the setting of ulcerative colitis with associated flare-persistent -Continue oral vancomycin taper per GI -Continue colazal, Entocort, started 06/01/2019 as well as hydrocortisone enemas -Patient would likely require colonoscopy soon, considering for 9/11 -Continue on lactose free full liquid diet with goal of less than 5 BMs daily -Continue Bentyl twice daily -CMV staining ordered by GI with results pending  Dysuria-resolved -UA without signs of infection  Transient tachyarrhythmia in the setting of chronic atrial fibrillation -Continue to monitor closely as she is currently in sinus rhythm -Continue increased dose of metoprolol 25 twice daily -Continue a apixaban for full anticoagulation  Hypotension-improved -Likely secondary to dehydration that is difficult to treat in the setting of frequent BMs -Continue IV fluid  COPD -Currently stable and use bronchodilators as needed  Anxiety/depression -Continue home medication  Recurrent hypokalemia -Currently within normal limits, but downtrending  -We will resume IV fluid with potassium supplementation today and recheck labs in a.m.  DVT prophylaxis:Apixaban Code Status: Full Family Communication: Husband at bedside Disposition Plan: Continue IVF hydration and current treatments per GI.  Possible colonoscopy in a.m.   Consultants:   GI  Procedures:   Flex sig 06/01/19  Antimicrobials:  Anti-infectives (From admission, onward)   Start     Dose/Rate Route Frequency Ordered Stop   06/27/19 1000  vancomycin (VANCOCIN) 50 mg/mL oral solution 125 mg     125 mg Oral Every 3 DAYS 06/02/19 1201 07/06/19 0959   06/19/19 1000  vancomycin (VANCOCIN) 50 mg/mL oral solution 125 mg     125 mg Oral Every other day 06/02/19 1201 06/27/19 0959   06/12/19 1000  vancomycin (VANCOCIN) 50 mg/mL oral solution 125 mg     125 mg Oral Daily 06/02/19 1201 06/19/19 0959   06/04/19 2200  vancomycin (VANCOCIN) 50 mg/mL oral solution 125 mg     125 mg Oral 2 times daily 06/02/19 1201 06/11/19 2159   06/02/19 1400  vancomycin (VANCOCIN) 50 mg/mL oral solution 125 mg     125 mg Oral 4 times daily 06/02/19 1201 06/04/19 0934   06/02/19 1000  fluconazole (DIFLUCAN) tablet 150 mg     150 mg Oral Daily 06/02/19 0854 06/03/19 0945   05/28/19 1800  vancomycin (VANCOCIN) 50 mg/mL oral solution 500 mg  Status:  Discontinued     500 mg Oral 4 times daily 05/28/19 1605 06/02/19 1201   05/22/19 1400  vancomycin (VANCOCIN) 50 mg/mL oral solution 125 mg  Status:  Discontinued     125 mg Oral 4 times daily 05/22/19 1233 05/28/19 1605   05/21/19 0800  ciprofloxacin (CIPRO) IVPB 400 mg  Status:  Discontinued     400 mg 200 mL/hr over 60 Minutes Intravenous Every 12 hours 05/21/19 0213 05/22/19 1233   05/21/19 0400  metroNIDAZOLE (FLAGYL) IVPB 500 mg  Status:  Discontinued     500 mg 100 mL/hr over 60 Minutes Intravenous Every 8 hours 05/21/19 0213 05/22/19 1233   05/20/19 2000  ciprofloxacin (CIPRO) tablet 500 mg  Status:  Discontinued     500 mg Oral  Once  05/20/19 1951 05/20/19 1952   05/20/19 2000  metroNIDAZOLE (FLAGYL) tablet 500 mg  Status:  Discontinued     500 mg Oral  Once 05/20/19 1951 05/20/19 1952   05/20/19 2000  ciprofloxacin (CIPRO) IVPB 400 mg     400 mg 200 mL/hr over 60 Minutes Intravenous  Once 05/20/19 1952 05/20/19 2147   05/20/19 2000  metroNIDAZOLE (FLAGYL) IVPB 500 mg     500 mg 100 mL/hr over 60 Minutes Intravenous  Once 05/20/19 1952 05/20/19 2148       Subjective: Patient seen and evaluated today with ongoing frequent, watery bowel movements noted.  She states she has had at least greater than 10 bowel movements in the last 24 hours.  Objective: Vitals:   06/08/19 2108 06/09/19 0435 06/09/19 0800 06/09/19 0851  BP: 127/72 123/85  123/78  Pulse: 65 88  71  Resp: 17 17  14   Temp: 98.2 F (36.8 C) 97.8 F (36.6 C)  98.3 F (36.8 C)  TempSrc:  Oral  Oral  SpO2: 97% 94% 99% 99%  Weight:  90.2 kg    Height:        Intake/Output Summary (Last 24 hours) at 06/09/2019 1110 Last data filed at 06/09/2019 0603 Gross per 24 hour  Intake 2066.64 ml  Output -  Net 2066.64 ml   Filed Weights   06/07/19 0500 06/08/19 0500 06/09/19 0435  Weight: 89 kg 84.9 kg 90.2 kg    Examination:  General exam: Appears calm and comfortable  Respiratory system: Clear to auscultation. Respiratory effort normal. Cardiovascular system: S1 & S2 heard, RRR. No JVD, murmurs, rubs, gallops or clicks. No pedal edema. Gastrointestinal system: Abdomen is nondistended, soft and nontender. No organomegaly or masses felt. Normal bowel sounds heard. Central nervous system: Alert and oriented. No focal neurological deficits. Extremities: Symmetric 5 x 5 power. Skin: No rashes, lesions or ulcers Psychiatry: Judgement and insight appear normal. Mood & affect appropriate.     Data Reviewed: I have personally reviewed following labs and imaging studies  CBC: Recent Labs  Lab 06/08/19 1107  HGB 11.6*  HCT 15.7   Basic Metabolic  Panel: Recent Labs  Lab 06/05/19 0609 06/08/19 0456 06/09/19 0515  NA 138 137 138  K 3.5 5.1 3.9  CL 103 107 105  CO2 27 27 27   GLUCOSE 92 112* 106*  BUN 5* 5* <5*  CREATININE 0.81 0.75 0.78  CALCIUM 8.6* 8.4* 8.5*  MG  --  2.0 2.1   GFR: Estimated Creatinine Clearance: 72.2 mL/min (by C-G formula based on SCr of 0.78 mg/dL). Liver Function Tests: No results for input(s): AST, ALT, ALKPHOS, BILITOT, PROT, ALBUMIN in the last 168 hours. No results for input(s): LIPASE, AMYLASE in the last 168 hours. No results for input(s): AMMONIA in the last 168 hours. Coagulation Profile: No results for input(s): INR, PROTIME in the last 168 hours. Cardiac Enzymes: No results for input(s): CKTOTAL, CKMB, CKMBINDEX, TROPONINI in the last 168 hours. BNP (last 3 results) No results for input(s): PROBNP in the last 8760 hours.  HbA1C: No results for input(s): HGBA1C in the last 72 hours. CBG: No results for input(s): GLUCAP in the last 168 hours. Lipid Profile: No results for input(s): CHOL, HDL, LDLCALC, TRIG, CHOLHDL, LDLDIRECT in the last 72 hours. Thyroid Function Tests: No results for input(s): TSH, T4TOTAL, FREET4, T3FREE, THYROIDAB in the last 72 hours. Anemia Panel: No results for input(s): VITAMINB12, FOLATE, FERRITIN, TIBC, IRON, RETICCTPCT in the last 72 hours. Sepsis Labs: No results for input(s): PROCALCITON, LATICACIDVEN in the last 168 hours.  No results found for this or any previous visit (from the past 240 hour(s)).       Radiology Studies: No results found.      Scheduled Meds: . apixaban  5 mg Oral BID  . balsalazide  2,250 mg Oral TID  . budesonide  9 mg Oral Daily  . buPROPion  150 mg Oral Daily  . busPIRone  7.5 mg Oral BID  . dicyclomine  10 mg Oral BID AC  . DULoxetine  120 mg Oral Daily  . famotidine  20 mg Oral BID AC  . hydrocortisone  100 mg Rectal QHS  . ketotifen  1 drop Both Eyes Daily  . lidocaine  1 patch Transdermal Q24H  . loratadine   10 mg Oral Daily  . metoprolol tartrate  25 mg Oral BID  . mometasone-formoterol  2 puff Inhalation BID  . pravastatin  20 mg Oral Daily  . pregabalin  100 mg Oral BID  . traZODone  100 mg Oral QHS  . vancomycin  125 mg Oral BID   Followed by  . [START ON 06/12/2019] vancomycin  125 mg Oral Daily   Followed by  . [START ON 06/19/2019] vancomycin  125 mg Oral QODAY   Followed by  . [START ON 06/27/2019] vancomycin  125 mg Oral Q3 days   Continuous Infusions: . sodium chloride 0.45 % with kcl 75 mL/hr at 06/09/19 0942     LOS: 19 days    Time spent: 30 minutes    Jacksen Isip D Manuella Ghazi, DO [facility] Pager [phone]  If 7PM-7AM, please contact night-coverage [email or web address] Password [email or web address] 06/09/2019, 11:10 AM

## 2019-06-09 NOTE — TOC Initial Note (Signed)
Transition of Care [facility]) - Initial/Assessment Note    Patient Details  Name: [name] MRN: [id number] Date of Birth: 1951/02/27  [facility] CM/SW Contact:    Dravyn Severs, Chauncey Reading, RN Phone Number: 06/09/2019, 2:01 PM  Clinical Narrative:    Ulcerative colitis presented with C. difficile positive diarrhea, abdominal pain and diffuse pancolitis. Readmission score high due to active RX's.  Patient from home, independent, uses cane. Lives with husband. No current home health. Still drives. No issues obtaining medications. Patient has had a long admission with potential for colonoscopy in the near future. TOC will follow for needs.         Expected Discharge Plan: Home/Self Care Barriers to Discharge: No Barriers Identified   Patient Goals and CMS Choice Patient states their goals for this hospitalization and ongoing recovery are:: return home and be pain free and free of infection      Expected Discharge Plan and Services Expected Discharge Plan: Home/Self Care   Discharge Planning Services: CM Consult   Living arrangements for the past 2 months: Single Family Home                     Prior Living Arrangements/Services Living arrangements for the past 2 months: Single Family Home Lives with:: Spouse Patient language and need for interpreter reviewed:: Yes        Need for Family Participation in Patient Care: Yes (Comment) Care giver support system in place?: Yes (comment) Current home services: DME(cane) Criminal Activity/Legal Involvement Pertinent to Current Situation/Hospitalization: No - Comment as needed  Activities of Daily Living Home Assistive Devices/Equipment: CBG Meter, Cane (specify quad or straight) ADL Screening (condition at time of admission) Patient's cognitive ability adequate to safely complete daily activities?: Yes Is the patient deaf or have difficulty hearing?: No Does the patient have difficulty seeing, even when wearing  glasses/contacts?: No Does the patient have difficulty concentrating, remembering, or making decisions?: No Patient able to express need for assistance with ADLs?: No Does the patient have difficulty dressing or bathing?: No Independently performs ADLs?: Yes (appropriate for developmental age) Does the patient have difficulty walking or climbing stairs?: Yes Weakness of Legs: Left Weakness of Arms/Hands: None      Emotional Assessment   Attitude/Demeanor/Rapport: Engaged Affect (typically observed): Accepting Orientation: : Oriented to Self, Oriented to Place, Oriented to  Time, Oriented to Situation Alcohol / Substance Use: Not Applicable Psych Involvement: No (comment)  Admission diagnosis:  Colitis [K52.9] Tachycardia [R00.0] Diarrhea, unspecified type [R19.7] Patient Active Problem List   Diagnosis Date Noted  . IBD (inflammatory bowel disease)   . Tachycardia   . Enteritis due to Clostridium difficile   . Colitis 05/21/2019  . IDA (iron deficiency anemia) 08/11/2018  . Diarrhea 01/06/2018  . Primary localized osteoarthritis of right knee 12/11/2015  . 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  . Hiatal hernia   . Schatzki's ring   . Degenerative tear of posterior horn of lateral meniscus of right knee 01/27/2014  . Ulcerative colitis ([place]) 01/09/2011  . Dysphagia 01/09/2011  . CONSTIPATION 08/01/2009  . LLQ pain 08/01/2009  . GERD 06/14/2009  . ULCERATIVE COLITIS--UNIVERSAL ULCERATIVE COLITIS 06/14/2009   PCP:  Jalene Mullet, PA-C Pharmacy:   [place], [place] [address] [place] [address] Phone: [phone] Fax: [phone]  [facility] [place], [place].  [place] [address] Phone: [phone] Fax: [phone]     Social Determinants of Health (SDOH) Interventions    Readmission Risk Interventions Readmission Risk Prevention Plan  06/09/2019 05/27/2019  Post Dischage Appt - Complete  Transportation Screening Complete -  HRI or Home Care Consult Not Complete -  HRI or Home Care Consult comments too soon to know if patient will need any services, at baseline she does not -  Social Work Consult for [place] Planning/Counseling Complete -  [place] Screening Not Applicable -  Medication Review Press photographer) Complete -  Some recent data might be hidden

## 2019-06-09 NOTE — Progress Notes (Signed)
Subjective:  Recorded 16 stools in last 24 hours. Most of those early AM yesterday at least 6. Some large loose stools, others smaller amount. Some fresh blood. Three during middle of night. Abdominal pain improved. Tolerating POs.   Objective: Vital signs in last 24 hours: Temp:  [97.8 F (36.6 C)-98.3 F (36.8 C)] 98.3 F (36.8 C) (09/10 0851) Pulse Rate:  [65-88] 71 (09/10 0851) Resp:  [14-19] 14 (09/10 0851) BP: (116-127)/(62-85) 123/78 (09/10 0851) SpO2:  [94 %-99 %] 99 % (09/10 0851) Weight:  [90.2 kg] 90.2 kg (09/10 0435) Last BM Date: 06/09/19 General:   Alert,  Well-developed, well-nourished, pleasant and cooperative in NAD Head:  Normocephalic and atraumatic. Eyes:  Sclera clear, no icterus.  Abdomen:  Soft, nontender and nondistended.  Normal bowel sounds, without guarding, and without rebound.   Extremities:  Without clubbing, deformity or edema. Neurologic:  Alert and  oriented x4;  grossly normal neurologically. Skin:  Intact without significant lesions or rashes. Psych:  Alert and cooperative. Normal mood and affect.  Intake/Output from previous day: 09/09 0701 - 09/10 0700 In: 2546.6 [P.O.:1320; I.V.:1226.6] Out: -  Intake/Output this shift: No intake/output data recorded.  Lab Results: CBC Recent Labs    06/08/19 1107  HGB 11.6*  HCT 37.2   BMET Recent Labs    06/08/19 0456 06/09/19 0515  NA 137 138  K 5.1 3.9  CL 107 105  CO2 27 27  GLUCOSE 112* 106*  BUN 5* <5*  CREATININE 0.75 0.78  CALCIUM 8.4* 8.5*   LFTs No results for input(s): BILITOT, BILIDIR, IBILI, ALKPHOS, AST, ALT, PROT, ALBUMIN in the last 72 hours. No results for input(s): LIPASE in the last 72 hours. PT/INR No results for input(s): LABPROT, INR in the last 72 hours.    Imaging Studies: Dg Chest 2 View  Result Date: 05/20/2019 CLINICAL DATA:  Abdominal cramping with nausea fever. EXAM: CHEST - 2 VIEW COMPARISON:  02/26/2018 FINDINGS: Cardiopericardial silhouette is at upper  limits of normal for size. Hiatal hernia noted. The lungs are clear without focal pneumonia, edema, pneumothorax or pleural effusion. Interstitial markings are diffusely coarsened with chronic features. The visualized bony structures of the thorax are intact. IMPRESSION: 1. No acute cardiopulmonary findings 2. Hiatal hernia. Electronically Signed   By: Misty Stanley M.D.   On: 05/20/2019 19:10   Ct Abdomen Pelvis W Contrast  Result Date: 05/20/2019 CLINICAL DATA:  Abdominal pain with diarrhea. EXAM: CT ABDOMEN AND PELVIS WITH CONTRAST TECHNIQUE: Multidetector CT imaging of the abdomen and pelvis was performed using the standard protocol following bolus administration of intravenous contrast. CONTRAST:  118m OMNIPAQUE IOHEXOL 300 MG/ML  SOLN COMPARISON:  05/01/2016 FINDINGS: Lower chest:  Large hiatal hernia. Hepatobiliary: No suspicious focal abnormality within the liver parenchyma. Gallbladder is surgically absent. No intrahepatic or extrahepatic biliary dilation. Pancreas: No focal mass lesion. No dilatation of the main duct. No intraparenchymal cyst. No peripancreatic edema. Spleen: No splenomegaly. No focal mass lesion. Adrenals/Urinary Tract: No adrenal nodule or mass. 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 Right kidney unremarkable. The urinary bladder appears normal for the degree of distention. Stomach/Bowel: Large hiatal hernia with approximately 75-80% of the stomach in the chest. Duodenum is normally positioned as is the ligament of Treitz. No small bowel wall thickening. No small bowel dilatation. Cecum is positioned in the anterior midline with unremarkable terminal ileum The appendix is not visualized, but there is no edema or inflammation in the region  of the cecum. There is diffuse mild colonic wall thickening, most prominent in the sigmoid segment but extending from the ascending colon to the level of the rectum. This is associated  with pericolonic edema/inflammation. Diverticular changes are noted in the left colon. Vascular/Lymphatic: There is abdominal aortic atherosclerosis without aneurysm. Celiac axis, SMA, and IMA are opacified. Portal vein and superior mesenteric vein are patent. There is no gastrohepatic or hepatoduodenal ligament lymphadenopathy. No intraperitoneal or retroperitoneal lymphadenopathy. No pelvic sidewall lymphadenopathy. Reproductive: The uterus is unremarkable.  There is no adnexal mass. Other: No intraperitoneal free fluid. Musculoskeletal: No worrisome lytic or sclerotic osseous abnormality. IMPRESSION: 1. Diffuse mild colonic wall thickening with pericolonic edema/inflammation. Imaging features are compatible with an infectious/inflammatory colitis. 2. Large hiatal hernia. 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 4. Abdominal aortic atherosclerosis. Aortic Atherosclerosis (ICD10-I70.0). Electronically Signed   By: Misty Stanley M.D.   On: 05/20/2019 19:06   [place] Ekg Site Rite  Result Date: 06/05/2019 If Site Rite image not attached, placement could not be confirmed due to current cardiac rhythm. 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 difficile infection back on August 21.  Initially on vancomycin 125 mg 4 times daily orally, increased to 500 mg orally 4 times a day on August 29. Colazal increased to maximum dose on August 27 for concerns of exacerbation of underlying UC.  Patient had flexible sigmoidoscopy on 9 2 for persistent watery stools with blood per rectum.  She was noted to have severe proctocolitis most consistent endoscopically with UC.  Suspected exacerbation of underlying IBD with recent C. difficile infection.  Started on Entocort 9 mg daily on September 2.  Vancomycin decreased to 125 mg 4 times daily on September 3.  Hydrocortisone enema started September 4.  Questran was attempted  but caused worsening diarrhea.  Slow vancomycin taper started September 5.  Currently receiving vancomycin 125 mg twice daily, Bentyl twice daily added on September 8.  Continues to have frequent loose bloody stools.  No significant change in her symptoms. Unclear if UC flare, refractory Cdiff other other etiology ie underlying CMV infection. Dr. Gala Romney requesting special stains for CMV given patient's prolonged illness.    Plan: 1. Spoke with Dr. Vic Ripper, [facility], will add special stains for CMV per Dr. Roseanne Kaufman request. Results would be available tomorrow afternoon.  2. Patient may still require colonoscopy tomorrow, to discuss with Dr. Oneida Alar.   Laureen Ochs. Bernarda Caffey [facility] [phone] 9/10/202010:59 AM     LOS: 19 days

## 2019-06-10 DIAGNOSIS — K921 Melena: Secondary | ICD-10-CM

## 2019-06-10 LAB — BASIC METABOLIC PANEL
Anion gap: 7 (ref 5–15)
BUN: <5 mg/dL — ABNORMAL LOW (ref 8–23)
CO2: 25 mmol/L (ref 22–32)
Calcium: 8.3 mg/dL — ABNORMAL LOW (ref 8.9–10.3)
Chloride: 106 mmol/L (ref 98–111)
Creatinine, Ser: 0.83 mg/dL (ref 0.44–1.00)
GFR calc Af Amer: >60 mL/min (ref >60)
GFR calc non Af Amer: >60 mL/min (ref >60)
Glucose, Bld: 103 mg/dL — ABNORMAL HIGH (ref 70–99)
Potassium: 4.3 mmol/L (ref 3.5–5.1)
Sodium: 138 mmol/L (ref 135–145)

## 2019-06-10 LAB — C DIFFICILE QUICK SCREEN W PCR REFLEX
C Diff antigen: NEGATIVE
C Diff interpretation: NOT DETECTED
C Diff toxin: NEGATIVE

## 2019-06-10 MED ORDER — UNJURY CHICKEN SOUP POWDER
2.0000 [oz_av] | Freq: Four times a day (QID) | ORAL | Status: DC
  Filled 2019-06-10 (×21): qty 27

## 2019-06-10 NOTE — Progress Notes (Signed)
PROGRESS NOTE    [name]  JSE:[id number] DOB: 06/16/1951 DOA: 05/20/2019 PCP: Jalene Mullet, PA-C   Brief Narrative:  68 year old female with recent diagnosis of ulcerative colitis presented with C. difficile positive diarrhea, abdominal pain and diffuse pancolitis, followed closely by GI service.  9/9: Patient continues to have frequent watery bowel movements today. Potassium levels are high-normal and will stop supplementation and follow labs in a.m.  9/10: Patient continues to have frequent watery bowel movements at least greater than 10 in a 24-hour period.  Potassium is once again downtrending.  She is tolerating some of her diet.  GI plans to get CMV stain and is considering colonoscopy in a.m.  9/11: Patient continues to have frequent, watery bowel movements with CMV stain pending.  GI considering colonoscopy based on these results.  Assessment & Plan:   Active Problems:   Colitis   Enteritis due to Clostridium difficile   Tachycardia   IBD (inflammatory bowel disease)   C. difficile pancolitis in the setting of ulcerative colitis with associated flare-persistent -Continue oral vancomycin taper per GI -Continue colazal, Entocort, started 06/01/2019 as well as hydrocortisone enemas -Patient would likely require colonoscopy soon, considering for 9/11 -Continue on lactose free full liquid diet with goal of less than 5 BMs daily -Continue Bentyl twice daily -CMV staining ordered by GI with results still pending  Dysuria-resolved -UA without signs of infection  Transient tachyarrhythmia in the setting of chronic atrial fibrillation -Continue to monitor closely as she is currently in sinus rhythm -Continue increased dose of metoprolol 25 twice daily -Continue a apixaban for full anticoagulation  Hypotension-improved -Likely secondary to dehydration that is difficult to treat in the setting of frequent BMs -Continue IV fluid  COPD -Currently stable and use  bronchodilators as needed  Anxiety/depression -Continue home medication  Recurrent hypokalemia-stable -Currently within normal limits -Continue IV fluid with potassium supplementation and recheck labs in a.m.  DVT prophylaxis:Apixaban Code Status:Full Family Communication:Husband at bedside Disposition Plan:Continue IVF hydration and current treatments per GI.  Possible colonoscopy soon   Consultants:  GI  Procedures:  Flex sig 06/01/19  Antimicrobials:  Anti-infectives (From admission, onward)   Start     Dose/Rate Route Frequency Ordered Stop   06/27/19 1000  vancomycin (VANCOCIN) 50 mg/mL oral solution 125 mg     125 mg Oral Every 3 DAYS 06/02/19 1201 07/06/19 0959   06/19/19 1000  vancomycin (VANCOCIN) 50 mg/mL oral solution 125 mg     125 mg Oral Every other day 06/02/19 1201 06/27/19 0959   06/12/19 1000  vancomycin (VANCOCIN) 50 mg/mL oral solution 125 mg     125 mg Oral Daily 06/02/19 1201 06/19/19 0959   06/04/19 2200  vancomycin (VANCOCIN) 50 mg/mL oral solution 125 mg     125 mg Oral 2 times daily 06/02/19 1201 06/11/19 2159   06/02/19 1400  vancomycin (VANCOCIN) 50 mg/mL oral solution 125 mg     125 mg Oral 4 times daily 06/02/19 1201 06/04/19 0934   06/02/19 1000  fluconazole (DIFLUCAN) tablet 150 mg     150 mg Oral Daily 06/02/19 0854 06/03/19 0945   05/28/19 1800  vancomycin (VANCOCIN) 50 mg/mL oral solution 500 mg  Status:  Discontinued     500 mg Oral 4 times daily 05/28/19 1605 06/02/19 1201   05/22/19 1400  vancomycin (VANCOCIN) 50 mg/mL oral solution 125 mg  Status:  Discontinued     125 mg Oral 4 times daily 05/22/19 1233 05/28/19 1605  05/21/19 0800  ciprofloxacin (CIPRO) IVPB 400 mg  Status:  Discontinued     400 mg 200 mL/hr over 60 Minutes Intravenous Every 12 hours 05/21/19 0213 05/22/19 1233   05/21/19 0400  metroNIDAZOLE (FLAGYL) IVPB 500 mg  Status:  Discontinued     500 mg 100 mL/hr over 60 Minutes Intravenous Every 8 hours  05/21/19 0213 05/22/19 1233   05/20/19 2000  ciprofloxacin (CIPRO) tablet 500 mg  Status:  Discontinued     500 mg Oral  Once 05/20/19 1951 05/20/19 1952   05/20/19 2000  metroNIDAZOLE (FLAGYL) tablet 500 mg  Status:  Discontinued     500 mg Oral  Once 05/20/19 1951 05/20/19 1952   05/20/19 2000  ciprofloxacin (CIPRO) IVPB 400 mg     400 mg 200 mL/hr over 60 Minutes Intravenous  Once 05/20/19 1952 05/20/19 2147   05/20/19 2000  metroNIDAZOLE (FLAGYL) IVPB 500 mg     500 mg 100 mL/hr over 60 Minutes Intravenous  Once 05/20/19 1952 05/20/19 2148       Subjective: Patient seen and evaluated today with no new acute complaints or concerns. No acute concerns or events noted overnight.  She continues to have ongoing, frequent and watery bowel movements.  Objective: Vitals:   06/09/19 0851 06/09/19 2055 06/09/19 2056 06/10/19 0333  BP: 123/78 111/66    Pulse: 71 66 98   Resp: 14 17 16    Temp: 98.3 F (36.8 C) 99.2 F (37.3 C)    TempSrc: Oral Oral    SpO2: 99% 94% 96%   Weight:    89.4 kg  Height:        Intake/Output Summary (Last 24 hours) at 06/10/2019 1012 Last data filed at 06/10/2019 0900 Gross per 24 hour  Intake 876.75 ml  Output -  Net 876.75 ml   Filed Weights   06/08/19 0500 06/09/19 0435 06/10/19 0333  Weight: 84.9 kg 90.2 kg 89.4 kg    Examination:  General exam: Appears calm and comfortable  Respiratory system: Clear to auscultation. Respiratory effort normal. Cardiovascular system: S1 & S2 heard, RRR. No JVD, murmurs, rubs, gallops or clicks. No pedal edema. Gastrointestinal system: Abdomen is nondistended, soft and nontender. No organomegaly or masses felt. Normal bowel sounds heard. Central nervous system: Alert and oriented. No focal neurological deficits. Extremities: Symmetric 5 x 5 power. Skin: No rashes, lesions or ulcers Psychiatry: Judgement and insight appear normal. Mood & affect appropriate.     Data Reviewed: I have personally reviewed  following labs and imaging studies  CBC: Recent Labs  Lab 06/08/19 1107  HGB 11.6*  HCT 16.9   Basic Metabolic Panel: Recent Labs  Lab 06/05/19 0609 06/08/19 0456 06/09/19 0515 06/10/19 0424  NA 138 137 138 138  K 3.5 5.1 3.9 4.3  CL 103 107 105 106  CO2 27 27 27 25   GLUCOSE 92 112* 106* 103*  BUN 5* 5* <5* <5*  CREATININE 0.81 0.75 0.78 0.83  CALCIUM 8.6* 8.4* 8.5* 8.3*  MG  --  2.0 2.1  --    GFR: Estimated Creatinine Clearance: 69.3 mL/min (by C-G formula based on SCr of 0.83 mg/dL). Liver Function Tests: No results for input(s): AST, ALT, ALKPHOS, BILITOT, PROT, ALBUMIN in the last 168 hours. No results for input(s): LIPASE, AMYLASE in the last 168 hours. No results for input(s): AMMONIA in the last 168 hours. Coagulation Profile: No results for input(s): INR, PROTIME in the last 168 hours. Cardiac Enzymes: No results for input(s): CKTOTAL, CKMB,  CKMBINDEX, TROPONINI in the last 168 hours. BNP (last 3 results) No results for input(s): PROBNP in the last 8760 hours. HbA1C: No results for input(s): HGBA1C in the last 72 hours. CBG: No results for input(s): GLUCAP in the last 168 hours. Lipid Profile: No results for input(s): CHOL, HDL, LDLCALC, TRIG, CHOLHDL, LDLDIRECT in the last 72 hours. Thyroid Function Tests: No results for input(s): TSH, T4TOTAL, FREET4, T3FREE, THYROIDAB in the last 72 hours. Anemia Panel: No results for input(s): VITAMINB12, FOLATE, FERRITIN, TIBC, IRON, RETICCTPCT in the last 72 hours. Sepsis Labs: No results for input(s): PROCALCITON, LATICACIDVEN in the last 168 hours.  No results found for this or any previous visit (from the past 240 hour(s)).       Radiology Studies: No results found.      Scheduled Meds: . apixaban  5 mg Oral BID  . balsalazide  2,250 mg Oral TID  . budesonide  9 mg Oral Daily  . buPROPion  150 mg Oral Daily  . busPIRone  7.5 mg Oral BID  . dicyclomine  10 mg Oral BID AC  . DULoxetine  120 mg  Oral Daily  . famotidine  20 mg Oral BID AC  . hydrocortisone  100 mg Rectal QHS  . ketotifen  1 drop Both Eyes Daily  . lidocaine  1 patch Transdermal Q24H  . loratadine  10 mg Oral Daily  . metoprolol tartrate  25 mg Oral BID  . mometasone-formoterol  2 puff Inhalation BID  . pravastatin  20 mg Oral Daily  . pregabalin  100 mg Oral BID  . traZODone  100 mg Oral QHS  . vancomycin  125 mg Oral BID   Followed by  . [START ON 06/12/2019] vancomycin  125 mg Oral Daily   Followed by  . [START ON 06/19/2019] vancomycin  125 mg Oral QODAY   Followed by  . [START ON 06/27/2019] vancomycin  125 mg Oral Q3 days   Continuous Infusions: . sodium chloride 0.45 % with kcl 75 mL/hr at 06/09/19 2139     LOS: 20 days    Time spent: 30 minutes    Kentarius Partington Darleen Crocker, DO [facility] Pager [phone]  If 7PM-7AM, please contact night-coverage [email or web address] Password [email or web address] 06/10/2019, 10:12 AM

## 2019-06-10 NOTE — Progress Notes (Signed)
Subjective: Having loose stool about every hour. Still with some hematochezia. Tolerating her diet. Denies abdominal pain, N/V. No other GI complaints at this time.  Objective: Vital signs in last 24 hours: Temp:  [98.3 F (36.8 C)-99.2 F (37.3 C)] 99.2 F (37.3 C) (09/10 2055) Pulse Rate:  [66-98] 98 (09/10 2056) Resp:  [14-17] 16 (09/10 2056) BP: (111-123)/(66-78) 111/66 (09/10 2055) SpO2:  [94 %-99 %] 96 % (09/10 2056) Weight:  [89.4 kg] 89.4 kg (09/11 0333) Last BM Date: 06/09/19 General:   Alert and oriented, pleasant Head:  Normocephalic and atraumatic. Eyes:  No icterus, sclera clear. Conjuctiva pink.  Heart:  S1, S2 present, no murmurs noted.  Lungs: Clear to auscultation bilaterally, without wheezing, rales, or rhonchi.  Abdomen:  Bowel sounds present, soft, non-tender, non-distended. No HSM or hernias noted. No rebound or guarding. No masses appreciated  Msk:  Symmetrical without gross deformities. Pulses:  Normal bilateral DP pulses noted. Extremities:  Without clubbing or edema. Neurologic:  Alert and  oriented x4;  grossly normal neurologically. Skin:  Warm and dry, intact without significant lesions.  Psych:  Alert and cooperative. Normal mood and affect.  Intake/Output from previous day: 09/10 0701 - 09/11 0700 In: 396.8 [I.V.:396.8] Out: -  Intake/Output this shift: No intake/output data recorded.  Lab Results: Recent Labs    06/08/19 1107  HGB 11.6*  HCT 37.2   BMET Recent Labs    06/08/19 0456 06/09/19 0515 06/10/19 0424  NA 137 138 138  K 5.1 3.9 4.3  CL 107 105 106  CO2 27 27 25   GLUCOSE 112* 106* 103*  BUN 5* <5* <5*  CREATININE 0.75 0.78 0.83  CALCIUM 8.4* 8.5* 8.3*   LFT No results for input(s): PROT, ALBUMIN, AST, ALT, ALKPHOS, BILITOT, BILIDIR, IBILI in the last 72 hours. PT/INR No results for input(s): LABPROT, INR in the last 72 hours. Hepatitis Panel No results for input(s): HEPBSAG, HCVAB, HEPAIGM, HEPBIGM in the last  72 hours.   Studies/Results: No results found.  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 difficile infection back on August 21.  Initially on vancomycin 125 mg 4 times daily orally, increased to 500 mg orally 4 times a day on August 29. Colazal increased to maximum dose on August 27 for concerns of exacerbation of underlying UC.  Patient had flexible sigmoidoscopy on 9/2 for persistent watery stools with blood per rectum.  She was noted to have severe proctocolitis most consistent endoscopically with UC.  Suspected exacerbation of underlying IBD with recent C. difficile infection.  Started on Entocort 9 mg daily on September 2.  Vancomycin decreased to 125 mg 4 times daily on September 3.  Hydrocortisone enema started September 4.  Questran was attempted but caused worsening diarrhea.  Slow vancomycin taper started September 5.  Currently receiving vancomycin 125 mg twice daily, Bentyl twice daily added on September 8.  Continues to have frequent loose bloody stools.  No significant change in her symptoms. Unclear if UC flare, refractory Cdiff other other etiology ie underlying CMV infection. Dr. Gala Romney requested special stains for CMV given patient's prolonged illness.   Today she has stable but unimproved symptoms. Still with multiple loose stools (about hourly). No other significant change. 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 Awaiting results of CMV stain on previously obtained biopsies. Possible need for repeat endoscopic evaluation pending CMV results.   Plan: 1. Follow for CMV stain results (pending  likely this afternoon) 2. Continue current medication regimin 3. Further recommendations pending CMV results 4. Further recommendations to follow (?possible repeat colonoscopy) 5. Supportive care   Thank you for allowing [place] to participate in the care of [place],  DNP, AGNP-C Adult & Gerontological Nurse Practitioner [facility]     LOS: 20 days    06/10/2019, 8:04 AM

## 2019-06-10 NOTE — Progress Notes (Signed)
Nutrition Follow-up  DOCUMENTATION CODES:   Obesity unspecified  INTERVENTION:  -Monitor for CMV stain results; GI recommendations -Continue Boost Breeze po TID, each supplement provides 250 kcal and 9 grams of protein  -Unjury po daily; each packet provides 100 kcal and 21 grams protein -MVI with minerals  NUTRITION DIAGNOSIS:   Moderate Malnutrition related to acute illness(altered GI function; ongoing multiple loose stools) as evidenced by energy intake < 75% for > 7 days, percent weight loss   New nutrition diagnosis  GOAL:   Patient will meet greater than or equal to 90% of their needs  Progressing  MONITOR:   Diet advancement, PO intake, Labs, Skin, Weight trends  REASON FOR ASSESSMENT:   LOS    ASSESSMENT:   Patient is a 68 yo female with persisting watery stools, + c.diff pancolitis. History of UC, COPD, GERD, Pancreatitis, anemia, DM-2, HOH.  9/10 - 16 stools x 24 hrs; some large; some fresh blood; improved abdominal pain. Unclear if UC flare/refractory Cdiff/other etiology - CMV stain per Dr. Roseanne Kaufman request  9/10 -Diet advanced to [facility]; 50% intake 9/6-9/8 - 75-100% CL/FL  9/11 - GI reports patient continues to have loose stool about every hour with some hematochezia; tolerating diet; denies abdominal pain, N/V. Awaiting results of CMV stain; possible need for repeat colonoscopy pending CMV results.   Patient meets criteria for malnutrition considering severe wt loss over the past 8 days as well as inadequate oral intake secondary to ongoing multiple loose BMs daily. Nutrition Focused Physical Exam unable to be completed at this time due to contact/enteric precautions requiring PPE to enter room.   Current wt 89.4 kg (197.1 lb) non-pitting BLE Patient noted with 16.8 lb wt loss since admission (7.9%; severe for time frame) 06/10/19 0333  89.4 kg  197.09 lbs   06/09/19 0435  90.2 kg  198.86 lbs   06/08/19 0500  84.9 kg  187.17 lbs   06/07/19 0500  89 kg   196.21 lbs   06/06/19 0500  87.1 kg  192.02 lbs   06/05/19 05:18:22  93.4 kg  205.91 lbs   06/04/19 0431  93.9 kg  207.01 lbs   06/03/19 0455  96.5 kg  212.74 lbs   06/02/19 05:23:20  97 kg  213.85 lbs     Diet Order:   Diet Order            Diet Heart Room service appropriate? Yes; Fluid consistency: Thin  Diet effective now              EDUCATION NEEDS:   No education needs have been identified at this time  Skin:  Skin Assessment: Reviewed RN Assessment  Last BM:  9/11 (med; brown; red; type 7; pt on enteric precautions)  Height:   Ht Readings from Last 1 Encounters:  05/20/19 5' 4"  (1.626 m)    Weight:   Wt Readings from Last 1 Encounters:  06/10/19 89.4 kg    Ideal Body Weight:  54.5 kg  BMI:  Body mass index is 33.83 kg/m.  Estimated Nutritional Needs:   Kcal:  0131-4388 (MSJ 1.2-1.3)  Protein:  82-98 (1.5-1.8 g/kg/IBW)  Fluid:  1.6-1.8 L/day   Lajuan Lines, RD, LDN Office [phone] After Hours/Weekend Pager: [phone]

## 2019-06-10 NOTE — Care Management Important Message (Signed)
Important Message  Patient Details  Name: [name] MRN: [id number] Date of Birth: 1951-08-06   Medicare Important Message Given:  Yes(given to nurse to deliver to patient due to contact precautions)     Tommy Medal 06/10/2019, 1:02 PM

## 2019-06-11 DIAGNOSIS — K529 Noninfective gastroenteritis and colitis, unspecified: Secondary | ICD-10-CM

## 2019-06-11 DIAGNOSIS — A0472 Enterocolitis due to Clostridium difficile, not specified as recurrent: Secondary | ICD-10-CM

## 2019-06-11 DIAGNOSIS — R000 Tachycardia, unspecified: Secondary | ICD-10-CM

## 2019-06-11 LAB — BASIC METABOLIC PANEL
Anion gap: 6 (ref 5–15)
BUN: 5 mg/dL — ABNORMAL LOW (ref 8–23)
CO2: 25 mmol/L (ref 22–32)
Calcium: 8.3 mg/dL — ABNORMAL LOW (ref 8.9–10.3)
Chloride: 107 mmol/L (ref 98–111)
Creatinine, Ser: 0.92 mg/dL (ref 0.44–1.00)
GFR calc Af Amer: >60 mL/min (ref >60)
GFR calc non Af Amer: >60 mL/min (ref >60)
Glucose, Bld: 105 mg/dL — ABNORMAL HIGH (ref 70–99)
Potassium: 3.8 mmol/L (ref 3.5–5.1)
Sodium: 138 mmol/L (ref 135–145)

## 2019-06-11 LAB — GLUCOSE, CAPILLARY
Glucose-Capillary: 147 mg/dL — ABNORMAL HIGH (ref 70–99)
Glucose-Capillary: 241 mg/dL — ABNORMAL HIGH (ref 70–99)
Glucose-Capillary: 193 mg/dL — ABNORMAL HIGH (ref 70–99)

## 2019-06-11 MED ORDER — INSULIN ASPART 100 UNIT/ML ~~LOC~~ SOLN
0.0000 [IU] | Freq: Every day | SUBCUTANEOUS | Status: DC
  Administered 2019-06-14: 22:00:00 4 [IU] via SUBCUTANEOUS

## 2019-06-11 MED ORDER — METHYLPREDNISOLONE SODIUM SUCC 40 MG IJ SOLR
40.0000 mg | Freq: Two times a day (BID) | INTRAMUSCULAR | Status: DC
  Administered 2019-06-11 – 2019-06-14 (×7): 40 mg via INTRAVENOUS
  Filled 2019-06-11 (×7): qty 1

## 2019-06-11 MED ORDER — INSULIN ASPART 100 UNIT/ML ~~LOC~~ SOLN
0.0000 [IU] | Freq: Three times a day (TID) | SUBCUTANEOUS | Status: DC
  Administered 2019-06-11: 1 [IU] via SUBCUTANEOUS
  Administered 2019-06-11: 17:00:00 3 [IU] via SUBCUTANEOUS
  Administered 2019-06-12 – 2019-06-14 (×7): 2 [IU] via SUBCUTANEOUS
  Administered 2019-06-14: 3 [IU] via SUBCUTANEOUS
  Administered 2019-06-14: 2 [IU] via SUBCUTANEOUS
  Administered 2019-06-15: 1 [IU] via SUBCUTANEOUS
  Administered 2019-06-15: 2 [IU] via SUBCUTANEOUS

## 2019-06-11 NOTE — Plan of Care (Signed)

## 2019-06-11 NOTE — Progress Notes (Signed)
Subjective:  Patient remains with diarrhea.  She says she had multiple bowel movements yesterday and she is already had 405 since 6 AM.  She continues to complain of abdominal pain primarily at LLQ.  Her appetite is fair.  She denies nausea or vomiting.  Objective: Blood pressure 100/68, pulse (!) 104, temperature 97.8 F (36.6 C), resp. rate 16, height _0  (1.626 m), weight 89.4 kg, SpO2 95 %. Patient is alert and in no acute distress. Diminished full with normal bowel sounds.  On palpation she has mild tenderness in both iliac fossae.  No guarding.  No organomegaly or masses.   Labs/studies Results:  CBC Latest Ref Rng & Units 06/08/2019 06/02/2019 06/01/2019  WBC 4.0 - 10.5 K/uL - 8.6 8.1  Hemoglobin 12.0 - 15.0 g/dL 11.6(L) 10.3(L) 10.8(L)  Hematocrit 36.0 - 46.0 % 37.2 33.2(L) 34.2(L)  Platelets 150 - 400 K/uL - 295 264    CMP Latest Ref Rng & Units 06/11/2019 06/10/2019 06/09/2019  Glucose 70 - 99 mg/dL 105(H) 103(H) 106(H)  BUN 8 - 23 mg/dL 5(L) <5(L) <5(L)  Creatinine 0.44 - 1.00 mg/dL 0.92 0.83 0.78  Sodium 135 - 145 mmol/L 138 138 138  Potassium 3.5 - 5.1 mmol/L 3.8 4.3 3.9  Chloride 98 - 111 mmol/L 107 106 105  CO2 22 - 32 mmol/L _1 Calcium 8.9 - 10.3 mg/dL 8.3(L) 8.3(L) 8.5(L)  Total Protein 6.5 - 8.1 g/dL - - -  Total Bilirubin 0.3 - 1.2 mg/dL - - -  Alkaline Phos 38 - 126 U/L - - -  AST 15 - 41 U/L - - -  ALT 0 - 44 U/L - - -    Hepatic Function Latest Ref Rng & Units 05/21/2019 05/20/2019 03/10/2019  Total Protein 6.5 - 8.1 g/dL 6.6 7.2 6.7  Albumin 3.5 - 5.0 g/dL 3.2(L) 3.5 -  AST 15 - 41 U/L 14(L) 13(L) 10  ALT 0 - 44 U/L _2 Alk Phosphatase 38 - 126 U/L 79 88 -  Total Bilirubin 0.3 - 1.2 mg/dL 0.6 0.7 0.4    Lab Results  Component Value Date   CRP 6.6 03/10/2019      Assessment:  #1.  Ulcerative colitis.  Patient presented with C. difficile colitis but her diarrhea has continued despite therapy.  She had flexible sigmoidoscopy by Dr. Gala Romney 10  days ago revealing endoscopic changes of C. difficile colitis.  She is not responding to budesonide and hydrocortisone enemas.  She remains on balsalazide which she has taken for years.  She had colonoscopy in February 2018 and she was in endoscopic remission.  Therefore this episode may have been triggered by C. difficile or her illness has been UC flare all along with positive C. difficile test.  Since patient is not improving with therapy it would be reasonable to treat her with systemic steroids.  Main concern at this time would be hyperglycemia given that she is diabetic.  While in the hospital she she will be monitored closely.  #2.  C. difficile colitis.  C. difficile antigen and toxin were positive on 05/22/2019.  Now test negative for antigen and toxin.  Therefore C. difficile colitis is not an issue at this time but she is at increased risk for relapse and therefore should be continued on vancomycin for another 1 week.  #3.  Diabetes mellitus.  #4.  Mild anemia secondary to GI blood loss and acute illness   Recommendations  Discontinue hydrocortisone enemas and budesonide.  Continue balsalazide. Begin methylprednisolone 40 mg IV every 12 hours. Accu-Chek every 6 hours with sliding scale coverage.

## 2019-06-11 NOTE — Progress Notes (Signed)
PROGRESS NOTE    [name]  QPR:[id number] DOB: 1951-04-30 DOA: 05/20/2019 PCP: Jalene Mullet, PA-C   Brief Narrative:  68 year old female with recent diagnosis of ulcerative colitis presented with C. difficile positive diarrhea, abdominal pain and diffuse pancolitis, followed closely by GI service.  9/9: Patient continues to have frequent watery bowel movements today. Potassium levels are high-normal and will stop supplementation and follow labs in a.m.  9/10:Patient continues to have frequent watery bowel movements at least greater than 10 in a 24-hour period. Potassium is once again downtrending. She is tolerating some of her diet. GI plans to get CMV stain and is considering colonoscopy in a.m.  9/11: Patient continues to have frequent, watery bowel movements with CMV stain pending.  GI considering colonoscopy based on these results.  9/12: Patient continues to have crampy abdominal pain as well as multiple bowel movements.  Repeat C. difficile testing negative.  GI has started patient on IV Solu-Medrol.  Assessment & Plan:   Active Problems:   Colitis   Enteritis due to Clostridium difficile   Tachycardia   IBD (inflammatory bowel disease)   Hematochezia   C. difficile pancolitis in the setting of ulcerative colitis with associated flare-persistent -Continue oral vancomycin taper per GI for at least another week due to increased risk of relapse -Continue balsalazide and begin methylprednisolone 40 mg IV twice daily -Continue on lactosefree full liquid diet with goal of less than 5 BMs daily -Continue Bentyl twice daily -CMV staining ordered by GI with results still pending  Dysuria-resolved -UA without signs of infection  Transient tachyarrhythmia in the setting of chronic atrial fibrillation -Continue to monitor closely as she is currently in sinus rhythm -Continue increased dose of metoprolol 25 twice daily -Continue a apixaban for full anticoagulation   Hypotension-improved -Likely secondary to dehydration that is difficult to treat in the setting of frequent BMs -Continue IV fluid  COPD -Currently stable and use bronchodilators as needed  Anxiety/depression -Continue home medication  Recurrent hypokalemia-stable -Currently within normal limits -Continue IV fluid with potassium supplementation and recheck labs in a.m. -Increased IV rate slightly today  DVT prophylaxis:Apixaban Code Status:Full Family Communication:Husband at bedside Disposition Plan:Continue IVF hydration and current treatments per GI.Possible colonoscopy soon   Consultants:  GI  Procedures:  Flex sig 06/01/19  Antimicrobials:  Anti-infectives (From admission, onward)   Start     Dose/Rate Route Frequency Ordered Stop   06/27/19 1000  vancomycin (VANCOCIN) 50 mg/mL oral solution 125 mg     125 mg Oral Every 3 DAYS 06/02/19 1201 07/06/19 0959   06/19/19 1000  vancomycin (VANCOCIN) 50 mg/mL oral solution 125 mg     125 mg Oral Every other day 06/02/19 1201 06/27/19 0959   06/12/19 1000  vancomycin (VANCOCIN) 50 mg/mL oral solution 125 mg     125 mg Oral Daily 06/02/19 1201 06/19/19 0959   06/04/19 2200  vancomycin (VANCOCIN) 50 mg/mL oral solution 125 mg     125 mg Oral 2 times daily 06/02/19 1201 06/11/19 1109   06/02/19 1400  vancomycin (VANCOCIN) 50 mg/mL oral solution 125 mg     125 mg Oral 4 times daily 06/02/19 1201 06/04/19 0934   06/02/19 1000  fluconazole (DIFLUCAN) tablet 150 mg     150 mg Oral Daily 06/02/19 0854 06/03/19 0945   05/28/19 1800  vancomycin (VANCOCIN) 50 mg/mL oral solution 500 mg  Status:  Discontinued     500 mg Oral 4 times daily 05/28/19 1605 06/02/19 1201  05/22/19 1400  vancomycin (VANCOCIN) 50 mg/mL oral solution 125 mg  Status:  Discontinued     125 mg Oral 4 times daily 05/22/19 1233 05/28/19 1605   05/21/19 0800  ciprofloxacin (CIPRO) IVPB 400 mg  Status:  Discontinued     400 mg 200 mL/hr over 60  Minutes Intravenous Every 12 hours 05/21/19 0213 05/22/19 1233   05/21/19 0400  metroNIDAZOLE (FLAGYL) IVPB 500 mg  Status:  Discontinued     500 mg 100 mL/hr over 60 Minutes Intravenous Every 8 hours 05/21/19 0213 05/22/19 1233   05/20/19 2000  ciprofloxacin (CIPRO) tablet 500 mg  Status:  Discontinued     500 mg Oral  Once 05/20/19 1951 05/20/19 1952   05/20/19 2000  metroNIDAZOLE (FLAGYL) tablet 500 mg  Status:  Discontinued     500 mg Oral  Once 05/20/19 1951 05/20/19 1952   05/20/19 2000  ciprofloxacin (CIPRO) IVPB 400 mg     400 mg 200 mL/hr over 60 Minutes Intravenous  Once 05/20/19 1952 05/20/19 2147   05/20/19 2000  metroNIDAZOLE (FLAGYL) IVPB 500 mg     500 mg 100 mL/hr over 60 Minutes Intravenous  Once 05/20/19 1952 05/20/19 2148       Subjective: Patient seen and evaluated today with ongoing, frequent watery bowel movements with crampy abdominal pain.  Repeat C. difficile PCR testing noted to be negative.  Objective: Vitals:   06/10/19 2355 06/11/19 0547 06/11/19 0729 06/11/19 1124  BP: (!) 110/59 122/64  100/68  Pulse: 64 75  (!) 104  Resp:  16  16  Temp: 98.7 F (37.1 C) 97.8 F (36.6 C)    TempSrc:      SpO2: 91% 94% 91% 95%  Weight:      Height:        Intake/Output Summary (Last 24 hours) at 06/11/2019 1227 Last data filed at 06/10/2019 1700 Gross per 24 hour  Intake 380 ml  Output -  Net 380 ml   Filed Weights   06/08/19 0500 06/09/19 0435 06/10/19 0333  Weight: 84.9 kg 90.2 kg 89.4 kg    Examination:  General exam: Appears calm and comfortable  Respiratory system: Clear to auscultation. Respiratory effort normal. Cardiovascular system: S1 & S2 heard, RRR. No JVD, murmurs, rubs, gallops or clicks. No pedal edema. Gastrointestinal system: Abdomen is nondistended, soft and nontender. No organomegaly or masses felt. Normal bowel sounds heard. Central nervous system: Alert and oriented. No focal neurological deficits. Extremities: Symmetric 5 x 5  power. Skin: No rashes, lesions or ulcers Psychiatry: Judgement and insight appear normal. Mood & affect appropriate.     Data Reviewed: I have personally reviewed following labs and imaging studies  CBC: Recent Labs  Lab 06/08/19 1107  HGB 11.6*  HCT 77.9   Basic Metabolic Panel: Recent Labs  Lab 06/05/19 0609 06/08/19 0456 06/09/19 0515 06/10/19 0424 06/11/19 0627  NA 138 137 138 138 138  K 3.5 5.1 3.9 4.3 3.8  CL 103 107 105 106 107  CO2 27 27 27 25 25   GLUCOSE 92 112* 106* 103* 105*  BUN 5* 5* <5* <5* 5*  CREATININE 0.81 0.75 0.78 0.83 0.92  CALCIUM 8.6* 8.4* 8.5* 8.3* 8.3*  MG  --  2.0 2.1  --   --    GFR: Estimated Creatinine Clearance: 62.5 mL/min (by C-G formula based on SCr of 0.92 mg/dL). Liver Function Tests: No results for input(s): AST, ALT, ALKPHOS, BILITOT, PROT, ALBUMIN in the last 168 hours. No results  for input(s): LIPASE, AMYLASE in the last 168 hours. No results for input(s): AMMONIA in the last 168 hours. Coagulation Profile: No results for input(s): INR, PROTIME in the last 168 hours. Cardiac Enzymes: No results for input(s): CKTOTAL, CKMB, CKMBINDEX, TROPONINI in the last 168 hours. BNP (last 3 results) No results for input(s): PROBNP in the last 8760 hours. HbA1C: No results for input(s): HGBA1C in the last 72 hours. CBG: Recent Labs  Lab 06/11/19 1220  GLUCAP 147*   Lipid Profile: No results for input(s): CHOL, HDL, LDLCALC, TRIG, CHOLHDL, LDLDIRECT in the last 72 hours. Thyroid Function Tests: No results for input(s): TSH, T4TOTAL, FREET4, T3FREE, THYROIDAB in the last 72 hours. Anemia Panel: No results for input(s): VITAMINB12, FOLATE, FERRITIN, TIBC, IRON, RETICCTPCT in the last 72 hours. Sepsis Labs: No results for input(s): PROCALCITON, LATICACIDVEN in the last 168 hours.  Recent Results (from the past 240 hour(s))  C difficile quick scan w PCR reflex     Status: None   Collection Time: 06/10/19  6:25 PM   Specimen: STOOL   Result Value Ref Range Status   C Diff antigen NEGATIVE NEGATIVE Final   C Diff toxin NEGATIVE NEGATIVE Final   C Diff interpretation No C. difficile detected.  Final    Comment: Performed at [facility], [address], [place], [place] [address]         Radiology Studies: No results found.      Scheduled Meds: . apixaban  5 mg Oral BID  . balsalazide  2,250 mg Oral TID  . buPROPion  150 mg Oral Daily  . busPIRone  7.5 mg Oral BID  . dicyclomine  10 mg Oral BID AC  . DULoxetine  120 mg Oral Daily  . famotidine  20 mg Oral BID AC  . insulin aspart  0-5 Units Subcutaneous QHS  . insulin aspart  0-9 Units Subcutaneous TID WC  . ketotifen  1 drop Both Eyes Daily  . lidocaine  1 patch Transdermal Q24H  . loratadine  10 mg Oral Daily  . methylPREDNISolone (SOLU-MEDROL) injection  40 mg Intravenous Q12H  . metoprolol tartrate  25 mg Oral BID  . mometasone-formoterol  2 puff Inhalation BID  . pravastatin  20 mg Oral Daily  . pregabalin  100 mg Oral BID  . protein supplement  2 oz Oral QID  . traZODone  100 mg Oral QHS  . [START ON 06/12/2019] vancomycin  125 mg Oral Daily   Followed by  . [START ON 06/19/2019] vancomycin  125 mg Oral QODAY   Followed by  . [START ON 06/27/2019] vancomycin  125 mg Oral Q3 days   Continuous Infusions: . sodium chloride 0.45 % with kcl 50 mL/hr at 06/11/19 1108     LOS: 21 days    Time spent: 30 minutes    Rafael Salway Darleen Crocker, DO [facility] Pager [phone]  If 7PM-7AM, please contact night-coverage [email or web address] Password [facility] 06/11/2019, 12:27 PM

## 2019-06-12 ENCOUNTER — Inpatient Hospital Stay (HOSPITAL_COMMUNITY): Payer: Medicare Other

## 2019-06-12 LAB — GLUCOSE, CAPILLARY
Glucose-Capillary: 164 mg/dL — ABNORMAL HIGH (ref 70–99)
Glucose-Capillary: 192 mg/dL — ABNORMAL HIGH (ref 70–99)
Glucose-Capillary: 177 mg/dL — ABNORMAL HIGH (ref 70–99)
Glucose-Capillary: 161 mg/dL — ABNORMAL HIGH (ref 70–99)

## 2019-06-12 LAB — BASIC METABOLIC PANEL
Anion gap: 6 (ref 5–15)
BUN: 5 mg/dL — ABNORMAL LOW (ref 8–23)
CO2: 25 mmol/L (ref 22–32)
Calcium: 8.5 mg/dL — ABNORMAL LOW (ref 8.9–10.3)
Chloride: 106 mmol/L (ref 98–111)
Creatinine, Ser: 0.78 mg/dL (ref 0.44–1.00)
GFR calc Af Amer: >60 mL/min (ref >60)
GFR calc non Af Amer: >60 mL/min (ref >60)
Glucose, Bld: 191 mg/dL — ABNORMAL HIGH (ref 70–99)
Potassium: 4.6 mmol/L (ref 3.5–5.1)
Sodium: 137 mmol/L (ref 135–145)

## 2019-06-12 LAB — CBC
HCT: 35.9 % — ABNORMAL LOW (ref 36.0–46.0)
Hemoglobin: 11.2 g/dL — ABNORMAL LOW (ref 12.0–15.0)
MCH: 29.7 pg (ref 26.0–34.0)
MCHC: 31.2 g/dL (ref 30.0–36.0)
MCV: 95.2 fL (ref 80.0–100.0)
Platelets: 344 10*3/uL (ref 150–400)
RBC: 3.77 MIL/uL — ABNORMAL LOW (ref 3.87–5.11)
RDW: 16 % — ABNORMAL HIGH (ref 11.5–15.5)
WBC: 8.7 10*3/uL (ref 4.0–10.5)
nRBC: 0 % (ref 0.0–0.2)

## 2019-06-12 LAB — MAGNESIUM: Magnesium: 1.8 mg/dL (ref 1.7–2.4)

## 2019-06-12 MED ORDER — LACTATED RINGERS IV SOLN
INTRAVENOUS | Status: DC
  Administered 2019-06-12 – 2019-06-13 (×2): via INTRAVENOUS

## 2019-06-12 MED ORDER — METOPROLOL TARTRATE 50 MG PO TABS
50.0000 mg | ORAL_TABLET | Freq: Two times a day (BID) | ORAL | Status: DC
  Administered 2019-06-12 – 2019-06-15 (×6): 50 mg via ORAL
  Filled 2019-06-12 (×7): qty 1

## 2019-06-12 MED ORDER — DM-GUAIFENESIN ER 30-600 MG PO TB12
1.0000 | ORAL_TABLET | Freq: Two times a day (BID) | ORAL | Status: DC
  Administered 2019-06-12 – 2019-06-15 (×7): 1 via ORAL
  Filled 2019-06-12 (×7): qty 1

## 2019-06-12 NOTE — Progress Notes (Signed)
PROGRESS NOTE    [name]  YIR:[id number] DOB: January 24, 1951 DOA: 05/20/2019 PCP: Jalene Mullet, PA-C   Brief Narrative:  68 year old female with recent diagnosis of ulcerative colitis presented with C. difficile positive diarrhea, abdominal pain and diffuse pancolitis, followed closely by GI service.  9/9: Patient continues to have frequent watery bowel movements today. Potassium levels are high-normal and will stop supplementation and follow labs in a.m.  9/10:Patient continues to have frequent watery bowel movements at least greater than 10 in a 24-hour period. Potassium is once again downtrending. She is tolerating some of her diet. GI plans to get CMV stain and is considering colonoscopy in a.m.  9/11:Patient continues to have frequent, watery bowel movements with CMV stain pending. GI considering colonoscopy based on these results.  9/12: Patient continues to have crampy abdominal pain as well as multiple bowel movements.  Repeat C. difficile testing negative.  GI has started patient on IV Solu-Medrol.  9/13: Patient continues to have frequent, watery bowel movements.  She complains of a mild cough this morning and chest x-ray obtained with no acute findings noted.  Continues to remain on IV Solu-Medrol with GI following.   Assessment & Plan:   Active Problems:   Colitis   Enteritis due to Clostridium difficile   Tachycardia   IBD (inflammatory bowel disease)   Hematochezia   C. difficile pancolitis in the setting of ulcerative colitis with associated flare-persistent -Continue oral vancomycin taper per GI for at least another week due to increased risk of relapse -Continue balsalazide and begin methylprednisolone 40 mg IV twice daily on 9/12 -Continue on lactosefree full liquid diet with goal of less than 5 BMs daily -Continue Bentyl twice daily -CMV staining ordered by GI with resultsstillpending  Cough/congestion -Chest x-ray ordered with no acute  findings -Mucinex ordered for now  Dysuria-resolved -UA without signs of infection  Transient tachyarrhythmia in the setting of chronic atrial fibrillation -Continue to monitor closely as she is currently in sinus rhythm -Continue increased dose of metoprolol 25 twice daily -Continue a apixaban for full anticoagulation  Hypotension-improved -Likely secondary to dehydration that is difficult to treat in the setting of frequent BMs -Continue IV fluid  COPD -Currently stable and use bronchodilators as needed  Anxiety/depression -Continue home medication  Recurrent hypokalemia-stable -Currently on higher end and will stop supplementation for now -Recheck a.m. labs  DVT prophylaxis:Apixaban Code Status:Full Family Communication:Husband at bedside Disposition Plan:Continue IVF hydration and current treatments per GI.Possible colonoscopysoon   Consultants:  GI  Procedures:  Flex sig 06/01/19  Antimicrobials:  Anti-infectives (From admission, onward)   Start     Dose/Rate Route Frequency Ordered Stop   06/27/19 1000  vancomycin (VANCOCIN) 50 mg/mL oral solution 125 mg     125 mg Oral Every 3 DAYS 06/02/19 1201 07/06/19 0959   06/19/19 1000  vancomycin (VANCOCIN) 50 mg/mL oral solution 125 mg     125 mg Oral Every other day 06/02/19 1201 06/27/19 0959   06/12/19 1000  vancomycin (VANCOCIN) 50 mg/mL oral solution 125 mg     125 mg Oral Daily 06/02/19 1201 06/19/19 0959   06/04/19 2200  vancomycin (VANCOCIN) 50 mg/mL oral solution 125 mg     125 mg Oral 2 times daily 06/02/19 1201 06/11/19 1109   06/02/19 1400  vancomycin (VANCOCIN) 50 mg/mL oral solution 125 mg     125 mg Oral 4 times daily 06/02/19 1201 06/04/19 0934   06/02/19 1000  fluconazole (DIFLUCAN) tablet 150 mg  150 mg Oral Daily 06/02/19 0854 06/03/19 0945   05/28/19 1800  vancomycin (VANCOCIN) 50 mg/mL oral solution 500 mg  Status:  Discontinued     500 mg Oral 4 times daily 05/28/19 1605  06/02/19 1201   05/22/19 1400  vancomycin (VANCOCIN) 50 mg/mL oral solution 125 mg  Status:  Discontinued     125 mg Oral 4 times daily 05/22/19 1233 05/28/19 1605   05/21/19 0800  ciprofloxacin (CIPRO) IVPB 400 mg  Status:  Discontinued     400 mg 200 mL/hr over 60 Minutes Intravenous Every 12 hours 05/21/19 0213 05/22/19 1233   05/21/19 0400  metroNIDAZOLE (FLAGYL) IVPB 500 mg  Status:  Discontinued     500 mg 100 mL/hr over 60 Minutes Intravenous Every 8 hours 05/21/19 0213 05/22/19 1233   05/20/19 2000  ciprofloxacin (CIPRO) tablet 500 mg  Status:  Discontinued     500 mg Oral  Once 05/20/19 1951 05/20/19 1952   05/20/19 2000  metroNIDAZOLE (FLAGYL) tablet 500 mg  Status:  Discontinued     500 mg Oral  Once 05/20/19 1951 05/20/19 1952   05/20/19 2000  ciprofloxacin (CIPRO) IVPB 400 mg     400 mg 200 mL/hr over 60 Minutes Intravenous  Once 05/20/19 1952 05/20/19 2147   05/20/19 2000  metroNIDAZOLE (FLAGYL) IVPB 500 mg     500 mg 100 mL/hr over 60 Minutes Intravenous  Once 05/20/19 1952 05/20/19 2148       Subjective: Patient seen and evaluated today with no new acute complaints or concerns. No acute concerns or events noted overnight.  She complains of a mild nonproductive cough as well as ongoing frequent, watery bowel movements.  She states she feels "washed out."  Objective: Vitals:   06/11/19 2149 06/12/19 0447 06/12/19 0550 06/12/19 0959  BP: 113/74  107/69   Pulse: 63  90   Resp: (!) 21  20   Temp: 98.3 F (36.8 C)  98 F (36.7 C)   TempSrc:   Oral   SpO2: 90%  96% 97%  Weight:  88.1 kg    Height:        Intake/Output Summary (Last 24 hours) at 06/12/2019 1117 Last data filed at 06/12/2019 0913 Gross per 24 hour  Intake 2025 ml  Output -  Net 2025 ml   Filed Weights   06/09/19 0435 06/10/19 0333 06/12/19 0447  Weight: 90.2 kg 89.4 kg 88.1 kg    Examination:  General exam: Appears calm and comfortable  Respiratory system: Clear to auscultation.  Respiratory effort normal. Cardiovascular system: S1 & S2 heard, RRR. No JVD, murmurs, rubs, gallops or clicks. No pedal edema. Gastrointestinal system: Abdomen is nondistended, soft and nontender. No organomegaly or masses felt. Normal bowel sounds heard. Central nervous system: Alert and oriented. No focal neurological deficits. Extremities: Symmetric 5 x 5 power. Skin: No rashes, lesions or ulcers Psychiatry: Flat affect    Data Reviewed: I have personally reviewed following labs and imaging studies  CBC: Recent Labs  Lab 06/08/19 1107 06/12/19 0607  WBC  --  8.7  HGB 11.6* 11.2*  HCT 37.2 35.9*  MCV  --  95.2  PLT  --  882   Basic Metabolic Panel: Recent Labs  Lab 06/08/19 0456 06/09/19 0515 06/10/19 0424 06/11/19 0627 06/12/19 0607  NA 137 138 138 138 137  K 5.1 3.9 4.3 3.8 4.6  CL 107 105 106 107 106  CO2 27 27 25 25 25   GLUCOSE 112* 106* 103* 105*  191*  BUN 5* <5* <5* 5* 5*  CREATININE 0.75 0.78 0.83 0.92 0.78  CALCIUM 8.4* 8.5* 8.3* 8.3* 8.5*  MG 2.0 2.1  --   --  1.8   GFR: Estimated Creatinine Clearance: 71.4 mL/min (by C-G formula based on SCr of 0.78 mg/dL). Liver Function Tests: No results for input(s): AST, ALT, ALKPHOS, BILITOT, PROT, ALBUMIN in the last 168 hours. No results for input(s): LIPASE, AMYLASE in the last 168 hours. No results for input(s): AMMONIA in the last 168 hours. Coagulation Profile: No results for input(s): INR, PROTIME in the last 168 hours. Cardiac Enzymes: No results for input(s): CKTOTAL, CKMB, CKMBINDEX, TROPONINI in the last 168 hours. BNP (last 3 results) No results for input(s): PROBNP in the last 8760 hours. HbA1C: No results for input(s): HGBA1C in the last 72 hours. CBG: Recent Labs  Lab 06/11/19 1220 06/11/19 1609 06/11/19 2147 06/12/19 0739  GLUCAP 147* 241* 193* 164*   Lipid Profile: No results for input(s): CHOL, HDL, LDLCALC, TRIG, CHOLHDL, LDLDIRECT in the last 72 hours. Thyroid Function Tests: No  results for input(s): TSH, T4TOTAL, FREET4, T3FREE, THYROIDAB in the last 72 hours. Anemia Panel: No results for input(s): VITAMINB12, FOLATE, FERRITIN, TIBC, IRON, RETICCTPCT in the last 72 hours. Sepsis Labs: No results for input(s): PROCALCITON, LATICACIDVEN in the last 168 hours.  Recent Results (from the past 240 hour(s))  C difficile quick scan w PCR reflex     Status: None   Collection Time: 06/10/19  6:25 PM   Specimen: STOOL  Result Value Ref Range Status   C Diff antigen NEGATIVE NEGATIVE Final   C Diff toxin NEGATIVE NEGATIVE Final   C Diff interpretation No C. difficile detected.  Final    Comment: Performed at [facility], [address] [place], [place] [address]         Radiology Studies: Dg Chest [facility] 1 View  Result Date: 06/12/2019 CLINICAL DATA:  Productive cough. EXAM: PORTABLE CHEST 1 VIEW COMPARISON:  05/20/2019 FINDINGS: Lordotic technique is demonstrated. Lungs are adequately inflated without consolidation or effusion. Borderline stable cardiomegaly. Large hiatal hernia. Remainder of the exam is unchanged. IMPRESSION: No acute cardiopulmonary disease. Large hiatal hernia. Electronically Signed   By: Marin Olp M.D.   On: 06/12/2019 11:06        Scheduled Meds: . apixaban  5 mg Oral BID  . balsalazide  2,250 mg Oral TID  . buPROPion  150 mg Oral Daily  . busPIRone  7.5 mg Oral BID  . dextromethorphan-guaiFENesin  1 tablet Oral BID  . dicyclomine  10 mg Oral BID AC  . DULoxetine  120 mg Oral Daily  . famotidine  20 mg Oral BID AC  . insulin aspart  0-5 Units Subcutaneous QHS  . insulin aspart  0-9 Units Subcutaneous TID WC  . ketotifen  1 drop Both Eyes Daily  . lidocaine  1 patch Transdermal Q24H  . loratadine  10 mg Oral Daily  . methylPREDNISolone (SOLU-MEDROL) injection  40 mg Intravenous Q12H  . metoprolol tartrate  25 mg Oral BID  . mometasone-formoterol  2 puff Inhalation BID  . pravastatin  20 mg Oral Daily  . pregabalin  100 mg Oral  BID  . protein supplement  2 oz Oral QID  . traZODone  100 mg Oral QHS  . vancomycin  125 mg Oral Daily   Followed by  . [START ON 06/19/2019] vancomycin  125 mg Oral QODAY   Followed by  . [START ON 06/27/2019]  vancomycin  125 mg Oral Q3 days   Continuous Infusions: . lactated ringers    . sodium chloride 0.45 % with kcl 50 mL/hr at 06/12/19 0927     LOS: 22 days    Time spent: 30 minutes    Jadie Comas Darleen Crocker, DO [facility] Pager [phone]  If 7PM-7AM, please contact night-coverage [email or web address] Password [facility] 06/12/2019, 11:17 AM

## 2019-06-12 NOTE — Progress Notes (Signed)
Subjective:  Patient continues to have multiple loose stools.  She says she is already had 5 or 6 this morning.  Stool is like a paced and medium volume.  No gross blood noted.  Abdominal pain comes and goes and not as pronounced as couple of days ago.  Her appetite is fair.  She did not eat much for breakfast because she did not like the food and her husband brought her chicken sandwich and she ate half of that.  Objective: Blood pressure 107/69, pulse 90, temperature 98 F (36.7 C), temperature source Oral, resp. rate 20, height _0  (1.626 m), weight 88.1 kg, SpO2 97 %. Patient is alert and in no acute distress. Abdomen is full with normal bowel sounds.  On palpation abdomen is soft and nontender with organomegaly or masses. No peripheral edema noted.    Labs/studies Results:   CBC Latest Ref Rng & Units 06/12/2019 06/08/2019 06/02/2019  WBC 4.0 - 10.5 K/uL 8.7 - 8.6  Hemoglobin 12.0 - 15.0 g/dL 11.2(L) 11.6(L) 10.3(L)  Hematocrit 36.0 - 46.0 % 35.9(L) 37.2 33.2(L)  Platelets 150 - 400 K/uL 344 - 295    CMP Latest Ref Rng & Units 06/12/2019 06/11/2019 06/10/2019  Glucose 70 - 99 mg/dL 191(H) 105(H) 103(H)  BUN 8 - 23 mg/dL 5(L) 5(L) <5(L)  Creatinine 0.44 - 1.00 mg/dL 0.78 0.92 0.83  Sodium 135 - 145 mmol/L 137 138 138  Potassium 3.5 - 5.1 mmol/L 4.6 3.8 4.3  Chloride 98 - 111 mmol/L 106 107 106  CO2 22 - 32 mmol/L _1 Calcium 8.9 - 10.3 mg/dL 8.5(L) 8.3(L) 8.3(L)  Total Protein 6.5 - 8.1 g/dL - - -  Total Bilirubin 0.3 - 1.2 mg/dL - - -  Alkaline Phos 38 - 126 U/L - - -  AST 15 - 41 U/L - - -  ALT 0 - 44 U/L - - -    Hepatic Function Latest Ref Rng & Units 05/21/2019 05/20/2019 03/10/2019  Total Protein 6.5 - 8.1 g/dL 6.6 7.2 6.7  Albumin 3.5 - 5.0 g/dL 3.2(L) 3.5 -  AST 15 - 41 U/L 14(L) 13(L) 10  ALT 0 - 44 U/L _2 Alk Phosphatase 38 - 126 U/L 79 88 -  Total Bilirubin 0.3 - 1.2 mg/dL 0.6 0.7 0.4    Lab Results  Component Value Date   CRP 6.6 03/10/2019       Assessment:  #1.  Ulcerative colitis.  Patient with chronic ulcerative colitis now with flareup possibly caused by C. difficile colitis.  Colonoscopy in February 2018 revealed her to be in remission.  She did not respond to budesonide and hydrocortisone enemas.  Therefore begun on Solu-Medrol yesterday.   No significant change in the last 24 hours.  Patient remains on balsalazide.  #2.  C. difficile colitis.  C. difficile testing positive for antigen and toxin on 05/22/2019.  Patient was treated with vancomycin diarrhea persisted.  Follow-up C. difficile testing negative for antigen and toxin 2 days ago.  She is at high risk for relapse given underlying inflammatory bowel disease and therefore is being maintained on p.o. vancomycin which will be continued for at least 1 more week.  #3.  Diabetes mellitus.  Patient is on sliding scale coverage with NovoLog.  Only slight bump with IV steroids.  #4.  Anemia secondary to GI blood loss and acute illness.  Insignificant drop in H&H in the last 4 days.   Recommendations  Continue Solu-Medrol 40 mg  IV every 12 hours. Continue sliding-scale coverage for hyperglycemia. Change diet to modified carb diet.

## 2019-06-12 NOTE — Progress Notes (Signed)
Pt's current b/p 111/48 with dinamap. Heart rate registering 80 - 120's via pulse-ox, verified apically. Heart sounds irregular since coming on shift this am, but were in 70 - 80's this am. Patient denies any SOB or chest pain. Dr. Manuella Ghazi notified, no new orders at this time. Pt resting quietly in bed awaiting dinner meal tray.

## 2019-06-12 NOTE — Progress Notes (Signed)
Attempted to administer Dulera MDI at 0729 patient on bedside commode and needed to be cleaned up. I checked  in again at 0800 patient on commode.  Will try after breakfast to administer MDI.

## 2019-06-13 LAB — BASIC METABOLIC PANEL
Anion gap: 6 (ref 5–15)
BUN: 6 mg/dL — ABNORMAL LOW (ref 8–23)
CO2: 26 mmol/L (ref 22–32)
Calcium: 8.5 mg/dL — ABNORMAL LOW (ref 8.9–10.3)
Chloride: 106 mmol/L (ref 98–111)
Creatinine, Ser: 0.81 mg/dL (ref 0.44–1.00)
GFR calc Af Amer: >60 mL/min (ref >60)
GFR calc non Af Amer: >60 mL/min (ref >60)
Glucose, Bld: 189 mg/dL — ABNORMAL HIGH (ref 70–99)
Potassium: 3.8 mmol/L (ref 3.5–5.1)
Sodium: 138 mmol/L (ref 135–145)

## 2019-06-13 LAB — CBC
HCT: 36.2 % (ref 36.0–46.0)
Hemoglobin: 11.3 g/dL — ABNORMAL LOW (ref 12.0–15.0)
MCH: 29.7 pg (ref 26.0–34.0)
MCHC: 31.2 g/dL (ref 30.0–36.0)
MCV: 95 fL (ref 80.0–100.0)
Platelets: 375 10*3/uL (ref 150–400)
RBC: 3.81 MIL/uL — ABNORMAL LOW (ref 3.87–5.11)
RDW: 16.1 % — ABNORMAL HIGH (ref 11.5–15.5)
WBC: 9.9 10*3/uL (ref 4.0–10.5)
nRBC: 0 % (ref 0.0–0.2)

## 2019-06-13 LAB — GLUCOSE, CAPILLARY
Glucose-Capillary: 162 mg/dL — ABNORMAL HIGH (ref 70–99)
Glucose-Capillary: 178 mg/dL — ABNORMAL HIGH (ref 70–99)
Glucose-Capillary: 197 mg/dL — ABNORMAL HIGH (ref 70–99)
Glucose-Capillary: 206 mg/dL — ABNORMAL HIGH (ref 70–99)

## 2019-06-13 MED ORDER — POTASSIUM CHLORIDE IN NACL 20-0.45 MEQ/L-% IV SOLN
INTRAVENOUS | Status: DC
  Administered 2019-06-13 – 2019-06-14 (×2): via INTRAVENOUS

## 2019-06-13 NOTE — Progress Notes (Signed)
PROGRESS NOTE    [name]  TRZ:[id number] DOB: 1950-11-07 DOA: 05/20/2019 PCP: Jalene Mullet, PA-C   Brief Narrative:  68 year old female with recent diagnosis of ulcerative colitis presented with C. difficile positive diarrhea, abdominal pain and diffuse pancolitis, followed closely by GI service.  9/9: Patient continues to have frequent watery bowel movements today. Potassium levels are high-normal and will stop supplementation and follow labs in a.m.  9/10:Patient continues to have frequent watery bowel movements at least greater than 10 in a 24-hour period. Potassium is once again downtrending. She is tolerating some of her diet. GI plans to get CMV stain and is considering colonoscopy in a.m.  9/11:Patient continues to have frequent, watery bowel movements with CMV stain pending. GI considering colonoscopy based on these results.  9/12:Patient continues to have crampy abdominal pain as well as multiple bowel movements. Repeat C. difficile testing negative. GI has started patient on IV Solu-Medrol.  9/13: Patient continues to have frequent, watery bowel movements.  She complains of a mild cough this morning and chest x-ray obtained with no acute findings noted.  Continues to remain on IV Solu-Medrol with GI following.  9/14: Patient continues have similar clinical presentation, but feels like she might be slowly improving.  She denies any further coughing this morning.  Heart rates noted to be elevated yesterday for which metoprolol dose has been increased with improved control.  Assessment & Plan:   Active Problems:   Colitis   Enteritis due to Clostridium difficile   Tachycardia   IBD (inflammatory bowel disease)   Hematochezia   C. difficile pancolitis in the setting of ulcerative colitis with associated flare-persistent -Continue oral vancomycin taper per GIfor at least another week due to increased risk of relapse -Continue balsalazide and begin  methylprednisolone 40 mg IV twice daily on 9/12 -Continue on lactosefree full liquid diet with goal of less than 5 BMs daily -Continue Bentyl twice daily -CMV staining noted to be negative  Cough/congestion-improved -Chest x-ray ordered with no acute findings -Mucinex ordered for now  Dysuria-resolved -UA without signs of infection  Transient tachyarrhythmia in the setting of chronic atrial fibrillation -Continue to monitor closely as she is currently in sinus rhythm -Continue increased dose of metoprolol to 50 mg twice daily on 9/13 -Continue a apixaban for full anticoagulation  Hypotension-improved -Likely secondary to dehydration that is difficult to treat in the setting of frequent BMs -Continue IV fluid  COPD -Currently stable and use bronchodilators as needed  Anxiety/depression -Continue home medication  Recurrent hypokalemia-stable -Currently  back on the lower end and will resume IV fluids with potassium supplementation -Recheck a.m. labs along with magnesium  DVT prophylaxis:Apixaban Code Status:Full Family Communication:Husband at bedside Disposition Plan:Continue IVF hydration and current treatments per GI.Possible colonoscopysoon versus watchful waiting.   Consultants:  GI  Procedures:  Flex sig 06/01/19  Antimicrobials:  Anti-infectives (From admission, onward)   Start     Dose/Rate Route Frequency Ordered Stop   06/27/19 1000  vancomycin (VANCOCIN) 50 mg/mL oral solution 125 mg     125 mg Oral Every 3 DAYS 06/02/19 1201 07/06/19 0959   06/19/19 1000  vancomycin (VANCOCIN) 50 mg/mL oral solution 125 mg     125 mg Oral Every other day 06/02/19 1201 06/27/19 0959   06/12/19 1000  vancomycin (VANCOCIN) 50 mg/mL oral solution 125 mg     125 mg Oral Daily 06/02/19 1201 06/19/19 0959   06/04/19 2200  vancomycin (VANCOCIN) 50 mg/mL oral solution 125 mg  125 mg Oral 2 times daily 06/02/19 1201 06/11/19 1109   06/02/19 1400  vancomycin  (VANCOCIN) 50 mg/mL oral solution 125 mg     125 mg Oral 4 times daily 06/02/19 1201 06/04/19 0934   06/02/19 1000  fluconazole (DIFLUCAN) tablet 150 mg     150 mg Oral Daily 06/02/19 0854 06/03/19 0945   05/28/19 1800  vancomycin (VANCOCIN) 50 mg/mL oral solution 500 mg  Status:  Discontinued     500 mg Oral 4 times daily 05/28/19 1605 06/02/19 1201   05/22/19 1400  vancomycin (VANCOCIN) 50 mg/mL oral solution 125 mg  Status:  Discontinued     125 mg Oral 4 times daily 05/22/19 1233 05/28/19 1605   05/21/19 0800  ciprofloxacin (CIPRO) IVPB 400 mg  Status:  Discontinued     400 mg 200 mL/hr over 60 Minutes Intravenous Every 12 hours 05/21/19 0213 05/22/19 1233   05/21/19 0400  metroNIDAZOLE (FLAGYL) IVPB 500 mg  Status:  Discontinued     500 mg 100 mL/hr over 60 Minutes Intravenous Every 8 hours 05/21/19 0213 05/22/19 1233   05/20/19 2000  ciprofloxacin (CIPRO) tablet 500 mg  Status:  Discontinued     500 mg Oral  Once 05/20/19 1951 05/20/19 1952   05/20/19 2000  metroNIDAZOLE (FLAGYL) tablet 500 mg  Status:  Discontinued     500 mg Oral  Once 05/20/19 1951 05/20/19 1952   05/20/19 2000  ciprofloxacin (CIPRO) IVPB 400 mg     400 mg 200 mL/hr over 60 Minutes Intravenous  Once 05/20/19 1952 05/20/19 2147   05/20/19 2000  metroNIDAZOLE (FLAGYL) IVPB 500 mg     500 mg 100 mL/hr over 60 Minutes Intravenous  Once 05/20/19 1952 05/20/19 2148       Subjective: Patient seen and evaluated today with no new acute complaints or concerns. No acute concerns or events noted overnight.  She states that she has had 3 loose, watery bowel movements this morning, but did not have too many overnight.  She seems to think that she is showing some slow improvement.  Objective: Vitals:   06/13/19 0503 06/13/19 0722 06/13/19 0952 06/13/19 0953  BP: 119/77  108/77 108/77  Pulse: 83  (!) 107 88  Resp: 18     Temp: 98.1 F (36.7 C)     TempSrc: Oral     SpO2: 94% 96%    Weight:      Height:         Intake/Output Summary (Last 24 hours) at 06/13/2019 0957 Last data filed at 06/13/2019 0400 Gross per 24 hour  Intake 2019.32 ml  Output -  Net 2019.32 ml   Filed Weights   06/10/19 0333 06/12/19 0447 06/13/19 0500  Weight: 89.4 kg 88.1 kg 89.6 kg    Examination:  General exam: Appears calm and comfortable  Respiratory system: Clear to auscultation. Respiratory effort normal. Cardiovascular system: S1 & S2 heard, RRR. No JVD, murmurs, rubs, gallops or clicks. No pedal edema. Gastrointestinal system: Abdomen is nondistended, soft and nontender. No organomegaly or masses felt. Normal bowel sounds heard. Central nervous system: Alert and oriented. No focal neurological deficits. Extremities: Symmetric 5 x 5 power. Skin: No rashes, lesions or ulcers Psychiatry: Judgement and insight appear normal. Mood & affect appropriate.     Data Reviewed: I have personally reviewed following labs and imaging studies  CBC: Recent Labs  Lab 06/08/19 1107 06/12/19 0607 06/13/19 0630  WBC  --  8.7 9.9  HGB 11.6* 11.2* 11.3*  HCT 37.2 35.9* 36.2  MCV  --  95.2 95.0  PLT  --  344 935   Basic Metabolic Panel: Recent Labs  Lab 06/08/19 0456 06/09/19 0515 06/10/19 0424 06/11/19 0627 06/12/19 0607 06/13/19 0630  NA 137 138 138 138 137 138  K 5.1 3.9 4.3 3.8 4.6 3.8  CL 107 105 106 107 106 106  CO2 27 27 25 25 25 26   GLUCOSE 112* 106* 103* 105* 191* 189*  BUN 5* <5* <5* 5* 5* 6*  CREATININE 0.75 0.78 0.83 0.92 0.78 0.81  CALCIUM 8.4* 8.5* 8.3* 8.3* 8.5* 8.5*  MG 2.0 2.1  --   --  1.8  --    GFR: Estimated Creatinine Clearance: 71.1 mL/min (by C-G formula based on SCr of 0.81 mg/dL). Liver Function Tests: No results for input(s): AST, ALT, ALKPHOS, BILITOT, PROT, ALBUMIN in the last 168 hours. No results for input(s): LIPASE, AMYLASE in the last 168 hours. No results for input(s): AMMONIA in the last 168 hours. Coagulation Profile: No results for input(s): INR, PROTIME in the last  168 hours. Cardiac Enzymes: No results for input(s): CKTOTAL, CKMB, CKMBINDEX, TROPONINI in the last 168 hours. BNP (last 3 results) No results for input(s): PROBNP in the last 8760 hours. HbA1C: No results for input(s): HGBA1C in the last 72 hours. CBG: Recent Labs  Lab 06/12/19 0739 06/12/19 1125 06/12/19 1603 06/12/19 2108 06/13/19 0811  GLUCAP 164* 192* 177* 161* 162*   Lipid Profile: No results for input(s): CHOL, HDL, LDLCALC, TRIG, CHOLHDL, LDLDIRECT in the last 72 hours. Thyroid Function Tests: No results for input(s): TSH, T4TOTAL, FREET4, T3FREE, THYROIDAB in the last 72 hours. Anemia Panel: No results for input(s): VITAMINB12, FOLATE, FERRITIN, TIBC, IRON, RETICCTPCT in the last 72 hours. Sepsis Labs: No results for input(s): PROCALCITON, LATICACIDVEN in the last 168 hours.  Recent Results (from the past 240 hour(s))  C difficile quick scan w PCR reflex     Status: None   Collection Time: 06/10/19  6:25 PM   Specimen: STOOL  Result Value Ref Range Status   C Diff antigen NEGATIVE NEGATIVE Final   C Diff toxin NEGATIVE NEGATIVE Final   C Diff interpretation No C. difficile detected.  Final    Comment: Performed at [facility], [address], [place], [place] [address]         Radiology Studies: Dg Chest [facility] 1 View  Result Date: 06/12/2019 CLINICAL DATA:  Productive cough. EXAM: PORTABLE CHEST 1 VIEW COMPARISON:  05/20/2019 FINDINGS: Lordotic technique is demonstrated. Lungs are adequately inflated without consolidation or effusion. Borderline stable cardiomegaly. Large hiatal hernia. Remainder of the exam is unchanged. IMPRESSION: No acute cardiopulmonary disease. Large hiatal hernia. Electronically Signed   By: Marin Olp M.D.   On: 06/12/2019 11:06        Scheduled Meds: . apixaban  5 mg Oral BID  . balsalazide  2,250 mg Oral TID  . buPROPion  150 mg Oral Daily  . busPIRone  7.5 mg Oral BID  . dextromethorphan-guaiFENesin  1 tablet Oral BID  .  dicyclomine  10 mg Oral BID AC  . DULoxetine  120 mg Oral Daily  . famotidine  20 mg Oral BID AC  . insulin aspart  0-5 Units Subcutaneous QHS  . insulin aspart  0-9 Units Subcutaneous TID WC  . ketotifen  1 drop Both Eyes Daily  . lidocaine  1 patch Transdermal Q24H  . loratadine  10 mg Oral Daily  . methylPREDNISolone (SOLU-MEDROL) injection  40 mg Intravenous Q12H  . metoprolol tartrate  50 mg Oral BID  . mometasone-formoterol  2 puff Inhalation BID  . pravastatin  20 mg Oral Daily  . pregabalin  100 mg Oral BID  . traZODone  100 mg Oral QHS  . vancomycin  125 mg Oral Daily   Followed by  . [START ON 06/19/2019] vancomycin  125 mg Oral QODAY   Followed by  . [START ON 06/27/2019] vancomycin  125 mg Oral Q3 days   Continuous Infusions: . 0.45 % NaCl with KCl 20 mEq / L 75 mL/hr at 06/13/19 0940     LOS: 23 days    Time spent: 30 minutes    Pratik D Manuella Ghazi, DO [facility] Pager [phone]  If 7PM-7AM, please contact night-coverage [email or web address] Password [facility] 06/13/2019, 9:57 AM

## 2019-06-13 NOTE — Progress Notes (Addendum)
    Subjective: No abdominal pain. States she feels much improved. 5 BMs thus far today but states this is improved from previously. Consistency is improved. Less rectal bleeding. Eating a breakfast sandwich from [employer]. She states vancomycin causes urgency for BM, which has been going on the whole admission. Overall feels improved.   Objective: Vital signs in last 24 hours: Temp:  [97.9 F (36.6 C)-98.1 F (36.7 C)] 98.1 F (36.7 C) (09/14 0503) Pulse Rate:  [83-113] 88 (09/14 0953) Resp:  [18] 18 (09/14 0503) BP: (103-135)/(48-82) 108/77 (09/14 0953) SpO2:  [93 %-96 %] 96 % (09/14 0722) Weight:  [89.6 kg] 89.6 kg (09/14 0500) Last BM Date: 06/12/19 General:   Alert and oriented, pleasant Head:  Normocephalic and atraumatic. Abdomen:  Bowel sounds present, soft, non-tender, non-distended. No HSM or hernias noted. No rebound or guarding.  Extremities:  Without edema. Neurologic:  Alert and  oriented x4  Intake/Output from previous day: 09/13 0701 - 09/14 0700 In: 2259.3 [P.O.:840; I.V.:1419.3] Out: -  Intake/Output this shift: Total I/O In: 240 [P.O.:240] Out: -   Lab Results: Recent Labs    06/12/19 0607 06/13/19 0630  WBC 8.7 9.9  HGB 11.2* 11.3*  HCT 35.9* 36.2  PLT 344 375   BMET Recent Labs    06/11/19 0627 06/12/19 0607 06/13/19 0630  NA 138 137 138  K 3.8 4.6 3.8  CL 107 106 106  CO2 25 25 26   GLUCOSE 105* 191* 189*  BUN 5* 5* 6*  CREATININE 0.92 0.78 0.81  CALCIUM 8.3* 8.5* 8.5*    Studies/Results: Dg Chest Port 1 View  Result Date: 06/12/2019 CLINICAL DATA:  Productive cough. EXAM: PORTABLE CHEST 1 VIEW COMPARISON:  05/20/2019 FINDINGS: Lordotic technique is demonstrated. Lungs are adequately inflated without consolidation or effusion. Borderline stable cardiomegaly. Large hiatal hernia. Remainder of the exam is unchanged. IMPRESSION: No acute cardiopulmonary disease. Large hiatal hernia. Electronically Signed   By: Marin Olp M.D.   On:  06/12/2019 11:06    Assessment: 68 year old female with history of UC and presenting with Cdiff this admission. Initially on vancomycin 125 mg 4 times daily orally, increased to 500 mg orally 4 times a day on August 29. Colazal increased to maximum dose on August 27 for concerns of exacerbation of underlying UC. Flex sig this admission with severe proctocolitis. CMV negative. She did not improve with Entocort, which was started 9/2, or hydrocortisone enemas. Questran with worsening of diarrhea. Current dosage of vancomycin is 125 mg QID for another week with taper to follow. IV Solu-Medrol 40 mg BID started 9/12, receiving 4 doses thus far and noting clinical improvement this morning.  Stool consistency improved although still with frequent stools. Intermittent rectal bleeding has lessened. Overall, she notes significant improvement over last few days.   Plan: Continue IV solu-medrol for now, transitioning to oral in near future Continue Colazal Continue Bentyl BID Continue Vancomycin through the 20th with likely taper thereafter due to increased risk of recurrence If continues to improve, hopeful discharge in next 24-48 hours   Annitta Needs, PhD, ANP-BC [facility]     LOS: 23 days    06/13/2019, 11:11 AM

## 2019-06-13 NOTE — Care Management Important Message (Signed)
Important Message  Patient Details  Name: [name] MRN: [id number] Date of Birth: Sep 26, 1951   Medicare Important Message Given:  Yes(copy given to nurse due to contact precautions)     Tommy Medal 06/13/2019, 12:28 PM

## 2019-06-14 ENCOUNTER — Telehealth: Payer: Self-pay | Admitting: *Deleted

## 2019-06-14 LAB — GLUCOSE, CAPILLARY
Glucose-Capillary: 181 mg/dL — ABNORMAL HIGH (ref 70–99)
Glucose-Capillary: 248 mg/dL — ABNORMAL HIGH (ref 70–99)
Glucose-Capillary: 185 mg/dL — ABNORMAL HIGH (ref 70–99)
Glucose-Capillary: 317 mg/dL — ABNORMAL HIGH (ref 70–99)

## 2019-06-14 LAB — BASIC METABOLIC PANEL
Anion gap: 4 — ABNORMAL LOW (ref 5–15)
BUN: 8 mg/dL (ref 8–23)
CO2: 28 mmol/L (ref 22–32)
Calcium: 8.5 mg/dL — ABNORMAL LOW (ref 8.9–10.3)
Chloride: 105 mmol/L (ref 98–111)
Creatinine, Ser: 0.76 mg/dL (ref 0.44–1.00)
GFR calc Af Amer: >60 mL/min (ref >60)
GFR calc non Af Amer: >60 mL/min (ref >60)
Glucose, Bld: 195 mg/dL — ABNORMAL HIGH (ref 70–99)
Potassium: 3.8 mmol/L (ref 3.5–5.1)
Sodium: 137 mmol/L (ref 135–145)

## 2019-06-14 LAB — MAGNESIUM: Magnesium: 2 mg/dL (ref 1.7–2.4)

## 2019-06-14 MED ORDER — PREDNISONE 20 MG PO TABS
60.0000 mg | ORAL_TABLET | Freq: Once | ORAL | Status: AC
  Administered 2019-06-14: 60 mg via ORAL
  Filled 2019-06-14: qty 3

## 2019-06-14 MED ORDER — ALUM & MAG HYDROXIDE-SIMETH 200-200-20 MG/5ML PO SUSP
30.0000 mL | Freq: Four times a day (QID) | ORAL | Status: DC | PRN
  Administered 2019-06-14: 30 mL via ORAL
  Filled 2019-06-14: qty 30

## 2019-06-14 MED ORDER — PREDNISONE 20 MG PO TABS
60.0000 mg | ORAL_TABLET | Freq: Every day | ORAL | Status: DC
  Administered 2019-06-15: 60 mg via ORAL
  Filled 2019-06-14: qty 3

## 2019-06-14 NOTE — Telephone Encounter (Signed)
Patient called and stated that she hasn't been able to come and get her allergy injections because she has been in the hospital and that she is still currently in the hospital. Advised to patient that once she is feeling better we will determine where to get her back on track with her allergy injections. Patient verbalized understanding.

## 2019-06-14 NOTE — Telephone Encounter (Signed)
Noted! Thank you

## 2019-06-14 NOTE — Progress Notes (Signed)
Pt eating breakfast 

## 2019-06-14 NOTE — Progress Notes (Signed)
Subjective: 2 BMs today. Maybe 1-2 BMs during the night. Slept really well. Stools are still watery, but getting more form. Urgency hand improved. Continues with blood in the stool with every BM. Blood is not as bright red and has decreased in amount. Occasional dizziness. No nausea or vomiting. Tolerating diet well. Eating lunch.   Objective: Vital signs in last 24 hours: Temp:  [98.2 F (36.8 C)-99.5 F (37.5 C)] 98.2 F (36.8 C) (09/15 0503) Pulse Rate:  [90-100] 90 (09/15 0503) Resp:  [15-16] 16 (09/15 0503) BP: (123-126)/(75-91) 126/75 (09/15 0503) SpO2:  [92 %-95 %] 94 % (09/15 0848) Weight:  [91.8 kg] 91.8 kg (09/15 0503) Last BM Date: 06/14/19 General:   Alert and oriented, pleasant Head:  Normocephalic and atraumatic. Eyes:  No icterus, sclera clear. Conjuctiva pink.  Abdomen:  Bowel sounds present, soft, non-tender, non-distended. No HSM or hernias noted. No rebound or guarding. No masses appreciated  Msk:  Symmetrical without gross deformities. Normal posture. Extremities:  Without edema. Neurologic:  Alert and  oriented x4;  grossly normal neurologically. Skin:  Warm and dry, intact without significant lesions.  Psych: Normal mood and affect.  Intake/Output from previous day: 09/14 0701 - 09/15 0700 In: 1400 [P.O.:800; I.V.:600] Out: -  Intake/Output this shift: No intake/output data recorded.  Lab Results: Recent Labs    06/12/19 0607 06/13/19 0630  WBC 8.7 9.9  HGB 11.2* 11.3*  HCT 35.9* 36.2  PLT 344 375   BMET Recent Labs    06/12/19 0607 06/13/19 0630 06/14/19 0544  NA 137 138 137  K 4.6 3.8 3.8  CL 106 106 105  CO2 25 26 28   GLUCOSE 191* 189* 195*  BUN 5* 6* 8  CREATININE 0.78 0.81 0.76  CALCIUM 8.5* 8.5* 8.5*   Assessment: 68 year old female with history of UC and presenting with Cdiff this admission. Initially on vancomycin 125 mg 4 times daily orally, increased to 500 mg orally 4 times a day on August 29. Colazalincreased to  maximum dose on August 27 for concerns of exacerbation of underlying UC. Flex sig this admission with severe proctocolitis. CMV negative. She did not improve with Entocort, which was started 9/2, or hydrocortisone enemas. Questran with worsening of diarrhea. Repeat C. diff testing on 9/11 with negative antigen and toxin. Current dosage of vancomycin is 125 mg QID for another week with taper to follow. IV Solu-Medrol 40 mg BID started 9/12 and Entocort and hydrocortisone enema discontinued. She has received 7 doses thus far and is clinically improving. Stool frequency has decreased and urgency has essentially resolved. She continues to have loose/watery stools, but stools are developing more form. Rectal bleeding continues, but is improving. Abdominal pain has resolved.   As patient continues to improve, will transition to oral prednisone for 2 weeks followed by a taper. One concern for outpatient management with oral prednisone is patietns diabetes. Glucose has been elevated since starting solumedrol and is currently being managed with insulin while inpatient; however, patient reports only taking Metformin outpatient for diabetes. She will need to follow-up with PCP regarding this.   Plan: Spoke with Dr. Gala Romney. Will discontinue solumedrol at this time and give one time dose of Prednisone 60 mg now.  Start oral Prednisone 60 mg daily x 2 weeks tomorrow morning followed by a taper of 50 mg x 1 week, 40 mg x 1 week, 30 mg x 1 week, 20 mg x 1 week, 15 mg x 1 week, 10 mg x 1 week, 5  mg x 1 week, then stop prednisone.  Taper may need to be adjusted based on patients clinical response.  Continue Colazal Continue Bentyl BID Continue Vancomycin through the 20th with likely taper thereafter due to increased risk of recurrence Hopefully discharge in the next 24-48 hours if she continues to do well on oral prednisone.  Follow-up with PCP once discharged for diabetes management in the setting of prednisone.        LOS: 24 days    06/14/2019, 12:18 PM   Aliene Altes, [facility]

## 2019-06-14 NOTE — Progress Notes (Signed)
PROGRESS NOTE    [name]  QBH:[id number] DOB: [birth year] DOA: 05/20/2019 PCP: Jalene Mullet, PA-C   Brief Narrative:  68 year old female with recent diagnosis of ulcerative colitis presented with C. difficile positive diarrhea, abdominal pain and diffuse pancolitis, followed closely by GI service.  9/9: Patient continues to have frequent watery bowel movements today. Potassium levels are high-normal and will stop supplementation and follow labs in a.m.  9/10:Patient continues to have frequent watery bowel movements at least greater than 10 in a 24-hour period. Potassium is once again downtrending. She is tolerating some of her diet. GI plans to get CMV stain and is considering colonoscopy in a.m.  9/11:Patient continues to have frequent, watery bowel movements with CMV stain pending. GI considering colonoscopy based on these results.  9/12:Patient continues to have crampy abdominal pain as well as multiple bowel movements. Repeat C. difficile testing negative. GI has started patient on IV Solu-Medrol.  9/13: Patient continues to have frequent, watery bowel movements. She complains of a mild cough this morning and chest x-ray obtained with no acute findings noted. Continues to remain on IV Solu-Medrol with GI following.  9/14: Patient continues have similar clinical presentation, but feels like she might be slowly improving.  She denies any further coughing this morning.  Heart rates noted to be elevated yesterday for which metoprolol dose has been increased with improved control.  9/15: Patient is overall much improved and having less frequent bowel movements.  GI has started on prednisone taper.  Hopeful discharge in the next 24-48 hours.  Assessment & Plan:   Active Problems:   Colitis   Enteritis due to Clostridium difficile   Tachycardia   IBD (inflammatory bowel disease)   Hematochezia   C. difficile pancolitis in the setting of ulcerative colitis with  associated flare-persistent -Continue oral vancomycin taper per GIfor at least another week due to increased risk of relapse -Continue Colazal -IV Solu-Medrol discontinued with prednisone taper initiated -Continue on lactosefree full liquid diet with goal of less than 5 BMs daily -Continue Bentyl twice daily -CMV staining noted to be negative  Cough/congestion-improved -Chest x-ray ordered with no acute findings -Mucinex ordered for now  Dysuria-resolved -UA without signs of infection  Transient tachyarrhythmia in the setting of chronic atrial fibrillation -Continue to monitor closely as she is currently in sinus rhythm -Continue increased dose of metoprolol to 50 mg twice daily on 9/13 -Continue a apixaban for full anticoagulation  Hypotension-improved -Likely secondary to dehydration that is difficult to treat in the setting of frequent BMs -Continue IV fluid  COPD -Currently stable and use bronchodilators as needed  Anxiety/depression -Continue home medication  Recurrent hypokalemia-stable -Due to clinical improvement we will discontinue IV fluid -Recheck labs in a.m. and if stable, can keep off IV fluid  DVT prophylaxis:Apixaban Code Status:Full Family Communication:Husband at bedside Disposition Plan:Start prednisone taper as recommended by GI and follow blood glucose readings.  Discontinue IV fluid at this time and follow a.m. labs.  Hopeful discharge in the next 24-48 hours if stable and improved on prednisone regimen.   Consultants:  GI  Procedures:  Flex sig 06/01/19  Antimicrobials:  Anti-infectives (From admission, onward)   Start     Dose/Rate Route Frequency Ordered Stop   06/27/19 1000  vancomycin (VANCOCIN) 50 mg/mL oral solution 125 mg     125 mg Oral Every 3 DAYS 06/02/19 1201 07/06/19 0959   06/19/19 1000  vancomycin (VANCOCIN) 50 mg/mL oral solution 125 mg     125  mg Oral Every other day 06/02/19 1201 06/27/19 0959   06/12/19  1000  vancomycin (VANCOCIN) 50 mg/mL oral solution 125 mg     125 mg Oral Daily 06/02/19 1201 06/19/19 0959   06/04/19 2200  vancomycin (VANCOCIN) 50 mg/mL oral solution 125 mg     125 mg Oral 2 times daily 06/02/19 1201 06/11/19 1109   06/02/19 1400  vancomycin (VANCOCIN) 50 mg/mL oral solution 125 mg     125 mg Oral 4 times daily 06/02/19 1201 06/04/19 0934   06/02/19 1000  fluconazole (DIFLUCAN) tablet 150 mg     150 mg Oral Daily 06/02/19 0854 06/03/19 0945   05/28/19 1800  vancomycin (VANCOCIN) 50 mg/mL oral solution 500 mg  Status:  Discontinued     500 mg Oral 4 times daily 05/28/19 1605 06/02/19 1201   05/22/19 1400  vancomycin (VANCOCIN) 50 mg/mL oral solution 125 mg  Status:  Discontinued     125 mg Oral 4 times daily 05/22/19 1233 05/28/19 1605   05/21/19 0800  ciprofloxacin (CIPRO) IVPB 400 mg  Status:  Discontinued     400 mg 200 mL/hr over 60 Minutes Intravenous Every 12 hours 05/21/19 0213 05/22/19 1233   05/21/19 0400  metroNIDAZOLE (FLAGYL) IVPB 500 mg  Status:  Discontinued     500 mg 100 mL/hr over 60 Minutes Intravenous Every 8 hours 05/21/19 0213 05/22/19 1233   05/20/19 2000  ciprofloxacin (CIPRO) tablet 500 mg  Status:  Discontinued     500 mg Oral  Once 05/20/19 1951 05/20/19 1952   05/20/19 2000  metroNIDAZOLE (FLAGYL) tablet 500 mg  Status:  Discontinued     500 mg Oral  Once 05/20/19 1951 05/20/19 1952   05/20/19 2000  ciprofloxacin (CIPRO) IVPB 400 mg     400 mg 200 mL/hr over 60 Minutes Intravenous  Once 05/20/19 1952 05/20/19 2147   05/20/19 2000  metroNIDAZOLE (FLAGYL) IVPB 500 mg     500 mg 100 mL/hr over 60 Minutes Intravenous  Once 05/20/19 1952 05/20/19 2148       Subjective: Patient seen and evaluated today with no new acute complaints or concerns. No acute concerns or events noted overnight.  She is overall doing better and has only had 2 bowel movements earlier this morning with 2 overnight.  Stools are becoming more formed.  She is tolerating  her diet well.  Objective: Vitals:   06/13/19 2114 06/14/19 0503 06/14/19 0848 06/14/19 1400  BP: (!) 125/91 126/75  (!) 124/99  Pulse: 100 90  99  Resp: 16 16    Temp: 99.5 F (37.5 C) 98.2 F (36.8 C)    TempSrc: Oral Oral    SpO2: 95% 94% 94% 95%  Weight:  91.8 kg    Height:        Intake/Output Summary (Last 24 hours) at 06/14/2019 1453 Last data filed at 06/13/2019 1700 Gross per 24 hour  Intake 840 ml  Output -  Net 840 ml   Filed Weights   06/12/19 0447 06/13/19 0500 06/14/19 0503  Weight: 88.1 kg 89.6 kg 91.8 kg    Examination:  General exam: Appears calm and comfortable  Respiratory system: Clear to auscultation. Respiratory effort normal. Cardiovascular system: S1 & S2 heard, RRR. No JVD, murmurs, rubs, gallops or clicks. No pedal edema. Gastrointestinal system: Abdomen is nondistended, soft and nontender. No organomegaly or masses felt. Normal bowel sounds heard. Central nervous system: Alert and oriented. No focal neurological deficits. Extremities: Symmetric 5 x 5 power.  Skin: No rashes, lesions or ulcers Psychiatry: Judgement and insight appear normal. Mood & affect appropriate.     Data Reviewed: I have personally reviewed following labs and imaging studies  CBC: Recent Labs  Lab 06/08/19 1107 06/12/19 0607 06/13/19 0630  WBC  --  8.7 9.9  HGB 11.6* 11.2* 11.3*  HCT 37.2 35.9* 36.2  MCV  --  95.2 95.0  PLT  --  344 229   Basic Metabolic Panel: Recent Labs  Lab 06/08/19 0456 06/09/19 0515 06/10/19 0424 06/11/19 0627 06/12/19 0607 06/13/19 0630 06/14/19 0544  NA 137 138 138 138 137 138 137  K 5.1 3.9 4.3 3.8 4.6 3.8 3.8  CL 107 105 106 107 106 106 105  CO2 27 27 25 25 25 26 28   GLUCOSE 112* 106* 103* 105* 191* 189* 195*  BUN 5* <5* <5* 5* 5* 6* 8  CREATININE 0.75 0.78 0.83 0.92 0.78 0.81 0.76  CALCIUM 8.4* 8.5* 8.3* 8.3* 8.5* 8.5* 8.5*  MG 2.0 2.1  --   --  1.8  --  2.0   GFR: Estimated Creatinine Clearance: 72.8 mL/min (by C-G  formula based on SCr of 0.76 mg/dL). Liver Function Tests: No results for input(s): AST, ALT, ALKPHOS, BILITOT, PROT, ALBUMIN in the last 168 hours. No results for input(s): LIPASE, AMYLASE in the last 168 hours. No results for input(s): AMMONIA in the last 168 hours. Coagulation Profile: No results for input(s): INR, PROTIME in the last 168 hours. Cardiac Enzymes: No results for input(s): CKTOTAL, CKMB, CKMBINDEX, TROPONINI in the last 168 hours. BNP (last 3 results) No results for input(s): PROBNP in the last 8760 hours. HbA1C: No results for input(s): HGBA1C in the last 72 hours. CBG: Recent Labs  Lab 06/13/19 1126 06/13/19 1615 06/13/19 2115 06/14/19 0733 06/14/19 1138  GLUCAP 178* 197* 206* 181* 248*   Lipid Profile: No results for input(s): CHOL, HDL, LDLCALC, TRIG, CHOLHDL, LDLDIRECT in the last 72 hours. Thyroid Function Tests: No results for input(s): TSH, T4TOTAL, FREET4, T3FREE, THYROIDAB in the last 72 hours. Anemia Panel: No results for input(s): VITAMINB12, FOLATE, FERRITIN, TIBC, IRON, RETICCTPCT in the last 72 hours. Sepsis Labs: No results for input(s): PROCALCITON, LATICACIDVEN in the last 168 hours.  Recent Results (from the past 240 hour(s))  C difficile quick scan w PCR reflex     Status: None   Collection Time: 06/10/19  6:25 PM   Specimen: STOOL  Result Value Ref Range Status   C Diff antigen NEGATIVE NEGATIVE Final   C Diff toxin NEGATIVE NEGATIVE Final   C Diff interpretation No C. difficile detected.  Final    Comment: Performed at [facility], [address], [place], [place] [address]         Radiology Studies: No results found.      Scheduled Meds: . apixaban  5 mg Oral BID  . balsalazide  2,250 mg Oral TID  . buPROPion  150 mg Oral Daily  . busPIRone  7.5 mg Oral BID  . dextromethorphan-guaiFENesin  1 tablet Oral BID  . dicyclomine  10 mg Oral BID AC  . DULoxetine  120 mg Oral Daily  . famotidine  20 mg Oral BID AC  .  insulin aspart  0-5 Units Subcutaneous QHS  . insulin aspart  0-9 Units Subcutaneous TID WC  . ketotifen  1 drop Both Eyes Daily  . lidocaine  1 patch Transdermal Q24H  . loratadine  10 mg Oral Daily  . metoprolol tartrate  50  mg Oral BID  . mometasone-formoterol  2 puff Inhalation BID  . pravastatin  20 mg Oral Daily  . [START ON 06/15/2019] predniSONE  60 mg Oral Q breakfast  . pregabalin  100 mg Oral BID  . traZODone  100 mg Oral QHS  . vancomycin  125 mg Oral Daily   Followed by  . [START ON 06/19/2019] vancomycin  125 mg Oral QODAY   Followed by  . [START ON 06/27/2019] vancomycin  125 mg Oral Q3 days   Continuous Infusions:   LOS: 24 days    Time spent: 30 minutes    Chandelle Harkey Darleen Crocker, DO [facility] Pager [phone]  If 7PM-7AM, please contact night-coverage [email or web address] Password [facility] 06/14/2019, 2:53 PM

## 2019-06-15 DIAGNOSIS — K51011 Ulcerative (chronic) pancolitis with rectal bleeding: Secondary | ICD-10-CM

## 2019-06-15 LAB — GLUCOSE, CAPILLARY
Glucose-Capillary: 137 mg/dL — ABNORMAL HIGH (ref 70–99)
Glucose-Capillary: 176 mg/dL — ABNORMAL HIGH (ref 70–99)

## 2019-06-15 LAB — BASIC METABOLIC PANEL
Anion gap: 7 (ref 5–15)
BUN: 11 mg/dL (ref 8–23)
CO2: 28 mmol/L (ref 22–32)
Calcium: 8.7 mg/dL — ABNORMAL LOW (ref 8.9–10.3)
Chloride: 103 mmol/L (ref 98–111)
Creatinine, Ser: 0.79 mg/dL (ref 0.44–1.00)
GFR calc Af Amer: >60 mL/min (ref >60)
GFR calc non Af Amer: >60 mL/min (ref >60)
Glucose, Bld: 162 mg/dL — ABNORMAL HIGH (ref 70–99)
Potassium: 3.8 mmol/L (ref 3.5–5.1)
Sodium: 138 mmol/L (ref 135–145)

## 2019-06-15 MED ORDER — BALSALAZIDE DISODIUM 750 MG PO CAPS: 2250.0000 mg | ORAL_CAPSULE | Freq: Three times a day (TID) | ORAL | 4 refills | Status: AC

## 2019-06-15 MED ORDER — METOPROLOL TARTRATE 50 MG PO TABS: 50.0000 mg | ORAL_TABLET | Freq: Two times a day (BID) | ORAL | 1 refills | Status: DC

## 2019-06-15 MED ORDER — PREDNISONE 10 MG PO TABS: ORAL_TABLET | ORAL | 0 refills | Status: DC

## 2019-06-15 MED ORDER — DICYCLOMINE HCL 10 MG PO CAPS: 10.0000 mg | ORAL_CAPSULE | Freq: Two times a day (BID) | ORAL | 4 refills | Status: DC

## 2019-06-15 MED ORDER — VANCOMYCIN 50 MG/ML ORAL SOLUTION: ORAL | 0 refills | Status: DC

## 2019-06-15 NOTE — Care Management Important Message (Signed)
Important Message  Patient Details  Name: [name] MRN: [id number] Date of Birth: 06/04/1951   Medicare Important Message Given:  Yes Given to nurse to place in room due to contact precautions    Tommy Medal 06/15/2019, 11:59 AM

## 2019-06-15 NOTE — Discharge Instructions (Signed)
Vancomycin instructions: Please take Vancomycin 1 tsp daily through 9/20, then 1 tsp every other day for one week, then 1 tsp every 3 days for one week.   Prednisone instructions:  Take 60 mg daily x 2 weeks followed by a taper of 50 mg x 1 week, 40 mg x 1 week, 30 mg x 1 week, 20 mg x 1 week, 15 mg x 1 week, 10 mg x 1 week, 5 mg x 1 week, then stop prednisone.     Colitis  Colitis is inflammation of the colon. Colitis may last a short time (be acute), or it may last a long time (become chronic). What are the causes? This condition may be caused by:  Viruses.  Bacteria.  Reaction to medicine.  Certain autoimmune diseases such as Crohn's disease or ulcerative colitis.  Radiation treatment.  Decreased blood flow to the bowel (ischemia). What are the signs or symptoms? Symptoms of this condition include:  Watery diarrhea.  Passing bloody or tarry stool.  Pain.  Fever.  Vomiting.  Tiredness (fatigue).  Weight loss.  Bloating.  Abdominal pain.  Having fewer bowel movements than usual.  A strong and sudden urge to have a bowel movement.  Feeling like the bowel is not empty after a bowel movement. How is this diagnosed? This condition is diagnosed with a stool test or a blood test. You may also have other tests, such as:  X-rays.  CT scan.  Colonoscopy.  Endoscopy.  Biopsy. How is this treated? Treatment for this condition depends on the cause. The condition may be treated by:  Resting the bowel. This involves not eating or drinking for a period of time.  Fluids that are given through an IV.  Medicine for pain and diarrhea.  Antibiotic medicines.  Cortisone medicines.  Surgery. Follow these instructions at home: Eating and drinking   Follow instructions from your health care provider about eating or drinking restrictions.  Drink enough fluid to keep your urine pale yellow.  Work with a dietitian to determine which foods cause your condition  to flare up.  Avoid foods that cause flare-ups.  Eat a well-balanced diet. General instructions  If you were prescribed an antibiotic medicine, take it as told by your health care provider. Do not stop taking the antibiotic even if you start to feel better.  Take over-the-counter and prescription medicines only as told by your health care provider.  Keep all follow-up visits as told by your health care provider. This is important. Contact a health care provider if:  Your symptoms do not go away.  You develop new symptoms. Get help right away if you:  Have a fever that does not go away with treatment.  Develop chills.  Have extreme weakness, fainting, or dehydration.  Have repeated vomiting.  Develop severe pain in your abdomen.  Pass bloody or tarry stool. Summary  Colitis is inflammation of the colon. Colitis may last a short time (be acute), or it may last a long time (become chronic).  Treatment for this condition depends on the cause and may include resting the bowel, taking medicines, or having surgery.  If you were prescribed an antibiotic medicine, take it as told by your health care provider. Do not stop taking the antibiotic even if you start to feel better.  Get help right away if you develop severe pain in your abdomen.  Keep all follow-up visits as told by your health care provider. This is important. This information is not intended to  replace advice given to you by your health care provider. Make sure you discuss any questions you have with your health care provider. Document Released: 10/23/2004 Document Revised: 03/18/2018 Document Reviewed: 03/18/2018 Elsevier Patient Education  2020 [place]  Crohn's disease is a long-lasting (chronic) disease that affects the gastrointestinal (GI) tract. Crohn's disease often causes irritation and inflammation in the small intestine and the beginning of the large intestine, but it can affect any  part of the GI tract. Crohn's disease is part of a group of illnesses that are known as inflammatory bowel disease (IBD). Crohn's disease may start slowly and get worse over time. Symptoms may come and go. They may also go away for months or even years at a time (remission). What are the causes? The exact cause of this condition is not known. It may involve a response that causes your body's disease-fighting (immune) system to attack healthy cells and tissues (autoimmune response). Bacteria, genes, and your environment may also play a role. What increases the risk? The following factors may make you more likely to develop this condition:  Having a family member who has Crohn's disease, another IBD, or an autoimmune condition.  Using products that contain nicotine or tobacco, such as cigarettes and e-cigarettes.  Being in your 36s.  Having [place] European ancestry. What are the signs or symptoms? The main symptoms of this condition involve your GI tract. These include:  Diarrhea.  Pain or cramping in the abdomen. This is commonly felt in the lower right side of the abdomen.  Frequent watery or bloody stools.  Constipation. This may mean having: ? Fewer bowel movements in a week than normal. ? Difficulty having a bowel movement. ? Stools that are dry, hard, or larger than normal.  Rectal bleeding.  Rectal pain.  An urgent need to have a bowel movement.  The feeling that you are not finished having a bowel movement. Other symptoms may include:  Unexplained weight loss.  Fatigue.  Fever.  Nausea.  Loss of appetite.  Joint pain.  Vision changes.  Red bumps or sores on the skin.  Sores inside the mouth. How is this diagnosed? This condition may be diagnosed based on:  Your symptoms and your medical history.  A physical exam.  Tests, which may include: ? Blood tests. ? Stool sample tests. ? Imaging tests, such as X-rays and CT scans. ? Tests to examine the  inside of your intestines using a long, flexible tube that has a light and a camera on the end (endoscopy or colonoscopy). ? A procedure to remove tissue samples from inside your bowel for testing (biopsy). You may need to work with a health care provider who specializes in diseases of the digestive tract (gastroenterologist). How is this treated? There is no cure for this condition, but treatment can help you manage your symptoms. Crohn's disease affects each person differently. Your treatment may include:  Resting your bowels. This involves having a period of healing time when your bowels are not passing stools. This may be done by: ? Drinking only clear liquids. These are liquids that you can see through, such as water, black coffee, fruit juice without pulp, broth, gelatin, and ice pops. ? Getting nutrition through an IV for a period of time.  Medicines. These may be used by themselves or with other treatments (combination therapy). These may include antibiotic medicines. You may be given medicines that help to: ? Reduce inflammation. ? Control your immune system activity. ?  Fight infections. ? Relieve cramps and prevent diarrhea. ? Control your pain.  Surgery. You may need surgery if: ? Medicines and other treatments are not working anymore. ? You develop complications from severe Crohn's disease. ? A section of your intestine becomes so damaged that it needs to be removed. Follow these instructions at home: Medicines  Take over-the-counter and prescription medicines only as told by your health care provider.  If you were prescribed an antibiotic, take it as told by your health care provider. Do not stop taking the antibiotic even if you start to feel better.  Avoid taking ibuprofen or other NSAID medicines if possible, these can make Crohn's disease worse. Eating and drinking  Talk with your health care provider or a diet and nutrition specialist (registered dietitian) about what  diet is best for you.  Drink enough fluid to keep your urine pale yellow.  If you are taking steroids to reduce inflammation, get plenty of calcium in your diet to help keep your bones healthy. You may also consider taking a calcium supplement with vitamin D.  Keep a food diary to identify foods that make your symptoms better or worse.  Avoid foods that cause symptoms.  Follow instructions from your health care provider about eating or drinking restrictions if you have worsening symptoms (flare-up).  Limit alcohol intake to no more than 1 drink a day for nonpregnant women and 2 drinks a day for men. One drink equals 12 oz of beer, 5 oz of wine, or 1 oz of hard liquor. General instructions  Make sure you get all the vaccines that your health care provider recommends, especially pneumonia (pneumococcal) and flu (influenza) vaccines.  Do not use any products that contain nicotine or tobacco, such as cigarettes and e-cigarettes. If you need help quitting, ask your health care provider.  Exercise every day, or as often told by your health care provider.  Keep all follow-up visits as told by your health care provider. This is important. Contact a health care provider if:  You have diarrhea, cramps in your abdomen, and other GI problems that are present almost all the time.  Your symptoms do not improve with treatment.  You continue to lose weight.  You develop a rash or sores on your skin.  You develop eye problems.  You have a fever.  Your symptoms get worse.  You develop new symptoms. Get help right away if:  You have bloody diarrhea.  You have severe pain in your abdomen.  You cannot pass stools. Summary  Crohn's disease affects each person differently. There are multiple treatment options that can help you manage the condition.  Talk with your health care provider or diet and nutrition specialist (registered dietitian) about what diet is best for you.  Make sure you  get all the vaccines that your health care provider recommends, especially pneumonia (pneumococcal) and flu (influenza) vaccines. This information is not intended to replace advice given to you by your health care provider. Make sure you discuss any questions you have with your health care provider. Document Released: 06/25/2005 Document Revised: 08/28/2017 Document Reviewed: 05/18/2017 Elsevier Patient Education  2020 [place].   IMPORTANT INFORMATION: PAY CLOSE ATTENTION   PHYSICIAN DISCHARGE INSTRUCTIONS  Follow with Primary care provider  Jalene Mullet PA-C  and other consultants as instructed by your Hospitalist Physician  [place] IF SYMPTOMS COME BACK, WORSEN OR NEW PROBLEM DEVELOPS   Please note: You were cared for  by a hospitalist during your hospital stay. Every effort will be made to forward records to your primary care provider.  You can request that your primary care provider send for your hospital records if they have not received them.  Once you are discharged, your primary care physician will handle any further medical issues. Please note that NO REFILLS for any discharge medications will be authorized once you are discharged, as it is imperative that you return to your primary care physician (or establish a relationship with a primary care physician if you do not have one) for your post hospital discharge needs so that they can reassess your need for medications and monitor your lab values.  Please get a complete blood count and chemistry panel checked by your Primary MD at your next visit, and again as instructed by your Primary MD.  Get Medicines reviewed and adjusted: Please take all your medications with you for your next visit with your Primary MD  Laboratory/radiological data: Please request your Primary MD to go over all hospital tests and procedure/radiological results at the follow up, please ask your primary care provider to  get all Hospital records sent to his/her office.  In some cases, they will be blood work, cultures and biopsy results pending at the time of your discharge. Please request that your primary care provider follow up on these results.  If you are diabetic, please bring your blood sugar readings with you to your follow up appointment with primary care.    Please call and make your follow up appointments as soon as possible.    Also Note the following: If you experience worsening of your admission symptoms, develop shortness of breath, life threatening emergency, suicidal or homicidal thoughts you must seek medical attention immediately by calling 911 or calling your MD immediately  if symptoms less severe.  You must read complete instructions/literature along with all the possible adverse reactions/side effects for all the Medicines you take and that have been prescribed to you. Take any new Medicines after you have completely understood and accpet all the possible adverse reactions/side effects.   Do not drive when taking Pain medications or sleeping medications (Benzodiazepines)  Do not take more than prescribed Pain, Sleep and Anxiety Medications. It is not advisable to combine anxiety,sleep and pain medications without talking with your primary care practitioner  Special Instructions: If you have smoked or chewed Tobacco  in the last 2 yrs please stop smoking, stop any regular Alcohol  and or any Recreational drug use.  Wear Seat belts while driving.  Do not drive if taking any narcotic, mind altering or controlled substances or recreational drugs or alcohol.

## 2019-06-15 NOTE — Progress Notes (Signed)
Subjective:  Only two bowel movements during the night. Stools are more solid, break apart in the toilet but no liquid. Less blood noted. No abdominal pain.   Objective: Vital signs in last 24 hours: Temp:  [99.4 F (37.4 C)] 99.4 F (37.4 C) (09/15 2135) Pulse Rate:  [81-101] 81 (09/16 0514) Resp:  [16-20] 20 (09/16 0514) BP: (114-124)/(69-99) 114/69 (09/16 0514) SpO2:  [92 %-95 %] 92 % (09/16 0824) Weight:  [89.5 kg] 89.5 kg (09/16 0433) Last BM Date: 06/14/19 General:   Alert,  Well-developed, well-nourished, pleasant and cooperative in NAD Head:  Normocephalic and atraumatic. Eyes:  Sclera clear, no icterus.  Abdomen:  Soft, nontender and nondistended.   Extremities:  Without clubbing, deformity or edema. Neurologic:  Alert and  oriented x4;  grossly normal neurologically. Skin:  Intact without significant lesions or rashes. Psych:  Alert and cooperative. Normal mood and affect.  Intake/Output from previous day: No intake/output data recorded. Intake/Output this shift: No intake/output data recorded.  Lab Results: CBC Recent Labs    06/13/19 0630  WBC 9.9  HGB 11.3*  HCT 36.2  MCV 95.0  PLT 375   BMET Recent Labs    06/13/19 0630 06/14/19 0544 06/15/19 0552  NA 138 137 138  K 3.8 3.8 3.8  CL 106 105 103  CO2 26 28 28   GLUCOSE 189* 195* 162*  BUN 6* 8 11  CREATININE 0.81 0.76 0.79  CALCIUM 8.5* 8.5* 8.7*   LFTs No results for input(s): BILITOT, BILIDIR, IBILI, ALKPHOS, AST, ALT, PROT, ALBUMIN in the last 72 hours. No results for input(s): LIPASE in the last 72 hours. PT/INR No results for input(s): LABPROT, INR in the last 72 hours.    Imaging Studies: Dg Chest 2 View  Result Date: 05/20/2019 CLINICAL DATA:  Abdominal cramping with nausea fever. EXAM: CHEST - 2 VIEW COMPARISON:  02/26/2018 FINDINGS: Cardiopericardial silhouette is at upper limits of normal for size. Hiatal hernia noted. The lungs are clear without focal pneumonia, edema, pneumothorax  or pleural effusion. Interstitial markings are diffusely coarsened with chronic features. The visualized bony structures of the thorax are intact. IMPRESSION: 1. No acute cardiopulmonary findings 2. Hiatal hernia. Electronically Signed   By: Misty Stanley M.D.   On: 05/20/2019 19:10   Ct Abdomen Pelvis W Contrast  Result Date: 05/20/2019 CLINICAL DATA:  Abdominal pain with diarrhea. EXAM: CT ABDOMEN AND PELVIS WITH CONTRAST TECHNIQUE: Multidetector CT imaging of the abdomen and pelvis was performed using the standard protocol following bolus administration of intravenous contrast. CONTRAST:  165m OMNIPAQUE IOHEXOL 300 MG/ML  SOLN COMPARISON:  05/01/2016 FINDINGS: Lower chest:  Large hiatal hernia. Hepatobiliary: No suspicious focal abnormality within the liver parenchyma. Gallbladder is surgically absent. No intrahepatic or extrahepatic biliary dilation. Pancreas: No focal mass lesion. No dilatation of the main duct. No intraparenchymal cyst. No peripancreatic edema. Spleen: No splenomegaly. No focal mass lesion. Adrenals/Urinary Tract: No adrenal nodule or mass. 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 Right kidney unremarkable. The urinary bladder appears normal for the degree of distention. Stomach/Bowel: Large hiatal hernia with approximately 75-80% of the stomach in the chest. Duodenum is normally positioned as is the ligament of Treitz. No small bowel wall thickening. No small bowel dilatation. Cecum is positioned in the anterior midline with unremarkable terminal ileum The appendix is not visualized, but there is no edema or inflammation in the region of the cecum. There is diffuse mild colonic wall thickening, most prominent  in the sigmoid segment but extending from the ascending colon to the level of the rectum. This is associated with pericolonic edema/inflammation. Diverticular changes are noted in the left colon. Vascular/Lymphatic: There is  abdominal aortic atherosclerosis without aneurysm. Celiac axis, SMA, and IMA are opacified. Portal vein and superior mesenteric vein are patent. There is no gastrohepatic or hepatoduodenal ligament lymphadenopathy. No intraperitoneal or retroperitoneal lymphadenopathy. No pelvic sidewall lymphadenopathy. Reproductive: The uterus is unremarkable.  There is no adnexal mass. Other: No intraperitoneal free fluid. Musculoskeletal: No worrisome lytic or sclerotic osseous abnormality. IMPRESSION: 1. Diffuse mild colonic wall thickening with pericolonic edema/inflammation. Imaging features are compatible with an infectious/inflammatory colitis. 2. Large hiatal hernia. 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 4. Abdominal aortic atherosclerosis. Aortic Atherosclerosis (ICD10-I70.0). Electronically Signed   By: Misty Stanley M.D.   On: 05/20/2019 19:06   Dg Chest Port 1 View  Result Date: 06/12/2019 CLINICAL DATA:  Productive cough. EXAM: PORTABLE CHEST 1 VIEW COMPARISON:  05/20/2019 FINDINGS: Lordotic technique is demonstrated. Lungs are adequately inflated without consolidation or effusion. Borderline stable cardiomegaly. Large hiatal hernia. Remainder of the exam is unchanged. IMPRESSION: No acute cardiopulmonary disease. Large hiatal hernia. Electronically Signed   By: Marin Olp M.D.   On: 06/12/2019 11:06   [place] Ekg Site Rite  Result Date: 06/05/2019 If Site Rite image not attached, placement could not be confirmed due to current cardiac rhythm. [2 weeks]   Assessment: 68 year old female with history of UC, presenting with C. difficile this admission.Initially on vancomycin 125 mg 4 times daily orally, increased to 500 mg orally 4 times a day on August 29. Colazalincreased to maximum dose on August 27 for concerns of exacerbation of underlying UC.Flex sig this admission with severe proctocolitis.CMV negative. She did not improve with Entocort,  which was started 9/2, or hydrocortisone enemas. Questran with worsening of diarrhea. Repeat C. diff testing on 9/11 with negative antigen and toxin. Current dosage of vancomycin is 125 mg QD with taper to follow. IV Solu-Medrol 40 mg BID started 9/12 (transitioned to oral prednisone 54m daily yesterday) and Entocort and hydrocortisone enema discontinued.  Stool frequency and consistency much improved since admission.  Not quite at baseline.  She reports 6 stools yesterday, 5 recorded by nursing staff.  Less blood noted.  2 BMs during the night per patient.  No abdominal pain and tolerating diet.  Discussed with patient, goal is not to be at baseline at time of discharge and we are pleased with recent improvements.  She is apprehensive about going home, fear of recurrent diarrhea.  Plan: 1. Prednisone 60 mg daily x 2 weeks followed by a taper of 50 mg x 1 week, 40 mg x 1 week, 30 mg x 1 week, 20 mg x 1 week, 15 mg x 1 week, 10 mg x 1 week, 5 mg x 1 week, then stop prednisone. Taper may need to be adjusted based on patients clinical response. 2. Continue Colazal 22571mTID 3. Continue Bentyl BID 4. Continue Vancomycin 12551maily through the 20th, then 125m67mD for one week, then 125mg94mry 3 days for one week.  5. F/u with PCP for diabetes management in setting of prednisone use.  6. Return to the office for follow up in 3 weeks.  7. Will sign off. Call with questions.   LesliLaureen OchsisBernarda CaffeyiBarkley Surgicenter Incroenter[facility] Associates [phone] AM     LOS: 25 days

## 2019-06-15 NOTE — Progress Notes (Signed)
Midline removed and no bleeding noted.  Gauze and tegaderm placed.  Scripts sent to pharmacy and vanc hard script in pack.  Husband to drive home

## 2019-06-15 NOTE — Discharge Summary (Signed)
Physician Discharge Summary  RULA KENISTON QVZ:[id number] DOB: [birth year] DOA: 05/20/2019  PCP: Jalene Mullet, PA-C GI: [facility]  Admit date: 05/20/2019 Discharge date: 06/15/2019  Admitted From: Home  Disposition: Home   Recommendations for Outpatient Follow-up:  1. Follow up with GI and PCP in 1-2 weeks  Vancomycin instructions: Please take Vancomycin 1 tsp daily through 9/20, then 1 tsp every other day for one week, then 1 tsp every 3 days for one week.   Prednisone instructions:  Take 60 mg daily x 2 weeks followed by a taper of 50 mg x 1 week, 40 mg x 1 week, 30 mg x 1 week, 20 mg x 1 week, 15 mg x 1 week, 10 mg x 1 week, 5 mg x 1 week, then stop prednisone.   Discharge Condition: STABLE   CODE STATUS: FULL    Brief Hospitalization Summary: Please see all hospital notes, images, labs for full details of the hospitalization.  Brief Admission Hx: 68 year old female with recent diagnosis of ulcerative colitis presented with C. difficile positive diarrhea, abdominal pain and diffuse pancolitis, followed closely by GI service.  Pt did not respond to the steroid enemas and was placed on IV solumedrol and then oral prednisone with improvement in symptoms.    MDM/Assessment & Plan:   1. C diff pancolitis-She has been treated with Cipro and Flagyl which has been discontinued and now being treated with oral vancomycin per GI recommendations.  She is now completing a 3 week taper of oral vancomycin per GI service.  2. Severe Ulcerative pancolitis -GI performed flex sig 9/2 with findings of severe pancolitis and biopsies with severe active chronic colitis with erosions consistent with ulcerative colitis diagnosis.  Started entocort 06/01/19 and received hydrocortisone enemas with no improvement and subsequently started on IV solumedrol.  Per GI ok to DC home with prednisone taper, vancomycin taper and close outpatient followup.   3. Transient tachyarrhythmia- patient had some  transient A. fib noted has been back in sinus rhythm.  No recurrence noted.  Increased dose of metoprolol to 50 mg twice daily which patient has tolerated well.  4. Hypokalemia-repleted.   5. Chronic atrial fibrillation- metoprolol increased to 50 mg twice daily, continue apixaban for full anticoagulation. 6. COPD-continue bronchodilators seems stable at this time. 7. Anxiety/depression- resume home medications.  Stable.  DVT prophylaxis: Apixaban Code Status: Full Family Communication: Husband Disposition Plan: Home   Consultants:  GI  Procedures:  Flex sig 06/01/2019  Antimicrobials:  Oral vancomycin Discharge Diagnoses:  Active Problems:   Colitis   Enteritis due to Clostridium difficile   Tachycardia   IBD (inflammatory bowel disease)   Hematochezia  Discharge Instructions:  Allergies as of 06/15/2019      Reactions   Mesalamine Other (See Comments)   Pancreatitis    Cefprozil Other (See Comments)   Aggravates Ulcerative colitis    Cefuroxime Axetil Other (See Comments)   Aggravates Ulcerative colitis    Morphine And Related Hives, Itching   Penicillins Hives   Has patient had a PCN reaction causing immediate rash, facial/tongue/throat swelling, SOB or lightheadedness with hypotension: No Has patient had a PCN reaction causing severe rash involving mucus membranes or skin necrosis: No Has patient had a PCN reaction that required hospitalization No Has patient had a PCN reaction occurring within the last 10 years: yes If all of the above answers are "NO", then may proceed with Cephalosporin use.   Eggs Or Egg-derived Products    Tur[place]-sweet Potatoes-peaches [  alitraq]    Aspirin Other (See Comments)   Affects the central nervous system in high doses . "shakey"   Chicken Allergy Nausea And Vomiting, Rash   Clarithromycin Nausea And Vomiting   Codeine Other (See Comments)   Hallucinations/bad dreams.   Entex Other (See Comments)   insomnia      Medication  List    STOP taking these medications   Citrucel oral powder Generic drug: methylcellulose     TAKE these medications   acetaminophen 500 MG tablet Commonly known as: TYLENOL Take 1,000 mg by mouth every 6 (six) hours as needed for moderate pain or headache.   Alaway 0.025 % ophthalmic solution Generic drug: ketotifen Place 1 drop into both eyes daily.   albuterol (2.5 MG/3ML) 0.083% nebulizer solution Commonly known as: PROVENTIL Take 2.5 mg by nebulization every 6 (six) hours as needed for wheezing or shortness of breath.   albuterol 90 MCG/ACT inhaler Commonly known as: PROVENTIL,VENTOLIN Inhale 2 puffs into the lungs every 6 (six) hours as needed for wheezing or shortness of breath.   azelastine 0.1 % nasal spray Commonly known as: ASTELIN 2 sprays by Nasal route 2 (two) times daily. Use in each nostril as directed   balsalazide 750 MG capsule Commonly known as: Colazal Take 3 capsules (2,250 mg total) by mouth 3 (three) times daily. What changed: See the new instructions.   buPROPion 150 MG 24 hr tablet Commonly known as: WELLBUTRIN XL Take 150 mg by mouth daily.   busPIRone 15 MG tablet Commonly known as: BUSPAR Take 7.5 mg by mouth 2 (two) times daily.   Calcium-Vitamin D 600-400 MG-UNIT Tabs Take 1 tablet by mouth 2 (two) times daily.   cetirizine 10 MG tablet Commonly known as: ZYRTEC Take 10 mg by mouth daily.   dicyclomine 10 MG capsule Commonly known as: BENTYL Take 1 capsule (10 mg total) by mouth 2 (two) times daily before a meal. What changed: See the new instructions.   DULoxetine 30 MG capsule Commonly known as: CYMBALTA Take 120 mg by mouth daily.   Eliquis 5 MG Tabs tablet Generic drug: apixaban Take 1 tablet (5 mg total) by mouth 2 (two) times daily.   esomeprazole 40 MG capsule Commonly known as: NEXIUM Take 1 capsule (40 mg total) by mouth 2 (two) times daily before a meal.   ferrous sulfate 325 (65 FE) MG tablet Take 325 mg by  mouth daily with breakfast.   fluticasone 50 MCG/ACT nasal spray Commonly known as: FLONASE Place 2 sprays into both nostrils daily.   Fluticasone-Salmeterol 500-50 MCG/DOSE Aepb Commonly known as: Advair Diskus Inhale one puff twice daily   FREESTYLE LITE test strip Generic drug: glucose blood   lidocaine 5 % Commonly known as: LIDODERM Place 1-2 patches onto the skin daily as needed (pain). Remove & Discard patch within 12 hours or as directed by MD   metoprolol tartrate 50 MG tablet Commonly known as: LOPRESSOR Take 1 tablet (50 mg total) by mouth 2 (two) times daily. What changed:   medication strength  See the new instructions.   Pepcid Complete 10-800-165 MG chewable tablet Generic drug: famotidine-calcium carbonate-magnesium hydroxide Chew 1 tablet by mouth daily as needed (for breakthrough GERD).   pravastatin 20 MG tablet Commonly known as: PRAVACHOL TAKE 1 TABLET DAILY   predniSONE 10 MG tablet Commonly known as: DELTASONE Prednisone 60 mg daily x 2 weeks followed by a taper of 50 mg x 1 week, 40 mg x 1 week, 30 mg  x 1 week, 20 mg x 1 week, 15 mg x 1 week, 10 mg x 1 week, 5 mg x 1 week, then stop prednisone.  Please give sufficient quantity of tablets   pregabalin 100 MG capsule Commonly known as: LYRICA Take 100 mg by mouth 2 (two) times daily.   Probiotic Caps Take 1 capsule by mouth daily.   traZODone 100 MG tablet Commonly known as: DESYREL Take 100 mg by mouth at bedtime.   vancomycin 50 mg/mL  oral solution Commonly known as: VANCOCIN Take 5 mL daily through 9/20, then 5 mL QOD for one week, then 5 mL every 3 days for one week.      Follow-up Information    Branch, Alphonse Guild, MD. Schedule an appointment as soon as possible for a visit in 2 month(s).   Specialty: Cardiology Contact information: [place] [address] [phone]        Jalene Mullet, PA-C. Schedule an appointment as soon as possible for a visit in  2 week(s).   Specialty: General Practice Contact information: [facility] [place] [address] [phone]        [place]. Schedule an appointment as soon as possible for a visit in 1 week(s).   Why: Hospital Follow Up  Contact information: [place] [phone]         Allergies  Allergen Reactions  . Mesalamine Other (See Comments)    Pancreatitis   . Cefprozil Other (See Comments)    Aggravates Ulcerative colitis   . Cefuroxime Axetil Other (See Comments)    Aggravates Ulcerative colitis   . Morphine And Related Hives and Itching  . Penicillins Hives    Has patient had a PCN reaction causing immediate rash, facial/tongue/throat swelling, SOB or lightheadedness with hypotension: No Has patient had a PCN reaction causing severe rash involving mucus membranes or skin necrosis: No Has patient had a PCN reaction that required hospitalization No Has patient had a PCN reaction occurring within the last 10 years: yes If all of the above answers are "NO", then may proceed with Cephalosporin use.   . Eggs Or Egg-Derived Products   . Turkey-Sweet Potatoes-Peaches [Alitraq]   . Aspirin Other (See Comments)    Affects the central nervous system in high doses . "shakey"  . Chicken Allergy Nausea And Vomiting and Rash  . Clarithromycin Nausea And Vomiting  . Codeine Other (See Comments)    Hallucinations/bad dreams.  . Entex Other (See Comments)    insomnia   Allergies as of 06/15/2019      Reactions   Mesalamine Other (See Comments)   Pancreatitis    Cefprozil Other (See Comments)   Aggravates Ulcerative colitis    Cefuroxime Axetil Other (See Comments)   Aggravates Ulcerative colitis    Morphine And Related Hives, Itching   Penicillins Hives   Has patient had a PCN reaction causing immediate rash, facial/tongue/throat swelling, SOB or lightheadedness with hypotension: No Has  patient had a PCN reaction causing severe rash involving mucus membranes or skin necrosis: No Has patient had a PCN reaction that required hospitalization No Has patient had a PCN reaction occurring within the last 10 years: yes If all of the above answers are "NO", then may proceed with Cephalosporin use.   Eggs Or Egg-derived Products    Turkey-sweet Potatoes-peaches [alitraq]    Aspirin Other (See Comments)   Affects the central nervous  system in high doses . "shakey"   Chicken Allergy Nausea And Vomiting, Rash   Clarithromycin Nausea And Vomiting   Codeine Other (See Comments)   Hallucinations/bad dreams.   Entex Other (See Comments)   insomnia      Medication List    STOP taking these medications   Citrucel oral powder Generic drug: methylcellulose     TAKE these medications   acetaminophen 500 MG tablet Commonly known as: TYLENOL Take 1,000 mg by mouth every 6 (six) hours as needed for moderate pain or headache.   Alaway 0.025 % ophthalmic solution Generic drug: ketotifen Place 1 drop into both eyes daily.   albuterol (2.5 MG/3ML) 0.083% nebulizer solution Commonly known as: PROVENTIL Take 2.5 mg by nebulization every 6 (six) hours as needed for wheezing or shortness of breath.   albuterol 90 MCG/ACT inhaler Commonly known as: PROVENTIL,VENTOLIN Inhale 2 puffs into the lungs every 6 (six) hours as needed for wheezing or shortness of breath.   azelastine 0.1 % nasal spray Commonly known as: ASTELIN 2 sprays by Nasal route 2 (two) times daily. Use in each nostril as directed   balsalazide 750 MG capsule Commonly known as: Colazal Take 3 capsules (2,250 mg total) by mouth 3 (three) times daily. What changed: See the new instructions.   buPROPion 150 MG 24 hr tablet Commonly known as: WELLBUTRIN XL Take 150 mg by mouth daily.   busPIRone 15 MG tablet Commonly known as: BUSPAR Take 7.5 mg by mouth 2 (two) times daily.   Calcium-Vitamin D 600-400 MG-UNIT  Tabs Take 1 tablet by mouth 2 (two) times daily.   cetirizine 10 MG tablet Commonly known as: ZYRTEC Take 10 mg by mouth daily.   dicyclomine 10 MG capsule Commonly known as: BENTYL Take 1 capsule (10 mg total) by mouth 2 (two) times daily before a meal. What changed: See the new instructions.   DULoxetine 30 MG capsule Commonly known as: CYMBALTA Take 120 mg by mouth daily.   Eliquis 5 MG Tabs tablet Generic drug: apixaban Take 1 tablet (5 mg total) by mouth 2 (two) times daily.   esomeprazole 40 MG capsule Commonly known as: NEXIUM Take 1 capsule (40 mg total) by mouth 2 (two) times daily before a meal.   ferrous sulfate 325 (65 FE) MG tablet Take 325 mg by mouth daily with breakfast.   fluticasone 50 MCG/ACT nasal spray Commonly known as: FLONASE Place 2 sprays into both nostrils daily.   Fluticasone-Salmeterol 500-50 MCG/DOSE Aepb Commonly known as: Advair Diskus Inhale one puff twice daily   FREESTYLE LITE test strip Generic drug: glucose blood   lidocaine 5 % Commonly known as: LIDODERM Place 1-2 patches onto the skin daily as needed (pain). Remove & Discard patch within 12 hours or as directed by MD   metoprolol tartrate 50 MG tablet Commonly known as: LOPRESSOR Take 1 tablet (50 mg total) by mouth 2 (two) times daily. What changed:   medication strength  See the new instructions.   Pepcid Complete 10-800-165 MG chewable tablet Generic drug: famotidine-calcium carbonate-magnesium hydroxide Chew 1 tablet by mouth daily as needed (for breakthrough GERD).   pravastatin 20 MG tablet Commonly known as: PRAVACHOL TAKE 1 TABLET DAILY   predniSONE 10 MG tablet Commonly known as: DELTASONE Prednisone 60 mg daily x 2 weeks followed by a taper of 50 mg x 1 week, 40 mg x 1 week, 30 mg x 1 week, 20 mg x 1 week, 15 mg x 1 week, 10 mg  x 1 week, 5 mg x 1 week, then stop prednisone.  Please give sufficient quantity of tablets   pregabalin 100 MG capsule Commonly  known as: LYRICA Take 100 mg by mouth 2 (two) times daily.   Probiotic Caps Take 1 capsule by mouth daily.   traZODone 100 MG tablet Commonly known as: DESYREL Take 100 mg by mouth at bedtime.   vancomycin 50 mg/mL  oral solution Commonly known as: VANCOCIN Take 5 mL daily through 9/20, then 5 mL QOD for one week, then 5 mL every 3 days for one week.       Procedures/Studies: Dg Chest 2 View  Result Date: 05/20/2019 CLINICAL DATA:  Abdominal cramping with nausea fever. EXAM: CHEST - 2 VIEW COMPARISON:  02/26/2018 FINDINGS: Cardiopericardial silhouette is at upper limits of normal for size. Hiatal hernia noted. The lungs are clear without focal pneumonia, edema, pneumothorax or pleural effusion. Interstitial markings are diffusely coarsened with chronic features. The visualized bony structures of the thorax are intact. IMPRESSION: 1. No acute cardiopulmonary findings 2. Hiatal hernia. Electronically Signed   By: Misty Stanley M.D.   On: 05/20/2019 19:10   Ct Abdomen Pelvis W Contrast  Result Date: 05/20/2019 CLINICAL DATA:  Abdominal pain with diarrhea. EXAM: CT ABDOMEN AND PELVIS WITH CONTRAST TECHNIQUE: Multidetector CT imaging of the abdomen and pelvis was performed using the standard protocol following bolus administration of intravenous contrast. CONTRAST:  166m OMNIPAQUE IOHEXOL 300 MG/ML  SOLN COMPARISON:  05/01/2016 FINDINGS: Lower chest:  Large hiatal hernia. Hepatobiliary: No suspicious focal abnormality within the liver parenchyma. Gallbladder is surgically absent. No intrahepatic or extrahepatic biliary dilation. Pancreas: No focal mass lesion. No dilatation of the main duct. No intraparenchymal cyst. No peripancreatic edema. Spleen: No splenomegaly. No focal mass lesion. Adrenals/Urinary Tract: No adrenal nodule or mass. 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 Right kidney unremarkable. The urinary bladder appears normal  for the degree of distention. Stomach/Bowel: Large hiatal hernia with approximately 75-80% of the stomach in the chest. Duodenum is normally positioned as is the ligament of Treitz. No small bowel wall thickening. No small bowel dilatation. Cecum is positioned in the anterior midline with unremarkable terminal ileum The appendix is not visualized, but there is no edema or inflammation in the region of the cecum. There is diffuse mild colonic wall thickening, most prominent in the sigmoid segment but extending from the ascending colon to the level of the rectum. This is associated with pericolonic edema/inflammation. Diverticular changes are noted in the left colon. Vascular/Lymphatic: There is abdominal aortic atherosclerosis without aneurysm. Celiac axis, SMA, and IMA are opacified. Portal vein and superior mesenteric vein are patent. There is no gastrohepatic or hepatoduodenal ligament lymphadenopathy. No intraperitoneal or retroperitoneal lymphadenopathy. No pelvic sidewall lymphadenopathy. Reproductive: The uterus is unremarkable.  There is no adnexal mass. Other: No intraperitoneal free fluid. Musculoskeletal: No worrisome lytic or sclerotic osseous abnormality. IMPRESSION: 1. Diffuse mild colonic wall thickening with pericolonic edema/inflammation. Imaging features are compatible with an infectious/inflammatory colitis. 2. Large hiatal hernia. 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 4. Abdominal aortic atherosclerosis. Aortic Atherosclerosis (ICD10-I70.0). Electronically Signed   By: EMisty StanleyM.D.   On: 05/20/2019 19:06   Dg Chest Port 1 View  Result Date: 06/12/2019 CLINICAL DATA:  Productive cough. EXAM: PORTABLE CHEST 1 VIEW COMPARISON:  05/20/2019 FINDINGS: Lordotic technique is demonstrated. Lungs are adequately inflated without consolidation or effusion. Borderline stable  cardiomegaly. Large hiatal hernia. Remainder of the exam is  unchanged. IMPRESSION: No acute cardiopulmonary disease. Large hiatal hernia. Electronically Signed   By: Marin Olp M.D.   On: 06/12/2019 11:06   [place] Ekg Site Rite  Result Date: 06/05/2019 If Site Rite image not attached, placement could not be confirmed due to current cardiac rhythm.     Subjective: Pt says she is feeling well enough to manage at home.  She would like to DC home today.    Discharge Exam: Vitals:   06/15/19 0514 06/15/19 0824  BP: 114/69   Pulse: 81   Resp: 20   Temp:    SpO2: 95% 92%   Vitals:   06/14/19 2135 06/15/19 0433 06/15/19 0514 06/15/19 0824  BP: (!) 117/94  114/69   Pulse: (!) 101  81   Resp: 16  20   Temp: 99.4 F (37.4 C)     TempSrc: Oral     SpO2: 95%  95% 92%  Weight:  89.5 kg    Height:       General: Pt is alert, awake, not in acute distress Cardiovascular: normal S1/S2 +, no rubs, no gallops Respiratory: CTA bilaterally, no wheezing, no rhonchi Abdominal: Soft, NT, ND, bowel sounds + Extremities: no edema, no cyanosis   The results of significant diagnostics from this hospitalization (including imaging, microbiology, ancillary and laboratory) are listed below for reference.     Microbiology: Recent Results (from the past 240 hour(s))  C difficile quick scan w PCR reflex     Status: None   Collection Time: 06/10/19  6:25 PM   Specimen: STOOL  Result Value Ref Range Status   C Diff antigen NEGATIVE NEGATIVE Final   C Diff toxin NEGATIVE NEGATIVE Final   C Diff interpretation No C. difficile detected.  Final    Comment: Performed at [facility], [address], [place], [place] [address]     Labs: BNP (last 3 results) No results for input(s): BNP in the last 8760 hours. Basic Metabolic Panel: Recent Labs  Lab 06/09/19 0515  06/11/19 0627 06/12/19 0607 06/13/19 0630 06/14/19 0544 06/15/19 0552  NA 138   < > 138 137 138 137 138  K 3.9   < > 3.8 4.6 3.8 3.8 3.8  CL 105   < > 107 106 106 105 103  CO2 27   < > 25 25 26  28 28   GLUCOSE 106*   < > 105* 191* 189* 195* 162*  BUN <5*   < > 5* 5* 6* 8 11  CREATININE 0.78   < > 0.92 0.78 0.81 0.76 0.79  CALCIUM 8.5*   < > 8.3* 8.5* 8.5* 8.5* 8.7*  MG 2.1  --   --  1.8  --  2.0  --    < > = values in this interval not displayed.   Liver Function Tests: No results for input(s): AST, ALT, ALKPHOS, BILITOT, PROT, ALBUMIN in the last 168 hours. No results for input(s): LIPASE, AMYLASE in the last 168 hours. No results for input(s): AMMONIA in the last 168 hours. CBC: Recent Labs  Lab 06/12/19 0607 06/13/19 0630  WBC 8.7 9.9  HGB 11.2* 11.3*  HCT 35.9* 36.2  MCV 95.2 95.0  PLT 344 375   Cardiac Enzymes: No results for input(s): CKTOTAL, CKMB, CKMBINDEX, TROPONINI in the last 168 hours. BNP: Invalid input(s): POCBNP CBG: Recent Labs  Lab 06/14/19 0733 06/14/19 1138 06/14/19 1639 06/14/19 2135 06/15/19 0746  GLUCAP 181*  248* 185* 317* 137*   D-Dimer No results for input(s): DDIMER in the last 72 hours. Hgb A1c No results for input(s): HGBA1C in the last 72 hours. Lipid Profile No results for input(s): CHOL, HDL, LDLCALC, TRIG, CHOLHDL, LDLDIRECT in the last 72 hours. Thyroid function studies No results for input(s): TSH, T4TOTAL, T3FREE, THYROIDAB in the last 72 hours.  Invalid input(s): FREET3 Anemia work up No results for input(s): VITAMINB12, FOLATE, FERRITIN, TIBC, IRON, RETICCTPCT in the last 72 hours. Urinalysis    Component Value Date/Time   COLORURINE YELLOW 06/05/2019 1514   APPEARANCEUR CLEAR 06/05/2019 1514   LABSPEC 1.004 (L) 06/05/2019 1514   PHURINE 7.0 06/05/2019 1514   GLUCOSEU NEGATIVE 06/05/2019 1514   HGBUR MODERATE (A) 06/05/2019 1514   BILIRUBINUR NEGATIVE 06/05/2019 1514   KETONESUR NEGATIVE 06/05/2019 1514   PROTEINUR NEGATIVE 06/05/2019 1514   NITRITE NEGATIVE 06/05/2019 1514   LEUKOCYTESUR TRACE (A) 06/05/2019 1514   Sepsis Labs Invalid input(s): PROCALCITONIN,  WBC,  LACTICIDVEN Microbiology Recent  Results (from the past 240 hour(s))  C difficile quick scan w PCR reflex     Status: None   Collection Time: 06/10/19  6:25 PM   Specimen: STOOL  Result Value Ref Range Status   C Diff antigen NEGATIVE NEGATIVE Final   C Diff toxin NEGATIVE NEGATIVE Final   C Diff interpretation No C. difficile detected.  Final    Comment: Performed at [facility], [address] [place], [place] [address]   Time coordinating discharge: 44 minutes   SIGNED:  Irwin Brakeman, MD  [facility] 06/15/2019, 11:11 AM How to contact the [facility] Attending or Consulting provider [place] or covering provider during after hours [place], for this patient?  1. Check the care team in [facility] and look for a) attending/consulting [facility] provider listed and b) the [facility] team listed 2. Log into [email or web address] and use [facility]'s universal password to access. If you do not have the password, please contact the hospital operator. 3. Locate the [facility] provider you are looking for under [facility] and page to a number that you can be directly reached. 4. If you still have difficulty reaching the provider, please page the [facility] (Director on Call) for the Hospitalists listed on amion for assistance.

## 2019-06-27 DIAGNOSIS — J309 Allergic rhinitis, unspecified: Secondary | ICD-10-CM | POA: Diagnosis not present

## 2019-06-29 DIAGNOSIS — I10 Essential (primary) hypertension: Secondary | ICD-10-CM | POA: Diagnosis not present

## 2019-06-29 DIAGNOSIS — E785 Hyperlipidemia, unspecified: Secondary | ICD-10-CM | POA: Diagnosis not present

## 2019-07-12 DIAGNOSIS — A0472 Enterocolitis due to Clostridium difficile, not specified as recurrent: Secondary | ICD-10-CM | POA: Diagnosis not present

## 2019-07-12 DIAGNOSIS — K5190 Ulcerative colitis, unspecified, without complications: Secondary | ICD-10-CM | POA: Diagnosis not present

## 2019-07-12 DIAGNOSIS — Z6832 Body mass index (BMI) 32.0-32.9, adult: Secondary | ICD-10-CM | POA: Diagnosis not present

## 2019-07-13 ENCOUNTER — Inpatient Hospital Stay (HOSPITAL_COMMUNITY)
Admission: EM | Admit: 2019-07-13 | Discharge: 2019-07-21 | DRG: 372 | Disposition: A | Discharge status: Skilled Nursing Facility | Payer: Medicare Other | Attending: Family Medicine | Admitting: Family Medicine

## 2019-07-13 ENCOUNTER — Other Ambulatory Visit: Payer: Self-pay

## 2019-07-13 ENCOUNTER — Emergency Department (HOSPITAL_COMMUNITY): Payer: Medicare Other

## 2019-07-13 ENCOUNTER — Encounter (HOSPITAL_COMMUNITY): Payer: Self-pay | Admitting: Emergency Medicine

## 2019-07-13 DIAGNOSIS — Z888 Allergy status to other drugs, medicaments and biological substances status: Secondary | ICD-10-CM

## 2019-07-13 DIAGNOSIS — R197 Diarrhea, unspecified: Secondary | ICD-10-CM | POA: Diagnosis not present

## 2019-07-13 DIAGNOSIS — Z886 Allergy status to analgesic agent status: Secondary | ICD-10-CM

## 2019-07-13 DIAGNOSIS — Z880 Allergy status to penicillin: Secondary | ICD-10-CM

## 2019-07-13 DIAGNOSIS — A09 Infectious gastroenteritis and colitis, unspecified: Secondary | ICD-10-CM | POA: Diagnosis not present

## 2019-07-13 DIAGNOSIS — Z8701 Personal history of pneumonia (recurrent): Secondary | ICD-10-CM

## 2019-07-13 DIAGNOSIS — E785 Hyperlipidemia, unspecified: Secondary | ICD-10-CM | POA: Diagnosis present

## 2019-07-13 DIAGNOSIS — F329 Major depressive disorder, single episode, unspecified: Secondary | ICD-10-CM | POA: Diagnosis present

## 2019-07-13 DIAGNOSIS — K5100 Ulcerative (chronic) pancolitis without complications: Secondary | ICD-10-CM | POA: Diagnosis not present

## 2019-07-13 DIAGNOSIS — J449 Chronic obstructive pulmonary disease, unspecified: Secondary | ICD-10-CM | POA: Diagnosis present

## 2019-07-13 DIAGNOSIS — Z86711 Personal history of pulmonary embolism: Secondary | ICD-10-CM

## 2019-07-13 DIAGNOSIS — Z87891 Personal history of nicotine dependence: Secondary | ICD-10-CM

## 2019-07-13 DIAGNOSIS — G8929 Other chronic pain: Secondary | ICD-10-CM | POA: Diagnosis present

## 2019-07-13 DIAGNOSIS — Z20828 Contact with and (suspected) exposure to other viral communicable diseases: Secondary | ICD-10-CM | POA: Diagnosis not present

## 2019-07-13 DIAGNOSIS — Z96651 Presence of right artificial knee joint: Secondary | ICD-10-CM | POA: Diagnosis present

## 2019-07-13 DIAGNOSIS — Z8249 Family history of ischemic heart disease and other diseases of the circulatory system: Secondary | ICD-10-CM

## 2019-07-13 DIAGNOSIS — H9190 Unspecified hearing loss, unspecified ear: Secondary | ICD-10-CM | POA: Diagnosis present

## 2019-07-13 DIAGNOSIS — E11649 Type 2 diabetes mellitus with hypoglycemia without coma: Secondary | ICD-10-CM | POA: Diagnosis not present

## 2019-07-13 DIAGNOSIS — Z8719 Personal history of other diseases of the digestive system: Secondary | ICD-10-CM

## 2019-07-13 DIAGNOSIS — R110 Nausea: Secondary | ICD-10-CM

## 2019-07-13 DIAGNOSIS — K529 Noninfective gastroenteritis and colitis, unspecified: Secondary | ICD-10-CM | POA: Diagnosis not present

## 2019-07-13 DIAGNOSIS — G4730 Sleep apnea, unspecified: Secondary | ICD-10-CM | POA: Diagnosis present

## 2019-07-13 DIAGNOSIS — E860 Dehydration: Secondary | ICD-10-CM | POA: Diagnosis present

## 2019-07-13 DIAGNOSIS — I480 Paroxysmal atrial fibrillation: Secondary | ICD-10-CM | POA: Diagnosis present

## 2019-07-13 DIAGNOSIS — E876 Hypokalemia: Secondary | ICD-10-CM | POA: Diagnosis present

## 2019-07-13 DIAGNOSIS — A0471 Enterocolitis due to Clostridium difficile, recurrent: Secondary | ICD-10-CM | POA: Diagnosis not present

## 2019-07-13 DIAGNOSIS — K5730 Diverticulosis of large intestine without perforation or abscess without bleeding: Secondary | ICD-10-CM | POA: Diagnosis not present

## 2019-07-13 DIAGNOSIS — K219 Gastro-esophageal reflux disease without esophagitis: Secondary | ICD-10-CM | POA: Diagnosis not present

## 2019-07-13 DIAGNOSIS — F419 Anxiety disorder, unspecified: Secondary | ICD-10-CM | POA: Diagnosis present

## 2019-07-13 DIAGNOSIS — Z7901 Long term (current) use of anticoagulants: Secondary | ICD-10-CM

## 2019-07-13 DIAGNOSIS — Z8541 Personal history of malignant neoplasm of cervix uteri: Secondary | ICD-10-CM

## 2019-07-13 DIAGNOSIS — K5190 Ulcerative colitis, unspecified, without complications: Secondary | ICD-10-CM | POA: Diagnosis present

## 2019-07-13 DIAGNOSIS — Z8619 Personal history of other infectious and parasitic diseases: Secondary | ICD-10-CM

## 2019-07-13 DIAGNOSIS — K5150 Left sided colitis without complications: Secondary | ICD-10-CM | POA: Diagnosis present

## 2019-07-13 DIAGNOSIS — Z79899 Other long term (current) drug therapy: Secondary | ICD-10-CM

## 2019-07-13 DIAGNOSIS — D649 Anemia, unspecified: Secondary | ICD-10-CM | POA: Diagnosis present

## 2019-07-13 DIAGNOSIS — Z881 Allergy status to other antibiotic agents status: Secondary | ICD-10-CM

## 2019-07-13 DIAGNOSIS — E1165 Type 2 diabetes mellitus with hyperglycemia: Secondary | ICD-10-CM | POA: Diagnosis present

## 2019-07-13 DIAGNOSIS — Z9181 History of falling: Secondary | ICD-10-CM

## 2019-07-13 DIAGNOSIS — Z885 Allergy status to narcotic agent status: Secondary | ICD-10-CM

## 2019-07-13 DIAGNOSIS — M1990 Unspecified osteoarthritis, unspecified site: Secondary | ICD-10-CM | POA: Diagnosis present

## 2019-07-13 LAB — CBC WITH DIFFERENTIAL/PLATELET
Abs Immature Granulocytes: 0.16 10*3/uL — ABNORMAL HIGH (ref 0.00–0.07)
Basophils Absolute: 0.1 10*3/uL (ref 0.0–0.1)
Basophils Relative: 1 %
Eosinophils Absolute: 0.1 10*3/uL (ref 0.0–0.5)
Eosinophils Relative: 1 %
HCT: 40.6 % (ref 36.0–46.0)
Hemoglobin: 12.8 g/dL (ref 12.0–15.0)
Immature Granulocytes: 1 %
Lymphocytes Relative: 27 %
Lymphs Abs: 3.3 10*3/uL (ref 0.7–4.0)
MCH: 29.4 pg (ref 26.0–34.0)
MCHC: 31.5 g/dL (ref 30.0–36.0)
MCV: 93.1 fL (ref 80.0–100.0)
Monocytes Absolute: 1.1 10*3/uL — ABNORMAL HIGH (ref 0.1–1.0)
Monocytes Relative: 9 %
Neutro Abs: 7.3 10*3/uL (ref 1.7–7.7)
Neutrophils Relative %: 61 %
Platelets: 138 10*3/uL — ABNORMAL LOW (ref 150–400)
RBC: 4.36 MIL/uL (ref 3.87–5.11)
RDW: 16 % — ABNORMAL HIGH (ref 11.5–15.5)
WBC: 12 10*3/uL — ABNORMAL HIGH (ref 4.0–10.5)
nRBC: 0 % (ref 0.0–0.2)

## 2019-07-13 LAB — COMPREHENSIVE METABOLIC PANEL
ALT: 12 U/L (ref 0–44)
AST: 8 U/L — ABNORMAL LOW (ref 15–41)
Albumin: 3.2 g/dL — ABNORMAL LOW (ref 3.5–5.0)
Alkaline Phosphatase: 60 U/L (ref 38–126)
Anion gap: 14 (ref 5–15)
BUN: 9 mg/dL (ref 8–23)
CO2: 24 mmol/L (ref 22–32)
Calcium: 9.4 mg/dL (ref 8.9–10.3)
Chloride: 96 mmol/L — ABNORMAL LOW (ref 98–111)
Creatinine, Ser: 0.79 mg/dL (ref 0.44–1.00)
GFR calc Af Amer: >60 mL/min (ref >60)
GFR calc non Af Amer: >60 mL/min (ref >60)
Glucose, Bld: 369 mg/dL — ABNORMAL HIGH (ref 70–99)
Potassium: 3.2 mmol/L — ABNORMAL LOW (ref 3.5–5.1)
Sodium: 134 mmol/L — ABNORMAL LOW (ref 135–145)
Total Bilirubin: 0.9 mg/dL (ref 0.3–1.2)
Total Protein: 5.9 g/dL — ABNORMAL LOW (ref 6.5–8.1)

## 2019-07-13 LAB — URINALYSIS, ROUTINE W REFLEX MICROSCOPIC
Bacteria, UA: NONE SEEN
Bilirubin Urine: NEGATIVE
Glucose, UA: >=500 mg/dL — AB
Hgb urine dipstick: NEGATIVE
Ketones, ur: 20 mg/dL — AB
Nitrite: NEGATIVE
Protein, ur: NEGATIVE mg/dL
Specific Gravity, Urine: 1.029 (ref 1.005–1.030)
pH: 6 (ref 5.0–8.0)

## 2019-07-13 LAB — PROTIME-INR
INR: 1 (ref 0.8–1.2)
Prothrombin Time: 13.1 seconds (ref 11.4–15.2)

## 2019-07-13 LAB — CBG MONITORING, ED: Glucose-Capillary: 160 mg/dL — ABNORMAL HIGH (ref 70–99)

## 2019-07-13 MED ORDER — CALCIUM CARBONATE-VITAMIN D 500-200 MG-UNIT PO TABS
1.0000 | ORAL_TABLET | Freq: Two times a day (BID) | ORAL | Status: DC
  Administered 2019-07-13 – 2019-07-21 (×16): 1 via ORAL
  Filled 2019-07-13 (×16): qty 1

## 2019-07-13 MED ORDER — IOHEXOL 300 MG/ML  SOLN
100.0000 mL | Freq: Once | INTRAMUSCULAR | Status: AC | PRN
  Administered 2019-07-13: 16:00:00 100 mL via INTRAVENOUS

## 2019-07-13 MED ORDER — PREGABALIN 50 MG PO CAPS
100.0000 mg | ORAL_CAPSULE | Freq: Two times a day (BID) | ORAL | Status: DC
  Administered 2019-07-13 – 2019-07-21 (×15): 100 mg via ORAL
  Filled 2019-07-13: qty 2
  Filled 2019-07-13: qty 1
  Filled 2019-07-13: qty 4
  Filled 2019-07-13 (×5): qty 2
  Filled 2019-07-13: qty 1
  Filled 2019-07-13 (×2): qty 2
  Filled 2019-07-13 (×3): qty 1
  Filled 2019-07-13: qty 4
  Filled 2019-07-13: qty 2

## 2019-07-13 MED ORDER — TRAZODONE HCL 50 MG PO TABS
100.0000 mg | ORAL_TABLET | Freq: Every day | ORAL | Status: DC
  Administered 2019-07-13 – 2019-07-20 (×8): 100 mg via ORAL
  Filled 2019-07-13 (×8): qty 2

## 2019-07-13 MED ORDER — VANCOMYCIN 50 MG/ML ORAL SOLUTION: 125.0000 mg | Freq: Four times a day (QID) | ORAL | Status: DC

## 2019-07-13 MED ORDER — DILTIAZEM LOAD VIA INFUSION
15.0000 mg | Freq: Once | INTRAVENOUS | Status: AC
  Administered 2019-07-13: 15 mg via INTRAVENOUS
  Filled 2019-07-13: qty 15

## 2019-07-13 MED ORDER — VANCOMYCIN 50 MG/ML ORAL SOLUTION
125.0000 mg | Freq: Four times a day (QID) | ORAL | Status: DC
  Administered 2019-07-13 – 2019-07-21 (×32): 125 mg via ORAL
  Filled 2019-07-13 (×42): qty 2.5

## 2019-07-13 MED ORDER — PROMETHAZINE HCL 25 MG/ML IJ SOLN
12.5000 mg | Freq: Once | INTRAMUSCULAR | Status: AC
  Administered 2019-07-13: 18:00:00 12.5 mg via INTRAVENOUS
  Filled 2019-07-13: qty 1

## 2019-07-13 MED ORDER — RISAQUAD PO CAPS
1.0000 | ORAL_CAPSULE | Freq: Every day | ORAL | Status: DC
  Administered 2019-07-13 – 2019-07-21 (×9): 1 via ORAL
  Filled 2019-07-13 (×9): qty 1

## 2019-07-13 MED ORDER — ACETAMINOPHEN 500 MG PO TABS: 1000.0000 mg | ORAL_TABLET | Freq: Once | ORAL | Status: DC

## 2019-07-13 MED ORDER — INSULIN ASPART 100 UNIT/ML ~~LOC~~ SOLN
10.0000 [IU] | Freq: Once | SUBCUTANEOUS | Status: AC
  Administered 2019-07-13: 10 [IU] via SUBCUTANEOUS
  Filled 2019-07-13: qty 1

## 2019-07-13 MED ORDER — FENTANYL CITRATE (PF) 100 MCG/2ML IJ SOLN
50.0000 ug | Freq: Once | INTRAMUSCULAR | Status: AC
  Administered 2019-07-13: 18:00:00 50 ug via INTRAVENOUS
  Filled 2019-07-13: qty 2

## 2019-07-13 MED ORDER — METOPROLOL TARTRATE 50 MG PO TABS
50.0000 mg | ORAL_TABLET | Freq: Two times a day (BID) | ORAL | Status: DC
  Administered 2019-07-13 – 2019-07-14 (×2): 50 mg via ORAL
  Filled 2019-07-13 (×2): qty 1

## 2019-07-13 MED ORDER — BALSALAZIDE DISODIUM 750 MG PO CAPS
2250.0000 mg | ORAL_CAPSULE | Freq: Three times a day (TID) | ORAL | Status: DC
  Administered 2019-07-14 – 2019-07-21 (×20): 2250 mg via ORAL
  Filled 2019-07-13: qty 3

## 2019-07-13 MED ORDER — CHLORHEXIDINE GLUCONATE CLOTH 2 % EX PADS
6.0000 | MEDICATED_PAD | Freq: Every day | CUTANEOUS | Status: DC
  Administered 2019-07-13 – 2019-07-15 (×3): 6 via TOPICAL

## 2019-07-13 MED ORDER — SODIUM CHLORIDE 0.9 % IV BOLUS
500.0000 mL | Freq: Once | INTRAVENOUS | Status: AC
  Administered 2019-07-13: 18:00:00 500 mL via INTRAVENOUS

## 2019-07-13 MED ORDER — APIXABAN 5 MG PO TABS
5.0000 mg | ORAL_TABLET | Freq: Two times a day (BID) | ORAL | Status: DC
  Administered 2019-07-13 – 2019-07-21 (×16): 5 mg via ORAL
  Filled 2019-07-13 (×16): qty 1

## 2019-07-13 MED ORDER — PREDNISONE 20 MG PO TABS
40.0000 mg | ORAL_TABLET | Freq: Every day | ORAL | Status: DC
  Administered 2019-07-13 – 2019-07-21 (×9): 40 mg via ORAL
  Filled 2019-07-13 (×9): qty 2

## 2019-07-13 MED ORDER — PRAVASTATIN SODIUM 10 MG PO TABS
20.0000 mg | ORAL_TABLET | Freq: Every day | ORAL | Status: DC
  Administered 2019-07-13 – 2019-07-21 (×9): 20 mg via ORAL
  Filled 2019-07-13 (×9): qty 2

## 2019-07-13 MED ORDER — AZELASTINE HCL 0.1 % NA SOLN
2.0000 | Freq: Two times a day (BID) | NASAL | Status: DC
  Administered 2019-07-13 – 2019-07-21 (×16): 2 via NASAL
  Filled 2019-07-13 (×3): qty 30

## 2019-07-13 MED ORDER — DICYCLOMINE HCL 10 MG PO CAPS
10.0000 mg | ORAL_CAPSULE | Freq: Two times a day (BID) | ORAL | Status: DC
  Administered 2019-07-14 – 2019-07-21 (×15): 10 mg via ORAL
  Filled 2019-07-13 (×15): qty 1

## 2019-07-13 MED ORDER — MOMETASONE FURO-FORMOTEROL FUM 200-5 MCG/ACT IN AERO
2.0000 | INHALATION_SPRAY | Freq: Two times a day (BID) | RESPIRATORY_TRACT | Status: DC
  Administered 2019-07-14 – 2019-07-21 (×14): 2 via RESPIRATORY_TRACT
  Filled 2019-07-13 (×3): qty 8.8

## 2019-07-13 MED ORDER — ONDANSETRON HCL 4 MG/2ML IJ SOLN
4.0000 mg | Freq: Once | INTRAMUSCULAR | Status: AC
  Administered 2019-07-13: 4 mg via INTRAVENOUS
  Filled 2019-07-13: qty 2

## 2019-07-13 MED ORDER — ALBUTEROL SULFATE (2.5 MG/3ML) 0.083% IN NEBU: 2.5000 mg | INHALATION_SOLUTION | Freq: Four times a day (QID) | RESPIRATORY_TRACT | Status: DC | PRN

## 2019-07-13 MED ORDER — POTASSIUM CHLORIDE IN NACL 20-0.9 MEQ/L-% IV SOLN
INTRAVENOUS | Status: DC
  Administered 2019-07-13 – 2019-07-14 (×2): via INTRAVENOUS

## 2019-07-13 MED ORDER — VANCOMYCIN 50 MG/ML ORAL SOLUTION: 125.0000 mg | Freq: Every day | ORAL | Status: DC

## 2019-07-13 MED ORDER — POTASSIUM CHLORIDE CRYS ER 20 MEQ PO TBCR
40.0000 meq | EXTENDED_RELEASE_TABLET | Freq: Once | ORAL | Status: AC
  Administered 2019-07-13: 40 meq via ORAL
  Filled 2019-07-13: qty 2

## 2019-07-13 MED ORDER — VANCOMYCIN 50 MG/ML ORAL SOLUTION: 125.0000 mg | ORAL | Status: DC

## 2019-07-13 MED ORDER — DILTIAZEM HCL 25 MG/5ML IV SOLN
10.0000 mg | Freq: Once | INTRAVENOUS | Status: AC
  Administered 2019-07-13: 10 mg via INTRAVENOUS
  Filled 2019-07-13: qty 5

## 2019-07-13 MED ORDER — DILTIAZEM HCL-DEXTROSE 125-5 MG/125ML-% IV SOLN (PREMIX)
5.0000 mg/h | INTRAVENOUS | Status: DC
  Administered 2019-07-13: 5 mg/h via INTRAVENOUS
  Filled 2019-07-13: qty 125

## 2019-07-13 MED ORDER — ACETAMINOPHEN 325 MG PO TABS
650.0000 mg | ORAL_TABLET | Freq: Four times a day (QID) | ORAL | Status: DC | PRN
  Administered 2019-07-13 – 2019-07-20 (×2): 650 mg via ORAL
  Filled 2019-07-13 (×2): qty 2

## 2019-07-13 MED ORDER — VANCOMYCIN 50 MG/ML ORAL SOLUTION: 125.0000 mg | Freq: Two times a day (BID) | ORAL | Status: DC

## 2019-07-13 MED ORDER — KETOTIFEN FUMARATE 0.025 % OP SOLN
1.0000 [drp] | Freq: Every day | OPHTHALMIC | Status: DC
  Administered 2019-07-14 – 2019-07-21 (×8): 1 [drp] via OPHTHALMIC
  Filled 2019-07-13 (×2): qty 5

## 2019-07-13 MED ORDER — SODIUM CHLORIDE 0.9 % IV BOLUS
500.0000 mL | Freq: Once | INTRAVENOUS | Status: AC
  Administered 2019-07-13: 15:00:00 500 mL via INTRAVENOUS

## 2019-07-13 MED ORDER — ACETAMINOPHEN 650 MG RE SUPP: 650.0000 mg | Freq: Four times a day (QID) | RECTAL | Status: DC | PRN

## 2019-07-13 NOTE — ED Notes (Signed)
Family at bedside. 

## 2019-07-13 NOTE — H&P (Signed)
TRH H&P   Patient Demographics:    [name], is a 68 y.o. female  MRN: [id number]   DOB - 05/25/1951  Admit Date - 07/13/2019  Outpatient Primary MD for the patient is Jalene Mullet, PA-C  Referring MD/NP/PA: PA [place]  Outpatient Specialists: GI Dr Daiva Nakayama  Patient coming from: Home  Chief Complaint  Patient presents with  . Diarrhea      HPI:    [name]  is a 68 y.o. female, with past medical history for A. fib, on Eliquis, chronic back pain, COPD, on room air, DDD, ulcerative colitis, C. difficile colitis, with recent prolonged hospitalization secondary to colitis related to ulcerative colitis and C. difficile colitis, she was discharged on prednisone taper, and vancomycin taper, patient/her husband reports she has been compliant with her regimen, patient reports for last 3 days, she has been having worsening diarrhea, 6-10 times a day, so far she had 6 episodes for today, as well she is having persistent nausea, but no vomiting, no coffee-ground emesis, as well she has been having poor appetite, abdominal pain, crampy, nonradiating, reports some minimal amount of blood in her stool, but report is always been like that, no NSAIDs use, no alcohol use, she is on Eliquis for A. fib, no fever, no chills, no cough, no shortness of breath, no COVID-19 exposure. -in ED patient noted to be in A. fib with RVR requiring Cardizem drip, potassium 3.2, CT abdomen and pelvis significant for left side: Colitis, I was called to admit.    Review of systems:    In addition to the HPI above,  No Fever-chills, she reports generalized weakness, poor appetite No Headache, No changes with Vision or hearing, No problems swallowing food or Liquids, No Chest pain, Cough or Shortness of Breath, Reports abdominal pain, nausea, diarrhea, poor appetite, blood in stool . No Blood in  Urine, No  dysuria, No new skin rashes or bruises, No new joints pains-aches,  No new weakness, tingling, numbness in any extremity, No recent weight gain or loss, No polyuria, polydypsia or polyphagia, No significant Mental Stressors.  A full 10 point Review of Systems was done, except as stated above, all other Review of Systems were negative.   With Past History of the following :    Past Medical History:  Diagnosis Date  . Anemia   . Anginal pain ([place])    PATIENT STATES DOCT THINKS IS FIBROMYALGIA  . Arthritis   . Asthma   . Atrial fibrillation ([place])   . Blood clot in lung last march  . Cancer ([place])    1980  CERVICAL  . Chronic back pain   . COPD (chronic obstructive pulmonary disease) ([place])   . DDD (degenerative disc disease)   . Degenerative tear of posterior horn of lateral meniscus of right knee 01/27/2014  . Depression   . Diabetes mellitus without complication ([place])   .  Diverticulitis   . Fibromyalgia   . GERD (gastroesophageal reflux disease)   . Headache    HX MIGRAINES    . Hiatal hernia   . HOH (hard of hearing)    right  . Pancreatitis   . Pneumonia    1 YR  . PONV (postoperative nausea and vomiting)    only happened after knee arthroscopy  . Primary localized osteoarthritis of right knee 12/11/2015  . PVC's (premature ventricular contractions)   . Schatzki's ring   . Seizures ([place])    had febrile seizures as child; none since childhood and on no meds  . Shortness of breath dyspnea    WITH EXERTION   . Sleep apnea    CPAP at night  . Ulcerative colitis 2007  . Wears dentures    full top-partial bottom      Past Surgical History:  Procedure Laterality Date  . arch and foot    . BALLOON DILATION  11/17/2018   Procedure: BALLOON DILATION;  Surgeon: Daneil Dolin, MD;  [facility];  Service: Endoscopy;;  esophagus  . BIOPSY  06/01/2019   Procedure: BIOPSY;  Surgeon: Daneil Dolin, MD;  [facility];  Service: Endoscopy;;  .  CERVICAL CONE BIOPSY    . CHOLECYSTECTOMY    . COLONOSCOPY  12/03/05   Rourk-marked inflammatory changes of the rectum, left colon, pan colitis, internal hemorrhoids, sigmoid diverticula  . COLONOSCOPY N/A 10/20/2013   Dr. Rourk:Colonic diverticulosis. colonic mucosal ulcerations involving segment of sigmoid colon, atypical for UC. sigmoid colon path with chronic mildly active colitis consistent with IBD, other biopsies including ascending, transverse, descending colon and rectum unremarkable.   . COLONOSCOPY N/A 11/19/2016   Dr. Gala Romney: Diverticulosis, 7 millimeter polyp removed from the descending colon, internal hemorrhoids, segmental biopsies unremarkable.  Marland Kitchen DILATION AND CURETTAGE OF UTERUS    . ESOPHAGEAL DILATION N/A 09/20/2015   Procedure: ESOPHAGEAL DILATION;  Surgeon: Daneil Dolin, MD;  [facility];  Service: Endoscopy;  Laterality: N/A;  . ESOPHAGOGASTRODUODENOSCOPY  01/28/2011   Rourk- Schatzki ring, s/p 69F, otherwise normal/Hiatal hernia otherwise normal stomach D1 and D2  . ESOPHAGOGASTRODUODENOSCOPY  11/30/08   Rourk-Schatzki's ring status post dilation, hiatal hernia  . ESOPHAGOGASTRODUODENOSCOPY  07/12/2007   Dilation with 56 French, Schatzki's ring  . ESOPHAGOGASTRODUODENOSCOPY N/A 08/10/2014   Dr.Rourk- prominent schatzki's ring s/p maloney dilation and disruption, moderate sized hiatal hernia  . ESOPHAGOGASTRODUODENOSCOPY N/A 09/20/2015   RMR: Schatzki's ring status post dilation as described above. Rather large hiatal hernia; somewhat baggy atonic esophageal body' otherwise normal EGD  . ESOPHAGOGASTRODUODENOSCOPY N/A 11/19/2016   Dr. Gala Romney: moderate Schatzki ring s/p dirsuption with biopsy forceps, large hiatal hernia  . ESOPHAGOGASTRODUODENOSCOPY N/A 11/17/2018   Procedure: ESOPHAGOGASTRODUODENOSCOPY (EGD);  Surgeon: Daneil Dolin, MD;  [facility];  Service: Endoscopy;  Laterality: N/A;  12:00PM  . ESOPHAGOGASTRODUODENOSCOPY (EGD) WITH ESOPHAGEAL  DILATION  09/01/2012   Dr. Gala Romney- schatzki's ring, hiatal hernia  . ESOPHAGOGASTRODUODENOSCOPY (EGD) WITH PROPOFOL N/A 02/08/2018   Procedure: ESOPHAGOGASTRODUODENOSCOPY (EGD) WITH PROPOFOL;  Surgeon: Daneil Dolin, MD;  [facility];  Service: Endoscopy;  Laterality: N/A;  10:30am  . FLEXIBLE SIGMOIDOSCOPY N/A 06/01/2019   Procedure: FLEXIBLE SIGMOIDOSCOPY;  Surgeon: Daneil Dolin, MD;  [facility];  Service: Endoscopy;  Laterality: N/A;  . FOOT ARTHRODESIS  2007,2010   both feet  . HERNIA REPAIR     umbilical  . JOINT REPLACEMENT Right   .  KNEE ARTHROSCOPY WITH LATERAL MENISECTOMY Right 01/27/2014   Procedure: RIGHT KNEE ARTHROSCOPY WITH PARTIAL LATERAL MENISECTOMY, ;  Surgeon: Johnny Bridge, MD;  Location: [facility];  Service: Orthopedics;  Laterality: Right;  . MALONEY DILATION N/A 08/10/2014   Procedure: [name] DILATION;  Surgeon: Daneil Dolin, MD;  [facility];  Service: Endoscopy;  Laterality: N/A;  [name] DILATION N/A 11/19/2016   Procedure: [name] DILATION;  Surgeon: Daneil Dolin, MD;  [facility];  Service: Endoscopy;  Laterality: N/A;  . [name] DILATION N/A 02/08/2018   Procedure: [name] DILATION;  Surgeon: Daneil Dolin, MD;  [facility];  Service: Endoscopy;  Laterality: N/A;  [name] DILATION N/A 11/17/2018   Procedure: [name] DILATION;  Surgeon: Daneil Dolin, MD;  [facility];  Service: Endoscopy;  Laterality: N/A;  . SAVORY DILATION N/A 08/10/2014   Procedure: SAVORY DILATION;  Surgeon: Daneil Dolin, MD;  [facility];  Service: Endoscopy;  Laterality: N/A;  . SHOULDER OPEN ROTATOR CUFF REPAIR     BILAT  . TONSILLECTOMY    . TOTAL KNEE ARTHROPLASTY Right 12/11/2015   Procedure: TOTAL KNEE ARTHROPLASTY;  Surgeon: Marchia Bond, MD;  Location: [facility];  Service: Orthopedics;  Laterality: Right;      Social History:     Social History   Tobacco Use  .  Smoking status: Former Smoker    Packs/day: 1.00    Years: 30.00    Pack years: 30.00    Types: Cigarettes    Start date: 07/07/1969    Quit date: 08/30/2011    Years since quitting: 7.8  . Smokeless tobacco: Never Used  Substance Use Topics  . Alcohol use: No    Alcohol/week: 0.0 standard drinks        Family History :     Family History  Problem Relation Age of Onset  . Heart disease Mother   . Colitis Mother   . Atrial fibrillation Sister   . Arrhythmia Brother   . Colon cancer Neg Hx       Home Medications:   Prior to Admission medications   Medication Sig Start Date End Date Taking? Authorizing Provider  acetaminophen (TYLENOL) 500 MG tablet Take 1,000 mg by mouth every 6 (six) hours as needed for moderate pain or headache.    [provider]  albuterol (PROVENTIL) (2.5 MG/3ML) 0.083% nebulizer solution Take 2.5 mg by nebulization every 6 (six) hours as needed for wheezing or shortness of breath.    [provider]  albuterol (PROVENTIL,VENTOLIN) 90 MCG/ACT inhaler Inhale 2 puffs into the lungs every 6 (six) hours as needed for wheezing or shortness of breath.     [provider]  azelastine (ASTELIN) 137 MCG/SPRAY nasal spray 2 sprays by Nasal route 2 (two) times daily. Use in each nostril as directed     [provider]  balsalazide (COLAZAL) 750 MG capsule Take 3 capsules (2,250 mg total) by mouth 3 (three) times daily. 06/15/19   Johnson, Clanford L, MD  buPROPion (WELLBUTRIN XL) 150 MG 24 hr tablet Take 150 mg by mouth daily. 10/09/15   [provider]  busPIRone (BUSPAR) 15 MG tablet Take 7.5 mg by mouth 2 (two) times daily. 09/01/18   [provider]  Calcium Carb-Cholecalciferol (CALCIUM-VITAMIN D) 600-400 MG-UNIT TABS Take 1 tablet by mouth 2 (two) times daily.    [provider]  cetirizine (ZYRTEC) 10 MG tablet Take 10 mg  by mouth daily.    [provider]  dicyclomine (BENTYL) 10 MG capsule  Take 1 capsule (10 mg total) by mouth 2 (two) times daily before a meal. 06/15/19   Johnson, Clanford L, MD  DULoxetine (CYMBALTA) 30 MG capsule Take 120 mg by mouth daily.     [provider]  ELIQUIS 5 MG TABS tablet Take 1 tablet (5 mg total) by mouth 2 (two) times daily. 02/25/19   Arnoldo Lenis, MD  esomeprazole (NEXIUM) 40 MG capsule Take 1 capsule (40 mg total) by mouth 2 (two) times daily before a meal. 04/07/19   Mahala Menghini, PA-C  famotidine-calcium carbonate-magnesium hydroxide (PEPCID COMPLETE) 10-800-165 MG CHEW chewable tablet Chew 1 tablet by mouth daily as needed (for breakthrough GERD).     [provider]  ferrous sulfate 325 (65 FE) MG tablet Take 325 mg by mouth daily with breakfast.    [provider]  fluticasone (FLONASE) 50 MCG/ACT nasal spray Place 2 sprays into both nostrils daily. 04/07/19   Kozlow, Donnamarie Poag, MD  Fluticasone-Salmeterol (ADVAIR DISKUS) 500-50 MCG/DOSE AEPB Inhale one puff twice daily 04/07/19   Kozlow, Donnamarie Poag, MD  FREESTYLE LITE test strip  01/17/19   [provider]  ketotifen (ALAWAY) 0.025 % ophthalmic solution Place 1 drop into both eyes daily.     [provider]  lidocaine (LIDODERM) 5 % Place 1-2 patches onto the skin daily as needed (pain). Remove & Discard patch within 12 hours or as directed by MD    [provider]  metoprolol tartrate (LOPRESSOR) 50 MG tablet Take 1 tablet (50 mg total) by mouth 2 (two) times daily. 06/15/19 07/15/19  Murlean Iba, MD  pravastatin (PRAVACHOL) 20 MG tablet TAKE 1 TABLET DAILY 04/05/19   Arnoldo Lenis, MD  predniSONE (DELTASONE) 10 MG tablet Prednisone 60 mg daily x 2 weeks followed by a taper of 50 mg x 1 week, 40 mg x 1 week, 30 mg x 1 week, 20 mg x 1 week, 15 mg x 1 week, 10 mg x 1 week, 5 mg x 1 week, then stop prednisone.  Please give sufficient quantity of tablets 06/15/19   Johnson, Clanford L, MD  pregabalin (LYRICA) 100 MG capsule Take 100 mg by  mouth 2 (two) times daily.    [provider]  Probiotic CAPS Take 1 capsule by mouth daily.    [provider]  traZODone (DESYREL) 100 MG tablet Take 100 mg by mouth at bedtime.      [provider]  vancomycin (VANCOCIN) 50 mg/mL oral solution Take 5 mL daily through 9/20, then 5 mL QOD for one week, then 5 mL every 3 days for one week. 06/15/19   Murlean Iba, MD     Allergies:     Allergies  Allergen Reactions  . Mesalamine Other (See Comments)    Pancreatitis   . Cefprozil Other (See Comments)    Aggravates Ulcerative colitis   . Cefuroxime Axetil Other (See Comments)    Aggravates Ulcerative colitis   . Morphine And Related Hives and Itching  . Penicillins Hives    Has patient had a PCN reaction causing immediate rash, facial/tongue/throat swelling, SOB or lightheadedness with hypotension: No Has patient had a PCN reaction causing severe rash involving mucus membranes or skin necrosis: No Has patient had a PCN reaction that required hospitalization No Has patient had a PCN reaction occurring within the last 10 years: yes If all of the  above answers are "NO", then may proceed with Cephalosporin use.   . Eggs Or Egg-Derived Products   . Turkey-Sweet Potatoes-Peaches [[place]itraq]   . Aspirin Other (See Comments)    Affects the central nervous system in high doses . "shakey"  . Chicken Allergy Nausea And Vomiting and Rash  . Clarithromycin Nausea And Vomiting  . Codeine Other (See Comments)    Hallucinations/bad dreams.  . Entex Other (See Comments)    insomnia     Physical Exam:   Vitals  Blood pressure 124/81, pulse 91, temperature 98.5 F (36.9 C), temperature source Oral, resp. rate 17, height 5' 4"  (1.626 m), weight 89.4 kg, SpO2 94 %.   1. General well-developed female, laying in bed in mild discomfort  2. Normal affect and insight, Not Suicidal or Homicidal, Awake Alert, Oriented X 3.  3. No F.N deficits, ALL C.Nerves Intact,  Strength 5/5 all 4 extremities, Sensation intact all 4 extremities, Plantars down going.  4. Ears and Eyes appear Normal, Conjunctivae clear, PERRLA. Moist Oral Mucosa.  5. Supple Neck, No JVD, No cervical lymphadenopathy appriciated, No Carotid Bruits.  6. Symmetrical Chest wall movement, Good air movement bilaterally, CTAB.  7.  Irregular irregular, tachycardic, No Gallops, Rubs or Murmurs, No Parasternal Heave.  8. Positive Bowel Sounds, Abdomen Soft, left side tenderness, No organomegaly appriciated,No rebound -guarding or rigidity.  9.  No Cyanosis, Normal Skin Turgor, No Skin Rash or Bruise.  10. Good muscle tone,  joints appear normal , no effusions, Normal ROM.  11. No Palpable Lymph Nodes in Neck or Axillae    Data Review:    CBC Recent Labs  Lab 07/13/19 1447  WBC 12.0*  HGB 12.8  HCT 40.6  PLT 138*  MCV 93.1  MCH 29.4  MCHC 31.5  RDW 16.0*  LYMPHSABS 3.3  MONOABS 1.1*  EOSABS 0.1  BASOSABS 0.1   ------------------------------------------------------------------------------------------------------------------  Chemistries  Recent Labs  Lab 07/13/19 1447  NA 134*  K 3.2*  CL 96*  CO2 24  GLUCOSE 369*  BUN 9  CREATININE 0.79  CALCIUM 9.4  AST 8*  ALT 12  ALKPHOS 60  BILITOT 0.9   ------------------------------------------------------------------------------------------------------------------ estimated creatinine clearance is 70.9 mL/min (by C-G formula based on SCr of 0.79 mg/dL). ------------------------------------------------------------------------------------------------------------------ No results for input(s): TSH, T4TOTAL, T3FREE, THYROIDAB in the last 72 hours.  Invalid input(s): FREET3  Coagulation profile Recent Labs  Lab 07/13/19 1447  INR 1.0   ------------------------------------------------------------------------------------------------------------------- No results for input(s): DDIMER in the last 72 hours.  -------------------------------------------------------------------------------------------------------------------  Cardiac Enzymes No results for input(s): CKMB, TROPONINI, MYOGLOBIN in the last 168 hours.  Invalid input(s): CK ------------------------------------------------------------------------------------------------------------------ No results found for: BNP   ---------------------------------------------------------------------------------------------------------------  Urinalysis    Component Value Date/Time   COLORURINE STRAW (A) 07/13/2019 1427   APPEARANCEUR CLEAR 07/13/2019 1427   LABSPEC 1.029 07/13/2019 1427   PHURINE 6.0 07/13/2019 1427   GLUCOSEU >=500 (A) 07/13/2019 1427   HGBUR NEGATIVE 07/13/2019 1427   BILIRUBINUR NEGATIVE 07/13/2019 1427   KETONESUR 20 (A) 07/13/2019 1427   PROTEINUR NEGATIVE 07/13/2019 1427   NITRITE NEGATIVE 07/13/2019 1427   LEUKOCYTESUR TRACE (A) 07/13/2019 1427    ----------------------------------------------------------------------------------------------------------------   Imaging Results:    Ct Abdomen Pelvis W Contrast  Result Date: 07/13/2019 CLINICAL DATA:  Diarrhea for 2-3 days. Prior admission 3 weeks ago for C. difficile colitis. EXAM: CT ABDOMEN AND PELVIS WITH CONTRAST TECHNIQUE: Multidetector CT imaging of the abdomen and pelvis was performed using the standard protocol following bolus administration of  intravenous contrast. CONTRAST:  191m OMNIPAQUE IOHEXOL 300 MG/ML  SOLN COMPARISON:  05/20/2019 FINDINGS: Lower chest: Large hiatal hernia containing most of stomach. The gastroesophageal junction is well above the hiatus. There some organoaxial rotation of the herniated stomach. Upper normal heart size. Passive atelectasis associated with the large hiatal hernia. Descending thoracic aortic atherosclerotic vascular disease. Hepatobiliary: Cholecystectomy. The liver appears otherwise unremarkable. No biliary dilatation.  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 Do not directly observe an ectopic ureterocele. No scarring or hydronephrosis. The right kidney appears unremarkable. Adrenal glands normal. Stomach/Bowel: As noted above most of stomach is herniated into the chest. Small periampullary duodenal diverticulum. There is abnormal wall thickening and mucosal enhancement in the cecum and ascending colon. Distal descending and sigmoid colon diverticulosis with very faint adjacent stranding in the mesentery, less than on 05/20/2019, but still potentially manifestation of low-grade diverticulitis. Mildly accentuated mucosal enhancement in the rectum with small rectal air fluid levels. Tiny localized amount of gas adjacent the terminal ileum on image 80/6 which is probably in a small diverticulum, similar appearance suggested on image 47/2 of 05/20/2019 exam. Scattered nondilated jejunal loops with air-fluid levels. Vascular/Lymphatic: Aortoiliac atherosclerotic vascular disease. Widely patent celiac trunk SMA. Patent IMA. Reproductive: Unremarkable Other: No supplemental non-categorized findings. Musculoskeletal: Small periumbilical hernia mesh. Lower thoracic and lumbar spondylosis and degenerative disc disease. IMPRESSION: 1. Abnormal mucosal enhancement wall thickening the cecum and ascending colon favoring colitis. There is some questionable proximal sigmoid diverticulitis, as well as accentuated mucosa enhancement in the sigmoid colon and rectum. The appearance is most compatible with colitis perhaps very low-grade proximal diverticulitis. Favor infectious colitis or inflammatory bowel disease over ischemic colitis given the wide patency of the mesenteric vessels. Given the patient's history, correlation with C difficile toxins is recommended to exclude recurrent C difficile  colitis. 2. Large hiatal hernia with some organoaxial rotation of the herniated stomach. 3. 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 4.  Aortic Atherosclerosis (ICD10-I70.0). Electronically Signed   By: WVan ClinesM.D.   On: 07/13/2019 16:56    My personal review of EKG: Rhythm NSR, Rate  149 /min, QTc 438, no Acute ST changes   Assessment & Plan:    Active Problems:   [place]   Diarrhea   Colitis  Abdominal pain/diarrhea secondary to colitis -Patient complains with abdominal pain, she is having profuse diarrhea, CT abdomen pelvis significant left colonic colitis. -Ulcerative colitis flare versus C. difficile colitis, will obtain C. difficile stool sample. -Continue with p.o. vancomycin empirically, if this ends up being C. difficile positive, likely will need DEFICID. -We will continue with prednisone 40 mg oral daily for presumed ulcerative colitis flare as well. -Continue with IV fluids. -GI consulted regarding further input.  A. fib with RVR -Currently heart rate is uncontrolled in A. fib with RVR heart rate in the 160s, will continue with her home dose metoprolol, and will start on Cardizem drip. -Continue with Eliquis for anticoagulation. -correct her hypokalemia and continue with IV fluids  Hypokalemia -Replaced, recheck in a.m.  COPD -continue bronchodilators seems stable at this time  Anxiety/depression -resume home medications. Stable.  DVT Prophylaxis on ELiquis  AM Labs Ordered, also please review Full Orders  Family Communication: Admission, patients condition and plan of care including tests being ordered have been discussed with the patient and Husband who indicate understanding and agree  with the plan and Code Status.  Code Status Full  Likely DC to Home  Condition GUARDED    Consults called:   Gi consult place in [facility]  Admission status:  Observation  Time spent in minutes : 60  minutes   Phillips Climes M.D on 07/13/2019 at 6:30 PM  Between 7am to 7pm - Pager - [phone]. After 7pm go to [email or web address] - password [facility]  [facility] - Office  [phone]

## 2019-07-13 NOTE — ED Provider Notes (Addendum)
[facility] EMERGENCY DEPARTMENT Provider Note   CSN: [id number] Arrival date & time: 07/13/19  1319    History   Chief Complaint Chief Complaint  Patient presents with  . Diarrhea   HPI [name] is a 68 y.o. female with past medical history significant for A. fib, chronic back pain, COPD, DDD, ulcerative colitis, C. difficile colitis who presents for evaluation of diarrhea and generalized weakness.  Patient hospitalized at the end of August for pancolitis as well as C. difficile.  She was hospitalized for 3 weeks.  Discharge on 06/15/2019.  Patient supposed to take antibiotics and prednisone at that time.  Per patient and husband in room they did not know she is supposed to be on antibiotics.  Patient states over the last 3 days she has had multiple bowel movements approximately 6-10 times a day. So far 6 episodes of diarrhea today.  Has also had persistent nausea.  Has been able to tolerate small sips of liquids however does not have an appetite.  She has had cramping abdominal pain.  Patient states she has had some blood in her stools however this is "there all the time."  Denies any NSAID use, alcohol use.  She is on Xarelto for her atrial fibrillation.  Denies any known COVID exposures.  Denies fever, chills, chest pain, shortness of breath, dysuria, constipation.  She denies any unilateral weakness.  Denies additional aggravating or alleviating factors.  She has not take anything for symptoms.  She does have a follow-up appointment with Dr. Buford Dresser, GI in the middle of November.  Denies fever, chills, sudden onset thunderclap headache, unilateral weakness, sore throat, nasal congestion, chest pain, shortness of breath, extremity weakness.  Patient states she has been so nauseous that she has not wanted to take anything by mouth for fear of emesis.  She has not had any episodes of emesis.  History obtained from patient, family in room, past medical records.  No interpreter was used.     HPI  Past Medical History:  Diagnosis Date  . Anemia   . Anginal pain ([place])    PATIENT STATES DOCT THINKS IS FIBROMYALGIA  . Arthritis   . Asthma   . Atrial fibrillation ([place])   . Blood clot in lung last march  . Cancer ([place])    1980  CERVICAL  . Chronic back pain   . COPD (chronic obstructive pulmonary disease) ([place])   . DDD (degenerative disc disease)   . Degenerative tear of posterior horn of lateral meniscus of right knee 01/27/2014  . Depression   . Diabetes mellitus without complication ([place])   . Diverticulitis   . Fibromyalgia   . GERD (gastroesophageal reflux disease)   . Headache    HX MIGRAINES    . Hiatal hernia   . HOH (hard of hearing)    right  . Pancreatitis   . Pneumonia    1 YR  . PONV (postoperative nausea and vomiting)    only happened after knee arthroscopy  . Primary localized osteoarthritis of right knee 12/11/2015  . PVC's (premature ventricular contractions)   . Schatzki's ring   . Seizures ([place])    had febrile seizures as child; none since childhood and on no meds  . Shortness of breath dyspnea    WITH EXERTION   . Sleep apnea    CPAP at night  . Ulcerative colitis 2007  . Wears dentures    full top-partial bottom    Patient Active Problem List  Diagnosis Date Noted  . Hematochezia   . IBD (inflammatory bowel disease)   . Tachycardia   . Enteritis due to Clostridium difficile   . Colitis 05/21/2019  . IDA (iron deficiency anemia) 08/11/2018  . Diarrhea 01/06/2018  . Primary localized osteoarthritis of right knee 12/11/2015  . 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  . Hiatal hernia   . Schatzki's ring   . Degenerative tear of posterior horn of lateral meniscus of right knee 01/27/2014  . Ulcerative colitis ([place]) 01/09/2011  . Dysphagia 01/09/2011  . CONSTIPATION 08/01/2009  . LLQ pain 08/01/2009  . GERD 06/14/2009  . ULCERATIVE COLITIS--UNIVERSAL ULCERATIVE COLITIS 06/14/2009    Past Surgical History:  Procedure  Laterality Date  . arch and foot    . BALLOON DILATION  11/17/2018   Procedure: BALLOON DILATION;  Surgeon: Daneil Dolin, MD;  [facility];  Service: Endoscopy;;  esophagus  . BIOPSY  06/01/2019   Procedure: BIOPSY;  Surgeon: Daneil Dolin, MD;  [facility];  Service: Endoscopy;;  . CERVICAL CONE BIOPSY    . CHOLECYSTECTOMY    . COLONOSCOPY  12/03/05   Rourk-marked inflammatory changes of the rectum, left colon, pan colitis, internal hemorrhoids, sigmoid diverticula  . COLONOSCOPY N/A 10/20/2013   Dr. Rourk:Colonic diverticulosis. colonic mucosal ulcerations involving segment of sigmoid colon, atypical for UC. sigmoid colon path with chronic mildly active colitis consistent with IBD, other biopsies including ascending, transverse, descending colon and rectum unremarkable.   . COLONOSCOPY N/A 11/19/2016   Dr. Gala Romney: Diverticulosis, 7 millimeter polyp removed from the descending colon, internal hemorrhoids, segmental biopsies unremarkable.  Marland Kitchen DILATION AND CURETTAGE OF UTERUS    . ESOPHAGEAL DILATION N/A 09/20/2015   Procedure: ESOPHAGEAL DILATION;  Surgeon: Daneil Dolin, MD;  [facility];  Service: Endoscopy;  Laterality: N/A;  . ESOPHAGOGASTRODUODENOSCOPY  01/28/2011   Rourk- Schatzki ring, s/p 67F, otherwise normal/Hiatal hernia otherwise normal stomach D1 and D2  . ESOPHAGOGASTRODUODENOSCOPY  11/30/08   Rourk-Schatzki's ring status post dilation, hiatal hernia  . ESOPHAGOGASTRODUODENOSCOPY  07/12/2007   Dilation with 56 French, Schatzki's ring  . ESOPHAGOGASTRODUODENOSCOPY N/A 08/10/2014   Dr.Rourk- prominent schatzki's ring s/p maloney dilation and disruption, moderate sized hiatal hernia  . ESOPHAGOGASTRODUODENOSCOPY N/A 09/20/2015   RMR: Schatzki's ring status post dilation as described above. Rather large hiatal hernia; somewhat baggy atonic esophageal body' otherwise normal EGD  . ESOPHAGOGASTRODUODENOSCOPY N/A 11/19/2016   Dr. Gala Romney: moderate  Schatzki ring s/p dirsuption with biopsy forceps, large hiatal hernia  . ESOPHAGOGASTRODUODENOSCOPY N/A 11/17/2018   Procedure: ESOPHAGOGASTRODUODENOSCOPY (EGD);  Surgeon: Daneil Dolin, MD;  [facility];  Service: Endoscopy;  Laterality: N/A;  12:00PM  . ESOPHAGOGASTRODUODENOSCOPY (EGD) WITH ESOPHAGEAL DILATION  09/01/2012   Dr. Gala Romney- schatzki's ring, hiatal hernia  . ESOPHAGOGASTRODUODENOSCOPY (EGD) WITH PROPOFOL N/A 02/08/2018   Procedure: ESOPHAGOGASTRODUODENOSCOPY (EGD) WITH PROPOFOL;  Surgeon: Daneil Dolin, MD;  [facility];  Service: Endoscopy;  Laterality: N/A;  10:30am  . FLEXIBLE SIGMOIDOSCOPY N/A 06/01/2019   Procedure: FLEXIBLE SIGMOIDOSCOPY;  Surgeon: Daneil Dolin, MD;  [facility];  Service: Endoscopy;  Laterality: N/A;  . FOOT ARTHRODESIS  2007,2010   both feet  . HERNIA REPAIR     umbilical  . JOINT REPLACEMENT Right   . KNEE ARTHROSCOPY WITH LATERAL MENISECTOMY Right 01/27/2014   Procedure: RIGHT KNEE ARTHROSCOPY WITH PARTIAL LATERAL MENISECTOMY, ;  Surgeon: Johnny Bridge, MD;  Location: [place];  Service: Orthopedics;  Laterality: Right;  . MALONEY DILATION N/A 08/10/2014   Procedure: [name] DILATION;  Surgeon: Daneil Dolin, MD;  [facility];  Service: Endoscopy;  Laterality: N/A;  [name] DILATION N/A 11/19/2016   Procedure: [name] DILATION;  Surgeon: Daneil Dolin, MD;  [facility];  Service: Endoscopy;  Laterality: N/A;  . [name] DILATION N/A 02/08/2018   Procedure: [name] DILATION;  Surgeon: Daneil Dolin, MD;  [facility];  Service: Endoscopy;  Laterality: N/A;  [name] DILATION N/A 11/17/2018   Procedure: [name] DILATION;  Surgeon: Daneil Dolin, MD;  [facility];  Service: Endoscopy;  Laterality: N/A;  . SAVORY DILATION N/A 08/10/2014   Procedure: SAVORY DILATION;  Surgeon: Daneil Dolin, MD;  [facility];  Service: Endoscopy;   Laterality: N/A;  . SHOULDER OPEN ROTATOR CUFF REPAIR     BILAT  . TONSILLECTOMY    . TOTAL KNEE ARTHROPLASTY Right 12/11/2015   Procedure: TOTAL KNEE ARTHROPLASTY;  Surgeon: Marchia Bond, MD;  Location: [place];  Service: Orthopedics;  Laterality: Right;    OB History   No obstetric history on file.    Home Medications    Prior to Admission medications   Medication Sig Start Date End Date Taking? Authorizing Provider  acetaminophen (TYLENOL) 500 MG tablet Take 1,000 mg by mouth every 6 (six) hours as needed for moderate pain or headache.    [provider]  albuterol (PROVENTIL) (2.5 MG/3ML) 0.083% nebulizer solution Take 2.5 mg by nebulization every 6 (six) hours as needed for wheezing or shortness of breath.    [provider]  albuterol (PROVENTIL,VENTOLIN) 90 MCG/ACT inhaler Inhale 2 puffs into the lungs every 6 (six) hours as needed for wheezing or shortness of breath.     [provider]  azelastine (ASTELIN) 137 MCG/SPRAY nasal spray 2 sprays by Nasal route 2 (two) times daily. Use in each nostril as directed     [provider]  balsalazide (COLAZAL) 750 MG capsule Take 3 capsules (2,250 mg total) by mouth 3 (three) times daily. 06/15/19   Johnson, Clanford L, MD  buPROPion (WELLBUTRIN XL) 150 MG 24 hr tablet Take 150 mg by mouth daily. 10/09/15   [provider]  busPIRone (BUSPAR) 15 MG tablet Take 7.5 mg by mouth 2 (two) times daily. 09/01/18   [provider]  Calcium Carb-Cholecalciferol (CALCIUM-VITAMIN D) 600-400 MG-UNIT TABS Take 1 tablet by mouth 2 (two) times daily.    [provider]  cetirizine (ZYRTEC) 10 MG tablet Take 10 mg by mouth daily.    [provider]  dicyclomine (BENTYL) 10 MG capsule Take 1 capsule (10 mg total) by mouth 2 (two) times daily before a meal. 06/15/19   Johnson, Clanford L, MD  DULoxetine (CYMBALTA) 30 MG capsule Take 120 mg by mouth daily.     [provider]  ELIQUIS 5  MG TABS tablet Take 1 tablet (5 mg total) by mouth 2 (two) times daily. 02/25/19   Arnoldo Lenis, MD  esomeprazole (NEXIUM) 40 MG capsule Take 1 capsule (40 mg total) by mouth 2 (two) times daily before a meal. 04/07/19   Mahala Menghini, PA-C  famotidine-calcium carbonate-magnesium hydroxide (PEPCID COMPLETE) 10-800-165 MG CHEW chewable tablet Chew 1 tablet by mouth daily as needed (for breakthrough GERD).     [provider]  ferrous sulfate 325 (65 FE) MG tablet Take 325 mg by mouth daily with breakfast.  [provider]  fluticasone (FLONASE) 50 MCG/ACT nasal spray Place 2 sprays into both nostrils daily. 04/07/19   Kozlow, Donnamarie Poag, MD  Fluticasone-Salmeterol (ADVAIR DISKUS) 500-50 MCG/DOSE AEPB Inhale one puff twice daily 04/07/19   Kozlow, Donnamarie Poag, MD  FREESTYLE LITE test strip  01/17/19   [provider]  ketotifen (ALAWAY) 0.025 % ophthalmic solution Place 1 drop into both eyes daily.     [provider]  lidocaine (LIDODERM) 5 % Place 1-2 patches onto the skin daily as needed (pain). Remove & Discard patch within 12 hours or as directed by MD    [provider]  metoprolol tartrate (LOPRESSOR) 50 MG tablet Take 1 tablet (50 mg total) by mouth 2 (two) times daily. 06/15/19 07/15/19  Murlean Iba, MD  pravastatin (PRAVACHOL) 20 MG tablet TAKE 1 TABLET DAILY 04/05/19   Arnoldo Lenis, MD  predniSONE (DELTASONE) 10 MG tablet Prednisone 60 mg daily x 2 weeks followed by a taper of 50 mg x 1 week, 40 mg x 1 week, 30 mg x 1 week, 20 mg x 1 week, 15 mg x 1 week, 10 mg x 1 week, 5 mg x 1 week, then stop prednisone.  Please give sufficient quantity of tablets 06/15/19   Johnson, Clanford L, MD  pregabalin (LYRICA) 100 MG capsule Take 100 mg by mouth 2 (two) times daily.    [provider]  Probiotic CAPS Take 1 capsule by mouth daily.    [provider]  traZODone (DESYREL) 100 MG tablet Take 100 mg by mouth at bedtime.      [provider]  vancomycin (VANCOCIN) 50 mg/mL oral solution Take 5 mL daily through 9/20, then 5 mL QOD for one week, then 5 mL every 3 days for one week. 06/15/19   Murlean Iba, MD    Family History Family History  Problem Relation Age of Onset  . Heart disease Mother   . Colitis Mother   . Atrial fibrillation Sister   . Arrhythmia Brother   . Colon cancer Neg Hx     Social History Social History   Tobacco Use  . Smoking status: Former Smoker    Packs/day: 1.00    Years: 30.00    Pack years: 30.00    Types: Cigarettes    Start date: 07/07/1969    Quit date: 08/30/2011    Years since quitting: 7.8  . Smokeless tobacco: Never Used  Substance Use Topics  . Alcohol use: No    Alcohol/week: 0.0 standard drinks  . Drug use: No     Allergies   Mesalamine, Cefprozil, Cefuroxime axetil, Morphine and related, Penicillins, Eggs or egg-derived products, Turkey-sweet potatoes-peaches [alitraq], Aspirin, Chicken allergy, Clarithromycin, Codeine, and Entex   Review of Systems Review of Systems  Constitutional: Negative.   HENT: Negative.   Eyes: Negative.   Respiratory: Negative.   Cardiovascular: Negative.   Gastrointestinal: Positive for abdominal pain, blood in stool, diarrhea and nausea. Negative for abdominal distention, anal bleeding, constipation, rectal pain and vomiting.  Genitourinary: Negative.   Musculoskeletal: Negative.   Skin: Negative.   Neurological: Positive for weakness (Generalized).  All other systems reviewed and are negative.  Physical Exam Updated Vital Signs BP 124/81   Pulse 91   Temp 98.5 F (36.9 C) (Oral)   Resp 17   Ht 5' 4"  (1.626 m)   Wt 89.4 kg   SpO2 94%   BMI 33.81 kg/m   Physical Exam Vitals signs and  nursing note reviewed.  Constitutional:      General: She is not in acute distress.    Appearance: She is well-developed. She is not ill-appearing, toxic-appearing or diaphoretic.  HENT:     Head: Normocephalic and  atraumatic.     Nose: Nose normal.     Mouth/Throat:     Mouth: Mucous membranes are moist.     Pharynx: Oropharynx is clear.  Eyes:     Pupils: Pupils are equal, round, and reactive to light.  Neck:     Musculoskeletal: Normal range of motion.  Cardiovascular:     Rate and Rhythm: Normal rate.     Pulses: Normal pulses.     Heart sounds: Normal heart sounds.  Pulmonary:     Effort: Pulmonary effort is normal. No respiratory distress.  Abdominal:     General: Bowel sounds are normal. There is no distension.     Palpations: There is no mass.     Tenderness: There is abdominal tenderness. There is no guarding or rebound.     Hernia: No hernia is present.     Comments: Soft, generalized tenderness palpation.  No focal tenderness.  No rebound or guarding.  Normoactive bowel sounds.  Musculoskeletal: Normal range of motion.     Comments: Moves all 4 extremities without difficulty.  Skin:    General: Skin is warm and dry.     Comments: No rashes or lesions.  Brisk capillary refill.  Neurological:     Mental Status: She is alert.     Comments: Mental Status:  Alert, oriented, thought content appropriate. Speech fluent without evidence of aphasia. Able to follow 2 step commands without difficulty.  Cranial Nerves:  II:  Peripheral visual fields grossly normal, pupils equal, round, reactive to light III,IV, VI: ptosis not present, extra-ocular motions intact bilaterally  V,VII: smile symmetric, facial light touch sensation equal VIII: hearing grossly normal bilaterally  IX,X: midline uvula rise  XI: bilateral shoulder shrug equal and strong XII: midline tongue extension  Motor:  5/5 in upper and lower extremities bilaterally including strong and equal grip strength and dorsiflexion/plantar flexion Sensory: Pinprick and light touch normal in all extremities.  Deep Tendon Reflexes: 2+ and symmetric  Cerebellar: normal finger-to-nose with bilateral upper extremities Gait: normal gait  and balance CV: distal pulses palpable throughout       ED Treatments / Results  Labs (all labs ordered are listed, but only abnormal results are displayed) Labs Reviewed  CBC WITH DIFFERENTIAL/PLATELET - Abnormal; Notable for the following components:      Result Value   WBC 12.0 (*)    RDW 16.0 (*)    Platelets 138 (*)    Monocytes Absolute 1.1 (*)    Abs Immature Granulocytes 0.16 (*)    All other components within normal limits  COMPREHENSIVE METABOLIC PANEL - Abnormal; Notable for the following components:   Sodium 134 (*)    Potassium 3.2 (*)    Chloride 96 (*)    Glucose, Bld 369 (*)    Total Protein 5.9 (*)    Albumin 3.2 (*)    AST 8 (*)    All other components within normal limits  URINALYSIS, ROUTINE W REFLEX MICROSCOPIC - Abnormal; Notable for the following components:   Color, Urine STRAW (*)    Glucose, UA >=500 (*)    Ketones, ur 20 (*)    Leukocytes,Ua TRACE (*)    All other components within normal limits  CBG MONITORING, ED - Abnormal; Notable  for the following components:   Glucose-Capillary 160 (*)    All other components within normal limits  GI PATHOGEN PANEL BY PCR, STOOL  C DIFFICILE QUICK SCREEN W PCR REFLEX  SARS CORONAVIRUS 2 (TAT 6-24 HRS)  PROTIME-INR    EKG None  Radiology Ct Abdomen Pelvis W Contrast  Result Date: 07/13/2019 CLINICAL DATA:  Diarrhea for 2-3 days. Prior admission 3 weeks ago for C. difficile colitis. EXAM: CT ABDOMEN AND PELVIS WITH CONTRAST TECHNIQUE: Multidetector CT imaging of the abdomen and pelvis was performed using the standard protocol following bolus administration of intravenous contrast. CONTRAST:  128m OMNIPAQUE IOHEXOL 300 MG/ML  SOLN COMPARISON:  05/20/2019 FINDINGS: Lower chest: Large hiatal hernia containing most of stomach. The gastroesophageal junction is well above the hiatus. There some organoaxial rotation of the herniated stomach. Upper normal heart size. Passive atelectasis associated with the  large hiatal hernia. Descending thoracic aortic atherosclerotic vascular disease. Hepatobiliary: Cholecystectomy. The liver appears otherwise unremarkable. No biliary dilatation. 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 Do not directly observe an ectopic ureterocele. No scarring or hydronephrosis. The right kidney appears unremarkable. Adrenal glands normal. Stomach/Bowel: As noted above most of stomach is herniated into the chest. Small periampullary duodenal diverticulum. There is abnormal wall thickening and mucosal enhancement in the cecum and ascending colon. Distal descending and sigmoid colon diverticulosis with very faint adjacent stranding in the mesentery, less than on 05/20/2019, but still potentially manifestation of low-grade diverticulitis. Mildly accentuated mucosal enhancement in the rectum with small rectal air fluid levels. Tiny localized amount of gas adjacent the terminal ileum on image 80/6 which is probably in a small diverticulum, similar appearance suggested on image 47/2 of 05/20/2019 exam. Scattered nondilated jejunal loops with air-fluid levels. Vascular/Lymphatic: Aortoiliac atherosclerotic vascular disease. Widely patent celiac trunk SMA. Patent IMA. Reproductive: Unremarkable Other: No supplemental non-categorized findings. Musculoskeletal: Small periumbilical hernia mesh. Lower thoracic and lumbar spondylosis and degenerative disc disease. IMPRESSION: 1. Abnormal mucosal enhancement wall thickening the cecum and ascending colon favoring colitis. There is some questionable proximal sigmoid diverticulitis, as well as accentuated mucosa enhancement in the sigmoid colon and rectum. The appearance is most compatible with colitis perhaps very low-grade proximal diverticulitis. Favor infectious colitis or inflammatory bowel disease over  ischemic colitis given the wide patency of the mesenteric vessels. Given the patient's history, correlation with C difficile toxins is recommended to exclude recurrent C difficile colitis. 2. Large hiatal hernia with some organoaxial rotation of the herniated stomach. 3. 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 4.  Aortic Atherosclerosis (ICD10-I70.0). Electronically Signed   By: WVan ClinesM.D.   On: 07/13/2019 16:56    Procedures Procedures (including critical care time)  Medications Ordered in ED Medications  ondansetron (ZOFRAN) injection 4 mg (4 mg Intravenous Given 07/13/19 1508)  sodium chloride 0.9 % bolus 500 mL (0 mLs Intravenous Stopped 07/13/19 1623)  iohexol (OMNIPAQUE) 300 MG/ML solution 100 mL (100 mLs Intravenous Contrast Given 07/13/19 1618)  insulin aspart (novoLOG) injection 10 Units (10 Units Subcutaneous Given 07/13/19 1639)  sodium chloride 0.9 % bolus 500 mL (500 mLs Intravenous New Bag/Given 07/13/19 1803)  fentaNYL (SUBLIMAZE) injection 50 mcg (50 mcg Intravenous Given 07/13/19 1802)  promethazine (PHENERGAN) injection 12.5 mg (12.5 mg Intravenous Given 07/13/19 1802)  diltiazem (CARDIZEM) injection 10 mg (10 mg Intravenous Given 07/13/19 1800)   Initial Impression / Assessment and  Plan / ED Course  I have reviewed the triage vital signs and the nursing notes.  Pertinent labs & imaging results that were available during my care of the patient were reviewed by me and considered in my medical decision making (see chart for details).  68 year old female presents for evaluation of diarrhea.  She is afebrile, nonseptic appearing.  Hospitalized for 3 weeks approximately 1 month ago for diffuse colitis, new diagnosis of ulcerative colitis as well as C. Difficile.  Discharge approximately 1 month ago however did not start p.o. vancomycin or steroids at that time.  3 days of diarrhea, approximately 6 times a day.  As well as  generalized nausea.  Admits to bright red blood per rectum however patient states that this is "chronic."  Abdomen soft with generalized tenderness palpation.  Heart and lungs clear.  Plan on labs, imaging.  Will also plan to consult with GI for follow up and continuing Prednisone at dc.  Repeat CDiff testing toxin and antigen negative on 06/10/19  Labs and imaging personally reviewed:  1700: Patient persistently nauseous with generalized weakness.  Will order EKG additional dose of antiemetic as well as additional fluid bolus.  CT scan with possible diverticulitis versus colitis.  Will hold on antibiotics until stool sample.  Elevated blood glucose however no anion gap.  Pending urinalysis to assess for ketones.  She is only on metformin at home.  She has not had to have insulin except for prior admissions.  Will give fluids, insulin. No evidence of DKA.  1750: Has not been able to provide stool sample at this time however orders placed for stool pathogens and C. difficile studies.  Patient continues to endorse generalized weakness, abd pain and persistent nausea.  Patient also noted to go into atrial fibrillation.  She takes metoprolol and Xarelto at home.  Will give 1 dose of Cardizem.  Denies chest pain, shortness of breath.  Will consult with hospitalist for admission for intractable nausea, generalized weakness and intractable abd pain likely associated to C. Diff colitis.  1800: Consulted with Dr. Waldron [facility] who will evaluate patient for admission.  Will hold on antibiotics until C. difficile studies obtained for best targeted therapy.  Patient has been seen and evaluated by attending physician, Dr. Sabra Heck who agrees with above treatment and plan.  1948: Consulted with Dr. Laural [facility] who will evaluate patient tomorrow.   Final Clinical Impressions(s) / ED Diagnoses   Final diagnoses:  Nausea  Diarrhea of presumed infectious origin  Colitis  Paroxysmal atrial fibrillation [facility])    ED  Discharge Orders    None       Desha Bitner A, PA-C 07/13/19 1810    Jak Haggar A, PA-C 07/13/19 1948    Noemi Chapel, MD 07/14/19 [id number]

## 2019-07-13 NOTE — ED Triage Notes (Signed)
Patient complaining of diarrhea x 2-3 days. States she was admitted here 3 weeks ago for colitis and c-diff. Also complaining of abdominal pain.

## 2019-07-13 NOTE — ED Provider Notes (Signed)
Recent admission for pancolitis - ? UC, had C dif - now with 3 days of d/c - 1  Month after d/c from 3 week admission.  She was supposed to be on oral vanc and prednisone - ptdoes not think she had that at d/c  - she does have normal VS - has 6 episodes of diarrhea today.  Benign - abdomen - non peritoneal.  Will need to discuss with GI prior to anticipated d/c affter CT - if neg for surgical findings.  Medical screening examination/treatment/procedure(s) were conducted as a shared visit with non-physician practitioner(s) and myself.  I personally evaluated the patient during the encounter.  Clinical Impression:   Final diagnoses:  Nausea  Diarrhea of presumed infectious origin  Colitis  Paroxysmal atrial fibrillation (HCC)         Noemi Chapel, MD 07/14/19 [phone]

## 2019-07-14 DIAGNOSIS — K51918 Ulcerative colitis, unspecified with other complication: Secondary | ICD-10-CM | POA: Diagnosis not present

## 2019-07-14 DIAGNOSIS — I480 Paroxysmal atrial fibrillation: Secondary | ICD-10-CM | POA: Diagnosis not present

## 2019-07-14 DIAGNOSIS — E11649 Type 2 diabetes mellitus with hypoglycemia without coma: Secondary | ICD-10-CM | POA: Diagnosis not present

## 2019-07-14 DIAGNOSIS — R739 Hyperglycemia, unspecified: Secondary | ICD-10-CM

## 2019-07-14 DIAGNOSIS — Z8249 Family history of ischemic heart disease and other diseases of the circulatory system: Secondary | ICD-10-CM | POA: Diagnosis not present

## 2019-07-14 DIAGNOSIS — E785 Hyperlipidemia, unspecified: Secondary | ICD-10-CM | POA: Diagnosis present

## 2019-07-14 DIAGNOSIS — J449 Chronic obstructive pulmonary disease, unspecified: Secondary | ICD-10-CM | POA: Diagnosis present

## 2019-07-14 DIAGNOSIS — Z880 Allergy status to penicillin: Secondary | ICD-10-CM | POA: Diagnosis not present

## 2019-07-14 DIAGNOSIS — Z886 Allergy status to analgesic agent status: Secondary | ICD-10-CM | POA: Diagnosis not present

## 2019-07-14 DIAGNOSIS — K219 Gastro-esophageal reflux disease without esophagitis: Secondary | ICD-10-CM | POA: Diagnosis present

## 2019-07-14 DIAGNOSIS — Z8541 Personal history of malignant neoplasm of cervix uteri: Secondary | ICD-10-CM | POA: Diagnosis not present

## 2019-07-14 DIAGNOSIS — Z87891 Personal history of nicotine dependence: Secondary | ICD-10-CM | POA: Diagnosis not present

## 2019-07-14 DIAGNOSIS — F330 Major depressive disorder, recurrent, mild: Secondary | ICD-10-CM | POA: Diagnosis not present

## 2019-07-14 DIAGNOSIS — K5100 Ulcerative (chronic) pancolitis without complications: Secondary | ICD-10-CM | POA: Diagnosis not present

## 2019-07-14 DIAGNOSIS — E876 Hypokalemia: Secondary | ICD-10-CM | POA: Diagnosis present

## 2019-07-14 DIAGNOSIS — G4733 Obstructive sleep apnea (adult) (pediatric): Secondary | ICD-10-CM | POA: Diagnosis not present

## 2019-07-14 DIAGNOSIS — R197 Diarrhea, unspecified: Secondary | ICD-10-CM

## 2019-07-14 DIAGNOSIS — Z79899 Other long term (current) drug therapy: Secondary | ICD-10-CM | POA: Diagnosis not present

## 2019-07-14 DIAGNOSIS — Z881 Allergy status to other antibiotic agents status: Secondary | ICD-10-CM | POA: Diagnosis not present

## 2019-07-14 DIAGNOSIS — Z885 Allergy status to narcotic agent status: Secondary | ICD-10-CM | POA: Diagnosis not present

## 2019-07-14 DIAGNOSIS — R41841 Cognitive communication deficit: Secondary | ICD-10-CM | POA: Diagnosis not present

## 2019-07-14 DIAGNOSIS — R4182 Altered mental status, unspecified: Secondary | ICD-10-CM | POA: Diagnosis not present

## 2019-07-14 DIAGNOSIS — M6281 Muscle weakness (generalized): Secondary | ICD-10-CM | POA: Diagnosis not present

## 2019-07-14 DIAGNOSIS — Z743 Need for continuous supervision: Secondary | ICD-10-CM | POA: Diagnosis not present

## 2019-07-14 DIAGNOSIS — R1312 Dysphagia, oropharyngeal phase: Secondary | ICD-10-CM | POA: Diagnosis not present

## 2019-07-14 DIAGNOSIS — K5150 Left sided colitis without complications: Secondary | ICD-10-CM | POA: Diagnosis present

## 2019-07-14 DIAGNOSIS — R519 Headache, unspecified: Secondary | ICD-10-CM | POA: Diagnosis not present

## 2019-07-14 DIAGNOSIS — H9190 Unspecified hearing loss, unspecified ear: Secondary | ICD-10-CM | POA: Diagnosis present

## 2019-07-14 DIAGNOSIS — R262 Difficulty in walking, not elsewhere classified: Secondary | ICD-10-CM | POA: Diagnosis not present

## 2019-07-14 DIAGNOSIS — Z20828 Contact with and (suspected) exposure to other viral communicable diseases: Secondary | ICD-10-CM | POA: Diagnosis present

## 2019-07-14 DIAGNOSIS — R110 Nausea: Secondary | ICD-10-CM | POA: Diagnosis not present

## 2019-07-14 DIAGNOSIS — Z86711 Personal history of pulmonary embolism: Secondary | ICD-10-CM | POA: Diagnosis not present

## 2019-07-14 DIAGNOSIS — D509 Iron deficiency anemia, unspecified: Secondary | ICD-10-CM | POA: Diagnosis not present

## 2019-07-14 DIAGNOSIS — S0990XA Unspecified injury of head, initial encounter: Secondary | ICD-10-CM | POA: Diagnosis not present

## 2019-07-14 DIAGNOSIS — F329 Major depressive disorder, single episode, unspecified: Secondary | ICD-10-CM | POA: Diagnosis present

## 2019-07-14 DIAGNOSIS — F419 Anxiety disorder, unspecified: Secondary | ICD-10-CM | POA: Diagnosis present

## 2019-07-14 DIAGNOSIS — Z96651 Presence of right artificial knee joint: Secondary | ICD-10-CM | POA: Diagnosis present

## 2019-07-14 DIAGNOSIS — K921 Melena: Secondary | ICD-10-CM

## 2019-07-14 DIAGNOSIS — A0471 Enterocolitis due to Clostridium difficile, recurrent: Secondary | ICD-10-CM | POA: Diagnosis present

## 2019-07-14 DIAGNOSIS — K529 Noninfective gastroenteritis and colitis, unspecified: Secondary | ICD-10-CM | POA: Diagnosis not present

## 2019-07-14 DIAGNOSIS — Z7901 Long term (current) use of anticoagulants: Secondary | ICD-10-CM | POA: Diagnosis not present

## 2019-07-14 DIAGNOSIS — R410 Disorientation, unspecified: Secondary | ICD-10-CM | POA: Diagnosis not present

## 2019-07-14 DIAGNOSIS — E1165 Type 2 diabetes mellitus with hyperglycemia: Secondary | ICD-10-CM | POA: Diagnosis not present

## 2019-07-14 LAB — CBC
HCT: 35.2 % — ABNORMAL LOW (ref 36.0–46.0)
Hemoglobin: 11.3 g/dL — ABNORMAL LOW (ref 12.0–15.0)
MCH: 29.8 pg (ref 26.0–34.0)
MCHC: 32.1 g/dL (ref 30.0–36.0)
MCV: 92.9 fL (ref 80.0–100.0)
Platelets: 274 10*3/uL (ref 150–400)
RBC: 3.79 MIL/uL — ABNORMAL LOW (ref 3.87–5.11)
RDW: 16.2 % — ABNORMAL HIGH (ref 11.5–15.5)
WBC: 10.9 10*3/uL — ABNORMAL HIGH (ref 4.0–10.5)
nRBC: 0 % (ref 0.0–0.2)

## 2019-07-14 LAB — MRSA PCR SCREENING: MRSA by PCR: NEGATIVE

## 2019-07-14 LAB — BASIC METABOLIC PANEL
Anion gap: 10 (ref 5–15)
BUN: 7 mg/dL — ABNORMAL LOW (ref 8–23)
CO2: 23 mmol/L (ref 22–32)
Calcium: 8.7 mg/dL — ABNORMAL LOW (ref 8.9–10.3)
Chloride: 105 mmol/L (ref 98–111)
Creatinine, Ser: 0.65 mg/dL (ref 0.44–1.00)
GFR calc Af Amer: >60 mL/min (ref >60)
GFR calc non Af Amer: >60 mL/min (ref >60)
Glucose, Bld: 295 mg/dL — ABNORMAL HIGH (ref 70–99)
Potassium: 3.5 mmol/L (ref 3.5–5.1)
Sodium: 138 mmol/L (ref 135–145)

## 2019-07-14 LAB — GLUCOSE, CAPILLARY
Glucose-Capillary: 521 mg/dL (ref 70–99)
Glucose-Capillary: 447 mg/dL — ABNORMAL HIGH (ref 70–99)
Glucose-Capillary: 511 mg/dL (ref 70–99)
Glucose-Capillary: 293 mg/dL — ABNORMAL HIGH (ref 70–99)
Glucose-Capillary: 184 mg/dL — ABNORMAL HIGH (ref 70–99)
Glucose-Capillary: 155 mg/dL — ABNORMAL HIGH (ref 70–99)

## 2019-07-14 LAB — C DIFFICILE QUICK SCREEN W PCR REFLEX
C Diff antigen: POSITIVE — AB
C Diff toxin: NEGATIVE

## 2019-07-14 LAB — CLOSTRIDIUM DIFFICILE BY PCR, REFLEXED: Toxigenic C. Difficile by PCR: POSITIVE — AB

## 2019-07-14 LAB — HEMOGLOBIN A1C
Hgb A1c MFr Bld: 9.9 % — ABNORMAL HIGH (ref 4.8–5.6)
Mean Plasma Glucose: 237.43 mg/dL

## 2019-07-14 LAB — SARS CORONAVIRUS 2 (TAT 6-24 HRS): SARS Coronavirus 2: NEGATIVE

## 2019-07-14 MED ORDER — INSULIN ASPART 100 UNIT/ML ~~LOC~~ SOLN: 0.0000 [IU] | Freq: Every day | SUBCUTANEOUS | Status: DC

## 2019-07-14 MED ORDER — METOPROLOL TARTRATE 5 MG/5ML IV SOLN
5.0000 mg | Freq: Once | INTRAVENOUS | Status: AC
  Administered 2019-07-14: 5 mg via INTRAVENOUS
  Filled 2019-07-14: qty 5

## 2019-07-14 MED ORDER — INSULIN GLARGINE 100 UNIT/ML ~~LOC~~ SOLN
12.0000 [IU] | Freq: Every day | SUBCUTANEOUS | Status: DC
  Administered 2019-07-14: 12 [IU] via SUBCUTANEOUS
  Filled 2019-07-14 (×2): qty 0.12

## 2019-07-14 MED ORDER — SODIUM CHLORIDE 0.9 % IV SOLN
INTRAVENOUS | Status: DC
  Administered 2019-07-14: 20:00:00 via INTRAVENOUS

## 2019-07-14 MED ORDER — METOPROLOL TARTRATE 50 MG PO TABS
75.0000 mg | ORAL_TABLET | Freq: Two times a day (BID) | ORAL | Status: DC
  Administered 2019-07-14 – 2019-07-21 (×14): 75 mg via ORAL
  Filled 2019-07-14 (×14): qty 1

## 2019-07-14 MED ORDER — INSULIN ASPART 100 UNIT/ML ~~LOC~~ SOLN
0.0000 [IU] | Freq: Three times a day (TID) | SUBCUTANEOUS | Status: DC
  Administered 2019-07-14: 15 [IU] via SUBCUTANEOUS

## 2019-07-14 MED ORDER — METOPROLOL TARTRATE 5 MG/5ML IV SOLN
5.0000 mg | Freq: Four times a day (QID) | INTRAVENOUS | Status: DC | PRN
  Administered 2019-07-15 – 2019-07-17 (×3): 5 mg via INTRAVENOUS
  Filled 2019-07-14 (×4): qty 5

## 2019-07-14 MED ORDER — INSULIN REGULAR BOLUS VIA INFUSION
0.0000 [IU] | Freq: Three times a day (TID) | INTRAVENOUS | Status: DC
  Filled 2019-07-14: qty 10

## 2019-07-14 MED ORDER — FAMOTIDINE 20 MG PO TABS
20.0000 mg | ORAL_TABLET | Freq: Two times a day (BID) | ORAL | Status: DC
  Administered 2019-07-14 – 2019-07-21 (×15): 20 mg via ORAL
  Filled 2019-07-14 (×15): qty 1

## 2019-07-14 MED ORDER — INSULIN ASPART 100 UNIT/ML ~~LOC~~ SOLN
20.0000 [IU] | Freq: Once | SUBCUTANEOUS | Status: AC
  Administered 2019-07-14: 20 [IU] via SUBCUTANEOUS

## 2019-07-14 MED ORDER — HYDROCORTISONE (PERIANAL) 2.5 % EX CREA
TOPICAL_CREAM | Freq: Two times a day (BID) | CUTANEOUS | Status: DC | PRN
  Filled 2019-07-14: qty 28.35

## 2019-07-14 MED ORDER — DEXTROSE 50 % IV SOLN: 25.0000 mL | INTRAVENOUS | Status: DC | PRN

## 2019-07-14 MED ORDER — INSULIN REGULAR(HUMAN) IN NACL 100-0.9 UT/100ML-% IV SOLN
INTRAVENOUS | Status: DC
  Administered 2019-07-14: 4.5 [IU]/h via INTRAVENOUS
  Filled 2019-07-14: qty 100

## 2019-07-14 MED FILL — Diltiazem HCl IV Soln 125 MG/25ML (5 MG/ML): INTRAVENOUS | Qty: 25 | Status: AC

## 2019-07-14 MED FILL — Dextrose Inj 5%: INTRAVENOUS | Qty: 100 | Status: AC

## 2019-07-14 NOTE — Progress Notes (Signed)
Contacted by nurse regarding elevated CBG's. Patient started on SSI and also Lantus. Despite extra doses of novolog patient's CBG's remains elevated.  Plan: -started on insulin glucostabilizer -made NPO -SSI discontinue for now -patient not on any medication for sugar control at home -A1C pending -follow CBG's  Barton Dubois MD [phone]

## 2019-07-14 NOTE — Progress Notes (Signed)
Noted that lab glucose has been 369 mg/dl on 10/14 at 1447, 295 mg/dl at 0537 on 10/15.  Recommend checking CBGs TID & HS and adding Novolog SENSITIVE correction scale insulin TID & HS while in the hospital and on steroids.   Harvel Ricks RN BSN CDE Diabetes Coordinator Pager: [phone]  8am-5pm

## 2019-07-14 NOTE — Progress Notes (Addendum)
PROGRESS NOTE    [name]  YHC:[id number] DOB: September 13, 1951 DOA: 07/13/2019 PCP: Jalene Mullet, PA-C     Brief Narrative:  68 y.o. female, with past medical history for A. fib, on Eliquis, chronic back pain, COPD, on room air, DDD, ulcerative colitis, C. difficile colitis, with recent prolonged hospitalization secondary to colitis related to ulcerative colitis and C. difficile colitis, she was discharged on prednisone taper, and vancomycin taper, patient/her husband reports she has been compliant with her regimen, patient reports for last 3 days, she has been having worsening diarrhea, 6-10 times a day, so far she had 6 episodes for today, as well she is having persistent nausea, but no vomiting, no coffee-ground emesis, as well she has been having poor appetite, abdominal pain, crampy, nonradiating, reports some minimal amount of blood in her stool, but report is always been like that, no NSAIDs use, no alcohol use, she is on Eliquis for A. fib, no fever, no chills, no cough, no shortness of breath, no COVID-19 exposure. -in ED patient noted to be in A. fib with RVR requiring Cardizem drip, potassium 3.2, CT abdomen and pelvis significant for left side: Colitis, TRH called for admission.   Assessment & Plan: 1-abdominal pain/diarrhea secondary to colitis -With concern for ulcerative colitis flare versus C. difficile reinfection -Continue p.o. steroids and oral vancomycin -Follow GI service recommendation -Follow GI panel and final C. Diff results -Continue supportive care, gentle fluid resuscitation and electrolyte repletion. -Patient reporting no abdominal pain currently -PPI will be discontinued.  2-Paroxysmal atrial fibrillation with RVR on presentation(HCC) -Require overnight use of Cardizem drip -Heart rate improved -Blood pressure soft -Cardizem drip currently off; patient remains in A. fib with improved rate control -Adjusted dose of beta-blocker and continue Eliquis for  anticoagulation. -Continue correcting potassium and magnesium level as needed -Continue telemetry monitoring and stepdown observation in case further drip needed.   3-hypokalemia -In the setting of GI losses -Continue repletion as needed -Magnesium within normal limits currently. -Follow electrolytes trend.  4-COPD -Continue bronchodilators as needed -Patient without any wheezing is currently.  5-anxiety/depression -Mood is stable -No suicidal ideation or hallucination -Continue home medication regiment.  6-GERD -Continue famotidine -PPI discontinued in the setting of concern for C. difficile infection.  7-hyperlipidemia -Continue statins.  8-hyperglycemia -CBGs elevated most likely in the setting of steroids usage -Will start sliding scale insulin and daily Lantus -Follow CBGs and further adjust hypoglycemic regimen as needed -Check A1c.   DVT prophylaxis: Apixaban. Code Status: Full code Family Communication: No family at bedside. Disposition Plan: Remains inpatient and in the stepdown.  Adjust metoprolol for better rate control, continue steroids and antibiotics, follow results of GI panel and recommendation by gastroenterology service.  Consultants:   GI service  Procedures:   See below for x-ray reports.  Antimicrobials:  Anti-infectives (From admission, onward)   Start     Dose/Rate Route Frequency Ordered Stop   08/19/19 1000  vancomycin (VANCOCIN) 50 mg/mL oral solution 125 mg     125 mg Oral Every 3 DAYS 07/13/19 1813 08/28/19 0959   08/11/19 1000  vancomycin (VANCOCIN) 50 mg/mL oral solution 125 mg     125 mg Oral Every other day 07/13/19 1813 08/19/19 0959   08/04/19 1000  vancomycin (VANCOCIN) 50 mg/mL oral solution 125 mg     125 mg Oral Daily 07/13/19 1813 08/11/19 0959   07/27/19 2200  vancomycin (VANCOCIN) 50 mg/mL oral solution 125 mg     125 mg Oral 2 times  daily 07/13/19 1813 08/03/19 2159   07/13/19 1815  vancomycin (VANCOCIN) 50 mg/mL oral  solution 125 mg  Status:  Discontinued     125 mg Oral Every 6 hours 07/13/19 1813 07/13/19 1818   07/13/19 1815  vancomycin (VANCOCIN) 50 mg/mL oral solution 125 mg     125 mg Oral 4 times daily 07/13/19 1813 07/27/19 1759       Subjective: Afebrile, no chest pain, no nausea, no vomiting.  Reports intermittent crampy abdominal discomfort, continue loose stools (even better) and rectal discomfort.  Patient had to come off Cardizem drip overnight secondary to soft blood pressure.  Still with atrial fibrillation but rate better controlled.  Objective: Vitals:   07/14/19 1200 07/14/19 1230 07/14/19 1300 07/14/19 1615  BP: 108/75 105/80 98/77   Pulse: 71 (!) 119 100 (!) 45  Resp: 11 10 15 15   Temp:    98.4 F (36.9 C)  TempSrc:    Oral  SpO2: 96% 96% 96% 97%  Weight:      Height:        Intake/Output Summary (Last 24 hours) at 07/14/2019 1620 Last data filed at 07/14/2019 0600 Gross per 24 hour  Intake 1337.07 ml  Output 300 ml  Net 1037.07 ml   Filed Weights   07/13/19 1400 07/13/19 2030 07/14/19 0500  Weight: 89.4 kg 81.7 kg 81.7 kg    Examination: General exam: Alert, awake, oriented x 3; reports feeling better, no chest pain, no shortness of breath, no nausea or vomiting.  Still having some loose stools and complaining of rectal discomfort. Respiratory system: Clear to auscultation. Respiratory effort normal. Cardiovascular system: irregular. No rubs or gallops. Gastrointestinal system: Abdomen is nondistended, soft and nontender currently. No organomegaly or masses felt. Normal bowel sounds heard. Central nervous system: Alert and oriented. No focal neurological deficits. Extremities: No cyanosis or clubbing. Skin: No rashes, no petechiae. Psychiatry: Judgement and insight appear normal. Mood & affect appropriate.     Data Reviewed: I have personally reviewed following labs and imaging studies  CBC: Recent Labs  Lab 07/13/19 1447 07/14/19 0537  WBC 12.0* 10.9*   NEUTROABS 7.3  --   HGB 12.8 11.3*  HCT 40.6 35.2*  MCV 93.1 92.9  PLT 138* 423   Basic Metabolic Panel: Recent Labs  Lab 07/13/19 1447 07/14/19 0537  NA 134* 138  K 3.2* 3.5  CL 96* 105  CO2 24 23  GLUCOSE 369* 295*  BUN 9 7*  CREATININE 0.79 0.65  CALCIUM 9.4 8.7*   GFR: Estimated Creatinine Clearance: 67.7 mL/min (by C-G formula based on SCr of 0.65 mg/dL).   Liver Function Tests: Recent Labs  Lab 07/13/19 1447  AST 8*  ALT 12  ALKPHOS 60  BILITOT 0.9  PROT 5.9*  ALBUMIN 3.2*   Coagulation Profile: Recent Labs  Lab 07/13/19 1447  INR 1.0   CBG: Recent Labs  Lab 07/13/19 1805  GLUCAP 160*   Urine analysis:    Component Value Date/Time   COLORURINE STRAW (A) 07/13/2019 1427   APPEARANCEUR CLEAR 07/13/2019 1427   LABSPEC 1.029 07/13/2019 1427   PHURINE 6.0 07/13/2019 1427   GLUCOSEU >=500 (A) 07/13/2019 1427   HGBUR NEGATIVE 07/13/2019 1427   BILIRUBINUR NEGATIVE 07/13/2019 1427   KETONESUR 20 (A) 07/13/2019 1427   PROTEINUR NEGATIVE 07/13/2019 1427   NITRITE NEGATIVE 07/13/2019 1427   LEUKOCYTESUR TRACE (A) 07/13/2019 1427    Recent Results (from the past 240 hour(s))  SARS CORONAVIRUS 2 (TAT 6-24 HRS) Nasopharyngeal  Nasopharyngeal Swab     Status: None   Collection Time: 07/13/19  5:08 PM   Specimen: Nasopharyngeal Swab  Result Value Ref Range Status   SARS Coronavirus 2 NEGATIVE NEGATIVE Final    Comment: (NOTE) SARS-CoV-2 target nucleic acids are NOT DETECTED. The SARS-CoV-2 RNA is generally detectable in upper and lower respiratory specimens during the acute phase of infection. Negative results do not preclude SARS-CoV-2 infection, do not rule out co-infections with other pathogens, and should not be used as the sole basis for treatment or other patient management decisions. Negative results must be combined with clinical observations, patient history, and epidemiological information. The expected result is Negative. Fact Sheet for  Patients: SugarRoll.be Fact Sheet for Healthcare Providers: https://www.woods-mathews.com/ This test is not yet approved or cleared by the [place] FDA and  has been authorized for detection and/or diagnosis of SARS-CoV-2 by FDA under an Emergency Use Authorization (EUA). This EUA will remain  in effect (meaning this test can be used) for the duration of the COVID-19 declaration under Section 56 4(b)(1) of the Act, 21 U.S.C. section 360bbb-3(b)(1), unless the authorization is terminated or revoked sooner. Performed at [facility], [place] [address] [place], [place] [address]   MRSA PCR Screening     Status: None   Collection Time: 07/13/19  7:58 PM   Specimen: Nasal Mucosa; Nasopharyngeal  Result Value Ref Range Status   MRSA by PCR NEGATIVE NEGATIVE Final    Comment:        The GeneXpert MRSA Assay (FDA approved for NASAL specimens only), is one component of a comprehensive MRSA colonization surveillance program. It is not intended to diagnose MRSA infection nor to guide or monitor treatment for MRSA infections. Performed at [facility], [address], [place], [place] [address]   C Difficile Quick Screen w PCR reflex     Status: Abnormal   Collection Time: 07/14/19 12:00 PM   Specimen: Stool  Result Value Ref Range Status   C Diff antigen POSITIVE (A) NEGATIVE Final   C Diff toxin NEGATIVE NEGATIVE Final   C Diff interpretation Results are indeterminate. See PCR results.  Final    Comment: Performed at [facility], [address], [place], [place] [address]     Radiology Studies: Ct Abdomen Pelvis W Contrast  Result Date: 07/13/2019 CLINICAL DATA:  Diarrhea for 2-3 days. Prior admission 3 weeks ago for C. difficile colitis. EXAM: CT ABDOMEN AND PELVIS WITH CONTRAST TECHNIQUE: Multidetector CT imaging of the abdomen and pelvis was performed using the standard protocol following bolus administration of intravenous  contrast. CONTRAST:  162m OMNIPAQUE IOHEXOL 300 MG/ML  SOLN COMPARISON:  05/20/2019 FINDINGS: Lower chest: Large hiatal hernia containing most of stomach. The gastroesophageal junction is well above the hiatus. There some organoaxial rotation of the herniated stomach. Upper normal heart size. Passive atelectasis associated with the large hiatal hernia. Descending thoracic aortic atherosclerotic vascular disease. Hepatobiliary: Cholecystectomy. The liver appears otherwise unremarkable. No biliary dilatation. 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 Do not directly observe an ectopic ureterocele. No scarring or hydronephrosis. The right kidney appears unremarkable. Adrenal glands normal. Stomach/Bowel: As noted above most of stomach is herniated into the chest. Small periampullary duodenal diverticulum. There is abnormal wall thickening and mucosal enhancement in the cecum and ascending colon. Distal descending and sigmoid colon diverticulosis with very faint adjacent stranding in the  mesentery, less than on 05/20/2019, but still potentially manifestation of low-grade diverticulitis. Mildly accentuated mucosal enhancement in the rectum with small rectal air fluid levels. Tiny localized amount of gas adjacent the terminal ileum on image 80/6 which is probably in a small diverticulum, similar appearance suggested on image 47/2 of 05/20/2019 exam. Scattered nondilated jejunal loops with air-fluid levels. Vascular/Lymphatic: Aortoiliac atherosclerotic vascular disease. Widely patent celiac trunk SMA. Patent IMA. Reproductive: Unremarkable Other: No supplemental non-categorized findings. Musculoskeletal: Small periumbilical hernia mesh. Lower thoracic and lumbar spondylosis and degenerative disc disease. IMPRESSION: 1. Abnormal mucosal enhancement wall thickening the  cecum and ascending colon favoring colitis. There is some questionable proximal sigmoid diverticulitis, as well as accentuated mucosa enhancement in the sigmoid colon and rectum. The appearance is most compatible with colitis perhaps very low-grade proximal diverticulitis. Favor infectious colitis or inflammatory bowel disease over ischemic colitis given the wide patency of the mesenteric vessels. Given the patient's history, correlation with C difficile toxins is recommended to exclude recurrent C difficile colitis. 2. Large hiatal hernia with some organoaxial rotation of the herniated stomach. 3. 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 4.  Aortic Atherosclerosis (ICD10-I70.0). Electronically Signed   By: Van Clines M.D.   On: 07/13/2019 16:56    Scheduled Meds: . acidophilus  1 capsule Oral Daily  . apixaban  5 mg Oral BID  . azelastine  2 spray Each Nare BID  . balsalazide  2,250 mg Oral TID  . calcium-vitamin D  1 tablet Oral BID  . Chlorhexidine Gluconate Cloth  6 each Topical Daily  . dicyclomine  10 mg Oral BID AC  . famotidine  20 mg Oral BID  . insulin aspart  0-15 Units Subcutaneous TID WC  . insulin aspart  0-5 Units Subcutaneous QHS  . ketotifen  1 drop Both Eyes Daily  . metoprolol tartrate  5 mg Intravenous Once  . metoprolol tartrate  75 mg Oral BID  . mometasone-formoterol  2 puff Inhalation BID  . pravastatin  20 mg Oral Daily  . predniSONE  40 mg Oral Q breakfast  . pregabalin  100 mg Oral BID  . traZODone  100 mg Oral QHS  . vancomycin  125 mg Oral QID   Followed by  . [START ON 07/27/2019] vancomycin  125 mg Oral BID   Followed by  . [START ON 08/04/2019] vancomycin  125 mg Oral Daily   Followed by  . [START ON 08/11/2019] vancomycin  125 mg Oral QODAY   Followed by  . [START ON 08/19/2019] vancomycin  125 mg Oral Q3 days   Continuous Infusions: . 0.9 % NaCl with KCl 20 mEq / L 50 mL/hr at 07/14/19 1507  . diltiazem  (CARDIZEM) infusion Stopped (07/13/19 2330)     LOS: 0 days    Time spent: 35 minutes. Greater than 50% of this time was spent in direct contact with the patient, coordinating care and discussing relevant ongoing clinical issues, including ongoing presentation of colitis; concerns for UC flare versus C. Difficile.  Patient also mildly dehydrated, with ongoing electrolytes improvement from GI losses and A. fib with RVR on presentation requiring Cardizem drip.     Barton Dubois, MD [facility] Pager [phone]   07/14/2019, 4:20 PM

## 2019-07-14 NOTE — Progress Notes (Addendum)
Patient's BP approximately 80/55 when asleep, increases to 105/70 if woken up. Heart rate in 70's and 80's. Cardizem drip on hold d/t blood pressure drop, mid level aware.

## 2019-07-14 NOTE — Progress Notes (Signed)
Around 1700, MD made aware that CBG above 521 and HR in the 100-110s, getting as high as 120s and sometimes 130s. 1 time dose of metoprolol given, see MAR for insulin dosages given.   Around 1805, CBG is 447 and HR still getting into the 120s. MD made aware. See MAR for insulin orders given. Will continue to monitor HR and CBG.  Celestia Khat, RN

## 2019-07-14 NOTE — Consult Note (Signed)
Referring Provider: No ref. provider found Primary Care Physician:  Riley Lam Primary Gastroenterologist:  Dr. Gala Romney  Date of Admission: 07/13/2019 Date of Consultation: 07/14/2019  Reason for Consultation:  Recurrent colitis  HPI:  [name] is a 68 y.o. female with a past medical history of anemia, A. fib, COPD, diabetes, diverticulitis, GERD, hiatal hernia.  She recently had a prolonged admission from 05/20/2019 through 06/15/2019 for significant, prolonged colitis.  She does have a history of ulcerative colitis.  She presented with C. difficile positive diarrhea, abdominal pain, diffuse pancolitis, diarrhea.  She did not respond to steroid enemas.  She was initially treated with Cipro and Flagyl which was discontinued and transitioned to oral vancomycin with a 3-week taper.  At discharge she had instructions for prolonged taper.  Flex sig on 06/01/2019 with findings of severe pancolitis and biopsies demonstrating severe active chronic colitis with erosions consistent with ulcerative colitis.  She was started on Entocort and hydrocortisone enemas without improvement.  She was subsequently transitioned to IV Solu-Medrol.  She was discharged home with a prednisone taper and continued vancomycin taper.  Transient A. fib though generally in sinus rhythm.  She is on chronic anticoagulation with Eliquis for her A. Fib.  Reviewed discharge instructions and she was discharged on prednisone 10 mg with a taper over the course of 7 weeks.  She was also discharged on oral vancomycin for 2 additional weeks.  Reviewed ER PA and physician notes.  Hospitalist note also reviewed.  Patient presented yesterday with diarrhea and generalized weakness.  Noted multiple bowel movements over the last 3 days as many as 6-10 times a day.  Persistent nausea as well.  Some blood in her stools but "this is there all the time."  CBC demonstrated white blood cell count of 12.0, baseline hemoglobin.  Per the ER note the  patient did not start oral vancomycin or steroids at discharge and they claim they were unaware they were supposed to.  However conflicting report from the hospitalist that indicates the patient was compliant with discharge regimen. Last C. difficile antigen and toxin test on 06/10/2019 was negative.  Recommended C. difficile and GI pathogen panel testing but the patient did not provide a bowel movement in the ER to collect.  The patient was subsequently admitted for further evaluation and treatment.  CT of the abdomen and pelvis demonstrated abnormal mucosal enhancement and wall thickening of the cecum and ascending colon favoring colitis, questionable proximal sigmoid diverticulitis as well as accentuated mucosa enhancement in the sigmoid colon and rectum most compatible with colitis perhaps a very low-grade proximal diverticulitis.  Favor infectious colitis or inflammatory bowel disease or ischemic colitis given wide patency of mesenteric vessels.  In the emergency department the patient was noted to be in A. fib with RVR requiring Cardizem drip and she was subsequently admitted to ICU/stepdown.  Hospitalist indicates C. difficile colitis versus ulcerative colitis, again recommended C. difficile stool sample and GI pathogen panel.  She was placed on oral vancomycin empirically and if C. difficile positive may need to switch to Dificid.  The patient was also placed on prednisone 40 mg oral daily for presumed ulcerative colitis flare.  GI was consulted.  She was admitted to the ICU due to A. fib with RVR and heart rate in the 160s.  Today she states she's doing better today. Her husband is at bedside. Denies any vomiting with her nausea. Abdominal pain significantly improved, no pain this morning. Has only had 1  stool since coming to the hospital which was in the ER and was "mushy" but not watery diarrhea. Nausea improved as well. States SHE DID continue Vancomycin and prednisone at discharge; is also on her  chronic Colazal. Has seen a little bit of hematochezia, though no worse then her baseline. Her rectum is raw/sore due to the frequent diarrhea. She is tolerating her diet well. No other GI concerns.  Past Medical History:  Diagnosis Date  . Anemia   . Anginal pain ([place])    PATIENT STATES DOCT THINKS IS FIBROMYALGIA  . Arthritis   . Asthma   . Atrial fibrillation ([place])   . Blood clot in lung last march  . Cancer ([place])    1980  CERVICAL  . Chronic back pain   . COPD (chronic obstructive pulmonary disease) ([place])   . DDD (degenerative disc disease)   . Degenerative tear of posterior horn of lateral meniscus of right knee 01/27/2014  . Depression   . Diabetes mellitus without complication ([place])   . Diverticulitis   . Fibromyalgia   . GERD (gastroesophageal reflux disease)   . Headache    HX MIGRAINES    . Hiatal hernia   . HOH (hard of hearing)    right  . Pancreatitis   . Pneumonia    1 YR  . PONV (postoperative nausea and vomiting)    only happened after knee arthroscopy  . Primary localized osteoarthritis of right knee 12/11/2015  . PVC's (premature ventricular contractions)   . Schatzki's ring   . Seizures ([place])    had febrile seizures as child; none since childhood and on no meds  . Shortness of breath dyspnea    WITH EXERTION   . Sleep apnea    CPAP at night  . Ulcerative colitis 2007  . Wears dentures    full top-partial bottom    Past Surgical History:  Procedure Laterality Date  . arch and foot    . BALLOON DILATION  11/17/2018   Procedure: BALLOON DILATION;  Surgeon: Daneil Dolin, MD;  [facility];  Service: Endoscopy;;  esophagus  . BIOPSY  06/01/2019   Procedure: BIOPSY;  Surgeon: Daneil Dolin, MD;  [facility];  Service: Endoscopy;;  . CERVICAL CONE BIOPSY    . CHOLECYSTECTOMY    . COLONOSCOPY  12/03/05   Rourk-marked inflammatory changes of the rectum, left colon, pan colitis, internal hemorrhoids, sigmoid diverticula  .  COLONOSCOPY N/A 10/20/2013   Dr. Rourk:Colonic diverticulosis. colonic mucosal ulcerations involving segment of sigmoid colon, atypical for UC. sigmoid colon path with chronic mildly active colitis consistent with IBD, other biopsies including ascending, transverse, descending colon and rectum unremarkable.   . COLONOSCOPY N/A 11/19/2016   Dr. Gala Romney: Diverticulosis, 7 millimeter polyp removed from the descending colon, internal hemorrhoids, segmental biopsies unremarkable.  Marland Kitchen DILATION AND CURETTAGE OF UTERUS    . ESOPHAGEAL DILATION N/A 09/20/2015   Procedure: ESOPHAGEAL DILATION;  Surgeon: Daneil Dolin, MD;  [facility];  Service: Endoscopy;  Laterality: N/A;  . ESOPHAGOGASTRODUODENOSCOPY  01/28/2011   Rourk- Schatzki ring, s/p 49F, otherwise normal/Hiatal hernia otherwise normal stomach D1 and D2  . ESOPHAGOGASTRODUODENOSCOPY  11/30/08   Rourk-Schatzki's ring status post dilation, hiatal hernia  . ESOPHAGOGASTRODUODENOSCOPY  07/12/2007   Dilation with 56 French, Schatzki's ring  . ESOPHAGOGASTRODUODENOSCOPY N/A 08/10/2014   Dr.Rourk- prominent schatzki's ring s/p maloney dilation and disruption, moderate sized hiatal hernia  . ESOPHAGOGASTRODUODENOSCOPY N/A 09/20/2015   RMR: [employer]  ring status post dilation as described above. Rather large hiatal hernia; somewhat baggy atonic esophageal body' otherwise normal EGD  . ESOPHAGOGASTRODUODENOSCOPY N/A 11/19/2016   Dr. Gala Romney: moderate Schatzki ring s/p dirsuption with biopsy forceps, large hiatal hernia  . ESOPHAGOGASTRODUODENOSCOPY N/A 11/17/2018   Procedure: ESOPHAGOGASTRODUODENOSCOPY (EGD);  Surgeon: Daneil Dolin, MD;  [facility];  Service: Endoscopy;  Laterality: N/A;  12:00PM  . ESOPHAGOGASTRODUODENOSCOPY (EGD) WITH ESOPHAGEAL DILATION  09/01/2012   Dr. Gala Romney- schatzki's ring, hiatal hernia  . ESOPHAGOGASTRODUODENOSCOPY (EGD) WITH PROPOFOL N/A 02/08/2018   Procedure: ESOPHAGOGASTRODUODENOSCOPY (EGD) WITH PROPOFOL;   Surgeon: Daneil Dolin, MD;  [facility];  Service: Endoscopy;  Laterality: N/A;  10:30am  . FLEXIBLE SIGMOIDOSCOPY N/A 06/01/2019   Procedure: FLEXIBLE SIGMOIDOSCOPY;  Surgeon: Daneil Dolin, MD;  [facility];  Service: Endoscopy;  Laterality: N/A;  . FOOT ARTHRODESIS  2007,2010   both feet  . HERNIA REPAIR     umbilical  . JOINT REPLACEMENT Right   . KNEE ARTHROSCOPY WITH LATERAL MENISECTOMY Right 01/27/2014   Procedure: RIGHT KNEE ARTHROSCOPY WITH PARTIAL LATERAL MENISECTOMY, ;  Surgeon: Johnny Bridge, MD;  Location: [facility];  Service: Orthopedics;  Laterality: Right;  . MALONEY DILATION N/A 08/10/2014   Procedure: [name] DILATION;  Surgeon: Daneil Dolin, MD;  [facility];  Service: Endoscopy;  Laterality: N/A;  [name] DILATION N/A 11/19/2016   Procedure: [name] DILATION;  Surgeon: Daneil Dolin, MD;  [facility];  Service: Endoscopy;  Laterality: N/A;  . [name] DILATION N/A 02/08/2018   Procedure: [name] DILATION;  Surgeon: Daneil Dolin, MD;  [facility];  Service: Endoscopy;  Laterality: N/A;  [name] DILATION N/A 11/17/2018   Procedure: [name] DILATION;  Surgeon: Daneil Dolin, MD;  [facility];  Service: Endoscopy;  Laterality: N/A;  . SAVORY DILATION N/A 08/10/2014   Procedure: SAVORY DILATION;  Surgeon: Daneil Dolin, MD;  [facility];  Service: Endoscopy;  Laterality: N/A;  . SHOULDER OPEN ROTATOR CUFF REPAIR     BILAT  . TONSILLECTOMY    . TOTAL KNEE ARTHROPLASTY Right 12/11/2015   Procedure: TOTAL KNEE ARTHROPLASTY;  Surgeon: Marchia Bond, MD;  Location: [facility];  Service: Orthopedics;  Laterality: Right;    Prior to Admission medications   Medication Sig Start Date End Date Taking? Authorizing Provider  acetaminophen (TYLENOL) 500 MG tablet Take 1,000 mg by mouth every 6 (six) hours as needed for moderate pain or headache.   Yes [provider]   albuterol (PROVENTIL) (2.5 MG/3ML) 0.083% nebulizer solution Take 2.5 mg by nebulization every 6 (six) hours as needed for wheezing or shortness of breath.   Yes [provider]  albuterol (PROVENTIL,VENTOLIN) 90 MCG/ACT inhaler Inhale 2 puffs into the lungs every 6 (six) hours as needed for wheezing or shortness of breath.    Yes [provider]  azelastine (ASTELIN) 137 MCG/SPRAY nasal spray 2 sprays by Nasal route 2 (two) times daily. Use in each nostril as directed    Yes [provider]  balsalazide (COLAZAL) 750 MG capsule Take 3 capsules (2,250 mg total) by mouth 3 (three) times daily. 06/15/19  Yes Johnson, Clanford L, MD  buPROPion (WELLBUTRIN XL) 150 MG 24 hr tablet Take 150 mg by mouth daily. 10/09/15  Yes [provider]  busPIRone (BUSPAR) 15 MG tablet Take 7.5 mg by mouth 2 (two) times daily. 09/01/18  Yes [provider]  Calcium Carb-Cholecalciferol (CALCIUM-VITAMIN D) 600-400 MG-UNIT TABS Take 1 tablet by mouth 2 (two) times daily.   Yes [provider]  cetirizine (ZYRTEC) 10 MG tablet Take 10 mg by mouth daily.   Yes [provider]  dicyclomine (BENTYL) 10 MG capsule Take 1 capsule (10 mg total) by mouth 2 (two) times daily before a meal. 06/15/19  Yes Johnson, Clanford L, MD  DULoxetine (CYMBALTA) 30 MG capsule Take 120 mg by mouth daily.    Yes [provider]  ELIQUIS 5 MG TABS tablet Take 1 tablet (5 mg total) by mouth 2 (two) times daily. 02/25/19  Yes Arnoldo Lenis, MD  esomeprazole (NEXIUM) 40 MG capsule Take 1 capsule (40 mg total) by mouth 2 (two) times daily before a meal. 04/07/19  Yes Mahala Menghini, PA-C  famotidine-calcium carbonate-magnesium hydroxide (PEPCID COMPLETE) 10-800-165 MG CHEW chewable tablet Chew 1 tablet by mouth daily as needed (for breakthrough GERD).    Yes [provider]  ferrous sulfate 325 (65 FE) MG tablet Take 325 mg by mouth daily with breakfast.   Yes [provider]  fluticasone (FLONASE) 50 MCG/ACT nasal spray Place 2 sprays into both nostrils daily. 04/07/19  Yes Kozlow, Donnamarie Poag, MD  Fluticasone-Salmeterol (ADVAIR DISKUS) 500-50 MCG/DOSE AEPB Inhale one puff twice daily 04/07/19  Yes Kozlow, Donnamarie Poag, MD  ketotifen (ALAWAY) 0.025 % ophthalmic solution Place 1 drop into both eyes daily.    Yes [provider]  lidocaine (LIDODERM) 5 % Place 1-2 patches onto the skin daily as needed (pain). Remove & Discard patch within 12 hours or as directed by MD   Yes [provider]  metoprolol tartrate (LOPRESSOR) 50 MG tablet Take 1 tablet (50 mg total) by mouth 2 (two) times daily. 06/15/19 07/15/19 Yes Johnson, Clanford L, MD  pravastatin (PRAVACHOL) 20 MG tablet TAKE 1 TABLET DAILY 04/05/19  Yes Branch, Alphonse Guild, MD  predniSONE (DELTASONE) 10 MG tablet Prednisone 60 mg daily x 2 weeks followed by a taper of 50 mg x 1 week, 40 mg x 1 week, 30 mg x 1 week, 20 mg x 1 week, 15 mg x 1 week, 10 mg x 1 week, 5 mg x 1 week, then stop prednisone.  Please give sufficient quantity of tablets 06/15/19  Yes Johnson, Clanford L, MD  pregabalin (LYRICA) 100 MG capsule Take 100 mg by mouth 2 (two) times daily.   Yes [provider]  traZODone (DESYREL) 100 MG tablet Take 100 mg by mouth at bedtime.     Yes [provider]  vancomycin (VANCOCIN) 50 mg/mL oral solution Take 5 mL daily through 9/20, then 5 mL QOD for one week, then 5 mL every 3 days for one week. 06/15/19  Yes Murlean Iba, MD  FREESTYLE LITE test strip  01/17/19   [provider]    Current Facility-Administered Medications  Medication Dose Route Frequency Provider Last Rate Last Dose  . 0.9 % NaCl with KCl 20 mEq/ L  infusion   Intravenous Continuous Elgergawy, Silver Huguenin, MD 50 mL/hr at 07/13/19 2044    . acetaminophen (TYLENOL) tablet 650 mg  650 mg Oral Q6H PRN Elgergawy, Silver Huguenin, MD   650 mg at 07/13/19 2301   Or  . acetaminophen (TYLENOL) suppository 650 mg   650 mg Rectal Q6H PRN Elgergawy, Silver Huguenin, MD      . acidophilus (RISAQUAD) capsule 1 capsule  1 capsule Oral Daily Elgergawy, Silver Huguenin, MD  1 capsule at 07/13/19 2134  . albuterol (PROVENTIL) (2.5 MG/3ML) 0.083% nebulizer solution 2.5 mg  2.5 mg Nebulization Q6H PRN Elgergawy, Silver Huguenin, MD      . apixaban (ELIQUIS) tablet 5 mg  5 mg Oral BID Elgergawy, Silver Huguenin, MD   5 mg at 07/13/19 2131  . azelastine (ASTELIN) 0.1 % nasal spray 2 spray  2 spray Each Nare BID Elgergawy, Silver Huguenin, MD   2 spray at 07/13/19 2132  . balsalazide (COLAZAL) capsule 2,250 mg  2,250 mg Oral TID Elgergawy, Silver Huguenin, MD      . calcium-vitamin D (OSCAL WITH D) 500-200 MG-UNIT per tablet 1 tablet  1 tablet Oral BID Elgergawy, Silver Huguenin, MD   1 tablet at 07/13/19 2131  . Chlorhexidine Gluconate Cloth 2 % PADS 6 each  6 each Topical Daily Elgergawy, Silver Huguenin, MD   6 each at 07/13/19 2045  . dicyclomine (BENTYL) capsule 10 mg  10 mg Oral BID AC Elgergawy, Silver Huguenin, MD      . diltiazem (CARDIZEM) 125 mg in dextrose 5% 125 mL (1 mg/mL) infusion  5-15 mg/hr Intravenous Continuous Elgergawy, Silver Huguenin, MD   Stopped at 07/13/19 2330  . famotidine (PEPCID) tablet 20 mg  20 mg Oral BID Barton Dubois, MD      . ketotifen (ZADITOR) 0.025 % ophthalmic solution 1 drop  1 drop Both Eyes Daily Elgergawy, Silver Huguenin, MD      . metoprolol tartrate (LOPRESSOR) tablet 50 mg  50 mg Oral BID Elgergawy, Silver Huguenin, MD   50 mg at 07/13/19 2131  . mometasone-formoterol (DULERA) 200-5 MCG/ACT inhaler 2 puff  2 puff Inhalation BID Elgergawy, Silver Huguenin, MD   2 puff at 07/14/19 0855  . pravastatin (PRAVACHOL) tablet 20 mg  20 mg Oral Daily Elgergawy, Silver Huguenin, MD   20 mg at 07/13/19 2135  . predniSONE (DELTASONE) tablet 40 mg  40 mg Oral Q breakfast Elgergawy, Silver Huguenin, MD   40 mg at 07/13/19 1842  . pregabalin (LYRICA) capsule 100 mg  100 mg Oral BID Elgergawy, Silver Huguenin, MD   100 mg at 07/13/19 2131  . traZODone (DESYREL) tablet 100 mg  100 mg Oral QHS  Elgergawy, Silver Huguenin, MD   100 mg at 07/13/19 2131  . vancomycin (VANCOCIN) 50 mg/mL oral solution 125 mg  125 mg Oral QID Elgergawy, Silver Huguenin, MD   125 mg at 07/13/19 2132   Followed by  . [START ON 07/27/2019] vancomycin (VANCOCIN) 50 mg/mL oral solution 125 mg  125 mg Oral BID Elgergawy, Silver Huguenin, MD       Followed by  . [START ON 08/04/2019] vancomycin (VANCOCIN) 50 mg/mL oral solution 125 mg  125 mg Oral Daily Elgergawy, Silver Huguenin, MD       Followed by  . [START ON 08/11/2019] vancomycin (VANCOCIN) 50 mg/mL oral solution 125 mg  125 mg Oral QODAY Elgergawy, Silver Huguenin, MD       Followed by  . [START ON 08/19/2019] vancomycin (VANCOCIN) 50 mg/mL oral solution 125 mg  125 mg Oral Q3 days Elgergawy, Silver Huguenin, MD        Allergies as of 07/13/2019 - Review Complete 07/13/2019  Allergen Reaction Noted  . Mesalamine Other (See Comments)   . Cefprozil Other (See Comments)   . Cefuroxime axetil Other (See Comments) 08/08/2008  . Morphine and related Hives and Itching 08/23/2012  . Penicillins Hives 08/08/2008  . Eggs or egg-derived products  05/30/2019  . Turkey-sweet  potatoes-peaches [alitraq]  05/30/2019  . Aspirin Other (See Comments)   . Chicken allergy Nausea And Vomiting and Rash 11/27/2015  . Clarithromycin Nausea And Vomiting 08/08/2008  . Codeine Other (See Comments)   . Entex Other (See Comments) 08/08/2008    Family History  Problem Relation Age of Onset  . Heart disease Mother   . Colitis Mother   . Atrial fibrillation Sister   . Arrhythmia Brother   . Colon cancer Neg Hx     Social History   Socioeconomic History  . Marital status: Married    Spouse name: Not on file  . Number of children: 3  . Years of education: Not on file  . Highest education level: Not on file  Occupational History  . 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 Financial resource strain: Not hard at all  . Food insecurity    Worry: Never true    Inability: Never true  .  Transportation needs    Medical: No    Non-medical: No  Tobacco Use  . Smoking status: Former Smoker    Packs/day: 1.00    Years: 30.00    Pack years: 30.00    Types: Cigarettes    Start date: 07/07/1969    Quit date: 08/30/2011    Years since quitting: 7.8  . Smokeless tobacco: Never Used  Substance and Sexual Activity  . Alcohol use: No    Alcohol/week: 0.0 standard drinks  . Drug use: No  . Sexual activity: Yes    Partners: Male    Birth control/protection: None    Comment: spouse  Lifestyle  . Physical activity    Days per week: 0 days    Minutes per session: 0 min  . Stress: To some extent  Relationships  . Social Herbalist on phone: Once a week    Gets together: Once a week    Attends religious service: Never    Active member of club or organization: No    Attends meetings of clubs or organizations: Never    Relationship status: Married  . Intimate partner violence    Fear of current or ex partner: No    Emotionally abused: No    Physically abused: No    Forced sexual activity: No  Other Topics Concern  . Not on file  Social History Narrative  . Not on file    Review of Systems: General: Negative for anorexia, weight loss, fever, chills, fatigue, weakness. ENT: Negative for hoarseness, difficulty swallowing. CV: Negative for chest pain, angina, palpitations, peripheral edema.  Respiratory: Negative for dyspnea at rest, cough, sputum, wheezing.  GI: See history of present illness. MS: Negative for joint pain, low back pain.  Derm: Negative for rash or itching.   Endo: Negative for unusual weight change.  Heme: Negative for bruising or bleeding. Allergy: Negative for rash or hives.  Physical Exam: Vital signs in last 24 hours: Temp:  [97.9 F (36.6 C)-98.5 F (36.9 C)] 97.9 F (36.6 C) (10/15 0748) Pulse Rate:  [70-157] 125 (10/15 1000) Resp:  [11-28] 17 (10/15 1000) BP: (75-140)/(44-98) 118/83 (10/15 1000) SpO2:  [92 %-100 %] 98 % (10/15  1000) Weight:  [81.7 kg-89.4 kg] 81.7 kg (10/15 0500) Last BM Date: 07/13/19 General:   Alert,  Well-developed, well-nourished, pleasant and cooperative in NAD Head:  Normocephalic and atraumatic. Eyes:  Sclera clear, no icterus. Conjunctiva pink. Ears:  Normal auditory acuity. Neck:  Supple; no masses or thyromegaly. Lungs:  Clear throughout to auscultation. No wheezes, crackles, or rhonchi. No acute distress. Heart:  Irregularly irregular rate and rhythm (AFib on tele, HR 315-945); no murmurs, clicks, rubs,  or gallops. Abdomen:  Soft, nontender and nondistended. No masses, hepatosplenomegaly or hernias noted. Normal bowel sounds, without guarding, and without rebound.   Rectal:  Deferred.   Msk:  Symmetrical without gross deformities. Pulses:  Normal pulses noted. Extremities:  Without clubbing or edema. Neurologic:  Alert and  oriented x4;  grossly normal neurologically. Psych:  Alert and cooperative. Normal mood and affect.  Intake/Output from previous day: 10/14 0701 - 10/15 0700 In: 1337.1 [I.V.:337.1; IV Piggyback:1000] Out: 300 [Urine:300] Intake/Output this shift: No intake/output data recorded.  Lab Results: Recent Labs    07/13/19 1447 07/14/19 0537  WBC 12.0* 10.9*  HGB 12.8 11.3*  HCT 40.6 35.2*  PLT 138* 274   BMET Recent Labs    07/13/19 1447 07/14/19 0537  NA 134* 138  K 3.2* 3.5  CL 96* 105  CO2 24 23  GLUCOSE 369* 295*  BUN 9 7*  CREATININE 0.79 0.65  CALCIUM 9.4 8.7*   LFT Recent Labs    07/13/19 1447  PROT 5.9*  ALBUMIN 3.2*  AST 8*  ALT 12  ALKPHOS 60  BILITOT 0.9   PT/INR Recent Labs    07/13/19 1447  LABPROT 13.1  INR 1.0   Hepatitis Panel No results for input(s): HEPBSAG, HCVAB, HEPAIGM, HEPBIGM in the last 72 hours. C-Diff No results for input(s): CDIFFTOX in the last 72 hours.  Studies/Results: Ct Abdomen Pelvis W Contrast  Result Date: 07/13/2019 CLINICAL DATA:  Diarrhea for 2-3 days. Prior admission 3 weeks ago  for C. difficile colitis. EXAM: CT ABDOMEN AND PELVIS WITH CONTRAST TECHNIQUE: Multidetector CT imaging of the abdomen and pelvis was performed using the standard protocol following bolus administration of intravenous contrast. CONTRAST:  137m OMNIPAQUE IOHEXOL 300 MG/ML  SOLN COMPARISON:  05/20/2019 FINDINGS: Lower chest: Large hiatal hernia containing most of stomach. The gastroesophageal junction is well above the hiatus. There some organoaxial rotation of the herniated stomach. Upper normal heart size. Passive atelectasis associated with the large hiatal hernia. Descending thoracic aortic atherosclerotic vascular disease. Hepatobiliary: Cholecystectomy. The liver appears otherwise unremarkable. No biliary dilatation. 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 Do not directly observe an ectopic ureterocele. No scarring or hydronephrosis. The right kidney appears unremarkable. Adrenal glands normal. Stomach/Bowel: As noted above most of stomach is herniated into the chest. Small periampullary duodenal diverticulum. There is abnormal wall thickening and mucosal enhancement in the cecum and ascending colon. Distal descending and sigmoid colon diverticulosis with very faint adjacent stranding in the mesentery, less than on 05/20/2019, but still potentially manifestation of low-grade diverticulitis. Mildly accentuated mucosal enhancement in the rectum with small rectal air fluid levels. Tiny localized amount of gas adjacent the terminal ileum on image 80/6 which is probably in a small diverticulum, similar appearance suggested on image 47/2 of 05/20/2019 exam. Scattered nondilated jejunal loops with air-fluid levels. Vascular/Lymphatic: Aortoiliac atherosclerotic vascular disease. Widely patent celiac trunk SMA. Patent IMA. Reproductive: Unremarkable Other: No  supplemental non-categorized findings. Musculoskeletal: Small periumbilical hernia mesh. Lower thoracic and lumbar spondylosis and degenerative disc disease. IMPRESSION: 1. Abnormal mucosal enhancement wall thickening the cecum and ascending colon favoring colitis. There is some questionable proximal sigmoid diverticulitis, as well as accentuated mucosa enhancement in the sigmoid colon  and rectum. The appearance is most compatible with colitis perhaps very low-grade proximal diverticulitis. Favor infectious colitis or inflammatory bowel disease over ischemic colitis given the wide patency of the mesenteric vessels. Given the patient's history, correlation with C difficile toxins is recommended to exclude recurrent C difficile colitis. 2. Large hiatal hernia with some organoaxial rotation of the herniated stomach. 3. 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 4.  Aortic Atherosclerosis (ICD10-I70.0). Electronically Signed   By: Van Clines M.D.   On: 07/13/2019 16:56    Impression: Pleasant 68 year old female well-known to our practice with a complicated colitis picture.  She was recently admitted for prolonged period of time for CT documented pancolitis.  C. difficile was positive at that time and she was treated with vancomycin as well as [place] (for chronic ulcerative colitis medication).  Because of nonimprovement she underwent flexible sigmoidoscopy with biopsies which demonstrated severe active ulcerative colitis.  She was tried on multiple medications and eventually IV steroids which was transitioned to oral prednisone.  She was discharged on vancomycin and prednisone after she began having improvement in her stools.  She continued her medications based on what was recommended at discharge.  Today she presents with 3-day history of diarrhea, nausea, abdominal pain.  CT again documents colitis although it seems to be less global and more limited to  cecum/ascending colon as well as proximal sigmoid colon.  She remains on vancomycin and steroids in the hospital.  She was also on this outside the hospital.  Today she seems significantly improved with resolution (at least temporarily) of her abdominal pain.  No further nausea.  Only 1 bowel movement since admission in the ER which was "pasty" and not liquid diarrhea.  Baseline scant/minimal hematochezia.  She initially had elevated white blood cell count of 12.0 which raises suspicion for recurrent infectious colitis, although it could be prednisone effect although her white blood cell count just prior to discharge, while also on steroids, was 9.9.  Today is down to 10.9 after starting antibiotics.  Her hemoglobin today is 11.3 which is baseline.  It appears her C. difficile and GI pathogen panel samples have been collected at some point this morning.  They are still pending.  Overall query recurrent infectious versus inflammatory colitis or, as last time, possible element of both.  Symptomatically she has improved.  Her vitals are stable at the moment.  Her labs look good.  She is in ICU because of A. fib with RVR initially in the 160s but her heart rate is improved to 105-115.  She had to come off Cardizem drip due to blood pressure.  I called and spoke with Dr. Sabra Heck (Radiologist) to compare CT scans. Overall looks improved. Cecum wasn't involved last time and seems to be the most prominent area of focus now. Other previous areas of mucosal enhancement and wall thickening look improved.  Given this, possible protracted, slow to improve UC/CDiff colitis. Possible recurrence in the cecum, less likely treatment failure but we will follow stool studies.  Plan: 1. Anusol cream bid prn rectal discomfort 2. Continued vancomycin 3. Continued Steroids 4. Follow for stool studies 5. Monitor for worsening abdominal pain, diarrhea 6. If CDiff PCR/toxin are positive, may need to consider change in  antibiotics. 7. Supportive measures.   Thank you for allowing [place] to participate in the care of [place], DNP, AGNP-C Adult & Gerontological Nurse Practitioner [facility]    LOS: 0 days  07/14/2019, 10:22 AM

## 2019-07-14 NOTE — TOC Initial Note (Signed)
Transition of Care [facility]) - Initial/Assessment Note    Patient Details  Name: [name] MRN: [id number] Date of Birth: [birth year]  Transition of [facility]) CM/SW Contact:    Ihor Gully, LCSW Phone Number: 07/14/2019, 4:18 PM  Clinical Narrative:                 Patient admitted for ulcerative colitis, diarrhea and colitis. Patient known to [facility] due to chronic condition.  Patient assessed for needs as she has a high readmission score.  Patient from home, independent, uses cane. Lives with husband. No current home health. Still drives. No issues obtaining medications. TOC will follow for needs.       Expected Discharge Plan: Home/Self Care Barriers to Discharge: Continued Medical Work up   Patient Goals and CMS Choice Patient states their goals for this hospitalization and ongoing recovery are:: To feel better and return home.      Expected Discharge Plan and Services Expected Discharge Plan: Home/Self Care     Post Acute Care Choice: Durable Medical Equipment Living arrangements for the past 2 months: Single Family Home                                      Prior Living Arrangements/Services Living arrangements for the past 2 months: Single Family Home Lives with:: Spouse Patient language and need for interpreter reviewed:: Yes        Need for Family Participation in Patient Care: Yes (Comment) Care giver support system in place?: Yes (comment)   Criminal Activity/Legal Involvement Pertinent to Current Situation/Hospitalization: No - Comment as needed  Activities of Daily Living Home Assistive Devices/Equipment: Environmental consultant (specify type), Wheelchair, Radio producer (specify quad or straight), Eyeglasses ADL Screening (condition at time of admission) Patient's cognitive ability adequate to safely complete daily activities?: Yes Is the patient deaf or have difficulty hearing?: No Does the patient have difficulty seeing, even when wearing glasses/contacts?:  No Does the patient have difficulty concentrating, remembering, or making decisions?: No Patient able to express need for assistance with ADLs?: Yes Does the patient have difficulty dressing or bathing?: No Independently performs ADLs?: Yes (appropriate for developmental age) Does the patient have difficulty walking or climbing stairs?: No Weakness of Legs: None Weakness of Arms/Hands: None  Permission Sought/Granted Permission sought to share information with : PCP                Emotional Assessment Appearance:: Appears stated age   Affect (typically observed): Appropriate Orientation: : Oriented to Self, Oriented to Place, Oriented to  Time, Oriented to Situation Alcohol / Substance Use: Not Applicable Psych Involvement: No (comment)  Admission diagnosis:  Diarrhea of presumed infectious origin [R19.7] Colitis [K52.9] Nausea [R11.0] Paroxysmal atrial fibrillation ([place]) [I48.0] Patient Active Problem List   Diagnosis Date Noted  . Nausea   . Paroxysmal atrial fibrillation (HCC)   . Hematochezia   . IBD (inflammatory bowel disease)   . Tachycardia   . Enteritis due to Clostridium difficile   . Colitis 05/21/2019  . IDA (iron deficiency anemia) 08/11/2018  . Diarrhea 01/06/2018  . Primary localized osteoarthritis of right knee 12/11/2015  . 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  . Hiatal hernia   . Schatzki's ring   . Degenerative tear of posterior horn of lateral meniscus of right knee 01/27/2014  . Ulcerative colitis ([place]) 01/09/2011  . Dysphagia 01/09/2011  . CONSTIPATION 08/01/2009  .  LLQ pain 08/01/2009  . GERD 06/14/2009  . ULCERATIVE COLITIS--UNIVERSAL ULCERATIVE COLITIS 06/14/2009   PCP:  Jalene Mullet, PA-C Pharmacy:   [place], [place] [address] [place] [address] Phone: [phone] Fax: [phone]  [facility] [place], [place] [address] [place] [address] Phone: [phone] Fax: [phone]     Social Determinants of Health ([place]) Interventions    Readmission Risk Interventions Readmission Risk Prevention Plan 06/15/2019 06/15/2019 06/09/2019  Post Dischage Appt - - -  Transportation Screening - - Complete  PCP or Specialist Appt within 3-5 Days Not Complete - -  Not Complete comments Left message for hospital discharge follow up coordinator at PCP office requesting return contact to schedule appointment. - -  HRI or Home Care Consult - Complete Not Complete  HRI or Home Care Consult comments - - too soon to know if patient will need any services, at baseline she does not  Social Work Consult for [place] Planning/Counseling - - Complete  Palliative Care Screening - Not Applicable Not Applicable  Medication Review (RN Care Manager) - - Complete  Some recent data might be hidden

## 2019-07-15 ENCOUNTER — Telehealth: Payer: Self-pay | Admitting: Nurse Practitioner

## 2019-07-15 LAB — BASIC METABOLIC PANEL
Anion gap: 7 (ref 5–15)
BUN: 13 mg/dL (ref 8–23)
CO2: 28 mmol/L (ref 22–32)
Calcium: 9.5 mg/dL (ref 8.9–10.3)
Chloride: 103 mmol/L (ref 98–111)
Creatinine, Ser: 0.8 mg/dL (ref 0.44–1.00)
GFR calc Af Amer: >60 mL/min (ref >60)
GFR calc non Af Amer: >60 mL/min (ref >60)
Glucose, Bld: 122 mg/dL — ABNORMAL HIGH (ref 70–99)
Potassium: 3.8 mmol/L (ref 3.5–5.1)
Sodium: 138 mmol/L (ref 135–145)

## 2019-07-15 LAB — GLUCOSE, CAPILLARY
Glucose-Capillary: 101 mg/dL — ABNORMAL HIGH (ref 70–99)
Glucose-Capillary: 103 mg/dL — ABNORMAL HIGH (ref 70–99)
Glucose-Capillary: 160 mg/dL — ABNORMAL HIGH (ref 70–99)
Glucose-Capillary: 376 mg/dL — ABNORMAL HIGH (ref 70–99)
Glucose-Capillary: 440 mg/dL — ABNORMAL HIGH (ref 70–99)
Glucose-Capillary: 70 mg/dL (ref 70–99)
Glucose-Capillary: 86 mg/dL (ref 70–99)

## 2019-07-15 MED ORDER — INSULIN ASPART 100 UNIT/ML ~~LOC~~ SOLN
0.0000 [IU] | Freq: Three times a day (TID) | SUBCUTANEOUS | Status: DC
  Administered 2019-07-15: 2 [IU] via SUBCUTANEOUS

## 2019-07-15 MED ORDER — INSULIN ASPART 100 UNIT/ML ~~LOC~~ SOLN
0.0000 [IU] | Freq: Every day | SUBCUTANEOUS | Status: DC
  Administered 2019-07-15: 5 [IU] via SUBCUTANEOUS
  Administered 2019-07-16: 4 [IU] via SUBCUTANEOUS
  Administered 2019-07-18: 3 [IU] via SUBCUTANEOUS
  Administered 2019-07-20: 5 [IU] via SUBCUTANEOUS

## 2019-07-15 MED ORDER — INSULIN ASPART 100 UNIT/ML ~~LOC~~ SOLN
0.0000 [IU] | Freq: Three times a day (TID) | SUBCUTANEOUS | Status: DC
  Administered 2019-07-16: 11 [IU] via SUBCUTANEOUS
  Administered 2019-07-16: 3 [IU] via SUBCUTANEOUS
  Administered 2019-07-17: 2 [IU] via SUBCUTANEOUS
  Administered 2019-07-17: 15 [IU] via SUBCUTANEOUS
  Administered 2019-07-18 – 2019-07-19 (×3): 3 [IU] via SUBCUTANEOUS
  Administered 2019-07-20: 5 [IU] via SUBCUTANEOUS
  Administered 2019-07-20: 8 [IU] via SUBCUTANEOUS
  Administered 2019-07-21: 5 [IU] via SUBCUTANEOUS

## 2019-07-15 MED ORDER — METOPROLOL TARTRATE 5 MG/5ML IV SOLN
5.0000 mg | Freq: Once | INTRAVENOUS | Status: AC
  Administered 2019-07-15: 5 mg via INTRAVENOUS
  Filled 2019-07-15: qty 5

## 2019-07-15 MED ORDER — INSULIN GLARGINE 100 UNIT/ML ~~LOC~~ SOLN
15.0000 [IU] | Freq: Two times a day (BID) | SUBCUTANEOUS | Status: DC
  Administered 2019-07-15 – 2019-07-20 (×9): 15 [IU] via SUBCUTANEOUS
  Filled 2019-07-15 (×15): qty 0.15

## 2019-07-15 MED ORDER — INSULIN ASPART 100 UNIT/ML ~~LOC~~ SOLN
5.0000 [IU] | Freq: Three times a day (TID) | SUBCUTANEOUS | Status: DC
  Administered 2019-07-16 – 2019-07-20 (×12): 5 [IU] via SUBCUTANEOUS

## 2019-07-15 MED ORDER — INSULIN ASPART 100 UNIT/ML ~~LOC~~ SOLN
25.0000 [IU] | Freq: Once | SUBCUTANEOUS | Status: AC
  Administered 2019-07-15: 25 [IU] via SUBCUTANEOUS

## 2019-07-15 NOTE — Progress Notes (Signed)
Late entry  Report given to Perfecto Kingdom, RN on 300. Patient transported to 302 via bed, all VSS.

## 2019-07-15 NOTE — Telephone Encounter (Signed)
Please mail to the patient's home CDiff hand washing and sanitation instructions/precautions. Let me know if you need them or can't find them.

## 2019-07-15 NOTE — Progress Notes (Signed)
PROGRESS NOTE    [name]  UDJ:[id number] DOB: April 17, 1951 DOA: 07/13/2019 PCP: Jalene Mullet, PA-C     Brief Narrative:  68 y.o. female, with past medical history for A. fib, on Eliquis, chronic back pain, COPD, on room air, DDD, ulcerative colitis, C. difficile colitis, with recent prolonged hospitalization secondary to colitis related to ulcerative colitis and C. difficile colitis, she was discharged on prednisone taper, and vancomycin taper, patient/her husband reports she has been compliant with her regimen, patient reports for last 3 days, she has been having worsening diarrhea, 6-10 times a day, so far she had 6 episodes for today, as well she is having persistent nausea, but no vomiting, no coffee-ground emesis, as well she has been having poor appetite, abdominal pain, crampy, nonradiating, reports some minimal amount of blood in her stool, but report is always been like that, no NSAIDs use, no alcohol use, she is on Eliquis for A. fib, no fever, no chills, no cough, no shortness of breath, no COVID-19 exposure. -in ED patient noted to be in A. fib with RVR requiring Cardizem drip, potassium 3.2, CT abdomen and pelvis significant for left side: Colitis, TRH called for admission.   Assessment & Plan: 1-abdominal pain/diarrhea secondary to colitis -With concern for ulcerative colitis flare versus C. difficile reinfection -Continue p.o. steroids and oral vancomycin -Follow GI service recommendation -C. difficile by PCR positive, toxin results negative. -Continue supportive care, gentle fluid resuscitation and electrolyte repletion. -Patient reporting no abdominal pain currently and her stools are firming up. -PPI discontinued. -Continue advancing diet as tolerated. -Hopefully home in the next 24 to 48 hours.  2-Paroxysmal atrial fibrillation with RVR on presentation(HCC) -Heart rate improved -Off Cardizem drip for almost 24 hours now. -Blood pressure soft, but stable.  -Patient remains in atrial fibrillation -Will continue adjusted dose of beta-blocker and continue Eliquis for anticoagulation. -Continue correcting potassium and magnesium level as needed -Continue telemetry monitoring   3-hypokalemia -In the setting of GI losses -Continue repletion as needed -Magnesium within normal limits currently. -Follow electrolytes trend.  4-COPD -Continue bronchodilators as needed -Patient without any wheezing is currently. -Good O2 sat on room air appreciated.  5-anxiety/depression -Mood is stable -No suicidal ideation or hallucination -Continue home medication regiment.  6-GERD -Continue famotidine -PPI discontinued in the setting of concerns for C. difficile infection.  7-hyperlipidemia -Continue statins.  8-type 2 diabetes with hyperglycemia -A1c 9.9 -CBGs elevated most likely in the setting of steroids usage, and also early requirement of Cardizem drip (which he is on dextrose-based). -Continue moderate sliding scale insulin, Lantus twice daily and NovoLog for meal coverage. -Follow CBGs fluctuation for further adjustment on hypoglycemic regimen.   DVT prophylaxis: Apixaban. Code Status: Full code Family Communication: No family at bedside. Disposition Plan: Remains inpatient; will transfer to telemetry bed.  Continue adjusted dose of metoprolol; continue steroids and antibiotics as recommended by GI service.  Given elevated A1c patient will be started on sliding scale insulin and Lantus.  Follow CBGs for further hypoglycemic regimen adjustments.  Continue adequate hydration and advance diet as tolerated.  Consultants:   GI service  Procedures:   See below for x-ray reports.  Antimicrobials:  Anti-infectives (From admission, onward)   Start     Dose/Rate Route Frequency Ordered Stop   08/19/19 1000  vancomycin (VANCOCIN) 50 mg/mL oral solution 125 mg     125 mg Oral Every 3 DAYS 07/13/19 1813 08/28/19 0959   08/11/19 1000  vancomycin  (VANCOCIN) 50 mg/mL oral solution  125 mg     125 mg Oral Every other day 07/13/19 1813 08/19/19 0959   08/04/19 1000  vancomycin (VANCOCIN) 50 mg/mL oral solution 125 mg     125 mg Oral Daily 07/13/19 1813 08/11/19 0959   07/27/19 2200  vancomycin (VANCOCIN) 50 mg/mL oral solution 125 mg     125 mg Oral 2 times daily 07/13/19 1813 08/03/19 2159   07/13/19 1815  vancomycin (VANCOCIN) 50 mg/mL oral solution 125 mg  Status:  Discontinued     125 mg Oral Every 6 hours 07/13/19 1813 07/13/19 1818   07/13/19 1815  vancomycin (VANCOCIN) 50 mg/mL oral solution 125 mg     125 mg Oral 4 times daily 07/13/19 1813 07/27/19 1759       Subjective: No fever, no chest pain, no nausea, no vomiting, no palpitations.  Reports breathing with oxygen saturation within normal limits on room air; stools forming up.  Patient has remained off Cardizem drip for 24 hours now and is no longer requiring insulin drip.  Objective: Vitals:   07/15/19 1550 07/15/19 1600 07/15/19 1611 07/15/19 1630  BP: (!) 149/82 98/62    Pulse: (!) 108     Resp: 20 (!) 21 17   Temp:    98.2 F (36.8 C)  TempSrc:    Oral  SpO2: 93%     Weight:      Height:        Intake/Output Summary (Last 24 hours) at 07/15/2019 1643 Last data filed at 07/15/2019 0300 Gross per 24 hour  Intake 137.5 ml  Output 500 ml  Net -362.5 ml   Filed Weights   07/13/19 2030 07/14/19 0500 07/15/19 0700  Weight: 81.7 kg 81.7 kg 81.4 kg    Examination: General exam: Alert, awake, oriented x 3; overall feeling better, no abdominal pain, stools forming up; no nausea, no vomiting.  Patient denies palpitations and chest pain. Respiratory system: Clear to auscultation. Respiratory effort normal.  Good O2 sat on room air. Cardiovascular system: irregular; but with better rate control.  No rubs, no gallops, no JVD. Gastrointestinal system: Abdomen is nondistended, soft and nontender. No organomegaly or masses felt. Normal bowel sounds heard. Central  nervous system: Alert and oriented. No focal neurological deficits. Extremities: No cyanosis or clubbing. Skin: No rashes, lesions or ulcers. Psychiatry: Judgement and insight appear normal. Mood & affect appropriate.    Data Reviewed: I have personally reviewed following labs and imaging studies  CBC: Recent Labs  Lab 07/13/19 1447 07/14/19 0537  WBC 12.0* 10.9*  NEUTROABS 7.3  --   HGB 12.8 11.3*  HCT 40.6 35.2*  MCV 93.1 92.9  PLT 138* 419   Basic Metabolic Panel: Recent Labs  Lab 07/13/19 1447 07/14/19 0537 07/15/19 0417  NA 134* 138 138  K 3.2* 3.5 3.8  CL 96* 105 103  CO2 24 23 28   GLUCOSE 369* 295* 122*  BUN 9 7* 13  CREATININE 0.79 0.65 0.80  CALCIUM 9.4 8.7* 9.5   GFR: Estimated Creatinine Clearance: 67.6 mL/min (by C-G formula based on SCr of 0.8 mg/dL).   Liver Function Tests: Recent Labs  Lab 07/13/19 1447  AST 8*  ALT 12  ALKPHOS 60  BILITOT 0.9  PROT 5.9*  ALBUMIN 3.2*   Coagulation Profile: Recent Labs  Lab 07/13/19 1447  INR 1.0   CBG: Recent Labs  Lab 07/15/19 0111 07/15/19 0219 07/15/19 0806 07/15/19 1148 07/15/19 1629  GLUCAP 86 70 103* 160* 440*   Urine analysis:  Component Value Date/Time   COLORURINE STRAW (A) 07/13/2019 1427   APPEARANCEUR CLEAR 07/13/2019 1427   LABSPEC 1.029 07/13/2019 1427   PHURINE 6.0 07/13/2019 1427   GLUCOSEU >=500 (A) 07/13/2019 1427   HGBUR NEGATIVE 07/13/2019 1427   BILIRUBINUR NEGATIVE 07/13/2019 1427   KETONESUR 20 (A) 07/13/2019 1427   PROTEINUR NEGATIVE 07/13/2019 1427   NITRITE NEGATIVE 07/13/2019 1427   LEUKOCYTESUR TRACE (A) 07/13/2019 1427    Recent Results (from the past 240 hour(s))  SARS CORONAVIRUS 2 (TAT 6-24 HRS) Nasopharyngeal Nasopharyngeal Swab     Status: None   Collection Time: 07/13/19  5:08 PM   Specimen: Nasopharyngeal Swab  Result Value Ref Range Status   SARS Coronavirus 2 NEGATIVE NEGATIVE Final    Comment: (NOTE) SARS-CoV-2 target nucleic acids are NOT  DETECTED. The SARS-CoV-2 RNA is generally detectable in upper and lower respiratory specimens during the acute phase of infection. Negative results do not preclude SARS-CoV-2 infection, do not rule out co-infections with other pathogens, and should not be used as the sole basis for treatment or other patient management decisions. Negative results must be combined with clinical observations, patient history, and epidemiological information. The expected result is Negative. Fact Sheet for Patients: SugarRoll.be Fact Sheet for Healthcare Providers: https://www.woods-mathews.com/ This test is not yet approved or cleared by the [place] FDA and  has been authorized for detection and/or diagnosis of SARS-CoV-2 by FDA under an Emergency Use Authorization (EUA). This EUA will remain  in effect (meaning this test can be used) for the duration of the COVID-19 declaration under Section 56 4(b)(1) of the Act, 21 U.S.C. section 360bbb-3(b)(1), unless the authorization is terminated or revoked sooner. Performed at [facility], [place] [address], [place], [place] [address]   MRSA PCR Screening     Status: None   Collection Time: 07/13/19  7:58 PM   Specimen: Nasal Mucosa; Nasopharyngeal  Result Value Ref Range Status   MRSA by PCR NEGATIVE NEGATIVE Final    Comment:        The GeneXpert MRSA Assay (FDA approved for NASAL specimens only), is one component of a comprehensive MRSA colonization surveillance program. It is not intended to diagnose MRSA infection nor to guide or monitor treatment for MRSA infections. Performed at [facility], [address] [place], [place] [address]   C Difficile Quick Screen w PCR reflex     Status: Abnormal   Collection Time: 07/14/19 12:00 PM   Specimen: Stool  Result Value Ref Range Status   C Diff antigen POSITIVE (A) NEGATIVE Final   C Diff toxin NEGATIVE NEGATIVE Final   C Diff interpretation Results  are indeterminate. See PCR results.  Final    Comment: Performed at [facility], [address] [place], [place] [address]  C. Diff by PCR, Reflexed     Status: Abnormal   Collection Time: 07/14/19 12:00 PM  Result Value Ref Range Status   Toxigenic C. Difficile by PCR POSITIVE (A) NEGATIVE Final    Comment: Positive for toxigenic C. difficile with little to no toxin production. Only treat if clinical presentation suggests symptomatic illness. Performed at [facility], [place] [address], [place], [place] [address]      Radiology Studies: No results found.  Scheduled Meds: . acidophilus  1 capsule Oral Daily  . apixaban  5 mg Oral BID  . azelastine  2 spray Each Nare BID  . balsalazide  2,250 mg Oral TID  . calcium-vitamin D  1  tablet Oral BID  . Chlorhexidine Gluconate Cloth  6 each Topical Daily  . dicyclomine  10 mg Oral BID AC  . famotidine  20 mg Oral BID  . insulin aspart  0-15 Units Subcutaneous TID WC  . insulin aspart  0-5 Units Subcutaneous QHS  . insulin aspart  25 Units Subcutaneous Once  . insulin aspart  5 Units Subcutaneous TID WC  . insulin glargine  15 Units Subcutaneous BID  . ketotifen  1 drop Both Eyes Daily  . metoprolol tartrate  75 mg Oral BID  . mometasone-formoterol  2 puff Inhalation BID  . pravastatin  20 mg Oral Daily  . predniSONE  40 mg Oral Q breakfast  . pregabalin  100 mg Oral BID  . traZODone  100 mg Oral QHS  . vancomycin  125 mg Oral QID   Followed by  . [START ON 07/27/2019] vancomycin  125 mg Oral BID   Followed by  . [START ON 08/04/2019] vancomycin  125 mg Oral Daily   Followed by  . [START ON 08/11/2019] vancomycin  125 mg Oral QODAY   Followed by  . [START ON 08/19/2019] vancomycin  125 mg Oral Q3 days   Continuous Infusions: . sodium chloride Stopped (07/14/19 2210)     LOS: 1 day    Time spent: 35 minutes.  Greater than 50% of this time was spent in direct contact with the patient, coordinating care and discussing  relevant ongoing clinical issues, including ongoing presentation of colitis; concern for ulcerative colitis flare and also A. fib with RVR requiring the use of Cardizem drip.  Patient overnight with elevated blood sugar requiring the use of insulin drip.  Overall improved, with better heart rate control without the use of Cardizem and off insulin drip this morning.  A1c 9.9.  Reports his stools are firming up, no nausea, no vomiting, no abdominal pain.   Barton Dubois, MD [facility] Pager [phone]   07/15/2019, 4:43 PM

## 2019-07-15 NOTE — Progress Notes (Signed)
Evening BG 440. Dr. Dyann Kief paged and orders given to administer 25 units insulin now. Meal coverage and lantus also ordered.

## 2019-07-15 NOTE — Progress Notes (Signed)
Subjective: Doing well today, still no abdominal pain, N/V. Has a "pasty" stools yesterday and today. No overt diarrhea. No significant GI bleed. No other GI complaints. Discussed handwashing and sanitation with CDiff.  Objective: Vital signs in last 24 hours: Temp:  [98.3 F (36.8 C)-99.2 F (37.3 C)] 99.2 F (37.3 C) (10/16 1151) Pulse Rate:  [43-113] 104 (10/16 1109) Resp:  [8-33] 16 (10/16 1109) BP: (77-133)/(50-89) 106/87 (10/16 1000) SpO2:  [71 %-99 %] 93 % (10/16 1109) Weight:  [81.4 kg] 81.4 kg (10/16 0700) Last BM Date: 07/15/19 General:   Alert and oriented, pleasant Head:  Normocephalic and atraumatic. Eyes:  No icterus, sclera clear. Conjuctiva pink.  Heart:  S1, S2 present, no murmurs noted.  Lungs: Clear to auscultation bilaterally, without wheezing, rales, or rhonchi.  Abdomen:  Bowel sounds present, soft, non-tender, non-distended. No HSM or hernias noted. No rebound or guarding. No masses appreciated  Msk:  Symmetrical without gross deformities. Normal posture. Extremities:  Without clubbing or edema. Neurologic:  Alert and  oriented x4;  grossly normal neurologically. Psych:  Alert and cooperative. Normal mood and affect.  Intake/Output from previous day: 10/15 0701 - 10/16 0700 In: 137.5 [I.V.:137.5] Out: 500 [Urine:500] Intake/Output this shift: No intake/output data recorded.  Lab Results: Recent Labs    07/13/19 1447 07/14/19 0537  WBC 12.0* 10.9*  HGB 12.8 11.3*  HCT 40.6 35.2*  PLT 138* 274   BMET Recent Labs    07/13/19 1447 07/14/19 0537 07/15/19 0417  NA 134* 138 138  K 3.2* 3.5 3.8  CL 96* 105 103  CO2 24 23 28   GLUCOSE 369* 295* 122*  BUN 9 7* 13  CREATININE 0.79 0.65 0.80  CALCIUM 9.4 8.7* 9.5   LFT Recent Labs    07/13/19 1447  PROT 5.9*  ALBUMIN 3.2*  AST 8*  ALT 12  ALKPHOS 60  BILITOT 0.9   PT/INR Recent Labs    07/13/19 1447  LABPROT 13.1  INR 1.0   Hepatitis Panel No results for input(s): HEPBSAG,  HCVAB, HEPAIGM, HEPBIGM in the last 72 hours.   Studies/Results: Ct Abdomen Pelvis W Contrast  Result Date: 07/13/2019 CLINICAL DATA:  Diarrhea for 2-3 days. Prior admission 3 weeks ago for C. difficile colitis. EXAM: CT ABDOMEN AND PELVIS WITH CONTRAST TECHNIQUE: Multidetector CT imaging of the abdomen and pelvis was performed using the standard protocol following bolus administration of intravenous contrast. CONTRAST:  193m OMNIPAQUE IOHEXOL 300 MG/ML  SOLN COMPARISON:  05/20/2019 FINDINGS: Lower chest: Large hiatal hernia containing most of stomach. The gastroesophageal junction is well above the hiatus. There some organoaxial rotation of the herniated stomach. Upper normal heart size. Passive atelectasis associated with the large hiatal hernia. Descending thoracic aortic atherosclerotic vascular disease. Hepatobiliary: Cholecystectomy. The liver appears otherwise unremarkable. No biliary dilatation. 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 Do not directly observe an ectopic ureterocele. No scarring or hydronephrosis. The right kidney appears unremarkable. Adrenal glands normal. Stomach/Bowel: As noted above most of stomach is herniated into the chest. Small periampullary duodenal diverticulum. There is abnormal wall thickening and mucosal enhancement in the cecum and ascending colon. Distal descending and sigmoid colon diverticulosis with very faint adjacent stranding in the mesentery, less than on 05/20/2019, but still potentially manifestation of low-grade diverticulitis. Mildly accentuated mucosal enhancement in the rectum with small rectal air fluid levels. Tiny localized amount of  gas adjacent the terminal ileum on image 80/6 which is probably in a small diverticulum, similar appearance suggested on image 47/2 of 05/20/2019 exam. Scattered  nondilated jejunal loops with air-fluid levels. Vascular/Lymphatic: Aortoiliac atherosclerotic vascular disease. Widely patent celiac trunk SMA. Patent IMA. Reproductive: Unremarkable Other: No supplemental non-categorized findings. Musculoskeletal: Small periumbilical hernia mesh. Lower thoracic and lumbar spondylosis and degenerative disc disease. IMPRESSION: 1. Abnormal mucosal enhancement wall thickening the cecum and ascending colon favoring colitis. There is some questionable proximal sigmoid diverticulitis, as well as accentuated mucosa enhancement in the sigmoid colon and rectum. The appearance is most compatible with colitis perhaps very low-grade proximal diverticulitis. Favor infectious colitis or inflammatory bowel disease over ischemic colitis given the wide patency of the mesenteric vessels. Given the patient's history, correlation with C difficile toxins is recommended to exclude recurrent C difficile colitis. 2. Large hiatal hernia with some organoaxial rotation of the herniated stomach. 3. 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 4.  Aortic Atherosclerosis (ICD10-I70.0). Electronically Signed   By: Van Clines M.D.   On: 07/13/2019 16:56    Assessment: Pleasant 67 year old female well-known to our practice with a complicated colitis picture.  She was recently admitted for prolonged period of time for CT documented pancolitis.  C. difficile was positive at that time and she was treated with vancomycin as well as [place] (for chronic ulcerative colitis medication).  Because of nonimprovement she underwent flexible sigmoidoscopy with biopsies which demonstrated severe active ulcerative colitis.  She was tried on multiple medications and eventually IV steroids which was transitioned to oral prednisone.  She was discharged on vancomycin and prednisone after she began having improvement in her stools.  She continued her medications based on what was  recommended at discharge.  She presented with 3-day history of diarrhea, nausea, abdominal pain.  CT again documents colitis although it seems to be less global and more limited to cecum/ascending colon as well as proximal sigmoid colon.  She remains on vancomycin and steroids in the hospital.  She was also on this outside the hospital.    She initially had elevated white blood cell count of 12.0 which raises suspicion for recurrent infectious colitis, although it could be prednisone effect although her white blood cell count just prior to discharge, while also on steroids, was 9.9.  Yesterday was down to 10.9 after starting antibiotics.  Her hemoglobin yesterday was 11.3 which is baseline. CDiff positive PCR with no toxin, likely continuing to shed spores rather than active infection.  Since admission has had 2 stools which are consistent with [place] 5-6, no overt diarrhea. Abdominal pain subsided, no further nausea. Not sure what triggered her recent symptoms but no apparent recurrent CDiff. Her vitals are stable at the moment.  Her labs look good.  She is in ICU because of A. fib with RVR initially in the 160s but her heart rate is improved now and pending transfer to floor.   I called and spoke with Dr. Sabra Heck (Radiologist) to compare CT scans. Overall looks improved. Cecum wasn't involved last time and seems to be the most prominent area of focus now. Other previous areas of mucosal enhancement and wall thickening look improved.  Given this, possible protracted, slow to improve UC/CDiff colitis. Possible recurrence in the cecum, less likely treatment failure  Plan: 1. Continued Vancomycin and Prednisone as per taper instructions at previous discharge 2. Monitor for change in symptoms 3. Supportive measures 4. Anticipate discharge home in the next 24-48 hours from GI  standoint 5. Keep outpatient follow-up appointment (08/11/19) in our office.   Thank you for allowing [place] to participate  in the care of [place], DNP, AGNP-C Adult & Gerontological Nurse Practitioner [facility]     LOS: 1 day    07/15/2019, 2:16 PM

## 2019-07-15 NOTE — Progress Notes (Addendum)
Bed alarm noted to be ringing. Nurse went in to assess. Pt found standing at side of bed and poop on floor. Pt assisted to commode and HR noted to increase to 170's. Once pt settled back into bed, HR continued to fluctuate to 160's. Unable to give prn metoprolol as it was given at 1550. Dr. Dyann Kief notified.  Verbal order to give another 1 time dose of metoprolol 5 mg IV and to still give po scheduled dose tonight.

## 2019-07-15 NOTE — Care Management Important Message (Signed)
Important Message  Patient Details  Name: [name] MRN: [id number] Date of Birth: 10/19/1950   Medicare Important Message Given:  Yes(given to RN to deliver to patient due to contact precautions)     Tommy Medal 07/15/2019, 3:39 PM

## 2019-07-16 DIAGNOSIS — K529 Noninfective gastroenteritis and colitis, unspecified: Secondary | ICD-10-CM

## 2019-07-16 LAB — GLUCOSE, CAPILLARY
Glucose-Capillary: 72 mg/dL (ref 70–99)
Glucose-Capillary: 185 mg/dL — ABNORMAL HIGH (ref 70–99)
Glucose-Capillary: 314 mg/dL — ABNORMAL HIGH (ref 70–99)
Glucose-Capillary: 329 mg/dL — ABNORMAL HIGH (ref 70–99)

## 2019-07-16 NOTE — Progress Notes (Signed)
   Assessment/Plan: ADMITTED WITH ACUTE UC FLARE WHILE ON C DIFF VANC TAPER.CLINICALLY IMPROVED.  PLAN: 1. CONTINUE PREDNISONE/VANC TAPER. 2. LACTOSE FREE DIET.   Subjective: Since I last evaluated the patient SOFT STOOL THIS AM. TOLERATING POs. FEELS CONFUSED.  Objective: Vital signs in last 24 hours: Vitals:   07/15/19 2203 07/16/19 0531  BP: (!) 114/94 132/83  Pulse: 93 90  Resp: 20 18  Temp: 98 F (36.7 C)   SpO2: 96% 95%   General appearance: alert, cooperative and no distress Resp: clear to auscultation bilaterally Cardio: regular rate and IRREGULAR rhythm GI: soft, non-tender; bowel sounds normal;   Lab Results:  NONE   Studies/Results: No results found.  Medications: I have reviewed the patient's current medications.

## 2019-07-16 NOTE — Progress Notes (Signed)
PROGRESS NOTE    [name]  SNK:[id number] DOB: 11/10/1950 DOA: 07/13/2019 PCP: Jalene Mullet, PA-C     Brief Narrative:  68 y.o. female, with past medical history for A. fib, on Eliquis, chronic back pain, COPD, on room air, DDD, ulcerative colitis, C. difficile colitis, with recent prolonged hospitalization secondary to colitis related to ulcerative colitis and C. difficile colitis, she was discharged on prednisone taper, and vancomycin taper, patient/her husband reports she has been compliant with her regimen, patient reports for last 3 days, she has been having worsening diarrhea, 6-10 times a day, so far she had 6 episodes for today, as well she is having persistent nausea, but no vomiting, no coffee-ground emesis, as well she has been having poor appetite, abdominal pain, crampy, nonradiating, reports some minimal amount of blood in her stool, but report is always been like that, no NSAIDs use, no alcohol use, she is on Eliquis for A. fib, no fever, no chills, no cough, no shortness of breath, no COVID-19 exposure. -in ED patient noted to be in A. fib with RVR requiring Cardizem drip, potassium 3.2, CT abdomen and pelvis significant for left side: Colitis, TRH called for admission.   Assessment & Plan: 1-abdominal pain/diarrhea secondary to colitis -With concern for ulcerative colitis flare versus C. difficile reinfection -Continue p.o. steroids and oral vancomycin -Follow GI service recommendation -C. difficile by PCR positive, toxin results negative. -Continue supportive care, gentle fluid resuscitation and electrolyte repletion. -Patient reporting no abdominal pain currently and her stools are firming up. -PPI discontinued; will continue H2 blocker.. -Patient currently tolerating diet well. -Continue advancing diet as tolerated. -Hopefully home in the next 24 to 48 hours.  2-Paroxysmal atrial fibrillation with RVR on presentation(HCC) -Heart rate improved -Off Cardizem drip  for more than 24 hours. -Blood pressure soft, but stable. -Patient remains in atrial fibrillation -Will continue adjusted dose of beta-blocker and continue Eliquis for anticoagulation. -Continue correcting potassium and magnesium level as needed -Continue telemetry monitoring   3-hypokalemia -In the setting of GI losses -Continue repletion as needed -Magnesium within normal limits currently. -Follow electrolytes trend.  4-COPD -Continue bronchodilators as needed -Patient without any wheezing currently. -Good O2 sat on room air appreciated.  5-anxiety/depression -Mood is stable -No suicidal ideation or hallucination -Continue home medication regiment.  6-GERD -Continue famotidine.  -PPI discontinued in the setting of concerns for C. difficile infection.  7-hyperlipidemia -Continue statins.  8-type 2 diabetes with hyperglycemia -A1c 9.9 -CBGs elevated most likely in the setting of steroids usage, and also early requirement of Cardizem drip (which is  dextrose-based). -Continue moderate sliding scale insulin, Lantus twice a day and NovoLog for meal coverage.  -Follow CBGs fluctuation for further adjustment on hypoglycemic regimen.   DVT prophylaxis: Apixaban. Code Status: Full code Family Communication: No family at bedside. Disposition Plan: Remains inpatient; in telemetry bed. Continue adjusted dose of metoprolol. Continue steroids and antibiotics as recommended by GI service. Continue on sliding scale insulin and Lantus. Follow CBGs for further hypoglycemic regimen adjustments. Continue hydration and advancing diet as tolerated.  Consultants:   GI service  Procedures:   See below for x-ray reports.  Antimicrobials:  Anti-infectives (From admission, onward)   Start     Dose/Rate Route Frequency Ordered Stop   08/19/19 1000  vancomycin (VANCOCIN) 50 mg/mL oral solution 125 mg     125 mg Oral Every 3 DAYS 07/13/19 1813 08/28/19 0959   08/11/19 1000  vancomycin  (VANCOCIN) 50 mg/mL oral solution 125 mg  125 mg Oral Every other day 07/13/19 1813 08/19/19 0959   08/04/19 1000  vancomycin (VANCOCIN) 50 mg/mL oral solution 125 mg     125 mg Oral Daily 07/13/19 1813 08/11/19 0959   07/27/19 2200  vancomycin (VANCOCIN) 50 mg/mL oral solution 125 mg     125 mg Oral 2 times daily 07/13/19 1813 08/03/19 2159   07/13/19 1815  vancomycin (VANCOCIN) 50 mg/mL oral solution 125 mg  Status:  Discontinued     125 mg Oral Every 6 hours 07/13/19 1813 07/13/19 1818   07/13/19 1815  vancomycin (VANCOCIN) 50 mg/mL oral solution 125 mg     125 mg Oral 4 times daily 07/13/19 1813 07/27/19 1759       Subjective: No fever, no chest pain, no nausea, no vomiting, no palpitations. Reports that stools continue to firm up. No longer requiring insulin drip. Patient reports having good appetite.  Objective: Vitals:   07/15/19 2017 07/15/19 2130 07/15/19 2203 07/16/19 0531  BP: (!) 108/92 103/90 (!) 114/94 132/83  Pulse: (!) 115  93 90  Resp:  17 20 18   Temp:   98 F (36.7 C)   TempSrc:   Oral   SpO2:   96% 95%  Weight:      Height:        Intake/Output Summary (Last 24 hours) at 07/16/2019 0940 Last data filed at 07/15/2019 2000 Gross per 24 hour  Intake -  Output 300 ml  Net -300 ml   Filed Weights   07/13/19 2030 07/14/19 0500 07/15/19 0700  Weight: 81.7 kg 81.7 kg 81.4 kg    Examination: General exam: Alert, awake, oriented x 3. Afebrile; feeling better, no abdominal pain. Reports that stool is taking a more firm consistency. No nausea, no vomiting.  Respiratory system: Clear to auscultation. Respiratory effort normal. Cardiovascular system:Irregular, but rate controlled. No rubs, gallops or JVD. Gastrointestinal system: Abdomen is nondistended, soft and nontender. No organomegaly or masses felt. Normal bowel sounds heard. Central nervous system: Alert and oriented. No focal neurological deficits. Extremities: No cyanosis or clubbing, +pedal pulses  Skin: No rashes, lesions or ulcers. Psychiatry: Judgement and insight appear normal. Mood & affect appropriate.   Data Reviewed: I have personally reviewed following labs and imaging studies  CBC: Recent Labs  Lab 07/13/19 1447 07/14/19 0537  WBC 12.0* 10.9*  NEUTROABS 7.3  --   HGB 12.8 11.3*  HCT 40.6 35.2*  MCV 93.1 92.9  PLT 138* 983   Basic Metabolic Panel: Recent Labs  Lab 07/13/19 1447 07/14/19 0537 07/15/19 0417  NA 134* 138 138  K 3.2* 3.5 3.8  CL 96* 105 103  CO2 24 23 28   GLUCOSE 369* 295* 122*  BUN 9 7* 13  CREATININE 0.79 0.65 0.80  CALCIUM 9.4 8.7* 9.5   GFR: Estimated Creatinine Clearance: 67.6 mL/min (by C-G formula based on SCr of 0.8 mg/dL).   Liver Function Tests: Recent Labs  Lab 07/13/19 1447  AST 8*  ALT 12  ALKPHOS 60  BILITOT 0.9  PROT 5.9*  ALBUMIN 3.2*   Coagulation Profile: Recent Labs  Lab 07/13/19 1447  INR 1.0   CBG: Recent Labs  Lab 07/15/19 0806 07/15/19 1148 07/15/19 1629 07/15/19 1943 07/16/19 0810  GLUCAP 103* 160* 440* 376* 72   Urine analysis:    Component Value Date/Time   COLORURINE STRAW (A) 07/13/2019 1427   APPEARANCEUR CLEAR 07/13/2019 1427   LABSPEC 1.029 07/13/2019 1427   PHURINE 6.0 07/13/2019 1427   GLUCOSEU >=500 (  A) 07/13/2019 1427   HGBUR NEGATIVE 07/13/2019 1427   BILIRUBINUR NEGATIVE 07/13/2019 1427   KETONESUR 20 (A) 07/13/2019 1427   PROTEINUR NEGATIVE 07/13/2019 1427   NITRITE NEGATIVE 07/13/2019 1427   LEUKOCYTESUR TRACE (A) 07/13/2019 1427    Recent Results (from the past 240 hour(s))  SARS CORONAVIRUS 2 (TAT 6-24 HRS) Nasopharyngeal Nasopharyngeal Swab     Status: None   Collection Time: 07/13/19  5:08 PM   Specimen: Nasopharyngeal Swab  Result Value Ref Range Status   SARS Coronavirus 2 NEGATIVE NEGATIVE Final    Comment: (NOTE) SARS-CoV-2 target nucleic acids are NOT DETECTED. The SARS-CoV-2 RNA is generally detectable in upper and lower respiratory specimens during the  acute phase of infection. Negative results do not preclude SARS-CoV-2 infection, do not rule out co-infections with other pathogens, and should not be used as the sole basis for treatment or other patient management decisions. Negative results must be combined with clinical observations, patient history, and epidemiological information. The expected result is Negative. Fact Sheet for Patients: SugarRoll.be Fact Sheet for Healthcare Providers: https://www.woods-mathews.com/ This test is not yet approved or cleared by the [place] FDA and  has been authorized for detection and/or diagnosis of SARS-CoV-2 by FDA under an Emergency Use Authorization (EUA). This EUA will remain  in effect (meaning this test can be used) for the duration of the COVID-19 declaration under Section 56 4(b)(1) of the Act, 21 U.S.C. section 360bbb-3(b)(1), unless the authorization is terminated or revoked sooner. Performed at [facility], [place] [address], [place], [place] [address]   MRSA PCR Screening     Status: None   Collection Time: 07/13/19  7:58 PM   Specimen: Nasal Mucosa; Nasopharyngeal  Result Value Ref Range Status   MRSA by PCR NEGATIVE NEGATIVE Final    Comment:        The GeneXpert MRSA Assay (FDA approved for NASAL specimens only), is one component of a comprehensive MRSA colonization surveillance program. It is not intended to diagnose MRSA infection nor to guide or monitor treatment for MRSA infections. Performed at [facility], [address], [place], [place] [address]   C Difficile Quick Screen w PCR reflex     Status: Abnormal   Collection Time: 07/14/19 12:00 PM   Specimen: Stool  Result Value Ref Range Status   C Diff antigen POSITIVE (A) NEGATIVE Final   C Diff toxin NEGATIVE NEGATIVE Final   C Diff interpretation Results are indeterminate. See PCR results.  Final    Comment: Performed at [facility], [address]  [place], [place] [address]  C. Diff by PCR, Reflexed     Status: Abnormal   Collection Time: 07/14/19 12:00 PM  Result Value Ref Range Status   Toxigenic C. Difficile by PCR POSITIVE (A) NEGATIVE Final    Comment: Positive for toxigenic C. difficile with little to no toxin production. Only treat if clinical presentation suggests symptomatic illness. Performed at [facility], [place] [address], [place], [place] [address]      Radiology Studies: No results found.  Scheduled Meds: . acidophilus  1 capsule Oral Daily  . apixaban  5 mg Oral BID  . azelastine  2 spray Each Nare BID  . balsalazide  2,250 mg Oral TID  . calcium-vitamin D  1 tablet Oral BID  . Chlorhexidine Gluconate Cloth  6 each Topical Daily  . dicyclomine  10 mg Oral BID AC  . famotidine  20 mg Oral BID  .  insulin aspart  0-15 Units Subcutaneous TID WC  . insulin aspart  0-5 Units Subcutaneous QHS  . insulin aspart  5 Units Subcutaneous TID WC  . insulin glargine  15 Units Subcutaneous BID  . ketotifen  1 drop Both Eyes Daily  . metoprolol tartrate  75 mg Oral BID  . mometasone-formoterol  2 puff Inhalation BID  . pravastatin  20 mg Oral Daily  . predniSONE  40 mg Oral Q breakfast  . pregabalin  100 mg Oral BID  . traZODone  100 mg Oral QHS  . vancomycin  125 mg Oral QID   Followed by  . [START ON 07/27/2019] vancomycin  125 mg Oral BID   Followed by  . [START ON 08/04/2019] vancomycin  125 mg Oral Daily   Followed by  . [START ON 08/11/2019] vancomycin  125 mg Oral QODAY   Followed by  . [START ON 08/19/2019] vancomycin  125 mg Oral Q3 days   Continuous Infusions: . sodium chloride Stopped (07/14/19 2210)     LOS: 2 days    Time spent: 30 minutes.   Barton Dubois, MD [facility] Pager [phone]   07/16/2019, 9:40 AM

## 2019-07-17 LAB — GLUCOSE, CAPILLARY
Glucose-Capillary: 103 mg/dL — ABNORMAL HIGH (ref 70–99)
Glucose-Capillary: 145 mg/dL — ABNORMAL HIGH (ref 70–99)
Glucose-Capillary: 395 mg/dL — ABNORMAL HIGH (ref 70–99)
Glucose-Capillary: 53 mg/dL — ABNORMAL LOW (ref 70–99)
Glucose-Capillary: 93 mg/dL (ref 70–99)

## 2019-07-17 NOTE — Progress Notes (Signed)
PROGRESS NOTE    [name]  YFV:[id number] DOB: 1951/01/08 DOA: 07/13/2019 PCP: Jalene Mullet, PA-C     Brief Narrative:  68 y.o. female, with past medical history for A. fib, on Eliquis, chronic back pain, COPD, on room air, DDD, ulcerative colitis, C. difficile colitis, with recent prolonged hospitalization secondary to colitis related to ulcerative colitis and C. difficile colitis, she was discharged on prednisone taper, and vancomycin taper, patient/her husband reports she has been compliant with her regimen, patient reports for last 3 days, she has been having worsening diarrhea, 6-10 times a day, so far she had 6 episodes for today, as well she is having persistent nausea, but no vomiting, no coffee-ground emesis, as well she has been having poor appetite, abdominal pain, crampy, nonradiating, reports some minimal amount of blood in her stool, but report is always been like that, no NSAIDs use, no alcohol use, she is on Eliquis for A. fib, no fever, no chills, no cough, no shortness of breath, no COVID-19 exposure. -in ED patient noted to be in A. fib with RVR requiring Cardizem drip, potassium 3.2, CT abdomen and pelvis significant for left side: Colitis, TRH called for admission.   Assessment & Plan: 1-abdominal pain/diarrhea secondary to colitis -With concern for ulcerative colitis flare versus C. difficile reinfection -Continue p.o. steroids and oral vancomycin -Follow GI service recommendation -C. difficile by PCR positive, toxin results negative. -Continue supportive care, gentle fluid resuscitation and electrolyte repletion. -Patient reporting no abdominal pain currently and her stools are firming up. -PPI discontinued; will continue H2 blocker.. -Patient currently tolerating diet well. -Continue advancing diet as tolerated. -Hopefully discharged in the next 24 to 48 hours; PT consulted.  2-Paroxysmal atrial fibrillation with RVR on presentation(HCC) -Heart rate improved  -Off Cardizem drip for more than 36 hours now. -Blood pressure soft, but stable.  -Patient remains in atrial fibrillation -Will continue adjusted dose of beta-blocker and continue Eliquis for anticoagulation. -Continue correcting potassium and magnesium level as needed -Continue telemetry monitoring   3-hypokalemia -In the setting of GI losses -Continue repletion as needed.  -Magnesium within normal limits currently. -Follow electrolytes trend.  4-COPD -Continue bronchodilators as needed -Patient without any wheezing currently. -Good O2 sat on room air appreciated.  5-anxiety/depression -Mood is stable -No suicidal ideation or hallucination -Continue home medication regiment.  6-GERD -Continue famotidine.  -PPI discontinued in the setting of concerns for C. difficile infection.  7-hyperlipidemia -Continue statins.  8-type 2 diabetes with hyperglycemia -A1c 9.9 -CBGs elevated most likely in the setting of steroids usage, and also early requirement of Cardizem drip (which is  dextrose-based). -Continue moderate sliding scale insulin, Lantus twice a day and NovoLog for meal coverage.  -Follow CBGs fluctuation for further adjustment on hypoglycemic regimen.  9-physical deconditioning -Physical therapy has been consulted and will follow recommendations. -After discussing with husband at bedside, patient is still significantly weak and having difficulty getting out of bed and using the bathroom around.   DVT prophylaxis: Apixaban. Code Status: Full code Family Communication: Husband at bedside. Disposition Plan: Remains inpatient; on telemetry bed. Continue metoprolol. Continue steroids and antibiotics as recommended by gastroenterology service. Continue sliding scale insulin and Lantus. Continue to follow CBGs for further hypoglycemic regimen adjustments. Follow PT recommendations for placing at discharge. Continue hydration and diet as tolerated. Discharge in the next 24-48  hours.   Consultants:   GI service  Procedures:   See below for x-ray reports.  Antimicrobials:  Anti-infectives (From admission, onward)   Start  Dose/Rate Route Frequency Ordered Stop   08/19/19 1000  vancomycin (VANCOCIN) 50 mg/mL oral solution 125 mg     125 mg Oral Every 3 DAYS 07/13/19 1813 08/28/19 0959   08/11/19 1000  vancomycin (VANCOCIN) 50 mg/mL oral solution 125 mg     125 mg Oral Every other day 07/13/19 1813 08/19/19 0959   08/04/19 1000  vancomycin (VANCOCIN) 50 mg/mL oral solution 125 mg     125 mg Oral Daily 07/13/19 1813 08/11/19 0959   07/27/19 2200  vancomycin (VANCOCIN) 50 mg/mL oral solution 125 mg     125 mg Oral 2 times daily 07/13/19 1813 08/03/19 2159   07/13/19 1815  vancomycin (VANCOCIN) 50 mg/mL oral solution 125 mg  Status:  Discontinued     125 mg Oral Every 6 hours 07/13/19 1813 07/13/19 1818   07/13/19 1815  vancomycin (VANCOCIN) 50 mg/mL oral solution 125 mg     125 mg Oral 4 times daily 07/13/19 1813 07/27/19 1759       Subjective: No fever, no chest pain, no abdominal pain, no nausea, no vomiting, no diarrhea, no palpitations. Reports that stool has similar consistency to yesterday (loose, but not watery). No longer requiring insulin drip and with stable CBG's. Tolerating diet well.   Objective: Vitals:   07/16/19 2109 07/16/19 2123 07/17/19 0355 07/17/19 1015  BP:  108/64 126/89   Pulse: (!) 142 89 (!) 103   Resp:  16 18   Temp:  98.7 F (37.1 C) 98.5 F (36.9 C)   TempSrc:  Oral Oral   SpO2:  98% 96% 96%  Weight:      Height:        Intake/Output Summary (Last 24 hours) at 07/17/2019 1231 Last data filed at 07/16/2019 2300 Gross per 24 hour  Intake 480 ml  Output -  Net 480 ml   Filed Weights   07/13/19 2030 07/14/19 0500 07/15/19 0700  Weight: 81.7 kg 81.7 kg 81.4 kg    Examination: General exam: Alert, awake, oriented x 3. Afebrile. Continues to feel better. Denies abdominal pain, no nausea, no vomiting and no  diarrhea.  Respiratory system: Clear to auscultation. Respiratory effort normal. Cardiovascular system:Irregular but rate controlled. . No murmurs, rubs, gallops. No JVD Gastrointestinal system: Abdomen is nondistended, soft and nontender. No organomegaly or masses felt. Normal bowel sounds heard. Central nervous system: Alert and oriented. No focal neurological deficits. Extremities: No C/C/E, +pedal pulses Skin: No rashes, lesions or ulcers Psychiatry: Judgement and insight appear normal. Mood & affect appropriate.    Data Reviewed: I have personally reviewed following labs and imaging studies  CBC: Recent Labs  Lab 07/13/19 1447 07/14/19 0537  WBC 12.0* 10.9*  NEUTROABS 7.3  --   HGB 12.8 11.3*  HCT 40.6 35.2*  MCV 93.1 92.9  PLT 138* 034   Basic Metabolic Panel: Recent Labs  Lab 07/13/19 1447 07/14/19 0537 07/15/19 0417  NA 134* 138 138  K 3.2* 3.5 3.8  CL 96* 105 103  CO2 24 23 28   GLUCOSE 369* 295* 122*  BUN 9 7* 13  CREATININE 0.79 0.65 0.80  CALCIUM 9.4 8.7* 9.5   GFR: Estimated Creatinine Clearance: 67.6 mL/min (by C-G formula based on SCr of 0.8 mg/dL).   Liver Function Tests: Recent Labs  Lab 07/13/19 1447  AST 8*  ALT 12  ALKPHOS 60  BILITOT 0.9  PROT 5.9*  ALBUMIN 3.2*   Coagulation Profile: Recent Labs  Lab 07/13/19 1447  INR 1.0  CBG: Recent Labs  Lab 07/16/19 1208 07/16/19 1612 07/16/19 2119 07/17/19 0727 07/17/19 1108  GLUCAP 185* 314* 329* 103* 145*   Urine analysis:    Component Value Date/Time   COLORURINE STRAW (A) 07/13/2019 1427   APPEARANCEUR CLEAR 07/13/2019 1427   LABSPEC 1.029 07/13/2019 1427   PHURINE 6.0 07/13/2019 1427   GLUCOSEU >=500 (A) 07/13/2019 1427   HGBUR NEGATIVE 07/13/2019 1427   BILIRUBINUR NEGATIVE 07/13/2019 1427   KETONESUR 20 (A) 07/13/2019 1427   PROTEINUR NEGATIVE 07/13/2019 1427   NITRITE NEGATIVE 07/13/2019 1427   LEUKOCYTESUR TRACE (A) 07/13/2019 1427    Recent Results (from the  past 240 hour(s))  SARS CORONAVIRUS 2 (TAT 6-24 HRS) Nasopharyngeal Nasopharyngeal Swab     Status: None   Collection Time: 07/13/19  5:08 PM   Specimen: Nasopharyngeal Swab  Result Value Ref Range Status   SARS Coronavirus 2 NEGATIVE NEGATIVE Final    Comment: (NOTE) SARS-CoV-2 target nucleic acids are NOT DETECTED. The SARS-CoV-2 RNA is generally detectable in upper and lower respiratory specimens during the acute phase of infection. Negative results do not preclude SARS-CoV-2 infection, do not rule out co-infections with other pathogens, and should not be used as the sole basis for treatment or other patient management decisions. Negative results must be combined with clinical observations, patient history, and epidemiological information. The expected result is Negative. Fact Sheet for Patients: SugarRoll.be Fact Sheet for Healthcare Providers: https://www.woods-mathews.com/ This test is not yet approved or cleared by the [place] FDA and  has been authorized for detection and/or diagnosis of SARS-CoV-2 by FDA under an Emergency Use Authorization (EUA). This EUA will remain  in effect (meaning this test can be used) for the duration of the COVID-19 declaration under Section 56 4(b)(1) of the Act, 21 U.S.C. section 360bbb-3(b)(1), unless the authorization is terminated or revoked sooner. Performed at [facility], [place] [address], [place], [place] [address]   MRSA PCR Screening     Status: None   Collection Time: 07/13/19  7:58 PM   Specimen: Nasal Mucosa; Nasopharyngeal  Result Value Ref Range Status   MRSA by PCR NEGATIVE NEGATIVE Final    Comment:        The GeneXpert MRSA Assay (FDA approved for NASAL specimens only), is one component of a comprehensive MRSA colonization surveillance program. It is not intended to diagnose MRSA infection nor to guide or monitor treatment for MRSA infections. Performed at [facility], [address], [place], [place] [address]   C Difficile Quick Screen w PCR reflex     Status: Abnormal   Collection Time: 07/14/19 12:00 PM   Specimen: Stool  Result Value Ref Range Status   C Diff antigen POSITIVE (A) NEGATIVE Final   C Diff toxin NEGATIVE NEGATIVE Final   C Diff interpretation Results are indeterminate. See PCR results.  Final    Comment: Performed at [facility], [address], [place], [place] [address]  C. Diff by PCR, Reflexed     Status: Abnormal   Collection Time: 07/14/19 12:00 PM  Result Value Ref Range Status   Toxigenic C. Difficile by PCR POSITIVE (A) NEGATIVE Final    Comment: Positive for toxigenic C. difficile with little to no toxin production. Only treat if clinical presentation suggests symptomatic illness. Performed at [facility], [place] [address], [place], [place] [address]      Radiology Studies: No results found.  Scheduled Meds: . acidophilus  1 capsule Oral Daily  .  apixaban  5 mg Oral BID  . azelastine  2 spray Each Nare BID  . balsalazide  2,250 mg Oral TID  . calcium-vitamin D  1 tablet Oral BID  . dicyclomine  10 mg Oral BID AC  . famotidine  20 mg Oral BID  . insulin aspart  0-15 Units Subcutaneous TID WC  . insulin aspart  0-5 Units Subcutaneous QHS  . insulin aspart  5 Units Subcutaneous TID WC  . insulin glargine  15 Units Subcutaneous BID  . ketotifen  1 drop Both Eyes Daily  . metoprolol tartrate  75 mg Oral BID  . mometasone-formoterol  2 puff Inhalation BID  . pravastatin  20 mg Oral Daily  . predniSONE  40 mg Oral Q breakfast  . pregabalin  100 mg Oral BID  . traZODone  100 mg Oral QHS  . vancomycin  125 mg Oral QID   Followed by  . [START ON 07/27/2019] vancomycin  125 mg Oral BID   Followed by  . [START ON 08/04/2019] vancomycin  125 mg Oral Daily   Followed by  . [START ON 08/11/2019] vancomycin  125 mg Oral QODAY   Followed by  . [START ON 08/19/2019] vancomycin  125 mg Oral Q3 days   Continuous  Infusions:    LOS: 3 days    Time spent: 30 minutes.   Barton Dubois, MD [facility] Pager [phone]   07/17/2019, 12:31 PM

## 2019-07-17 NOTE — Progress Notes (Signed)
Hypoglycemic Event  CBG: 53  Treatment: 8 oz juice/soda  Symptoms: None  Follow-up CBG: Time:2209 CBG Result:93  Possible Reasons for Event: Unknown  Comments/MD notified:Midlevel notified. New order to hold Lantus tonight.    [name]

## 2019-07-18 ENCOUNTER — Inpatient Hospital Stay (HOSPITAL_COMMUNITY): Payer: Medicare Other

## 2019-07-18 LAB — GLUCOSE, CAPILLARY
Glucose-Capillary: 80 mg/dL (ref 70–99)
Glucose-Capillary: 79 mg/dL (ref 70–99)
Glucose-Capillary: 164 mg/dL — ABNORMAL HIGH (ref 70–99)
Glucose-Capillary: 111 mg/dL — ABNORMAL HIGH (ref 70–99)
Glucose-Capillary: 265 mg/dL — ABNORMAL HIGH (ref 70–99)

## 2019-07-18 NOTE — Progress Notes (Signed)
This nurse was walking down the hall when hearing a shouting "help". This nurse walked into patients room. Patient found lying in the floor at 0554. Assessed patient from floor and vital signs obtained. Patient complaining of new head pain that she rates a 10/10. She describes the pain as an aching pain to the middle of head. Patient is Alert and Oriented x4 at this time. Patient stated that she woke up and didn't know where she was. She then got up to walk to bathroom. Fall mats were in place by bedside. Patient requesting her husband, Marc Morgans, not to be notified at this time. MD notified of pain to head and knee. Patient is noted to be on eliquis for Afib and MD notified. New order for STAT Head CT.  Patient also complaining of right knee pain that she states is chronic for her. Patient assisted back to bed with assistance and transported to CT by this nurse. Patient is now in bed resting at this time. Fall mats in place, call bell within reach, and bed alarm on. Fall risk bracelet placed on patient. MD notified when CT results resulted. Will continue to monitor patient.

## 2019-07-18 NOTE — Care Management Important Message (Signed)
Important Message  Patient Details  Name: [name] MRN: [id number] Date of Birth: 1951/03/07   Medicare Important Message Given:  Yes(given to RN to deliver due to contact precautions)     Tommy Medal 07/18/2019, 1:54 PM

## 2019-07-18 NOTE — Progress Notes (Signed)
Patient continues to get chocked on food, patient got chocked on carrots at dinner last night and lunch now. Patient and husband say that this is a recurrent is and are worried about it. MD made aware.

## 2019-07-18 NOTE — Telephone Encounter (Signed)
Instructions mailed.

## 2019-07-18 NOTE — Progress Notes (Signed)
    Subjective: No abdominal pain, no N/V. [place] this morning on way to bathroom. CT head normal. Stool is forming up, decreasing in frequency. Feels much improved. Eager to go home.   Objective: Vital signs in last 24 hours: Temp:  [98.2 F (36.8 C)-98.6 F (37 C)] 98.2 F (36.8 C) (10/19 0537) Pulse Rate:  [98-117] 98 (10/19 0557) Resp:  [16-20] 20 (10/19 0557) BP: (115-122)/(61-92) 122/92 (10/19 0557) SpO2:  [83 %-98 %] 94 % (10/19 0736) Last BM Date: 07/17/19 General:   Alert and oriented, pleasant Head:  Normocephalic and atraumatic.  Abdomen:  Bowel sounds present, soft, non-tender, non-distended. No HSM or hernias noted. No rebound or guarding. No masses appreciated  Extremities:  Without edema. Neurologic:  Alert and  oriented x4  Intake/Output from previous day: 10/18 0701 - 10/19 0700 In: 1440 [P.O.:1440] Out: -  Intake/Output this shift: Total I/O In: 240 [P.O.:240] Out: -     Studies/Results: Ct Head Wo Contrast  Result Date: 07/18/2019 CLINICAL DATA:  Head trauma, headache. Inpatient fall going to restroom. EXAM: CT HEAD WITHOUT CONTRAST TECHNIQUE: Contiguous axial images were obtained from the base of the skull through the vertex without intravenous contrast. COMPARISON:  None. FINDINGS: Brain: No intracranial hemorrhage, mass effect, or midline shift. Age related atrophy. Mild chronic small vessel ischemia. Cavum septum pellucidum, normal variant. No hydrocephalus. The basilar cisterns are patent. No evidence of territorial infarct or acute ischemia. No extra-axial or intracranial fluid collection. Vascular: No hyperdense vessel. Skull: No fracture or focal lesion. Sinuses/Orbits: Paranasal sinuses and mastoid air cells are clear. The visualized orbits are unremarkable. Other: None. IMPRESSION: No acute intracranial abnormality. No skull fracture. Electronically Signed   By: Keith Rake M.D.   On: 07/18/2019 06:38    Assessment: 68 year old female admitted  with acute UC flare while on Vanc taper for Cdiff, clinically improved from admission with resolution of diarrhea. Clinically appropriate for discharge from a GI standpoint and will follow peripherally.  Plan: Continue prednisone Continue vanc taper as outlined in MAR Continue Colazal 2250 mg TID Will follow peripherally Keep upcoming appt for Nov 12th, 2020  Annitta Needs, PhD, ANP-BC [facility]     LOS: 4 days    07/18/2019, 9:57 AM

## 2019-07-18 NOTE — Progress Notes (Signed)
PROGRESS NOTE    [name]  RKY:[id number] DOB: [birth year] DOA: 07/13/2019 PCP: Jalene Mullet, PA-C     Brief Narrative:  68 y.o. female, with past medical history for A. fib, on Eliquis, chronic back pain, COPD, on room air, DDD, ulcerative colitis, C. difficile colitis, with recent prolonged hospitalization secondary to colitis related to ulcerative colitis and C. difficile colitis, she was discharged on prednisone taper, and vancomycin taper, patient/her husband reports she has been compliant with her regimen, patient reports for last 3 days, she has been having worsening diarrhea, 6-10 times a day, so far she had 6 episodes for today, as well she is having persistent nausea, but no vomiting, no coffee-ground emesis, as well she has been having poor appetite, abdominal pain, crampy, nonradiating, reports some minimal amount of blood in her stool, but report is always been like that, no NSAIDs use, no alcohol use, she is on Eliquis for A. fib, no fever, no chills, no cough, no shortness of breath, no COVID-19 exposure. -in ED patient noted to be in A. fib with RVR requiring Cardizem drip, potassium 3.2, CT abdomen and pelvis significant for left side: Colitis, TRH called for admission.   Assessment & Plan: 1-abdominal pain/diarrhea secondary to colitis -With concern for ulcerative colitis flare versus C. difficile reinfection -Continue p.o. steroids and oral vancomycin -Follow GI service recommendation -C. difficile by PCR positive, toxin results negative. -Continue supportive care, gentle fluid resuscitation and electrolyte repletion. -Patient reporting no abdominal pain currently and her stools are firming up. -PPI discontinued; will continue H2 blocker.. -Patient currently tolerating diet well. -Continue advancing diet as tolerated. -Hopefully discharged in the next 24 to 48 hours; PT consulted.  2-Paroxysmal atrial fibrillation with RVR on presentation(HCC) -Heart rate  improved -Off Cardizem drip for more than 36 hours now. -Blood pressure soft, but stable.  -Patient remains in atrial fibrillation -Will continue adjusted dose of beta-blocker and continue Eliquis for anticoagulation. -Continue correcting potassium and magnesium level as needed -Continue telemetry monitoring   3-hypokalemia -In the setting of GI losses -Continue repletion as needed.  -Magnesium within normal limits currently. -Follow electrolytes trend.  4-COPD -Continue bronchodilators as needed -Patient with no wheezing at this time -Good O2 sat on room air appreciated.  5-anxiety/depression -Mood is stable -No suicidal ideation or hallucination -Continue home medication regiment.  6-GERD -Continue famotidine.  -PPI discontinued in the setting of concerns for C. difficile infection.  7-hyperlipidemia -Continue statins.  8-type 2 diabetes with hyperglycemia -A1c 9.9 -CBGs elevated most likely in the setting of steroids usage, and also early requirement of Cardizem drip (which is  dextrose-based). -Continue moderate sliding scale insulin, Lantus twice a day and NovoLog for meal coverage.  -Follow CBGs fluctuation for further adjustment on hypoglycemic regimen.  9-physical deconditioning -Physical therapy has been consulted and will follow recommendations. -After discussing with husband at bedside, patient is still significantly weak and having difficulty getting out of bed and using the bathroom around. -Patient had a fall earlier this morning while out of bed; CT head was negative for acute fractures or hemorrhage.   DVT prophylaxis: Apixaban. Code Status: Full code Family Communication: Husband at bedside.  Disposition Plan: Remains inpatient on telemetry bed. Continue Lopressor. Continue prednisone and antibiotics as recommended by gastroenterology service. Continue sliding scale insulin and Lantus. Continue to follow CBG's for further hypoglycemic regimen adjustments.  Follow Physical Therapy recommendations. Continue hydration; changed diet to dysphagia 2 due to complaints of having trouble swallowing solids. Hopefully discharge in  the next 24-48 hours.   Consultants:   GI service  Procedures:   See below for x-ray reports.  Antimicrobials:  Anti-infectives (From admission, onward)   Start     Dose/Rate Route Frequency Ordered Stop   08/19/19 1000  vancomycin (VANCOCIN) 50 mg/mL oral solution 125 mg     125 mg Oral Every 3 DAYS 07/13/19 1813 08/28/19 0959   08/11/19 1000  vancomycin (VANCOCIN) 50 mg/mL oral solution 125 mg     125 mg Oral Every other day 07/13/19 1813 08/19/19 0959   08/04/19 1000  vancomycin (VANCOCIN) 50 mg/mL oral solution 125 mg     125 mg Oral Daily 07/13/19 1813 08/11/19 0959   07/27/19 2200  vancomycin (VANCOCIN) 50 mg/mL oral solution 125 mg     125 mg Oral 2 times daily 07/13/19 1813 08/03/19 2159   07/13/19 1815  vancomycin (VANCOCIN) 50 mg/mL oral solution 125 mg  Status:  Discontinued     125 mg Oral Every 6 hours 07/13/19 1813 07/13/19 1818   07/13/19 1815  vancomycin (VANCOCIN) 50 mg/mL oral solution 125 mg     125 mg Oral 4 times daily 07/13/19 1813 07/27/19 1759       Subjective: No fever, no chest pain, no abdominal pain, no nausea, no vomiting, no diarrhea, and no palpitations. Reports that stool continues to firm up. Patient complaining today that she is having trouble swallowing solids; speech therapy consulted and will follow recommendations (dysphagia 2 diet). Continues to have stable CBG's.   Objective: Vitals:   07/18/19 0557 07/18/19 0558 07/18/19 0736 07/18/19 1454  BP: (!) 122/92   119/79  Pulse: 98     Resp: 20     Temp:      TempSrc:      SpO2: (!) 83% 93% 94% 93%  Weight:      Height:        Intake/Output Summary (Last 24 hours) at 07/18/2019 1533 Last data filed at 07/18/2019 1455 Gross per 24 hour  Intake 1440 ml  Output --  Net 1440 ml   Filed Weights   07/13/19 2030 07/14/19  0500 07/15/19 0700  Weight: 81.7 kg 81.7 kg 81.4 kg    Examination: General exam: Alert, awake, oriented x 3. Afebrile. Patient is complaining of trouble swallowing solid food; changed diet to dysphagia 2. Denies abdominal pain, nausea, vomiting and diarrhea.  Respiratory system: Clear to auscultation. Respiratory effort normal. Cardiovascular system: Irregular but rate controlled. No murmurs, rubs or gallops. No JVD.  Gastrointestinal system: Abdomen is nondistended, soft and nontender. No organomegaly or masses felt. Normal bowel sounds heard. Central nervous system: Alert and oriented. No focal neurological deficits. Extremities: No C/C/E, +pedal pulses Skin: No rashes, lesions or ulcers Psychiatry: Judgement and insight appear normal. Mood & affect appropriate.    Data Reviewed: I have personally reviewed following labs and imaging studies  CBC: Recent Labs  Lab 07/13/19 1447 07/14/19 0537  WBC 12.0* 10.9*  NEUTROABS 7.3  --   HGB 12.8 11.3*  HCT 40.6 35.2*  MCV 93.1 92.9  PLT 138* 389   Basic Metabolic Panel: Recent Labs  Lab 07/13/19 1447 07/14/19 0537 07/15/19 0417  NA 134* 138 138  K 3.2* 3.5 3.8  CL 96* 105 103  CO2 24 23 28   GLUCOSE 369* 295* 122*  BUN 9 7* 13  CREATININE 0.79 0.65 0.80  CALCIUM 9.4 8.7* 9.5   GFR: Estimated Creatinine Clearance: 67.6 mL/min (by C-G formula based on SCr of 0.8  mg/dL).   Liver Function Tests: Recent Labs  Lab 07/13/19 1447  AST 8*  ALT 12  ALKPHOS 60  BILITOT 0.9  PROT 5.9*  ALBUMIN 3.2*   Coagulation Profile: Recent Labs  Lab 07/13/19 1447  INR 1.0   CBG: Recent Labs  Lab 07/17/19 2138 07/17/19 2209 07/18/19 0538 07/18/19 0725 07/18/19 1129  GLUCAP 53* 93 80 79 164*   Urine analysis:    Component Value Date/Time   COLORURINE STRAW (A) 07/13/2019 1427   APPEARANCEUR CLEAR 07/13/2019 1427   LABSPEC 1.029 07/13/2019 1427   PHURINE 6.0 07/13/2019 1427   GLUCOSEU >=500 (A) 07/13/2019 1427   HGBUR  NEGATIVE 07/13/2019 1427   BILIRUBINUR NEGATIVE 07/13/2019 1427   KETONESUR 20 (A) 07/13/2019 1427   PROTEINUR NEGATIVE 07/13/2019 1427   NITRITE NEGATIVE 07/13/2019 1427   LEUKOCYTESUR TRACE (A) 07/13/2019 1427    Recent Results (from the past 240 hour(s))  SARS CORONAVIRUS 2 (TAT 6-24 HRS) Nasopharyngeal Nasopharyngeal Swab     Status: None   Collection Time: 07/13/19  5:08 PM   Specimen: Nasopharyngeal Swab  Result Value Ref Range Status   SARS Coronavirus 2 NEGATIVE NEGATIVE Final    Comment: (NOTE) SARS-CoV-2 target nucleic acids are NOT DETECTED. The SARS-CoV-2 RNA is generally detectable in upper and lower respiratory specimens during the acute phase of infection. Negative results do not preclude SARS-CoV-2 infection, do not rule out co-infections with other pathogens, and should not be used as the sole basis for treatment or other patient management decisions. Negative results must be combined with clinical observations, patient history, and epidemiological information. The expected result is Negative. Fact Sheet for Patients: SugarRoll.be Fact Sheet for Healthcare Providers: https://www.woods-mathews.com/ This test is not yet approved or cleared by the [place] FDA and  has been authorized for detection and/or diagnosis of SARS-CoV-2 by FDA under an Emergency Use Authorization (EUA). This EUA will remain  in effect (meaning this test can be used) for the duration of the COVID-19 declaration under Section 56 4(b)(1) of the Act, 21 U.S.C. section 360bbb-3(b)(1), unless the authorization is terminated or revoked sooner. Performed at [facility], [place] [address], [place], [place] [address]   MRSA PCR Screening     Status: None   Collection Time: 07/13/19  7:58 PM   Specimen: Nasal Mucosa; Nasopharyngeal  Result Value Ref Range Status   MRSA by PCR NEGATIVE NEGATIVE Final    Comment:        The GeneXpert MRSA Assay  (FDA approved for NASAL specimens only), is one component of a comprehensive MRSA colonization surveillance program. It is not intended to diagnose MRSA infection nor to guide or monitor treatment for MRSA infections. Performed at [facility], [address], [place] [employer] [place], [place] [address]   C Difficile Quick Screen w PCR reflex     Status: Abnormal   Collection Time: 07/14/19 12:00 PM   Specimen: Stool  Result Value Ref Range Status   C Diff antigen POSITIVE (A) NEGATIVE Final   C Diff toxin NEGATIVE NEGATIVE Final   C Diff interpretation Results are indeterminate. See PCR results.  Final    Comment: Performed at [facility], [address] [place], [place] [address]  C. Diff by PCR, Reflexed     Status: Abnormal   Collection Time: 07/14/19 12:00 PM  Result Value Ref Range Status   Toxigenic C. Difficile by PCR POSITIVE (A) NEGATIVE Final    Comment: Positive for toxigenic C. difficile with little to no  toxin production. Only treat if clinical presentation suggests symptomatic illness. Performed at [facility], [place] [address] [place], [place] [address]      Radiology Studies: Ct Head Wo Contrast  Result Date: 07/18/2019 CLINICAL DATA:  Head trauma, headache. Inpatient fall going to restroom. EXAM: CT HEAD WITHOUT CONTRAST TECHNIQUE: Contiguous axial images were obtained from the base of the skull through the vertex without intravenous contrast. COMPARISON:  None. FINDINGS: Brain: No intracranial hemorrhage, mass effect, or midline shift. Age related atrophy. Mild chronic small vessel ischemia. Cavum septum pellucidum, normal variant. No hydrocephalus. The basilar cisterns are patent. No evidence of territorial infarct or acute ischemia. No extra-axial or intracranial fluid collection. Vascular: No hyperdense vessel. Skull: No fracture or focal lesion. Sinuses/Orbits: Paranasal sinuses and mastoid air cells are clear. The visualized orbits are unremarkable. Other: None.  IMPRESSION: No acute intracranial abnormality. No skull fracture. Electronically Signed   By: Keith Rake M.D.   On: 07/18/2019 06:38    Scheduled Meds:  acidophilus  1 capsule Oral Daily   apixaban  5 mg Oral BID   azelastine  2 spray Each Nare BID   balsalazide  2,250 mg Oral TID   calcium-vitamin D  1 tablet Oral BID   dicyclomine  10 mg Oral BID AC   famotidine  20 mg Oral BID   insulin aspart  0-15 Units Subcutaneous TID WC   insulin aspart  0-5 Units Subcutaneous QHS   insulin aspart  5 Units Subcutaneous TID WC   insulin glargine  15 Units Subcutaneous BID   ketotifen  1 drop Both Eyes Daily   metoprolol tartrate  75 mg Oral BID   mometasone-formoterol  2 puff Inhalation BID   pravastatin  20 mg Oral Daily   predniSONE  40 mg Oral Q breakfast   pregabalin  100 mg Oral BID   traZODone  100 mg Oral QHS   vancomycin  125 mg Oral QID   Followed by   Derrill Memo ON 07/27/2019] vancomycin  125 mg Oral BID   Followed by   Derrill Memo ON 08/04/2019] vancomycin  125 mg Oral Daily   Followed by   Derrill Memo ON 08/11/2019] vancomycin  125 mg Oral QODAY   Followed by   Derrill Memo ON 08/19/2019] vancomycin  125 mg Oral Q3 days   Continuous Infusions:    LOS: 4 days    Time spent: 30 minutes.   Barton Dubois, MD [facility] Pager [phone]   07/18/2019, 3:33 PM

## 2019-07-18 NOTE — Evaluation (Signed)
Clinical/Bedside Swallow Evaluation Patient Details  Name: [name] MRN: [id number] Date of Birth: [birth year]  Today's Date: 07/18/2019 Time: SLP Start Time (ACUTE ONLY): 1350 SLP Stop Time (ACUTE ONLY): 1422 SLP Time Calculation (min) (ACUTE ONLY): 32 min  Past Medical History:  Past Medical History:  Diagnosis Date  . Anemia   . Anginal pain ([place])    PATIENT STATES DOCT THINKS IS FIBROMYALGIA  . Arthritis   . Asthma   . Atrial fibrillation ([place])   . Blood clot in lung last march  . Cancer ([place])    1980  CERVICAL  . Chronic back pain   . COPD (chronic obstructive pulmonary disease) ([place])   . DDD (degenerative disc disease)   . Degenerative tear of posterior horn of lateral meniscus of right knee 01/27/2014  . Depression   . Diabetes mellitus without complication ([place])   . Diverticulitis   . Fibromyalgia   . GERD (gastroesophageal reflux disease)   . Headache    HX MIGRAINES    . Hiatal hernia   . HOH (hard of hearing)    right  . Pancreatitis   . Pneumonia    1 YR  . PONV (postoperative nausea and vomiting)    only happened after knee arthroscopy  . Primary localized osteoarthritis of right knee 12/11/2015  . PVC's (premature ventricular contractions)   . Schatzki's ring   . Seizures ([place])    had febrile seizures as child; none since childhood and on no meds  . Shortness of breath dyspnea    WITH EXERTION   . Sleep apnea    CPAP at night  . Ulcerative colitis 2007  . Wears dentures    full top-partial bottom   Past Surgical History:  Past Surgical History:  Procedure Laterality Date  . arch and foot    . BALLOON DILATION  11/17/2018   Procedure: BALLOON DILATION;  Surgeon: Daneil Dolin, MD;  [facility];  Service: Endoscopy;;  esophagus  . BIOPSY  06/01/2019   Procedure: BIOPSY;  Surgeon: Daneil Dolin, MD;  [facility];  Service: Endoscopy;;  . CERVICAL CONE BIOPSY    . CHOLECYSTECTOMY    . COLONOSCOPY  12/03/05    Rourk-marked inflammatory changes of the rectum, left colon, pan colitis, internal hemorrhoids, sigmoid diverticula  . COLONOSCOPY N/A 10/20/2013   Dr. Rourk:Colonic diverticulosis. colonic mucosal ulcerations involving segment of sigmoid colon, atypical for UC. sigmoid colon path with chronic mildly active colitis consistent with IBD, other biopsies including ascending, transverse, descending colon and rectum unremarkable.   . COLONOSCOPY N/A 11/19/2016   Dr. Gala Romney: Diverticulosis, 7 millimeter polyp removed from the descending colon, internal hemorrhoids, segmental biopsies unremarkable.  Marland Kitchen DILATION AND CURETTAGE OF UTERUS    . ESOPHAGEAL DILATION N/A 09/20/2015   Procedure: ESOPHAGEAL DILATION;  Surgeon: Daneil Dolin, MD;  [facility];  Service: Endoscopy;  Laterality: N/A;  . ESOPHAGOGASTRODUODENOSCOPY  01/28/2011   Rourk- Schatzki ring, s/p 63F, otherwise normal/Hiatal hernia otherwise normal stomach D1 and D2  . ESOPHAGOGASTRODUODENOSCOPY  11/30/08   Rourk-Schatzki's ring status post dilation, hiatal hernia  . ESOPHAGOGASTRODUODENOSCOPY  07/12/2007   Dilation with 56 French, Schatzki's ring  . ESOPHAGOGASTRODUODENOSCOPY N/A 08/10/2014   Dr.Rourk- prominent schatzki's ring s/p maloney dilation and disruption, moderate sized hiatal hernia  . ESOPHAGOGASTRODUODENOSCOPY N/A 09/20/2015   RMR: Schatzki's ring status post dilation as described above. Rather large hiatal hernia; somewhat baggy atonic esophageal body' otherwise normal EGD  . ESOPHAGOGASTRODUODENOSCOPY  N/A 11/19/2016   Dr. Gala Romney: moderate Schatzki ring s/p dirsuption with biopsy forceps, large hiatal hernia  . ESOPHAGOGASTRODUODENOSCOPY N/A 11/17/2018   Procedure: ESOPHAGOGASTRODUODENOSCOPY (EGD);  Surgeon: Daneil Dolin, MD;  [facility];  Service: Endoscopy;  Laterality: N/A;  12:00PM  . ESOPHAGOGASTRODUODENOSCOPY (EGD) WITH ESOPHAGEAL DILATION  09/01/2012   Dr. Gala Romney- schatzki's ring, hiatal hernia  .  ESOPHAGOGASTRODUODENOSCOPY (EGD) WITH PROPOFOL N/A 02/08/2018   Procedure: ESOPHAGOGASTRODUODENOSCOPY (EGD) WITH PROPOFOL;  Surgeon: Daneil Dolin, MD;  [facility];  Service: Endoscopy;  Laterality: N/A;  10:30am  . FLEXIBLE SIGMOIDOSCOPY N/A 06/01/2019   Procedure: FLEXIBLE SIGMOIDOSCOPY;  Surgeon: Daneil Dolin, MD;  [facility];  Service: Endoscopy;  Laterality: N/A;  . FOOT ARTHRODESIS  2007,2010   both feet  . HERNIA REPAIR     umbilical  . JOINT REPLACEMENT Right   . KNEE ARTHROSCOPY WITH LATERAL MENISECTOMY Right 01/27/2014   Procedure: RIGHT KNEE ARTHROSCOPY WITH PARTIAL LATERAL MENISECTOMY, ;  Surgeon: Johnny Bridge, MD;  Location: [facility];  Service: Orthopedics;  Laterality: Right;  . MALONEY DILATION N/A 08/10/2014   Procedure: [name] DILATION;  Surgeon: Daneil Dolin, MD;  [facility];  Service: Endoscopy;  Laterality: N/A;  [name] DILATION N/A 11/19/2016   Procedure: [name] DILATION;  Surgeon: Daneil Dolin, MD;  [facility];  Service: Endoscopy;  Laterality: N/A;  . [name] DILATION N/A 02/08/2018   Procedure: [name] DILATION;  Surgeon: Daneil Dolin, MD;  [facility];  Service: Endoscopy;  Laterality: N/A;  [name] DILATION N/A 11/17/2018   Procedure: [name] DILATION;  Surgeon: Daneil Dolin, MD;  [facility];  Service: Endoscopy;  Laterality: N/A;  . SAVORY DILATION N/A 08/10/2014   Procedure: SAVORY DILATION;  Surgeon: Daneil Dolin, MD;  [facility];  Service: Endoscopy;  Laterality: N/A;  . SHOULDER OPEN ROTATOR CUFF REPAIR     BILAT  . TONSILLECTOMY    . TOTAL KNEE ARTHROPLASTY Right 12/11/2015   Procedure: TOTAL KNEE ARTHROPLASTY;  Surgeon: Marchia Bond, MD;  Location: [facility];  Service: Orthopedics;  Laterality: Right;   HPI:  68 y.o. female, with past medical history for A. fib, on Eliquis, chronic back pain, COPD, on room air, DDD, ulcerative colitis,  C. difficile colitis, with recent prolonged hospitalization secondary to colitis related to ulcerative colitis and C. difficile colitis, she was discharged on prednisone taper, and vancomycin taper, patient/her husband reports she has been compliant with her regimen, patient reports for last 3 days, she has been having worsening diarrhea, 6-10 times a day, so far she had 6 episodes for today, as well she is having persistent nausea, but no vomiting, no coffee-ground emesis, as well she has been having poor appetite, abdominal pain, crampy, nonradiating, reports some minimal amount of blood in her stool, but report is always been like that, no NSAIDs use, no alcohol use, she is on Eliquis for A. fib, no fever, no chills, no cough, no shortness of breath, no COVID-19 exposure. BSE requested due to Pt getting choked on carrots. Pt had EGD 11/17/18 with Dr. Gala Romney with need for disruption of Schatzki's ring.   Assessment / Plan / Recommendation Clinical Impression  Clinical swallow evaluation completed at bedside with Pt's husband present. Pt reports h/o esophageal dilation. Her last was in February of 2020 and she reported a relief in her symptoms. She also indicates that she has not had  any issues with swallowing until a few days ago, coinciding with this admission. Upon arrival to the room, the Pt was reclined in bed consuming vanilla ice cream. She was repositioned upright. Her husband assured SLP that when she choked on carrots, she was sitting upright. Oral motor examination is unremarkable except for edentulous status. Pt recently had some of her lower teeth extracted and is unable to wear her lower partial. Her upper partial is at home. Pt indicates that she was unable to masticate her foods well and became "choked" when eating carrots and breads earlier. She exhibited no overt signs of reduced airway protection today during trials of ice chips, cup and straw sips of thin liquids, puree, and small pieces of  graham cracker. Pt reports that she consumes mashed potatoes and soup without difficulty. Given recent h/o "choking" on solids and edentulous status, only small pieces of solids were presented. Pt with known h/o of esophageal dysphagia and may benefit from esophageal assessment if her symptoms persist. For now, downgrade to D2 and thin liquids and SLP will follow for diet tolerance and needs as appropriate.   SLP Visit Diagnosis: Dysphagia, unspecified (R13.10)    Aspiration Risk  Mild aspiration risk    Diet Recommendation Dysphagia 2 (Fine chop);Thin liquid   Liquid Administration via: Cup;Straw Medication Administration: Whole meds with liquid Supervision: Patient able to self feed Compensations: Small sips/bites;Slow rate Postural Changes: Seated upright at 90 degrees;Remain upright for at least 30 minutes after po intake    Other  Recommendations Recommended Consults: Consider esophageal assessment Oral Care Recommendations: Oral care BID;Patient independent with oral care Other Recommendations: Clarify dietary restrictions   Follow up Recommendations None      Frequency and Duration min 2x/week  1 week       Prognosis Prognosis for Safe Diet Advancement: Good Barriers to Reach Goals: (h/o esophageal dsyphagia)      Swallow Study   General Date of Onset: 07/13/19 HPI: 68 y.o. female, with past medical history for A. fib, on Eliquis, chronic back pain, COPD, on room air, DDD, ulcerative colitis, C. difficile colitis, with recent prolonged hospitalization secondary to colitis related to ulcerative colitis and C. difficile colitis, she was discharged on prednisone taper, and vancomycin taper, patient/her husband reports she has been compliant with her regimen, patient reports for last 3 days, she has been having worsening diarrhea, 6-10 times a day, so far she had 6 episodes for today, as well she is having persistent nausea, but no vomiting, no coffee-ground emesis, as well she  has been having poor appetite, abdominal pain, crampy, nonradiating, reports some minimal amount of blood in her stool, but report is always been like that, no NSAIDs use, no alcohol use, she is on Eliquis for A. fib, no fever, no chills, no cough, no shortness of breath, no COVID-19 exposure. BSE requested due to Pt getting choked on carrots. Pt had EGD 11/17/18 with Dr. Gala Romney with need for disruption of Schatzki's ring. Type of Study: Bedside Swallow Evaluation Previous Swallow Assessment: EGD 11/17/18 with Schatzki's ring Diet Prior to this Study: Dysphagia 3 (soft);Thin liquids Temperature Spikes Noted: No Respiratory Status: Room air History of Recent Intubation: No Behavior/Cognition: Alert;Cooperative;Pleasant mood Oral Cavity Assessment: Within Functional Limits Oral Care Completed by SLP: No Oral Cavity - Dentition: Edentulous(Pt with recent lower dental extractions, dentures at home) Vision: Functional for self-feeding Self-Feeding Abilities: Able to feed self Patient Positioning: Upright in bed Baseline Vocal Quality: Normal Volitional Cough: Strong Volitional Swallow: Able to  elicit    Oral/Motor/Sensory Function Overall Oral Motor/Sensory Function: Within functional limits   Ice Chips Ice chips: Within functional limits Presentation: Spoon   Thin Liquid Thin Liquid: Within functional limits Presentation: Cup;Self Fed;Straw    Nectar Thick Nectar Thick Liquid: Not tested   Honey Thick Honey Thick Liquid: Not tested   Puree Puree: Within functional limits Presentation: Self Fed;Spoon   Solid     Solid: Impaired Presentation: Self Fed Oral Phase Impairments: Impaired mastication Other Comments: Pt broke off small pieces     Thank you,  Genene Churn, [place] 07/18/2019,3:25 PM

## 2019-07-19 LAB — GI PATHOGEN PANEL BY PCR, STOOL

## 2019-07-19 LAB — GLUCOSE, CAPILLARY
Glucose-Capillary: 115 mg/dL — ABNORMAL HIGH (ref 70–99)
Glucose-Capillary: 192 mg/dL — ABNORMAL HIGH (ref 70–99)
Glucose-Capillary: 180 mg/dL — ABNORMAL HIGH (ref 70–99)
Glucose-Capillary: 150 mg/dL — ABNORMAL HIGH (ref 70–99)

## 2019-07-19 MED ORDER — LORAZEPAM 1 MG PO TABS
1.0000 mg | ORAL_TABLET | Freq: Once | ORAL | Status: DC
  Filled 2019-07-19: qty 1

## 2019-07-19 NOTE — NC FL2 (Signed)
[place] LEVEL OF CARE SCREENING TOOL     IDENTIFICATION  Patient Name: [name] Birthdate: [birth year] Sex: female Admission Date (Current Location): 07/13/2019  [facility] and [place] Number:  [employer] and Address:  [place] [address], [place]      Provider Number: [id number]  Attending Physician Name and Address:  Barton Dubois, MD  Relative Name and Phone Number:       Current Level of Care: Hospital Recommended Level of Care: [place] Prior Approval Number:    Date Approved/Denied:   PASRR Number:    Discharge Plan: SNF    Current Diagnoses: Patient Active Problem List   Diagnosis Date Noted  . Nausea   . Paroxysmal atrial fibrillation (HCC)   . Hematochezia   . IBD (inflammatory bowel disease)   . Tachycardia   . Enteritis due to Clostridium difficile   . Colitis 05/21/2019  . IDA (iron deficiency anemia) 08/11/2018  . Diarrhea 01/06/2018  . Primary localized osteoarthritis of right knee 12/11/2015  . 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  . Hiatal hernia   . Schatzki's ring   . Degenerative tear of posterior horn of lateral meniscus of right knee 01/27/2014  . Ulcerative colitis ([place]) 01/09/2011  . Dysphagia 01/09/2011  . CONSTIPATION 08/01/2009  . LLQ pain 08/01/2009  . GERD 06/14/2009  . ULCERATIVE COLITIS--UNIVERSAL ULCERATIVE COLITIS 06/14/2009    Orientation RESPIRATION BLADDER Height & Weight     Self, Time, Situation, Place  Normal Continent Weight: 179 lb 7.3 oz (81.4 kg) Height:  5' 4"  (162.6 cm)  BEHAVIORAL SYMPTOMS/MOOD NEUROLOGICAL BOWEL NUTRITION STATUS      Continent Diet(see dc summary)  AMBULATORY STATUS COMMUNICATION OF NEEDS Skin   Extensive Assist Verbally Normal                       Personal Care Assistance Level of Assistance  Bathing, Dressing, Feeding Bathing Assistance: Limited assistance Feeding assistance:  Independent Dressing Assistance: Limited assistance     Functional Limitations Info  Sight, Hearing, Speech Sight Info: Adequate Hearing Info: Adequate Speech Info: Adequate    SPECIAL CARE FACTORS FREQUENCY  PT (By licensed PT), OT (By licensed OT)     PT Frequency: 5 times week OT Frequency: 3 times week            Contractures Contractures Info: Not present    Additional Factors Info  Code Status, Allergies, Psychotropic Code Status Info: full Allergies Info: Cefprozil, Cefuroxime Axetil, Morphine and related, Penicillins, Eggs or Egg derived products, Turkey-sweet potatotes-peaches (alitraq), Aspirin, Chicken, Clarithromycin, Codeine, Entex Psychotropic Info: Wellbutrin, Buspar, Cymbalta, Trazadone         Current Medications (07/19/2019):  This is the current hospital active medication list Current Facility-Administered Medications  Medication Dose Route Frequency Provider Last Rate Last Dose  . acetaminophen (TYLENOL) tablet 650 mg  650 mg Oral Q6H PRN Barton Dubois, MD   650 mg at 07/13/19 2301   Or  . acetaminophen (TYLENOL) suppository 650 mg  650 mg Rectal Q6H PRN Barton Dubois, MD      . acidophilus (RISAQUAD) capsule 1 capsule  1 capsule Oral Daily Barton Dubois, MD   1 capsule at 07/19/19 [phone]  . albuterol (PROVENTIL) (2.5 MG/3ML) 0.083% nebulizer solution 2.5 mg  2.5 mg Nebulization Q6H PRN Barton Dubois, MD      . apixaban (ELIQUIS) tablet 5 mg  5 mg Oral BID Dyann Kief,  Paula James, MD   5 mg at 07/19/19 0836  . azelastine (ASTELIN) 0.1 % nasal spray 2 spray  2 spray Each Nare BID Barton Dubois, MD   2 spray at 07/19/19 6948  . balsalazide (COLAZAL) capsule 2,250 mg  2,250 mg Oral TID Barton Dubois, MD   2,250 mg at 07/19/19 5462  . calcium-vitamin D (OSCAL WITH D) 500-200 MG-UNIT per tablet 1 tablet  1 tablet Oral BID Barton Dubois, MD   1 tablet at 07/19/19 [phone]  . dextrose 50 % solution 25 mL  25 mL Intravenous PRN Barton Dubois, MD      . dicyclomine  (BENTYL) capsule 10 mg  10 mg Oral BID Maebelle Munroe, MD   10 mg at 07/19/19 0835  . famotidine (PEPCID) tablet 20 mg  20 mg Oral BID Barton Dubois, MD   20 mg at 07/19/19 0836  . hydrocortisone (ANUSOL-HC) 2.5 % rectal cream   Rectal BID PRN Barton Dubois, MD      . insulin aspart (novoLOG) injection 0-15 Units  0-15 Units Subcutaneous TID [facility] Barton Dubois, MD   3 Units at 07/18/19 1158  . insulin aspart (novoLOG) injection 0-5 Units  0-5 Units Subcutaneous QHS Barton Dubois, MD   3 Units at 07/18/19 2155  . insulin aspart (novoLOG) injection 5 Units  5 Units Subcutaneous TID WC Barton Dubois, MD   5 Units at 07/19/19 0840  . insulin glargine (LANTUS) injection 15 Units  15 Units Subcutaneous BID Barton Dubois, MD   15 Units at 07/19/19 0840  . ketotifen (ZADITOR) 0.025 % ophthalmic solution 1 drop  1 drop Both Eyes Daily Barton Dubois, MD   1 drop at 07/19/19 0841  . metoprolol tartrate (LOPRESSOR) injection 5 mg  5 mg Intravenous Q6H PRN Barton Dubois, MD   5 mg at 07/17/19 1733  . metoprolol tartrate (LOPRESSOR) tablet 75 mg  75 mg Oral BID Barton Dubois, MD   75 mg at 07/19/19 0093  . mometasone-formoterol (DULERA) 200-5 MCG/ACT inhaler 2 puff  2 puff Inhalation BID Barton Dubois, MD   2 puff at 07/19/19 0729  . pravastatin (PRAVACHOL) tablet 20 mg  20 mg Oral Daily Barton Dubois, MD   20 mg at 07/19/19 0835  . predniSONE (DELTASONE) tablet 40 mg  40 mg Oral Q breakfast Barton Dubois, MD   40 mg at 07/19/19 8182  . pregabalin (LYRICA) capsule 100 mg  100 mg Oral BID Barton Dubois, MD   100 mg at 07/19/19 9937  . traZODone (DESYREL) tablet 100 mg  100 mg Oral QHS Barton Dubois, MD   100 mg at 07/18/19 2155  . vancomycin (VANCOCIN) 50 mg/mL oral solution 125 mg  125 mg Oral QID Barton Dubois, MD   125 mg at 07/19/19 1696   Followed by  . [START ON 07/27/2019] vancomycin (VANCOCIN) 50 mg/mL oral solution 125 mg  125 mg Oral BID Barton Dubois, MD       Followed by  . [START ON  08/04/2019] vancomycin (VANCOCIN) 50 mg/mL oral solution 125 mg  125 mg Oral Daily Barton Dubois, MD       Followed by  . [START ON 08/11/2019] vancomycin (VANCOCIN) 50 mg/mL oral solution 125 mg  125 mg Oral Terie Purser, MD       Followed by  . [START ON 08/19/2019] vancomycin (VANCOCIN) 50 mg/mL oral solution 125 mg  125 mg Oral Q3 days Barton Dubois, MD         Discharge Medications:  Please see discharge summary for a list of discharge medications.  Relevant Imaging Results:  Relevant Lab Results:   Additional Information SSN: [id number] [place]

## 2019-07-19 NOTE — TOC Progression Note (Signed)
Close Window Medicare.gov - the Conservation officer, historic buildings for [employer] Compare General information You have selected General information. Nursing Home General Information Nursing Home General Information  Close [place] [facility]  Overall rating Contextual help text to be added.: 3 out of [place], [place] [address] [phone] Distance From the center of the city searched: 8.1 miles   Close [place]  Overall rating Contextual help text to be added.: 1 out of [place] Average   [address] [place] [address] [phone] Distance From the center of the city searched: 8.2 miles   Close [place]  Overall rating Contextual help text to be added.: [place] [address] [phone] Distance From the center of the city searched: 9.8 miles   Health inspection rating Medicare assigns the star rating based on a nursing home's weighted score from recent health inspections. More stars means fewer health risks. 3 out of 5 starsAverage 1 out of 5 starsMuch Below Average Too New to Rate1 Staffing rating Medicare assigns the star rating based on the nursing home's staffing hours for Registered Nurses (RNs), Licensed Astronomer (LPNs), Licensed Vocational Nurses (LVNs), and Nurse aides. More stars means a better level of staffing per nursing home resident. 2 out of 5 starsBelow Average 2 out of 5 starsBelow Average Too New to Rate1 Quality measures rating Medicare assigns the star rating based on data from a select set of clinical data measures. More stars means better quality of care. 3 out of 5 starsAverage 1 out of 5 starsMuch Below Average Too New to [place] Number of certified beds Beds in the nursing home that have been approved by the federal government to participate in  [place] or Medicaid. 112 140 300 Participation: (Medicare/Medicaid) Shows if the nursing home participates in the Medicare, Medicaid, or both. Medicare and Medicaid Medicare and Medicaid Medicare and Medicaid Automatic sprinkler systems: in all required areas One way to make nursing homes safe is to install a water sprinkler system in case of fire. In order to participate in Medicare or Medicaid, CMS requires that nursing homes have an automatic sprinkler system installed. Yes Yes Yes Within a [facility]) [facility]) offer multiple housing options and levels of care. A nursing facility, or nursing home, is typically the most service-intensive housing option. Residents may move from one level to another based on their particular needs, while typically still remaining in the [facility]. No No No Within a hospital Some individuals require a more intensive level of care that can only be provided in a hospital setting. Some Skilled Nursing Facilities (SNF) are located within a hospital, allowing residents to be transferred to an acute care setting more easily. No No No With a resident and family Advertising account planner and family councils can facilitate communications with staff. The law requires nursing homes to allow councils to be set up by residents and families. If a nursing home doesn't have a resident and family council, ask the nursing home administrator why. Ask to talk with a council president to get a sense of how the nursing home has acted on their concerns. Multimedia programmer only Multimedia programmer only Multimedia programmer only [place] [employer]  [place] homes can be run by private for-profit corporations, non-profit corporations, religious  affiliated organizations or government entities. For [place] more ownership information For [place] more ownership information [place] more ownership Physicist, medical This page of [place] reports information about a nursing home's health inspections, complaints files and any resulting citations. Nursing homes that are certified by Medicare and Medicaid are inspected each year. Health care professionals inspect each nursing home and look for any health and safety citations. Learn more.- Opens in a new window  The health inspection star rating- Opens in a new window is based on each active provider's current health inspection survey and the 2 prior surveys, as well as findings from the most recent 3 years of complaints information and inspection revisits.  Nursing Home Inspection Results Measure [place] Inspection Results  Close [place] [facility]  Overall rating Contextual help text to be added.: 3 out of 5 starsAverage   Close [place]  Overall rating Contextual help text to be added.: 1 out of 5 starsMuch Below Average   Close [place]  Overall rating Contextual help text to be added.: Too New to [place] inspection ratingMedicare assigns the star rating based on a nursing home's weighted score from recent health inspections. More stars means fewer health risks. 3 out of 5 starsAverage 1 out of 5 starsMuch Below Average Too New to Rate1 Date of most recent health inspection 10/07/2018 View full report 07/16/2018 View full report 10/28/2018 View full report arrowTotal number of health citations 5 35 2 Average number of health citations in [place] 5.0 Average number of health citations in the [place] 8.3 Date(s) of complaint inspection(s) between 12/28/2017 -  11/30/2018  10/07/2018 View full report 07/16/2018 View full report No Complaint Inspections Number of complaints in the past 3 years that resulted in a citationEach state has an agency where anyone can file a complaint about a problem or concern with a nursing home. If a complaint is filed, the state may decide an inspection is needed. If a complaint results in an inspection that finds citations, it's counted on this website and the citations are included in the nursing home's rating. 2 23 0 Number of times in the past 3 years a facility-reported issue resulted in a citationNursing homes are required by law to report to the state any time a resident was harmed or in danger of being harmed. If this happens, the state may decide an inspection is needed. If a report results in an inspection that finds citations, it's counted on this website and the citations are included in the nursing home's rating. 0 0 0 See all health inspection details View all health inspection, complaint, and facility-reported issue details View all health inspection, complaint, and facility-reported issue details View all health inspection, complaint, and facility-reported issue details Materials engineer Higher staffing levels in a nursing home may mean higher quality of care for residents. This section provides information about the different types of nursing home staff and the average amount of time per resident that they spend providing care.  Get more information about the staffing measures- Opens in a new window  [place]  Close [place] & [facility]  Overall rating Contextual help text to be added.: 3  out of 5 starsAverage   Close [place]  Overall rating Contextual help text to be added.: 1 out of 5 starsMuch Below Average   Close [place]  Overall rating Contextual help text to be added.: Too New to [place]   Staffing Staffing rating 2 out of 5 starsBelow Average 2 out of 5 starsBelow Average Too New to Rate1 The information in this section includes registered nurses (RN), Best boy nurses (LPN/LVN), nurse aides, and physical therapists (PT). Physical therapists aren't included in the "staffing" star rating.  The "staffing" star rating takes into account that some nursing homes have sicker residents and may therefore need more staff than other nursing homes whose residents aren't as sick. Measure Description [place] AND [facility] [place] AVERAGE Average number of residents per day 106.8 114.1 Not Available10 97.6 86.1 Total number of licensed nurse staff hours per resident per day 1 hour and 18 minutes 1 hour and 23 minutes Not Available6 1 hour and 41 minutes 1 hour and 34 minutes RN hours per resident per day 21 minutes 29 minutes Not Available6 38 minutes 41 minutes LPN/LVN hours per resident per day 57 minutes 54 minutes Not Available6 1 hour and 2 minutes 52 minutes Nurse aide hours per resident per dayNurse aides are not considered "licensed nurse staff" and are not included in the calculation for "total number of licensed nurse staff hours per resident per day." 2 hours and 8 minutes 1 hour and 50 minutes Not Available6 2 hours and 7 minutes 2 hours and 18 minutes Physical therapist staff hours per resident per dayPhysical therapists (PTs) are licensed healthcare professionals who provide treatment to help residents gain or maintain physical function. PT staffing hours aren't included in the overall staffing star rating calculation for nursing homes. This measure shows the average time physical therapists (PTs) spend providing care to residents throughout the nursing home. Not all  nursing home residents require physical therapy. 6 minutes 0 minutes Not Available6 5 minutes 5 minutes Registered Nurse (RN) staffing only Registered Nurse (RN) staffing rating 2 out of 5 starsBelow Average 2 out of 5 starsBelow Average Too New to [employer] Registered nurses (RNs) are licensed healthcare professionals who are responsible for the coordination, management and overall delivery of care to the residents. Some nursing home residents who are sicker than others may require a greater level of care, and nursing homes that have more RN staff may be better able to meet the needs of those residents. Average number of residents per day 106.8 114.1 Not Available10 97.6 86.1 RN hours per resident per day 21 minutes 29 minutes Not Available6 38 minutes 41 minutes Quality of resident care Quality of resident care Nursing homes that are certified by [place] in a new window and Medicaid- Opens in a new window regularly report clinical information for each of their residents to the [employer] for [employer] & Medicaid Services (CMS). For short-stay and long-stay resident quality measures, CMS assigns nursing homes a quality of resident care star rating based on their performance on 16 measures. These, and other measures reflect, on average, how well nursing homes care for their residents. Information is listed for 2 groups of residents:  Short-stay residents - those who spent 100 days or less in a nursing home or residents covered under the Medicare Part [place] (SNF) benefit- Opens in a new  window Long-stay residents - those who spent over 100 days in a nursing home Learn more about what quality of resident care information can tell you about a nursing home- Opens in a new window  Quality of Resident Care Measures Periods Measure [place]  Close [place] & [facility]  Overall rating Contextual help text to be added.: 3 out of 5  starsAverage   Close [place]  Overall rating Contextual help text to be added.: 1 out of 5 starsMuch Below Average   Close [place]  Overall rating Contextual help text to be added.: Too New to Rate1   Quality of resident care Medicare assigns the star rating based on data from a select set of clinical data measures. More stars means better quality of care. 3 out of 5 starsAverage 1 out of 5 starsMuch Below Average Too New to Rate1 Short-stay quality of resident care The short-stay quality of care rating reflects the quality of care delivered to temporary residents, and whose typical goal is to improve their health status so they can return to their previous setting, like their home. More stars mean better quality of care. 2 out of 5 starsBelow Average 2 out of 5 starsBelow Average Too New to Rate1 expand Short-stay residents Learn why these short-stay measures are important Current data collection period [place] Residents [place] Short-stay quality of resident care The short-stay quality of care rating reflects the quality of care delivered to temporary residents, and whose typical goal is to improve their health status so they can return to their previous setting, like their home. More stars mean better quality of care. 2 out of 5 starsBelow Average 2 out of 5 starsBelow Average Too New to Rate1 Measures used to calculate the star rating - Short-stay residents Percentage of short-stay residents who were re-hospitalized after a nursing home admission. Lower percentages are better. 23.5% 22.1% Not Available10 19.7% 20.8% Percentage of short-stay residents who have had an outpatient emergency department visit. Lower percentages are  better. 7.1% 8.0% Not Available10 10.7% 10.3% Percentage of short-stay residents who got antipsychotic medication for the first time.Antipsychotic medications can be used to treat certain mental health conditions. Lower percentages are better. 2.0% 4.7% Not Available10 1.7% 1.8% Percentage of SNF residents with pressure ulcers that are new or worsened. Lower percentages are better. 1.5% 1.1% Not Available10 1.4% 1.4% Percentage of short-stay residents who improved in their ability to move around on their own.For example, transferring in and out of bed, and walking. Higher percentages are better. 45.9% 66.3% Not Available10 68.3% 68.0% Flu and pneumonia prevention measures - Short-stay residents Percentage of short-stay residents who needed and got a flu shot for the current flu season. Higher percentages are better. 76.6% 78.7% Not Available10 84.3% 82.9% Percentage of short-stay residents who needed and got a vaccine to prevent pneumonia. Higher percentages are better. 77.6% 87.4% Not Available10 81.8% 83.9% Additional quality measures - Short-stay residents Percentage of SNF residents who experience one or more falls with major injury during their SNF stay. Lower percentages are better. 0.0% 1.5% Not Available1,10 NOT AVAILABLE 0.9% Percentage of SNF residents whose functional abilities were assessed and functional goals were included in their treatment plan.The ability to perform daily activities, including bathing, grooming, dressing, and walking. Higher percentages are better. 100.0% 100.0% Not Available1,10 NOT  AVAILABLE 99.0% Rate of successful return to home and community from a SNF.This measure is also used in the [place]. Higher rates are better. Get results for this SNF Get results for this SNF Get results for this SNF NOT AVAILABLE 49.2% Rate of potentially preventable hospital readmissions 30 days after discharge from a SNF. Lower rates are better. Get results  for this SNF Get results for this SNF Get results for this SNF NOT AVAILABLE 7.3% Medicare Spending Per Beneficiary (MSPB) for residents in SNFs.This measure shows whether Medicare spends more, less, or about the same on an episode of care for a Medicare resident treated in a specific SNF compared to how much Medicare spends on an episode of care across all SNFs nationally. Displayed as a ratio. Get results for this SNF Get results for this SNF Get results for this SNF NOT AVAILABLE 1.01 Long-stay quality of resident care The long-stay quality of care rating reflects the quality of care delivered to long-term residents, and whose typical goal is to maintain or attain their highest possible well-being while residing in the facility. More stars mean better quality of care. 4 out of 5 starsAbove Average 1 out of 5 starsMuch Below Average Too New to Rate1 expand Long-stay residents Learn why these long-stay measures are important Current data collection period [place] Residents [place] quality of resident care The long-stay quality of care rating reflects the quality of care delivered to long-term residents, and whose typical goal is to maintain or attain their highest possible well-being while residing in the facility. More stars mean better quality of care. 4 out of 5 starsAbove Average 1 out of 5 starsMuch Below Average Too New to Rate1 Measures used to calculate the star rating - Long-stay residents Number of hospitalizations per 1,000 long-stay resident days. Lower numbers are better. 1.17 2.38 Not Available10 1.48 1.70 Number of outpatient emergency department visits per 1,000 long-stay resident days. Lower numbers are better. 1.21 1.69 Not Available10 0.93 0.96 Percentage of long-stay residents who got an  antipsychotic medication. Antipsychotic medications can be used to treat certain mental health conditions. Lower percentages are better. 0.6% 18.7% Not Available10 14.2% 14.2% Percentage of long-stay residents experiencing one or more falls with major injury. Lower percentages are better. 4.1% 3.8% Not Available10 3.5% 3.4% Percentage of long-stay high-risk residents with pressure ulcers. Lower percentages are better. 3.6% 6.5% Not Available10 7.7% 7.3% Percentage of long-stay residents with a urinary tract infection. Lower percentages are better. 5.3% 3.8% Not Available10 3.2% 2.6% Percentage of long-stay residents who have or had a catheter inserted and left in their bladder.A catheter is a tube placed in the body to drain and collect urine from the bladder. Lower percentages are better. 0.3% 1.6% Not Available10 1.3% 1.8% Percentage of long-stay residents whose ability to move independently worsened. Lower percentages are better. 21.6% 28.2% Not Available10 19.8% 17.1% Percentage of long-stay residents whose need for help with daily activities has increased.Daily activities include tasks such as: bathing, grooming, dressing, eating, toileting, bed mobility and moving from bed to chair. Lower percentages are better. 18.1% 22.3% Not Available10 15.5% 14.5% Flu and pneumonia prevention measures - Long-stay residents Percentage of long-stay residents who needed and got a flu shot for the current flu season. Higher percentages are better. 97.9% 100.0% Not Available10 96.0% 96.0% Percentage of long-stay residents who needed and got  a vaccine to prevent pneumonia. Higher percentages are better. 98.9% 94.7% Not Available10 90.7% 93.9% Additional quality measures - Long-stay residents Percentage of long-stay residents who were physically restrained. Lower percentages are better. 0.0% 0.0% Not Available10 0.2% 0.2% Percentage of long-stay low-risk residents who lose control of their bowels or  bladder. Lower percentages are better. 46.8% 51.1% Not Available10 54.6% 48.4% Percentage of long-stay residents who lose too much weight. Lower percentages are better. 3.1% 7.4% Not Available10 5.9% 5.5% Percentage of long-stay residents who have symptoms of depression. Lower percentages are better. 2.0% 0.0% Not Available10 2.3% 5.1% Percentage of long-stay residents who got an antianxiety or hypnotic medication.Antianxiety and hypnotic medication can be used to treat certain mental health conditions. Lower percentages are better. 37.1% 35.6% Not Available10 20.1% 19.7% Penalties Penalties When a nursing home gets a serious citation or fails to correct a citation for a long period of time, this can result in a penalty. A penalty can be a fine against the nursing home or a denied payment from Medicare.  Search for penalties under state law.- Opens in a new window Learn more about penalties.- Opens in a new window [place]  Close [place] [facility]  Overall rating Contextual help text to be added.: 3 out of 5 starsAverage   Close [place]  Overall rating Contextual help text to be added.: 1 out of 5 starsMuch Below Average   Close [place]  Overall rating Contextual help text to be added.: Too New to [employer] fines in the last 3 years 1 1 0 Amount(s) and date(s) $8,193 on 10/07/2018 $43,355 on 03/02/2017 0 Payment denials by Medicare in the last 3 years 0 0 0 Date(s) This nursing home hasn't received any payment denials in the last 3 years. This nursing home hasn't received any payment denials in the last 3 years. This nursing home hasn't received any payment denials in the last 3 years.

## 2019-07-19 NOTE — TOC Progression Note (Signed)
Transition of Care [facility]) - Progression Note    Patient Details  Name: [name] MRN: [id number] Date of Birth: 05-31-1951  Transition of Care [facility]) CM/SW Contact  Shade Flood, LCSW Phone Number: 07/19/2019, 11:37 AM  Clinical Narrative:     TOC following. MD indicating pt likely will need SNF rehab. Awaiting PT consult. Spoke with pt who would like to go to SNF rehab. She states she has been at [facility] in the past and she would like to go there again. Discussed CMS SNF list with pt for her reference.   Referred to [facility] and they have accepted. Pt needs a more updated Covid test result. Informed MD.   Will follow and assist with dc needs.   Expected Discharge Plan: Home/Self Care Barriers to Discharge: Continued Medical Work up  Expected Discharge Plan and Services Expected Discharge Plan: Home/Self Care     Post Acute Care Choice: Durable Medical Equipment Living arrangements for the past 2 months: Single Family Home                                       Social Determinants of Health (SDOH) Interventions    Readmission Risk Interventions Readmission Risk Prevention Plan 06/15/2019 06/15/2019 06/09/2019  Post Dischage Appt - - -  Transportation Screening - - Complete  PCP or Specialist Appt within 3-5 Days Not Complete - -  Not Complete comments Left message for hospital discharge follow up coordinator at PCP office requesting return contact to schedule appointment. - -  HRI or Home Care Consult - Complete Not Complete  HRI or Home Care Consult comments - - too soon to know if patient will need any services, at baseline she does not  Social Work Consult for [place] Planning/Counseling - - Complete  Palliative Care Screening - Not Applicable Not Applicable  Medication Review (RN Care Manager) - - Complete  Some recent data might be hidden

## 2019-07-19 NOTE — Progress Notes (Signed)
PROGRESS NOTE    [name]  MBW:[id number] DOB: [birth year] DOA: 07/13/2019 PCP: Jalene Mullet, PA-C     Brief Narrative:  68 y.o. female, with past medical history for A. fib, on Eliquis, chronic back pain, COPD, on room air, DDD, ulcerative colitis, C. difficile colitis, with recent prolonged hospitalization secondary to colitis related to ulcerative colitis and C. difficile colitis, she was discharged on prednisone taper, and vancomycin taper, patient/her husband reports she has been compliant with her regimen, patient reports for last 3 days, she has been having worsening diarrhea, 6-10 times a day, so far she had 6 episodes for today, as well she is having persistent nausea, but no vomiting, no coffee-ground emesis, as well she has been having poor appetite, abdominal pain, crampy, nonradiating, reports some minimal amount of blood in her stool, but report is always been like that, no NSAIDs use, no alcohol use, she is on Eliquis for A. fib, no fever, no chills, no cough, no shortness of breath, no COVID-19 exposure. -in ED patient noted to be in A. fib with RVR requiring Cardizem drip, potassium 3.2, CT abdomen and pelvis significant for left side: Colitis, TRH called for admission.   Assessment & Plan: 1-abdominal pain/diarrhea secondary to colitis -With concern for ulcerative colitis flare versus C. difficile reinfection -Continue p.o. steroids and oral vancomycin -Follow GI service recommendation -C. difficile by PCR positive, toxin results negative. -Continue supportive care, gentle fluid resuscitation and electrolyte repletion. -Patient reporting no abdominal pain currently and her stools are firming up. -PPI discontinued; will continue H2 blocker.. -Patient currently tolerating diet well. -Continue advancing diet as tolerated. -Hopefully discharged in the next 24-48 hours; PT recommends SNF; waiting on COVID test results.  2-Paroxysmal atrial fibrillation with RVR on  presentation(HCC) -Heart rate improved -Off Cardizem drip for more than 36 hours now. -Blood pressure soft, but stable.  -Patient remains in atrial fibrillation -Will continue adjusted dose of beta-blocker and continue Eliquis for anticoagulation. -Continue correcting potassium and magnesium level as needed -Continue telemetry monitoring   3-hypokalemia -In the setting of GI losses -Continue repletion as needed.  -Magnesium within normal limits currently. -Follow electrolytes trend.  4-COPD -Continue bronchodilators as needed -Patient with no wheezing at this time -Good O2 sat on room air appreciated.  5-anxiety/depression -Mood is stable -No suicidal ideation or hallucination -Continue home medication regiment.  6-GERD -Continue famotidine.  -PPI discontinued in the setting of concerns for C. difficile infection.  7-hyperlipidemia -Continue statins.  8-type 2 diabetes with hyperglycemia -A1c 9.9 -CBGs elevated most likely in the setting of steroids usage, and also early requirement of Cardizem drip (which is  dextrose-based). -Continue moderate sliding scale insulin, Lantus twice a day and NovoLog for meal coverage.  -Follow CBGs fluctuation for further adjustment on hypoglycemic regimen.  9-physical deconditioning -Physical therapy has been consulted and will follow recommendations. -Patient is medically stable and will be discharged to skilled nursing facility for rehabilitation pending a negative Covid test. -Patient had a fall on 07/18/2019 while out of bed; CT head was negative for acute fractures or hemorrhage.   DVT prophylaxis: Apixaban. Code Status: Full code Family Communication: Husband at bedside.  Disposition Plan: Patient is medically stable for discharge to skilled nursing facility at this time. Waiting on negative Covid test.    Consultants:   GI service  Procedures:   See below for x-ray reports.  Antimicrobials:  Anti-infectives (From  admission, onward)   Start     Dose/Rate Route Frequency Ordered Stop  08/19/19 1000  vancomycin (VANCOCIN) 50 mg/mL oral solution 125 mg     125 mg Oral Every 3 DAYS 07/13/19 1813 08/28/19 0959   08/11/19 1000  vancomycin (VANCOCIN) 50 mg/mL oral solution 125 mg     125 mg Oral Every other day 07/13/19 1813 08/19/19 0959   08/04/19 1000  vancomycin (VANCOCIN) 50 mg/mL oral solution 125 mg     125 mg Oral Daily 07/13/19 1813 08/11/19 0959   07/27/19 2200  vancomycin (VANCOCIN) 50 mg/mL oral solution 125 mg     125 mg Oral 2 times daily 07/13/19 1813 08/03/19 2159   07/13/19 1815  vancomycin (VANCOCIN) 50 mg/mL oral solution 125 mg  Status:  Discontinued     125 mg Oral Every 6 hours 07/13/19 1813 07/13/19 1818   07/13/19 1815  vancomycin (VANCOCIN) 50 mg/mL oral solution 125 mg     125 mg Oral 4 times daily 07/13/19 1813 07/27/19 1759       Subjective: No fever, no chest pain, no abdominal pain, no nausea, no vomiting, and no diarrhea. Patient reports that she had no trouble with swallowing since the diet was changed to dysphagia 2. Continues to have stable CBG's.   Objective: Vitals:   07/18/19 2252 07/19/19 0517 07/19/19 0729 07/19/19 0833  BP:  112/88  129/89  Pulse: (!) 106 (!) 101  (!) 103  Resp: 18 16    Temp:  98.9 F (37.2 C)    TempSrc:  Oral    SpO2: 93% 97% 93%   Weight:      Height:        Intake/Output Summary (Last 24 hours) at 07/19/2019 0926 Last data filed at 07/18/2019 2200 Gross per 24 hour  Intake 1440 ml  Output -  Net 1440 ml   Filed Weights   07/13/19 2030 07/14/19 0500 07/15/19 0700  Weight: 81.7 kg 81.7 kg 81.4 kg    Examination: General exam: Alert, awake, oriented x 3. Afebrile. Patient reports feeling good today. Reports she had no trouble tolerating diet since it was changed to dysphagia 2. Denies abdominal pain, nausea, vomiting, and diarrhea.  Respiratory system: Clear to auscultation. Respiratory effort normal. Cardiovascular  system:Irregular but rate controlled. . No murmurs, rubs, gallops. Gastrointestinal system: Abdomen is nondistended, soft and nontender. No organomegaly or masses felt. Normal bowel sounds heard. Central nervous system: Alert and oriented. No focal neurological deficits. Extremities: No cyanosis or clubbing. +pedal pulses Skin: No rashes, lesions or ulcers Psychiatry: Judgement and insight appear normal. Mood & affect appropriate.    Data Reviewed: I have personally reviewed following labs and imaging studies  CBC: Recent Labs  Lab 07/13/19 1447 07/14/19 0537  WBC 12.0* 10.9*  NEUTROABS 7.3  --   HGB 12.8 11.3*  HCT 40.6 35.2*  MCV 93.1 92.9  PLT 138* 101   Basic Metabolic Panel: Recent Labs  Lab 07/13/19 1447 07/14/19 0537 07/15/19 0417  NA 134* 138 138  K 3.2* 3.5 3.8  CL 96* 105 103  CO2 24 23 28   GLUCOSE 369* 295* 122*  BUN 9 7* 13  CREATININE 0.79 0.65 0.80  CALCIUM 9.4 8.7* 9.5   GFR: Estimated Creatinine Clearance: 67.6 mL/min (by C-G formula based on SCr of 0.8 mg/dL).   Liver Function Tests: Recent Labs  Lab 07/13/19 1447  AST 8*  ALT 12  ALKPHOS 60  BILITOT 0.9  PROT 5.9*  ALBUMIN 3.2*   Coagulation Profile: Recent Labs  Lab 07/13/19 1447  INR 1.0  CBG: Recent Labs  Lab 07/18/19 0725 07/18/19 1129 07/18/19 1621 07/18/19 2038 07/19/19 0741  GLUCAP 79 164* 111* 265* 115*   Urine analysis:    Component Value Date/Time   COLORURINE STRAW (A) 07/13/2019 1427   APPEARANCEUR CLEAR 07/13/2019 1427   LABSPEC 1.029 07/13/2019 1427   PHURINE 6.0 07/13/2019 1427   GLUCOSEU >=500 (A) 07/13/2019 1427   HGBUR NEGATIVE 07/13/2019 1427   BILIRUBINUR NEGATIVE 07/13/2019 1427   KETONESUR 20 (A) 07/13/2019 1427   PROTEINUR NEGATIVE 07/13/2019 1427   NITRITE NEGATIVE 07/13/2019 1427   LEUKOCYTESUR TRACE (A) 07/13/2019 1427    Recent Results (from the past 240 hour(s))  SARS CORONAVIRUS 2 (TAT 6-24 HRS) Nasopharyngeal Nasopharyngeal Swab      Status: None   Collection Time: 07/13/19  5:08 PM   Specimen: Nasopharyngeal Swab  Result Value Ref Range Status   SARS Coronavirus 2 NEGATIVE NEGATIVE Final    Comment: (NOTE) SARS-CoV-2 target nucleic acids are NOT DETECTED. The SARS-CoV-2 RNA is generally detectable in upper and lower respiratory specimens during the acute phase of infection. Negative results do not preclude SARS-CoV-2 infection, do not rule out co-infections with other pathogens, and should not be used as the sole basis for treatment or other patient management decisions. Negative results must be combined with clinical observations, patient history, and epidemiological information. The expected result is Negative. Fact Sheet for Patients: SugarRoll.be Fact Sheet for Healthcare Providers: https://www.woods-mathews.com/ This test is not yet approved or cleared by the [place] FDA and  has been authorized for detection and/or diagnosis of SARS-CoV-2 by FDA under an Emergency Use Authorization (EUA). This EUA will remain  in effect (meaning this test can be used) for the duration of the COVID-19 declaration under Section 56 4(b)(1) of the Act, 21 U.S.C. section 360bbb-3(b)(1), unless the authorization is terminated or revoked sooner. Performed at [facility], [place] [address] [place], [place] [address]   MRSA PCR Screening     Status: None   Collection Time: 07/13/19  7:58 PM   Specimen: Nasal Mucosa; Nasopharyngeal  Result Value Ref Range Status   MRSA by PCR NEGATIVE NEGATIVE Final    Comment:        The GeneXpert MRSA Assay (FDA approved for NASAL specimens only), is one component of a comprehensive MRSA colonization surveillance program. It is not intended to diagnose MRSA infection nor to guide or monitor treatment for MRSA infections. Performed at [facility], [address], [place], [place] [address]   C Difficile Quick Screen w PCR reflex      Status: Abnormal   Collection Time: 07/14/19 12:00 PM   Specimen: Stool  Result Value Ref Range Status   C Diff antigen POSITIVE (A) NEGATIVE Final   C Diff toxin NEGATIVE NEGATIVE Final   C Diff interpretation Results are indeterminate. See PCR results.  Final    Comment: Performed at [facility], [place], [address] [place], [place] [address]  C. Diff by PCR, Reflexed     Status: Abnormal   Collection Time: 07/14/19 12:00 PM  Result Value Ref Range Status   Toxigenic C. Difficile by PCR POSITIVE (A) NEGATIVE Final    Comment: Positive for toxigenic C. difficile with little to no toxin production. Only treat if clinical presentation suggests symptomatic illness. Performed at [facility], [place] [address], [place],  [address]      Radiology Studies: Ct Head Wo Contrast  Result Date: 07/18/2019 CLINICAL DATA:  Head trauma, headache. Inpatient  fall going to restroom. EXAM: CT HEAD WITHOUT CONTRAST TECHNIQUE: Contiguous axial images were obtained from the base of the skull through the vertex without intravenous contrast. COMPARISON:  None. FINDINGS: Brain: No intracranial hemorrhage, mass effect, or midline shift. Age related atrophy. Mild chronic small vessel ischemia. Cavum septum pellucidum, normal variant. No hydrocephalus. The basilar cisterns are patent. No evidence of territorial infarct or acute ischemia. No extra-axial or intracranial fluid collection. Vascular: No hyperdense vessel. Skull: No fracture or focal lesion. Sinuses/Orbits: Paranasal sinuses and mastoid air cells are clear. The visualized orbits are unremarkable. Other: None. IMPRESSION: No acute intracranial abnormality. No skull fracture. Electronically Signed   By: Keith Rake M.D.   On: 07/18/2019 06:38    Scheduled Meds: . acidophilus  1 capsule Oral Daily  . apixaban  5 mg Oral BID  . azelastine  2 spray Each Nare BID  . balsalazide  2,250 mg Oral TID  . calcium-vitamin D  1 tablet Oral BID  .  dicyclomine  10 mg Oral BID AC  . famotidine  20 mg Oral BID  . insulin aspart  0-15 Units Subcutaneous TID WC  . insulin aspart  0-5 Units Subcutaneous QHS  . insulin aspart  5 Units Subcutaneous TID WC  . insulin glargine  15 Units Subcutaneous BID  . ketotifen  1 drop Both Eyes Daily  . metoprolol tartrate  75 mg Oral BID  . mometasone-formoterol  2 puff Inhalation BID  . pravastatin  20 mg Oral Daily  . predniSONE  40 mg Oral Q breakfast  . pregabalin  100 mg Oral BID  . traZODone  100 mg Oral QHS  . vancomycin  125 mg Oral QID   Followed by  . [START ON 07/27/2019] vancomycin  125 mg Oral BID   Followed by  . [START ON 08/04/2019] vancomycin  125 mg Oral Daily   Followed by  . [START ON 08/11/2019] vancomycin  125 mg Oral QODAY   Followed by  . [START ON 08/19/2019] vancomycin  125 mg Oral Q3 days   Continuous Infusions:    LOS: 5 days    Time spent: 30 minutes.   Barton Dubois, MD [facility] Pager [phone]   07/19/2019, 9:26 AM

## 2019-07-19 NOTE — Progress Notes (Signed)
Patient MEWS score increased from 1 to 2. Pulse increased to 115. New set of vitals taken and increase noted again. Continued to work with patients anxiety and all numbers returned to normal within 15 minutes. MD made aware.  07/19/2019 1650

## 2019-07-20 LAB — GLUCOSE, CAPILLARY
Glucose-Capillary: 55 mg/dL — ABNORMAL LOW (ref 70–99)
Glucose-Capillary: 197 mg/dL — ABNORMAL HIGH (ref 70–99)
Glucose-Capillary: 265 mg/dL — ABNORMAL HIGH (ref 70–99)
Glucose-Capillary: 224 mg/dL — ABNORMAL HIGH (ref 70–99)
Glucose-Capillary: 377 mg/dL — ABNORMAL HIGH (ref 70–99)

## 2019-07-20 LAB — SARS CORONAVIRUS 2 (TAT 6-24 HRS): SARS Coronavirus 2: NEGATIVE

## 2019-07-20 MED ORDER — INSULIN GLARGINE 100 UNIT/ML ~~LOC~~ SOLN
13.0000 [IU] | Freq: Two times a day (BID) | SUBCUTANEOUS | Status: DC
  Administered 2019-07-20 – 2019-07-21 (×2): 13 [IU] via SUBCUTANEOUS
  Filled 2019-07-20 (×4): qty 0.13

## 2019-07-20 MED ORDER — INSULIN ASPART 100 UNIT/ML ~~LOC~~ SOLN
6.0000 [IU] | Freq: Three times a day (TID) | SUBCUTANEOUS | Status: DC
  Administered 2019-07-20 – 2019-07-21 (×3): 6 [IU] via SUBCUTANEOUS

## 2019-07-20 NOTE — Evaluation (Signed)
Physical Therapy Evaluation Patient Details Name: [name] MRN: [id number] DOB: 08/29/1951 Today's Date: 07/20/2019   History of Present Illness  [name]  is a 68 y.o. female, with past medical history for A. fib, on Eliquis, chronic back pain, COPD, on room air, DDD, ulcerative colitis, C. difficile colitis, with recent prolonged hospitalization secondary to colitis related to ulcerative colitis and C. difficile colitis, she was discharged on prednisone taper, and vancomycin taper, patient/her husband reports she has been compliant with her regimen, patient reports for last 3 days, she has been having worsening diarrhea, 6-10 times a day, so far she had 6 episodes for today, as well she is having persistent nausea, but no vomiting, no coffee-ground emesis, as well she has been having poor appetite, abdominal pain, crampy, nonradiating, reports some minimal amount of blood in her stool, but report is always been like that, no NSAIDs use, no alcohol use, she is on Eliquis for A. fib, no fever, no chills, no cough, no shortness of breath, no COVID-19 exposure.    Clinical Impression  Patient limited for functional mobility as stated below secondary to BLE weakness, fatigue and poor standing balance.  Patient demonstrates good return for bed mobility, has to lean on nearby objects when not using AD, limited to ambulation to bathroom, had much difficult completing sit to stands from commode due to weakness and tolerated sitting up in chair with spouse present in room after therapy.  Patient will benefit from continued physical therapy in hospital and recommended venue below to increase strength, balance, endurance for safe ADLs and gait.     Follow Up Recommendations SNF;Supervision for mobility/OOB;Supervision/Assistance - 24 hour    Equipment Recommendations  None recommended by PT    Recommendations for Other Services       Precautions / Restrictions Precautions Precautions:  Fall Restrictions Weight Bearing Restrictions: No      Mobility  Bed Mobility Overal bed mobility: Needs Assistance Bed Mobility: Supine to Sit;Sit to Supine     Supine to sit: Supervision Sit to supine: Supervision      Transfers Overall transfer level: Needs assistance Equipment used: Rolling walker (2 wheeled) Transfers: Sit to/from [facility] Sit to Stand: Min assist Stand pivot transfers: Min assist          Ambulation/Gait Ambulation/Gait assistance: Min assist Gait Distance (Feet): 12 Feet Assistive device: Rolling walker (2 wheeled) Gait Pattern/deviations: Decreased step length - right;Decreased step length - left;Decreased stride length Gait velocity: decreased   General Gait Details: slow labored cadence without loss of balance, limited secondary to fatigue  Stairs            Wheelchair Mobility    Modified Rankin (Stroke Patients Only)       Balance Overall balance assessment: Needs assistance Sitting-balance support: Feet supported;No upper extremity supported Sitting balance-Leahy Scale: Fair Sitting balance - Comments: fair/good seated at bedside   Standing balance support: During functional activity;No upper extremity supported Standing balance-Leahy Scale: Poor Standing balance comment: fair/poor withou AD, fair using RW                             Pertinent Vitals/Pain Pain Assessment: No/denies pain    Home Living Family/patient expects to be discharged to:: Private residence Living Arrangements: Spouse/significant other Available Help at Discharge: Family;Available 24 hours/day Type of Home: Mobile home Home Access: Ramped entrance     Home Layout: One level Home Equipment: Gilford Rile -  2 wheels;Cane - single point      Prior Function Level of Independence: Needs assistance   Gait / Transfers Assistance Needed: very short distanced household ambulator without AD, uses wheelchair for longer  distances  ADL's / Homemaking Assistance Needed: assisted by spouse        Hand Dominance   Dominant Hand: Right    Extremity/Trunk Assessment   Upper Extremity Assessment Upper Extremity Assessment: Generalized weakness    Lower Extremity Assessment Lower Extremity Assessment: Generalized weakness    Cervical / Trunk Assessment Cervical / Trunk Assessment: Normal  Communication   Communication: No difficulties  Cognition Arousal/Alertness: Awake/alert Behavior During Therapy: WFL for tasks assessed/performed Overall Cognitive Status: Within Functional Limits for tasks assessed                                        General Comments      Exercises     Assessment/Plan    PT Assessment Patient needs continued PT services  PT Problem List Decreased strength;Decreased activity tolerance;Decreased balance;Decreased mobility       PT Treatment Interventions Gait training;Functional mobility training;Therapeutic activities;Balance training;Therapeutic exercise;Patient/family education    PT Goals (Current goals can be found in the Care Plan section)  Acute Rehab PT Goals Patient Stated Goal: return home after rehab PT Goal Formulation: With patient/family Time For Goal Achievement: 08/03/19 Potential to Achieve Goals: Good    Frequency Min 3X/week   Barriers to discharge        Co-evaluation               AM-PAC PT "6 Clicks" Mobility  Outcome Measure Help needed turning from your back to your side while in a flat bed without using bedrails?: None Help needed moving from lying on your back to sitting on the side of a flat bed without using bedrails?: None Help needed moving to and from a bed to a chair (including a wheelchair)?: A Little Help needed standing up from a chair using your arms (e.g., wheelchair or bedside chair)?: A Little Help needed to walk in hospital room?: A Lot Help needed climbing 3-5 steps with a railing? : A  Lot 6 Click Score: 18    End of Session   Activity Tolerance: Patient tolerated treatment well;Patient limited by fatigue Patient left: in chair;with call bell/phone within reach;with chair alarm set;with family/visitor present Nurse Communication: Mobility status PT Visit Diagnosis: Unsteadiness on feet (R26.81);Other abnormalities of gait and mobility (R26.89);Muscle weakness (generalized) (M62.81)    Time: 0354-6568 PT Time Calculation (min) (ACUTE ONLY): 23 min   Charges:   PT Evaluation $PT Eval Moderate Complexity: 1 Mod PT Treatments $Therapeutic Activity: 23-37 mins        3:46 PM, 07/20/19 Lonell Grandchild, MPT Physical Therapist with [facility] [phone] office [phone] mobile phone

## 2019-07-20 NOTE — Plan of Care (Signed)
  Problem: Acute Rehab PT Goals(only PT should resolve) Goal: Pt Will Go Supine/Side To Sit Outcome: Progressing Flowsheets (Taken 07/20/2019 1548) Pt will go Supine/Side to Sit: with modified independence Goal: Patient Will Transfer Sit To/From Stand Outcome: Progressing Flowsheets (Taken 07/20/2019 1548) Patient will transfer sit to/from stand: with min guard assist Goal: Pt Will Transfer Bed To Chair/Chair To Bed Outcome: Progressing Flowsheets (Taken 07/20/2019 1548) Pt will Transfer Bed to Chair/Chair to Bed: min guard assist Goal: Pt Will Ambulate 07/20/2019 1549 by Lonell Grandchild, PT Flowsheets (Taken 07/20/2019 1549) Pt will Ambulate:  25 feet  with min guard assist  with rolling walker 07/20/2019 1548 by Lonell Grandchild, PT Outcome: Progressing Flowsheets (Taken 07/20/2019 1548) Pt will Ambulate:  25 feet  with minimal assist  with rolling walker   3:49 PM, 07/20/19 Lonell Grandchild, MPT Physical Therapist with [facility] [phone] office [phone] mobile phone

## 2019-07-20 NOTE — Progress Notes (Addendum)
Inpatient Diabetes Program Recommendations  AACE/ADA: New Consensus Statement on Inpatient Glycemic Control (2015)  Target Ranges:  Prepandial:   less than 140 mg/dL      Peak postprandial:   less than 180 mg/dL (1-2 hours)      Critically ill patients:  140 - 180 mg/dL   Lab Results  Component Value Date   GLUCAP 55 (L) 07/20/2019   HGBA1C 9.9 (H) 07/14/2019    Review of Glycemic Control Results for [name], [name] (MRN [id number]) as of 07/20/2019 09:06  Ref. Range 07/19/2019 07:41 07/19/2019 11:55 07/19/2019 17:20 07/19/2019 20:37 07/20/2019 07:29  Glucose-Capillary Latest Ref Range: 70 - 99 mg/dL 115 (H) 192 (H) 180 (H) 150 (H) 55 (L)   Diabetes history: DM 2 Not on medication at home Glucose test strips ordered for home Pt on steroids outpatient Current orders for Inpatient glycemic control:  Lantus 15 units bid Novolog 0-15 units tid + hs Novolog 5 units tid meal coverage  A1c 9.9%  Inpatient Diabetes Program Recommendations:    Hypoglycemia this am 55 mg/dl. Consider decreasing Lantus to 12 units bid.  PT possibly d/cing to SNF.  Thanks,  Tama Headings RN, MSN, BC-ADM Inpatient Diabetes Coordinator Team Pager [phone] (8a-5p)

## 2019-07-20 NOTE — Care Management Important Message (Signed)
Important Message  Patient Details  Name: [name] MRN: [id number] Date of Birth: 03-Jul-1951   Medicare Important Message Given:  Yes  Given to RN to deliver to patient due to contact precautions   Tommy Medal 07/20/2019, 3:17 PM

## 2019-07-20 NOTE — Progress Notes (Signed)
PROGRESS NOTE    [name]  OIZ:[id number] DOB: 1950-10-22 DOA: 07/13/2019 PCP: Jalene Mullet, PA-C     Brief Narrative:  68 y.o. female, with past medical history for A. fib, on Eliquis, chronic back pain, COPD, on room air, DDD, ulcerative colitis, C. difficile colitis, with recent prolonged hospitalization secondary to colitis related to ulcerative colitis and C. difficile colitis, she was discharged on prednisone taper, and vancomycin taper, patient/her husband reports she has been compliant with her regimen, patient reports for last 3 days, she has been having worsening diarrhea, 6-10 times a day, so far she had 6 episodes for today, as well she is having persistent nausea, but no vomiting, no coffee-ground emesis, as well she has been having poor appetite, abdominal pain, crampy, nonradiating, reports some minimal amount of blood in her stool, but report is always been like that, no NSAIDs use, no alcohol use, she is on Eliquis for A. fib, no fever, no chills, no cough, no shortness of breath, no COVID-19 exposure. -in ED patient noted to be in A. fib with RVR requiring Cardizem drip, potassium 3.2, CT abdomen and pelvis significant for left side: Colitis, TRH called for admission.   Assessment & Plan: 1-abdominal pain/diarrhea secondary to colitis -No further watery stools, -With concern for ulcerative colitis flare versus C. difficile reinfection -Continue p.o. steroids and oral vancomycin taper (125 mg 4 times daily from 07/13/2019 through 07/27/2019, then 125 mg twice daily from 07/27/2019 through 08/04/2019, then 125 mg daily starting 08/04/2019 through 08/11/2019, 125 mg every other day starting 08/11/2019 through 08/19/2019, then 125 mg 25 mg every 3 days for 1 week starting 08/19/2019) -Follow GI service recommendation -C. difficile by PCR positive, toxin results negative. -Continue supportive care, gentle fluid resuscitation and electrolyte repletion. -Patient reporting no  abdominal pain currently and her stools are firming up. -PPI discontinued; will continue H2 blocker.. -Patient currently tolerating diet well. -Continue advancing diet as tolerated, tolerating dysphagia 2 diet with thin liquids well -Hopefully discharged in the next 24hours;  PT recommends SNF; waiting on COVID test results.  2-Paroxysmal atrial fibrillation with RVR on presentation(HCC) -Heart rate improved -Off Cardizem drip  -Blood pressure stable.  -Patient remains in atrial fibrillation -Continue  beta-blocker and continue Eliquis for anticoagulation. -Continue correcting potassium and magnesium level as needed -Continue telemetry monitoring   3-hypokalemia -In the setting of GI losses -Continue repletion as needed.  -Magnesium within normal limits currently. -Follow electrolytes trend.  4-COPD -Continue bronchodilators as needed -Patient with no wheezing at this time -Good O2 sat on room air appreciated.  5-anxiety/depression -Mood is stable -No suicidal ideation or hallucination -Continue home medication regiment.  6-GERD -Continue famotidine.  -PPI discontinued in the setting of concerns for C. difficile infection.  7-hyperlipidemia -Continue statins.  8-type 2 diabetes with hyperglycemia -A1c 9.9 -CBGs elevated most likely in the setting of steroids usage, and also early requirement of Cardizem drip (which is  dextrose-based). -Continue moderate sliding scale insulin, Lantus twice a day and NovoLog for meal coverage.  -Follow CBGs fluctuation for further adjustment on hypoglycemic regimen. -Blood sugars have been up and down, decrease Lantus insulin to 13 units twice daily, increase mealtime insulin to 6 units 3 times daily  9-physical deconditioning -Physical therapy has been consulted and will follow recommendations. -Patient is medically stable and will be discharged to skilled nursing facility for rehabilitation pending a negative Covid test. -Patient had  a fall on 07/18/2019 while out of bed; CT head was negative for  acute fractures or hemorrhage.   DVT prophylaxis: Apixaban. Code Status: Full code Family Communication: Husband at bedside.  Disposition Plan: Patient is medically stable for discharge to skilled nursing facility at this time. Waiting on negative Covid test.    Consultants:   GI service  Procedures:   See below for x-ray reports.  Antimicrobials:  Anti-infectives (From admission, onward)   Start     Dose/Rate Route Frequency Ordered Stop   08/19/19 1000  vancomycin (VANCOCIN) 50 mg/mL oral solution 125 mg     125 mg Oral Every 3 DAYS 07/13/19 1813 08/28/19 0959   08/11/19 1000  vancomycin (VANCOCIN) 50 mg/mL oral solution 125 mg     125 mg Oral Every other day 07/13/19 1813 08/19/19 0959   08/04/19 1000  vancomycin (VANCOCIN) 50 mg/mL oral solution 125 mg     125 mg Oral Daily 07/13/19 1813 08/11/19 0959   07/27/19 2200  vancomycin (VANCOCIN) 50 mg/mL oral solution 125 mg     125 mg Oral 2 times daily 07/13/19 1813 08/03/19 2159   07/13/19 1815  vancomycin (VANCOCIN) 50 mg/mL oral solution 125 mg  Status:  Discontinued     125 mg Oral Every 6 hours 07/13/19 1813 07/13/19 1818   07/13/19 1815  vancomycin (VANCOCIN) 50 mg/mL oral solution 125 mg     125 mg Oral 4 times daily 07/13/19 1813 07/27/19 1759       Subjective: -Tolerating current diet okay -No further watery stools, husband at bedside, questions answered -Complains of unsteady gait and generalized weakness.   Objective: Vitals:   07/20/19 0640 07/20/19 0734 07/20/19 1437 07/20/19 1530  BP: (!) 114/96  (!) 83/56 113/84  Pulse: 99  66   Resp: 18  20   Temp: 98.4 F (36.9 C)  98.3 F (36.8 C)   TempSrc: Oral  Oral   SpO2: 99% 96% 99%   Weight:      Height:        Intake/Output Summary (Last 24 hours) at 07/20/2019 1706 Last data filed at 07/20/2019 1500 Gross per 24 hour  Intake 480 ml  Output --  Net 480 ml   Filed Weights   07/13/19  2030 07/14/19 0500 07/15/19 0700  Weight: 81.7 kg 81.7 kg 81.4 kg    Examination: General exam: Alert, awake, oriented x 3. Afebrile. Patient reports feeling good today. Reports she had no trouble tolerating diet since it was changed to dysphagia 2. Denies abdominal pain, nausea, vomiting, and diarrhea.  Respiratory system: Clear to auscultation. Respiratory effort normal. Cardiovascular system:Irregular but rate controlled. . No murmurs, rubs, gallops. Gastrointestinal system: Abdomen is nondistended, soft and nontender. No organomegaly or masses felt. Normal bowel sounds heard. Central nervous system: Alert and oriented. No focal neurological deficits. Extremities: No cyanosis or clubbing. +pedal pulses Skin: No rashes, lesions or ulcers Psychiatry: Judgement and insight appear normal. Mood & affect appropriate.    Data Reviewed:  CBC: Recent Labs  Lab 07/14/19 0537  WBC 10.9*  HGB 11.3*  HCT 35.2*  MCV 92.9  PLT 505   Basic Metabolic Panel: Recent Labs  Lab 07/14/19 0537 07/15/19 0417  NA 138 138  K 3.5 3.8  CL 105 103  CO2 23 28  GLUCOSE 295* 122*  BUN 7* 13  CREATININE 0.65 0.80  CALCIUM 8.7* 9.5   GFR: Estimated Creatinine Clearance: 67.6 mL/min (by C-G formula based on SCr of 0.8 mg/dL).   Liver Function Tests: No results for input(s): AST, ALT, ALKPHOS, BILITOT, PROT,  ALBUMIN in the last 168 hours. Coagulation Profile: No results for input(s): INR, PROTIME in the last 168 hours. CBG: Recent Labs  Lab 07/19/19 2037 07/20/19 0729 07/20/19 0955 07/20/19 1113 07/20/19 1604  GLUCAP 150* 55* 197* 265* 224*   Urine analysis:    Component Value Date/Time   COLORURINE STRAW (A) 07/13/2019 1427   APPEARANCEUR CLEAR 07/13/2019 1427   LABSPEC 1.029 07/13/2019 1427   PHURINE 6.0 07/13/2019 1427   GLUCOSEU >=500 (A) 07/13/2019 1427   HGBUR NEGATIVE 07/13/2019 1427   BILIRUBINUR NEGATIVE 07/13/2019 1427   KETONESUR 20 (A) 07/13/2019 1427   PROTEINUR  NEGATIVE 07/13/2019 1427   NITRITE NEGATIVE 07/13/2019 1427   LEUKOCYTESUR TRACE (A) 07/13/2019 1427    Recent Results (from the past 240 hour(s))  SARS CORONAVIRUS 2 (TAT 6-24 HRS) Nasopharyngeal Nasopharyngeal Swab     Status: None   Collection Time: 07/13/19  5:08 PM   Specimen: Nasopharyngeal Swab  Result Value Ref Range Status   SARS Coronavirus 2 NEGATIVE NEGATIVE Final    Comment: (NOTE) SARS-CoV-2 target nucleic acids are NOT DETECTED. The SARS-CoV-2 RNA is generally detectable in upper and lower respiratory specimens during the acute phase of infection. Negative results do not preclude SARS-CoV-2 infection, do not rule out co-infections with other pathogens, and should not be used as the sole basis for treatment or other patient management decisions. Negative results must be combined with clinical observations, patient history, and epidemiological information. The expected result is Negative. Fact Sheet for Patients: SugarRoll.be Fact Sheet for Healthcare Providers: https://www.woods-mathews.com/ This test is not yet approved or cleared by the [place] FDA and  has been authorized for detection and/or diagnosis of SARS-CoV-2 by FDA under an Emergency Use Authorization (EUA). This EUA will remain  in effect (meaning this test can be used) for the duration of the COVID-19 declaration under Section 56 4(b)(1) of the Act, 21 U.S.C. section 360bbb-3(b)(1), unless the authorization is terminated or revoked sooner. Performed at [facility], [place] [address] [place], [place] [address]   MRSA PCR Screening     Status: None   Collection Time: 07/13/19  7:58 PM   Specimen: Nasal Mucosa; Nasopharyngeal  Result Value Ref Range Status   MRSA by PCR NEGATIVE NEGATIVE Final    Comment:        The GeneXpert MRSA Assay (FDA approved for NASAL specimens only), is one component of a comprehensive MRSA colonization surveillance  program. It is not intended to diagnose MRSA infection nor to guide or monitor treatment for MRSA infections. Performed at [facility], [address], [place], [place] [address]   GI pathogen panel by PCR, stool     Status: None   Collection Time: 07/14/19 12:00 PM   Specimen: Stool  Result Value Ref Range Status   Plesiomonas shigelloides NOT PERFORMED  Final    Comment: Test not performed   Yersinia enterocolitica NOT PERFORMED  Final    Comment: Test not performed   Vibrio NOT PERFORMED  Final    Comment: Test not performed   Enteropathogenic E coli NOT PERFORMED  Final    Comment: Test not performed   E coli (ETEC) LT/ST NOT PERFORMED  Final    Comment: Test not performed   E coli 0157 by PCR NOT PERFORMED  Final    Comment: Test not performed   Cryptosporidium by PCR NOT PERFORMED  Final    Comment: Test not performed   Entamoeba histolytica NOT PERFORMED  Final  Comment: Test not performed   Adenovirus F 40/41 NOT PERFORMED  Final    Comment: Test not performed   Norovirus GI/GII NOT PERFORMED  Final    Comment: Test not performed   Sapovirus NOT PERFORMED  Final    Comment: (NOTE) Test not performed Performed At: [facility] [place], [place] [id number] Rush Farmer MD KZ:[id number]    Vibrio cholerae NOT PERFORMED  Final    Comment: Test not performed   Campylobacter by PCR CANRGT  Final    Comment: (NOTE) Test not performed. Unable to perform test due to current unavailability of reagents or discontinuation of test.      Vinnie Level T notified 07/19/2019    Salmonella by PCR NOT PERFORMED  Final    Comment: Test not performed   E coli (STEC) NOT PERFORMED  Final    Comment: Test not performed   Enteroaggregative E coli NOT PERFORMED  Final    Comment: Test not performed   Shigella by PCR NOT PERFORMED  Final    Comment: Test not performed   Cyclospora cayetanensis NOT PERFORMED  Final    Comment: Test not performed   Astrovirus  NOT PERFORMED  Final    Comment: Test not performed   G lamblia by PCR NOT PERFORMED  Final    Comment: Test not performed   Rotavirus A by PCR NOT PERFORMED  Final    Comment: Test not performed  C Difficile Quick Screen w PCR reflex     Status: Abnormal   Collection Time: 07/14/19 12:00 PM   Specimen: Stool  Result Value Ref Range Status   C Diff antigen POSITIVE (A) NEGATIVE Final   C Diff toxin NEGATIVE NEGATIVE Final   C Diff interpretation Results are indeterminate. See PCR results.  Final    Comment: Performed at [facility], [address], [place], [place] [address]  C. Diff by PCR, Reflexed     Status: Abnormal   Collection Time: 07/14/19 12:00 PM  Result Value Ref Range Status   Toxigenic C. Difficile by PCR POSITIVE (A) NEGATIVE Final    Comment: Positive for toxigenic C. difficile with little to no toxin production. Only treat if clinical presentation suggests symptomatic illness. Performed at [facility], [place] [address], [place], [place] [address]   SARS CORONAVIRUS 2 (TAT 6-24 HRS) Nasopharyngeal Nasopharyngeal Swab     Status: None   Collection Time: 07/19/19 11:37 AM   Specimen: Nasopharyngeal Swab  Result Value Ref Range Status   SARS Coronavirus 2 NEGATIVE NEGATIVE Final    Comment: (NOTE) SARS-CoV-2 target nucleic acids are NOT DETECTED. The SARS-CoV-2 RNA is generally detectable in upper and lower respiratory specimens during the acute phase of infection. Negative results do not preclude SARS-CoV-2 infection, do not rule out co-infections with other pathogens, and should not be used as the sole basis for treatment or other patient management decisions. Negative results must be combined with clinical observations, patient history, and epidemiological information. The expected result is Negative. Fact Sheet for Patients: SugarRoll.be Fact Sheet for Healthcare Providers: https://www.woods-mathews.com/ This test  is not yet approved or cleared by the [place] FDA and  has been authorized for detection and/or diagnosis of SARS-CoV-2 by FDA under an Emergency Use Authorization (EUA). This EUA will remain  in effect (meaning this test can be used) for the duration of the COVID-19 declaration under Section 56 4(b)(1) of the Act, 21 U.S.C. section 360bbb-3(b)(1), unless the authorization is terminated or  revoked sooner. Performed at [facility], [place] [address] [place], [place] [address]      Radiology Studies: No results found.  Scheduled Meds:  acidophilus  1 capsule Oral Daily   apixaban  5 mg Oral BID   azelastine  2 spray Each Nare BID   balsalazide  2,250 mg Oral TID   calcium-vitamin D  1 tablet Oral BID   dicyclomine  10 mg Oral BID AC   famotidine  20 mg Oral BID   insulin aspart  0-15 Units Subcutaneous TID WC   insulin aspart  0-5 Units Subcutaneous QHS   insulin aspart  6 Units Subcutaneous TID WC   insulin glargine  13 Units Subcutaneous BID   ketotifen  1 drop Both Eyes Daily   LORazepam  1 mg Oral Once   metoprolol tartrate  75 mg Oral BID   mometasone-formoterol  2 puff Inhalation BID   pravastatin  20 mg Oral Daily   predniSONE  40 mg Oral Q breakfast   pregabalin  100 mg Oral BID   traZODone  100 mg Oral QHS   vancomycin  125 mg Oral QID   Followed by   Derrill Memo ON 07/27/2019] vancomycin  125 mg Oral BID   Followed by   Derrill Memo ON 08/04/2019] vancomycin  125 mg Oral Daily   Followed by   Derrill Memo ON 08/11/2019] vancomycin  125 mg Oral QODAY   Followed by   Derrill Memo ON 08/19/2019] vancomycin  125 mg Oral Q3 days   Continuous Infusions:    LOS: 6 days    Roxan Hockey, MD [facility]  07/20/2019, 5:06 PM

## 2019-07-20 NOTE — Progress Notes (Signed)
Reports one loose stool this morning and none since.

## 2019-07-20 NOTE — TOC Progression Note (Signed)
Transition of Care [facility]) - Progression Note    Patient Details  Name: [name] MRN: [id number] Date of Birth: [birth year]  [facility] CM/SW Contact  Shade Flood, LCSW Phone Number: 07/20/2019, 2:49 PM  Clinical Narrative:     Per MD, he wants to order some labs for pt and he is anticipating she will be stable for dc tomorrow. Discussed with pt and her husband. Plan remains for transfer to [facility] for short term rehab.   Pt's husband states he will be here around 10AM tomorrow. Pt will need negative COVID result before she can go. It was ordered and drawn yesterday.  Covering TOC will follow up tomorrow.    Expected Discharge Plan: Skilled Nursing Facility Barriers to Discharge: Continued Medical Work up  Expected Discharge Plan and Services Expected Discharge Plan: [place] Acute Care Choice: Durable Medical Equipment Living arrangements for the past 2 months: Single Family Home                                       Social Determinants of Health (SDOH) Interventions    Readmission Risk Interventions Readmission Risk Prevention Plan 07/19/2019 06/15/2019 06/15/2019  Post Dischage Appt - - -  Transportation Screening - - -  PCP or Specialist Appt within 5-7 Days Not Complete - -  Not Complete comments Pt going to SNF - -  PCP or Specialist Appt within 3-5 Days - Not Complete -  Not Complete comments - Left message for hospital discharge follow up coordinator at PCP office requesting return contact to schedule appointment. -  Home Care Screening Not Complete - -  Home Care Screening Not Completed Comments Pt going to SNF - -  Medication Review (RN CM) Complete - -  HRI or Home Care Consult - - Complete  HRI or Home Care Consult comments - - -  Social Work Consult for [place] - - Not Applicable  Medication Review (RN Care Manager) - - -  Some  recent data might be hidden

## 2019-07-21 DIAGNOSIS — N390 Urinary tract infection, site not specified: Secondary | ICD-10-CM | POA: Diagnosis present

## 2019-07-21 DIAGNOSIS — Z7901 Long term (current) use of anticoagulants: Secondary | ICD-10-CM | POA: Diagnosis not present

## 2019-07-21 DIAGNOSIS — K529 Noninfective gastroenteritis and colitis, unspecified: Secondary | ICD-10-CM | POA: Diagnosis not present

## 2019-07-21 DIAGNOSIS — F330 Major depressive disorder, recurrent, mild: Secondary | ICD-10-CM | POA: Diagnosis not present

## 2019-07-21 DIAGNOSIS — E1165 Type 2 diabetes mellitus with hyperglycemia: Secondary | ICD-10-CM | POA: Diagnosis not present

## 2019-07-21 DIAGNOSIS — Z86711 Personal history of pulmonary embolism: Secondary | ICD-10-CM | POA: Diagnosis not present

## 2019-07-21 DIAGNOSIS — I4891 Unspecified atrial fibrillation: Secondary | ICD-10-CM | POA: Diagnosis present

## 2019-07-21 DIAGNOSIS — A0471 Enterocolitis due to Clostridium difficile, recurrent: Secondary | ICD-10-CM | POA: Diagnosis not present

## 2019-07-21 DIAGNOSIS — K51918 Ulcerative colitis, unspecified with other complication: Secondary | ICD-10-CM | POA: Diagnosis not present

## 2019-07-21 DIAGNOSIS — R262 Difficulty in walking, not elsewhere classified: Secondary | ICD-10-CM | POA: Diagnosis not present

## 2019-07-21 DIAGNOSIS — R1312 Dysphagia, oropharyngeal phase: Secondary | ICD-10-CM | POA: Diagnosis not present

## 2019-07-21 DIAGNOSIS — R110 Nausea: Secondary | ICD-10-CM | POA: Diagnosis not present

## 2019-07-21 DIAGNOSIS — B961 Klebsiella pneumoniae [K. pneumoniae] as the cause of diseases classified elsewhere: Secondary | ICD-10-CM | POA: Diagnosis present

## 2019-07-21 DIAGNOSIS — R197 Diarrhea, unspecified: Secondary | ICD-10-CM | POA: Diagnosis not present

## 2019-07-21 DIAGNOSIS — K5190 Ulcerative colitis, unspecified, without complications: Secondary | ICD-10-CM | POA: Diagnosis not present

## 2019-07-21 DIAGNOSIS — I4811 Longstanding persistent atrial fibrillation: Secondary | ICD-10-CM | POA: Diagnosis not present

## 2019-07-21 DIAGNOSIS — R410 Disorientation, unspecified: Secondary | ICD-10-CM | POA: Diagnosis not present

## 2019-07-21 DIAGNOSIS — I480 Paroxysmal atrial fibrillation: Secondary | ICD-10-CM | POA: Diagnosis not present

## 2019-07-21 DIAGNOSIS — E876 Hypokalemia: Secondary | ICD-10-CM | POA: Diagnosis present

## 2019-07-21 DIAGNOSIS — R4182 Altered mental status, unspecified: Secondary | ICD-10-CM | POA: Diagnosis not present

## 2019-07-21 DIAGNOSIS — R5381 Other malaise: Secondary | ICD-10-CM | POA: Diagnosis present

## 2019-07-21 DIAGNOSIS — R41841 Cognitive communication deficit: Secondary | ICD-10-CM | POA: Diagnosis not present

## 2019-07-21 DIAGNOSIS — G9341 Metabolic encephalopathy: Secondary | ICD-10-CM | POA: Diagnosis present

## 2019-07-21 DIAGNOSIS — J449 Chronic obstructive pulmonary disease, unspecified: Secondary | ICD-10-CM | POA: Diagnosis not present

## 2019-07-21 DIAGNOSIS — J189 Pneumonia, unspecified organism: Secondary | ICD-10-CM | POA: Diagnosis present

## 2019-07-21 DIAGNOSIS — R918 Other nonspecific abnormal finding of lung field: Secondary | ICD-10-CM | POA: Diagnosis not present

## 2019-07-21 DIAGNOSIS — Z743 Need for continuous supervision: Secondary | ICD-10-CM | POA: Diagnosis not present

## 2019-07-21 DIAGNOSIS — J439 Emphysema, unspecified: Secondary | ICD-10-CM | POA: Diagnosis not present

## 2019-07-21 DIAGNOSIS — E785 Hyperlipidemia, unspecified: Secondary | ICD-10-CM | POA: Diagnosis not present

## 2019-07-21 DIAGNOSIS — D509 Iron deficiency anemia, unspecified: Secondary | ICD-10-CM | POA: Diagnosis not present

## 2019-07-21 DIAGNOSIS — G4730 Sleep apnea, unspecified: Secondary | ICD-10-CM | POA: Diagnosis present

## 2019-07-21 DIAGNOSIS — F418 Other specified anxiety disorders: Secondary | ICD-10-CM | POA: Diagnosis present

## 2019-07-21 DIAGNOSIS — Z20828 Contact with and (suspected) exposure to other viral communicable diseases: Secondary | ICD-10-CM | POA: Diagnosis present

## 2019-07-21 DIAGNOSIS — M6281 Muscle weakness (generalized): Secondary | ICD-10-CM | POA: Diagnosis not present

## 2019-07-21 DIAGNOSIS — Z7982 Long term (current) use of aspirin: Secondary | ICD-10-CM | POA: Diagnosis not present

## 2019-07-21 DIAGNOSIS — Z880 Allergy status to penicillin: Secondary | ICD-10-CM | POA: Diagnosis not present

## 2019-07-21 DIAGNOSIS — I10 Essential (primary) hypertension: Secondary | ICD-10-CM | POA: Diagnosis present

## 2019-07-21 DIAGNOSIS — Z87891 Personal history of nicotine dependence: Secondary | ICD-10-CM | POA: Diagnosis not present

## 2019-07-21 DIAGNOSIS — E11649 Type 2 diabetes mellitus with hypoglycemia without coma: Secondary | ICD-10-CM | POA: Diagnosis present

## 2019-07-21 DIAGNOSIS — F419 Anxiety disorder, unspecified: Secondary | ICD-10-CM | POA: Diagnosis not present

## 2019-07-21 DIAGNOSIS — A419 Sepsis, unspecified organism: Secondary | ICD-10-CM | POA: Diagnosis present

## 2019-07-21 DIAGNOSIS — G4733 Obstructive sleep apnea (adult) (pediatric): Secondary | ICD-10-CM | POA: Diagnosis not present

## 2019-07-21 DIAGNOSIS — K219 Gastro-esophageal reflux disease without esophagitis: Secondary | ICD-10-CM | POA: Diagnosis not present

## 2019-07-21 DIAGNOSIS — A045 Campylobacter enteritis: Secondary | ICD-10-CM | POA: Diagnosis present

## 2019-07-21 DIAGNOSIS — Z8541 Personal history of malignant neoplasm of cervix uteri: Secondary | ICD-10-CM | POA: Diagnosis not present

## 2019-07-21 LAB — COMPREHENSIVE METABOLIC PANEL
ALT: 21 U/L (ref 0–44)
AST: 11 U/L — ABNORMAL LOW (ref 15–41)
Albumin: 2.6 g/dL — ABNORMAL LOW (ref 3.5–5.0)
Alkaline Phosphatase: 56 U/L (ref 38–126)
Anion gap: 10 (ref 5–15)
BUN: 13 mg/dL (ref 8–23)
CO2: 26 mmol/L (ref 22–32)
Calcium: 9.4 mg/dL (ref 8.9–10.3)
Chloride: 103 mmol/L (ref 98–111)
Creatinine, Ser: 0.76 mg/dL (ref 0.44–1.00)
GFR calc Af Amer: >60 mL/min (ref >60)
GFR calc non Af Amer: >60 mL/min (ref >60)
Glucose, Bld: 83 mg/dL (ref 70–99)
Potassium: 3.3 mmol/L — ABNORMAL LOW (ref 3.5–5.1)
Sodium: 139 mmol/L (ref 135–145)
Total Bilirubin: 0.4 mg/dL (ref 0.3–1.2)
Total Protein: 5.2 g/dL — ABNORMAL LOW (ref 6.5–8.1)

## 2019-07-21 LAB — GLUCOSE, CAPILLARY
Glucose-Capillary: 72 mg/dL (ref 70–99)
Glucose-Capillary: 235 mg/dL — ABNORMAL HIGH (ref 70–99)

## 2019-07-21 LAB — CBC
HCT: 36 % (ref 36.0–46.0)
Hemoglobin: 11.5 g/dL — ABNORMAL LOW (ref 12.0–15.0)
MCH: 29.9 pg (ref 26.0–34.0)
MCHC: 31.9 g/dL (ref 30.0–36.0)
MCV: 93.8 fL (ref 80.0–100.0)
Platelets: 221 10*3/uL (ref 150–400)
RBC: 3.84 MIL/uL — ABNORMAL LOW (ref 3.87–5.11)
RDW: 15.8 % — ABNORMAL HIGH (ref 11.5–15.5)
WBC: 10.2 10*3/uL (ref 4.0–10.5)
nRBC: 0 % (ref 0.0–0.2)

## 2019-07-21 MED ORDER — INSULIN ASPART 100 UNIT/ML ~~LOC~~ SOLN: SUBCUTANEOUS | 3 refills | Status: AC

## 2019-07-21 MED ORDER — FAMOTIDINE 20 MG PO TABS: 20.0000 mg | ORAL_TABLET | Freq: Two times a day (BID) | ORAL | 1 refills | Status: DC

## 2019-07-21 MED ORDER — INSULIN GLARGINE 100 UNIT/ML ~~LOC~~ SOLN: 14.0000 [IU] | Freq: Every day | SUBCUTANEOUS | 11 refills | Status: DC

## 2019-07-21 MED ORDER — PREDNISONE 10 MG PO TABS: 10.0000 mg | ORAL_TABLET | ORAL | 0 refills | Status: AC

## 2019-07-21 MED ORDER — VANCOMYCIN 50 MG/ML ORAL SOLUTION: ORAL | 0 refills | Status: AC

## 2019-07-21 MED ORDER — POTASSIUM CHLORIDE CRYS ER 20 MEQ PO TBCR
40.0000 meq | EXTENDED_RELEASE_TABLET | ORAL | Status: AC
  Administered 2019-07-21 (×2): 40 meq via ORAL
  Filled 2019-07-21 (×2): qty 2

## 2019-07-21 MED ORDER — METOPROLOL TARTRATE 25 MG PO TABS: 25.0000 mg | ORAL_TABLET | Freq: Two times a day (BID) | ORAL | 2 refills | Status: AC

## 2019-07-21 NOTE — Discharge Summary (Signed)
[name], is a 68 y.o. female  DOB 11-27-1950  MRN [id number].  Admission date:  07/13/2019  Admitting Physician  Albertine Patricia, MD  Discharge Date:  07/21/2019   Primary MD  Jalene Mullet, PA-C  Recommendations for primary care physician for things to follow:   - 1)Take oral Vancomycin taper as follows ---oral vancomycin taper (125 mg 4 times daily from 07/13/2019 through 07/27/2019, then 125 mg twice daily from 07/27/2019 through 08/04/2019, then 125 mg daily starting 08/04/2019 through 08/11/2019, 125 mg every other day starting 08/11/2019 through 08/19/2019, then 125 mg 25 mg every 3 days for 1 week starting 08/19/2019)  2)Repeat CBC and BMP around 07/26/2019  3) prednisone taper as ordered  4)Novolog Insulin subcu per sliding scale- insulin aspart (novoLOG) injection 0-9 Units 0-9 Units, Subcutaneous, 3 times daily with meals,  Correction coverage: Sensitive  CBG < 70: Implement Hypoglycemia Standing Orders and refer to Hypoglycemia Standing Orders sidebar report CBG 70 - 120: 0 units CBG 121 - 150: 1 unit CBG 151 - 200: 2 units CBG 201 - 250: 3 units CBG 251 - 300: 5 units CBG 301 - 350: 7 units CBG 351 - 400: 9 units CBG > 400  Admission Diagnosis  Diarrhea of presumed infectious origin [R19.7] Colitis [K52.9] Nausea [R11.0] Paroxysmal atrial fibrillation (HCC) [I48.0]   Discharge Diagnosis  Diarrhea of presumed infectious origin [R19.7] Colitis [K52.9] Nausea [R11.0] Paroxysmal atrial fibrillation (HCC) [I48.0]    Active Problems:   Ulcerative colitis (HCC)   Diarrhea   Colitis   Nausea   Paroxysmal atrial fibrillation (HCC)     Past Medical History:  Diagnosis Date   Anemia    Anginal pain ([place])    PATIENT STATES DOCT THINKS IS FIBROMYALGIA   Arthritis    Asthma    Atrial fibrillation ([place])    Blood clot in lung last march   Cancer [facility])    1980   CERVICAL   Chronic back pain    COPD (chronic obstructive pulmonary disease) ([place])    DDD (degenerative disc disease)    Degenerative tear of posterior horn of lateral meniscus of right knee 01/27/2014   Depression    Diabetes mellitus without complication (HCC)    Diverticulitis    Fibromyalgia    GERD (gastroesophageal reflux disease)    Headache    HX MIGRAINES     Hiatal hernia    HOH (hard of hearing)    right   Pancreatitis    Pneumonia    1 YR   PONV (postoperative nausea and vomiting)    only happened after knee arthroscopy   Primary localized osteoarthritis of right knee 12/11/2015   PVC's (premature ventricular contractions)    Schatzki's ring    Seizures ([place])    had febrile seizures as child; none since childhood and on no meds   Shortness of breath dyspnea    WITH EXERTION    Sleep apnea    CPAP at night   Ulcerative colitis 2007  Wears dentures    full top-partial bottom    Past Surgical History:  Procedure Laterality Date   arch and foot     BALLOON DILATION  11/17/2018   Procedure: BALLOON DILATION;  Surgeon: Daneil Dolin, MD;  [facility];  Service: Endoscopy;;  esophagus   BIOPSY  06/01/2019   Procedure: BIOPSY;  Surgeon: Daneil Dolin, MD;  [facility];  Service: Endoscopy;;   CERVICAL [place]E BIOPSY     CHOLECYSTECTOMY     COLONOSCOPY  12/03/05   Rourk-marked inflammatory changes of the rectum, left colon, pan colitis, internal hemorrhoids, sigmoid diverticula   COLONOSCOPY N/A 10/20/2013   Dr. Rourk:Colonic diverticulosis. colonic mucosal ulcerations involving segment of sigmoid colon, atypical for UC. sigmoid colon path with chronic mildly active colitis consistent with IBD, other biopsies including ascending, transverse, descending colon and rectum unremarkable.    COLONOSCOPY N/A 11/19/2016   Dr. Gala Romney: Diverticulosis, 7 millimeter polyp removed from the descending colon, internal hemorrhoids,  segmental biopsies unremarkable.   DILATION AND CURETTAGE OF UTERUS     ESOPHAGEAL DILATION N/A 09/20/2015   Procedure: ESOPHAGEAL DILATION;  Surgeon: Daneil Dolin, MD;  [facility];  Service: Endoscopy;  Laterality: N/A;   ESOPHAGOGASTRODUODENOSCOPY  01/28/2011   Rourk- Schatzki ring, s/p 76F, otherwise normal/Hiatal hernia otherwise normal stomach D1 and D2   ESOPHAGOGASTRODUODENOSCOPY  11/30/08   Rourk-Schatzki's ring status post dilation, hiatal hernia   ESOPHAGOGASTRODUODENOSCOPY  07/12/2007   Dilation with 2 French, Schatzki's ring   ESOPHAGOGASTRODUODENOSCOPY N/A 08/10/2014   Dr.Rourk- prominent schatzki's ring s/p maloney dilation and disruption, moderate sized hiatal hernia   ESOPHAGOGASTRODUODENOSCOPY N/A 09/20/2015   RMR: Schatzki's ring status post dilation as described above. Rather large hiatal hernia; somewhat baggy atonic esophageal body' otherwise normal EGD   ESOPHAGOGASTRODUODENOSCOPY N/A 11/19/2016   Dr. Gala Romney: moderate Schatzki ring s/p dirsuption with biopsy forceps, large hiatal hernia   ESOPHAGOGASTRODUODENOSCOPY N/A 11/17/2018   Procedure: ESOPHAGOGASTRODUODENOSCOPY (EGD);  Surgeon: Daneil Dolin, MD;  [facility];  Service: Endoscopy;  Laterality: N/A;  12:00PM   ESOPHAGOGASTRODUODENOSCOPY (EGD) WITH ESOPHAGEAL DILATION  09/01/2012   Dr. Gala Romney- schatzki's ring, hiatal hernia   ESOPHAGOGASTRODUODENOSCOPY (EGD) WITH PROPOFOL N/A 02/08/2018   Procedure: ESOPHAGOGASTRODUODENOSCOPY (EGD) WITH PROPOFOL;  Surgeon: Daneil Dolin, MD;  [facility];  Service: Endoscopy;  Laterality: N/A;  10:30am   FLEXIBLE SIGMOIDOSCOPY N/A 06/01/2019   Procedure: FLEXIBLE SIGMOIDOSCOPY;  Surgeon: Daneil Dolin, MD;  [facility];  Service: Endoscopy;  Laterality: N/A;   FOOT ARTHRODESIS  2007,2010   both feet   HERNIA REPAIR     umbilical   JOINT REPLACEMENT Right    KNEE ARTHROSCOPY WITH LATERAL MENISECTOMY Right 01/27/2014     Procedure: RIGHT KNEE ARTHROSCOPY WITH PARTIAL LATERAL MENISECTOMY, ;  Surgeon: Johnny Bridge, MD;  Location: [place];  Service: Orthopedics;  Laterality: Right;   MALONEY DILATION N/A 08/10/2014   Procedure: [name] DILATION;  Surgeon: Daneil Dolin, MD;  [facility];  Service: Endoscopy;  Laterality: N/A;   MALONEY DILATION N/A 11/19/2016   Procedure: [name] DILATION;  Surgeon: Daneil Dolin, MD;  [facility];  Service: Endoscopy;  Laterality: N/A;   MALONEY DILATION N/A 02/08/2018   Procedure: [name] DILATION;  Surgeon: Daneil Dolin, MD;  [facility];  Service: Endoscopy;  Laterality: N/A;   MALONEY DILATION N/A 11/17/2018   Procedure: [name] DILATION;  Surgeon: Manus Rudd  M, MD;  [facility];  Service: Endoscopy;  Laterality: N/A;   SAVORY DILATION N/A 08/10/2014   Procedure: SAVORY DILATION;  Surgeon: Daneil Dolin, MD;  [facility];  Service: Endoscopy;  Laterality: N/A;   SHOULDER OPEN ROTATOR CUFF REPAIR     BILAT   TONSILLECTOMY     TOTAL KNEE ARTHROPLASTY Right 12/11/2015   Procedure: TOTAL KNEE ARTHROPLASTY;  Surgeon: Marchia Bond, MD;  Location: [place];  Service: Orthopedics;  Laterality: Right;       HPI  from the history and physical done on the day of admission:    - Alyvia Derk  is a 68 y.o. female, with past medical history for A. fib, on Eliquis, chronic back pain, COPD, on room air, DDD, ulcerative colitis, C. difficile colitis, with recent prolonged hospitalization secondary to colitis related to ulcerative colitis and C. difficile colitis, she was discharged on prednisone taper, and vancomycin taper, patient/her husband reports she has been compliant with her regimen, patient reports for last 3 days, she has been having worsening diarrhea, 6-10 times a day, so far she had 6 episodes for today, as well she is having persistent nausea, but no vomiting, no coffee-ground  emesis, as well she has been having poor appetite, abdominal pain, crampy, nonradiating, reports some minimal amount of blood in her stool, but report is always been like that, no NSAIDs use, no alcohol use, she is on Eliquis for A. fib, no fever, no chills, no cough, no shortness of breath, no COVID-19 exposure. -in ED patient noted to be in A. fib with RVR requiring Cardizem drip, potassium 3.2, CT abdomen and pelvis significant for left side: Colitis, I was called to admit.     Hospital Course:   - Brief Narrative:  68 y.o.female,with past medical history for A. fib, on Eliquis, chronic back pain, COPD, on room air, DDD, ulcerative colitis, C. difficile colitis, with recent prolonged hospitalization secondary to colitis related to ulcerative colitis and C. difficile colitis, she was discharged on prednisone taper, and vancomycin taper, patient/her husband reports she has been compliant with her regimen, patient reports for last 3 days, she has been having worsening diarrhea, 6-10 times a day, so far she had 6 episodes for today, as well she is having persistent nausea, but no vomiting, no coffee-ground emesis, as well she has been having poor appetite, abdominal pain, crampy, nonradiating, reports some minimal amount of blood in her stool, but report is always been like that, no NSAIDs use, no alcohol use, she is on Eliquis for A. fib, no fever, no chills, no cough, no shortness of breath, no COVID-19 exposure. -in EDpatient noted to be in A. fib with RVR requiring Cardizem drip, potassium 3.2, CT abdomen and pelvis significant for left side: Colitis, [facility] called for admission.   Assessment & Plan: 1-abdominal pain/diarrhea secondary to colitis -No further watery stools, -With concern for ulcerative colitis flare versus C. difficile reinfection -Okay to taper p.o. steroids as ordered - oral vancomycin taper (125 mg 4 times daily from 07/13/2019 through 07/27/2019, then 125 mg twice daily from  07/27/2019 through 08/04/2019, then 125 mg daily starting 08/04/2019 through 08/11/2019, 125 mg every other day starting 08/11/2019 through 08/19/2019, then 125 mg 25 mg every 3 days for 1 week starting 08/19/2019) --G consult and input appreciated -C. difficile by PCR positive, toxin results negative. --PPI discontinued; will continue H2 blocker.. -Patient currently tolerating diet well. -Continue advancing diet as tolerated, tolerating dysphagia 2 diet  with thin liquids well--may advance to mechanically soft diet  PT recommends SNF;  COVID Neg  2-Paroxysmal atrial fibrillation with RVR on presentation(HCC) -Heart rate improved -Off Cardizem drip  -Blood pressure stable.  -Patient remains in atrial fibrillation --Metoprolol adjusted to 25 mg twice daily due to soft BP  -continue Eliquis for anticoagulation. - 3--type 2 diabetes with hyperglycemia -A1c 9.9 -CBGs elevated most likely in the setting of steroids usage,  -Anticipate improvement in glycemic control with steroid taper  -Discharged on Lantus insulin 14 units nightly along with sliding scale insulin with NovoLog coverage   4-COPD -Continue bronchodilators as needed -Stable without acute exacerbation  5-anxiety/depression -Mood is stable -No suicidal ideation or hallucination -Continue buspirone and Cymbalta  6-GERD -Continue famotidine.  -PPI discontinued in the setting of concerns for C. difficile infection.  7-hyperlipidemia -Continue statins.  8physical deconditioning -PT eval appreciated transfer to SNF for rehab -Patient had a fall on 07/18/2019 while out of bed; CT head was negative for acute fractures or hemorrhage.   DVT prophylaxis: Apixaban. Code Status: Full code Family Communication: Husband at bedside.  Disposition Plan:  SNF rehab Consultants:   GI service  Procedures:   See below for x-ray reports.  Discharge Condition: - stable  Follow UP  Contact information for  after-discharge care    [facility] .   Service: Skilled Nursing Contact information: [address] [place] [phone]             Diet and Activity recommendation:  As advised  Discharge Instructions    Discharge Instructions    Call MD for:  difficulty breathing, headache or visual disturbances   Complete by: As directed    Call MD for:  persistant dizziness or light-headedness   Complete by: As directed    Call MD for:  persistant nausea and vomiting   Complete by: As directed    Call MD for:  severe uncontrolled pain   Complete by: As directed    Call MD for:  temperature >100.4   Complete by: As directed    Diet - low sodium heart healthy   Complete by: As directed    -Mechanically soft diet advised   Discharge instructions   Complete by: As directed    1)Take oral Vancomycin taper as follows ---oral vancomycin taper (125 mg 4 times daily from 07/13/2019 through 07/27/2019, then 125 mg twice daily from 07/27/2019 through 08/04/2019, then 125 mg daily starting 08/04/2019 through 08/11/2019, 125 mg every other day starting 08/11/2019 through 08/19/2019, then 125 mg 25 mg every 3 days for 1 week starting 08/19/2019)  2)Repeat CBC and BMP around 07/26/2019  3) prednisone taper as ordered  4)Novolog Insulin subcu per sliding scale- insulin aspart (novoLOG) injection 0-9 Units 0-9 Units, Subcutaneous, 3 times daily with meals,  Correction coverage: Sensitive  CBG < 70: Implement Hypoglycemia Standing Orders and refer to Hypoglycemia Standing Orders sidebar report CBG 70 - 120: 0 units CBG 121 - 150: 1 unit CBG 151 - 200: 2 units CBG 201 - 250: 3 units CBG 251 - 300: 5 units CBG 301 - 350: 7 units CBG 351 - 400: 9 units CBG > 400   Increase activity slowly   Complete by: As directed        Discharge Medications     Allergies as of 07/21/2019      Reactions   Mesalamine Other (See Comments)     Pancreatitis  Cefprozil Other (See Comments)   Aggravates Ulcerative colitis    Cefuroxime Axetil Other (See Comments)   Aggravates Ulcerative colitis    Morphine And Related Hives, Itching   Penicillins Hives   Has patient had a PCN reaction causing immediate rash, facial/tongue/throat swelling, SOB or lightheadedness with hypotension: No Has patient had a PCN reaction causing severe rash involving mucus membranes or skin necrosis: No Has patient had a PCN reaction that required hospitalization No Has patient had a PCN reaction occurring within the last 10 years: yes If all of the above answers are "NO", then may proceed with Cephalosporin use.   Eggs Or Egg-derived Products    Tur[place]-sweet Potatoes-peaches [alitraq]    Aspirin Other (See Comments)   Affects the central nervous system in high doses . "shakey"   Chicken Allergy Nausea And Vomiting, Rash   Clarithromycin Nausea And Vomiting   Codeine Other (See Comments)   Hallucinations/bad dreams.   Entex Other (See Comments)   insomnia      Medication List    STOP taking these medications   buPROPion 150 MG 24 hr tablet Commonly known as: WELLBUTRIN XL   esomeprazole 40 MG capsule Commonly known as: NEXIUM   Pepcid Complete 10-800-165 MG chewable tablet Generic drug: famotidine-calcium carbonate-magnesium hydroxide     TAKE these medications   acetaminophen 500 MG tablet Commonly known as: TYLENOL Take 1,000 mg by mouth every 6 (six) hours as needed for moderate pain or headache.   Alaway 0.025 % ophthalmic solution Generic drug: ketotifen Place 1 drop into both eyes daily.   albuterol (2.5 MG/3ML) 0.083% nebulizer solution Commonly known as: PROVENTIL Take 2.5 mg by nebulization every 6 (six) hours as needed for wheezing or shortness of breath.   albuterol 90 MCG/ACT inhaler Commonly known as: PROVENTIL,VENTOLIN Inhale 2 puffs into the lungs every 6 (six) hours as needed for wheezing or shortness of  breath.   azelastine 0.1 % nasal spray Commonly known as: ASTELIN 2 sprays by Nasal route 2 (two) times daily. Use in each nostril as directed   balsalazide 750 MG capsule Commonly known as: Colazal Take 3 capsules (2,250 mg total) by mouth 3 (three) times daily.   busPIRone 15 MG tablet Commonly known as: BUSPAR Take 7.5 mg by mouth 2 (two) times daily.   Calcium-Vitamin D 600-400 MG-UNIT Tabs Take 1 tablet by mouth 2 (two) times daily.   cetirizine 10 MG tablet Commonly known as: ZYRTEC Take 10 mg by mouth daily.   dicyclomine 10 MG capsule Commonly known as: BENTYL Take 1 capsule (10 mg total) by mouth 2 (two) times daily before a meal.   DULoxetine 30 MG capsule Commonly known as: CYMBALTA Take 120 mg by mouth daily.   Eliquis 5 MG Tabs tablet Generic drug: apixaban Take 1 tablet (5 mg total) by mouth 2 (two) times daily.   famotidine 20 MG tablet Commonly known as: PEPCID Take 1 tablet (20 mg total) by mouth 2 (two) times daily.   ferrous sulfate 325 (65 FE) MG tablet Take 325 mg by mouth daily with breakfast.   fluticasone 50 MCG/ACT nasal spray Commonly known as: FLONASE Place 2 sprays into both nostrils daily.   Fluticasone-Salmeterol 500-50 MCG/DOSE Aepb Commonly known as: Advair Diskus Inhale one puff twice daily   FREESTYLE LITE test strip Generic drug: glucose blood   insulin aspart 100 UNIT/ML injection Commonly known as: NovoLOG insulin aspart (novoLOG) injection 0-9 Units 0-9 Units, Subcutaneous, 3 times daily with meals, Correction coverage:  Sensitive CBG < 70: Implement Hypoglycemia Standing Orders and refer to Hypoglycemia Standing Orders sidebar report CBG 70 - 120: 0 units CBG 121 - 150: 1 unit CBG 151 - 200: 2 units CBG 201 - 250: 3 units CBG 251 - 300: 5 units CBG 301 - 350: 7 units CBG 351 - 400: 9 units CBG > 400   insulin glargine 100 UNIT/ML injection Commonly known as: LANTUS Inject 0.14 mLs (14 Units total) into the skin at  bedtime.   lidocaine 5 % Commonly known as: LIDODERM Place 1-2 patches onto the skin daily as needed (pain). Remove & Discard patch within 12 hours or as directed by MD   metoprolol tartrate 25 MG tablet Commonly known as: LOPRESSOR Take 1 tablet (25 mg total) by mouth 2 (two) times daily. What changed:   medication strength  how much to take   pravastatin 20 MG tablet Commonly known as: PRAVACHOL TAKE 1 TABLET DAILY   predniSONE 10 MG tablet Commonly known as: DELTASONE Take 1 tablet (10 mg total) by mouth See admin instructions. Take 20 mg daily for 5 days then 10 mg daily for 5 days and then stop-always take with food What changed:   how much to take  how to take this  when to take this  additional instructions   pregabalin 100 MG capsule Commonly known as: LYRICA Take 100 mg by mouth 2 (two) times daily.   traZODone 100 MG tablet Commonly known as: DESYREL Take 100 mg by mouth at bedtime.   vancomycin 50 mg/mL  oral solution Commonly known as: VANCOCIN oral vancomycin taper (125 mg 4 times daily from 07/13/2019 through 07/27/2019, then 125 mg twice daily from 07/27/2019 through 08/04/2019, then 125 mg daily starting 08/04/2019 through 08/11/2019, 125 mg every other day starting 08/11/2019 through 08/19/2019, then 125 mg 25 mg every 3 days for 1 week starting 08/19/2019) What changed: additional instructions       Major procedures and Radiology Reports - PLEASE review detailed and final reports for all details, in brief -      Ct Head Wo Contrast  Result Date: 07/18/2019 CLINICAL DATA:  Head trauma, headache. Inpatient fall going to restroom. EXAM: CT HEAD WITHOUT CONTRAST TECHNIQUE: Contiguous axial images were obtained from the base of the skull through the vertex without intravenous contrast. COMPARISON:  None. FINDINGS: Brain: No intracranial hemorrhage, mass effect, or midline shift. Age related atrophy. Mild chronic small vessel ischemia. Cavum septum  pellucidum, normal variant. No hydrocephalus. The basilar cisterns are patent. No evidence of territorial infarct or acute ischemia. No extra-axial or intracranial fluid collection. Vascular: No hyperdense vessel. Skull: No fracture or focal lesion. Sinuses/Orbits: Paranasal sinuses and mastoid air cells are clear. The visualized orbits are unremarkable. Other: None. IMPRESSION: No acute intracranial abnormality. No skull fracture. Electronically Signed   By: Keith Rake M.D.   On: 07/18/2019 06:38   Ct Abdomen Pelvis W Contrast  Result Date: 07/13/2019 CLINICAL DATA:  Diarrhea for 2-3 days. Prior admission 3 weeks ago for C. difficile colitis. EXAM: CT ABDOMEN AND PELVIS WITH CONTRAST TECHNIQUE: Multidetector CT imaging of the abdomen and pelvis was performed using the standard protocol following bolus administration of intravenous contrast. CONTRAST:  19m OMNIPAQUE IOHEXOL 300 MG/ML  SOLN COMPARISON:  05/20/2019 FINDINGS: Lower chest: Large hiatal hernia containing most of stomach. The gastroesophageal junction is well above the hiatus. There some organoaxial rotation of the herniated stomach. Upper normal heart size. Passive atelectasis associated with the large hiatal hernia.  Descending thoracic aortic atherosclerotic vascular disease. Hepatobiliary: Cholecystectomy. The liver appears otherwise unremarkable. No biliary dilatation. 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 Do not directly observe an ectopic ureterocele. No scarring or hydronephrosis. The right kidney appears unremarkable. Adrenal glands normal. Stomach/Bowel: As noted above most of stomach is herniated into the chest. Small periampullary duodenal diverticulum. There is abnormal wall thickening and mucosal enhancement in the cecum and ascending colon. Distal descending and sigmoid  colon diverticulosis with very faint adjacent stranding in the mesentery, less than on 05/20/2019, but still potentially manifestation of low-grade diverticulitis. Mildly accentuated mucosal enhancement in the rectum with small rectal air fluid levels. Tiny localized amount of gas adjacent the terminal ileum on image 80/6 which is probably in a small diverticulum, similar appearance suggested on image 47/2 of 05/20/2019 exam. Scattered nondilated jejunal loops with air-fluid levels. Vascular/Lymphatic: Aortoiliac atherosclerotic vascular disease. Widely patent celiac trunk SMA. Patent IMA. Reproductive: Unremarkable Other: No supplemental non-categorized findings. Musculoskeletal: Small periumbilical hernia mesh. Lower thoracic and lumbar spondylosis and degenerative disc disease. IMPRESSION: 1. Abnormal mucosal enhancement wall thickening the cecum and ascending colon favoring colitis. There is some questionable proximal sigmoid diverticulitis, as well as accentuated mucosa enhancement in the sigmoid colon and rectum. The appearance is most compatible with colitis perhaps very low-grade proximal diverticulitis. Favor infectious colitis or inflammatory bowel disease over ischemic colitis given the wide patency of the mesenteric vessels. Given the patient's history, correlation with C difficile toxins is recommended to exclude recurrent C difficile colitis. 2. Large hiatal hernia with some organoaxial rotation of the herniated stomach. 3. 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 4.  Aortic Atherosclerosis (ICD10-I70.0). Electronically Signed   By: Van Clines M.D.   On: 07/13/2019 16:56    Micro Results     Recent Results (from the past 240 hour(s))  SARS CORONAVIRUS 2 (TAT 6-24 HRS) Nasopharyngeal Nasopharyngeal Swab     Status: None   Collection Time: 07/13/19  5:08 PM   Specimen: Nasopharyngeal Swab  Result Value Ref Range Status   SARS Coronavirus 2  NEGATIVE NEGATIVE Final    Comment: (NOTE) SARS-CoV-2 target nucleic acids are NOT DETECTED. The SARS-CoV-2 RNA is generally detectable in upper and lower respiratory specimens during the acute phase of infection. Negative results do not preclude SARS-CoV-2 infection, do not rule out co-infections with other pathogens, and should not be used as the sole basis for treatment or other patient management decisions. Negative results must be combined with clinical observations, patient history, and epidemiological information. The expected result is Negative. Fact Sheet for Patients: SugarRoll.be Fact Sheet for Healthcare Providers: https://www.woods-mathews.com/ This test is not yet approved or cleared by the [place] FDA and  has been authorized for detection and/or diagnosis of SARS-CoV-2 by FDA under an Emergency Use Authorization (EUA). This EUA will remain  in effect (meaning this test can be used) for the duration of the COVID-19 declaration under Section 56 4(b)(1) of the Act, 21 U.S.C. section 360bbb-3(b)(1), unless the authorization is terminated or revoked sooner. Performed at [facility], [place] [address] [place], [place] [address]   MRSA PCR Screening     Status: None   Collection Time: 07/13/19  7:58 PM   Specimen: Nasal Mucosa; Nasopharyngeal  Result Value Ref Range Status   MRSA by PCR NEGATIVE NEGATIVE Final    Comment:  The GeneXpert MRSA Assay (FDA approved for NASAL specimens only), is one component of a comprehensive MRSA colonization surveillance program. It is not intended to diagnose MRSA infection nor to guide or monitor treatment for MRSA infections. Performed at [facility], [address] [place], [place] [address]   GI pathogen panel by PCR, stool     Status: None   Collection Time: 07/14/19 12:00 PM   Specimen: Stool  Result Value Ref Range Status   Plesiomonas shigelloides NOT PERFORMED   Final    Comment: Test not performed   Yersinia enterocolitica NOT PERFORMED  Final    Comment: Test not performed   Vibrio NOT PERFORMED  Final    Comment: Test not performed   Enteropathogenic E coli NOT PERFORMED  Final    Comment: Test not performed   E coli (ETEC) LT/ST NOT PERFORMED  Final    Comment: Test not performed   E coli 0157 by PCR NOT PERFORMED  Final    Comment: Test not performed   Cryptosporidium by PCR NOT PERFORMED  Final    Comment: Test not performed   Entamoeba histolytica NOT PERFORMED  Final    Comment: Test not performed   Adenovirus F 40/41 NOT PERFORMED  Final    Comment: Test not performed   Norovirus GI/GII NOT PERFORMED  Final    Comment: Test not performed   Sapovirus NOT PERFORMED  Final    Comment: (NOTE) Test not performed Performed At: [facility] [place], [place] [id number] Rush Farmer MD OX:[id number]    Vibrio cholerae NOT PERFORMED  Final    Comment: Test not performed   Campylobacter by PCR CANRGT  Final    Comment: (NOTE) Test not performed. Unable to perform test due to current unavailability of reagents or discontinuation of test.      Vinnie Level T notified 07/19/2019    Salmonella by PCR NOT PERFORMED  Final    Comment: Test not performed   E coli (STEC) NOT PERFORMED  Final    Comment: Test not performed   Enteroaggregative E coli NOT PERFORMED  Final    Comment: Test not performed   Shigella by PCR NOT PERFORMED  Final    Comment: Test not performed   Cyclospora cayetanensis NOT PERFORMED  Final    Comment: Test not performed   Astrovirus NOT PERFORMED  Final    Comment: Test not performed   G lamblia by PCR NOT PERFORMED  Final    Comment: Test not performed   Rotavirus A by PCR NOT PERFORMED  Final    Comment: Test not performed  C Difficile Quick Screen w PCR reflex     Status: Abnormal   Collection Time: 07/14/19 12:00 PM   Specimen: Stool  Result Value Ref Range Status   C Diff antigen  POSITIVE (A) NEGATIVE Final   C Diff toxin NEGATIVE NEGATIVE Final   C Diff interpretation Results are indeterminate. See PCR results.  Final    Comment: Performed at [facility], [address], [place], [place] [address]  C. Diff by PCR, Reflexed     Status: Abnormal   Collection Time: 07/14/19 12:00 PM  Result Value Ref Range Status   Toxigenic C. Difficile by PCR POSITIVE (A) NEGATIVE Final    Comment: Positive for toxigenic C. difficile with little to no toxin production. Only treat if clinical presentation suggests symptomatic illness. Performed at [facility], [place] [address], [place], [place] [address]  SARS CORONAVIRUS 2 (TAT 6-24 HRS) Nasopharyngeal Nasopharyngeal Swab     Status: None   Collection Time: 07/19/19 11:37 AM   Specimen: Nasopharyngeal Swab  Result Value Ref Range Status   SARS Coronavirus 2 NEGATIVE NEGATIVE Final    Comment: (NOTE) SARS-CoV-2 target nucleic acids are NOT DETECTED. The SARS-CoV-2 RNA is generally detectable in upper and lower respiratory specimens during the acute phase of infection. Negative results do not preclude SARS-CoV-2 infection, do not rule out co-infections with other pathogens, and should not be used as the sole basis for treatment or other patient management decisions. Negative results must be combined with clinical observations, patient history, and epidemiological information. The expected result is Negative. Fact Sheet for Patients: SugarRoll.be Fact Sheet for Healthcare Providers: https://www.woods-mathews.com/ This test is not yet approved or cleared by the [place] FDA and  has been authorized for detection and/or diagnosis of SARS-CoV-2 by FDA under an Emergency Use Authorization (EUA). This EUA will remain  in effect (meaning this test can be used) for the duration of the COVID-19 declaration under Section 56 4(b)(1) of the Act, 21 U.S.C. section 360bbb-3(b)(1), unless  the authorization is terminated or revoked sooner. Performed at [facility], [place] [address], [place], [place] [address]        Today   Subjective    Michalene Debruler today has no new complaints, -Eating and drinking well -No further loose stools -No fevers, no vomiting and no abdominal pain          Patient has been seen and examined prior to discharge   Objective   Blood pressure 99/66, pulse 90, temperature 98.5 F (36.9 C), resp. rate 16, height 5' 4"  (1.626 m), weight 81.4 kg, SpO2 94 %.   Intake/Output Summary (Last 24 hours) at 07/21/2019 1048 Last data filed at 07/21/2019 0900 Gross per 24 hour  Intake 1340 ml  Output 500 ml  Net 840 ml    Exam Gen:- Awake Alert, no acute distress  HEENT:- [place].AT, No sclera icterus Neck-Supple Neck,No JVD,.  Lungs-  CTAB , good air movement bilaterally  CV- S1, S2 normal, regular Abd-  +ve B.Sounds, Abd Soft, No tenderness,    Extremity/Skin:- No  edema,   good pulses Psych-affect is appropriate, oriented x3 Neuro-generalized weakness, no new focal deficits, no tremors    Data Review   CBC w Diff:  Lab Results  Component Value Date   WBC 10.2 07/21/2019   HGB 11.5 (L) 07/21/2019   HCT 36.0 07/21/2019   PLT 221 07/21/2019   LYMPHOPCT 27 07/13/2019   MONOPCT 9 07/13/2019   EOSPCT 1 07/13/2019   BASOPCT 1 07/13/2019   CMP:  Lab Results  Component Value Date   NA 139 07/21/2019   NA 137 02/07/2012   K 3.3 (L) 07/21/2019   K 4.5 02/07/2012   CL 103 07/21/2019   CL 100 02/07/2012   CO2 26 07/21/2019   BUN 13 07/21/2019   BUN 12 02/07/2012   CREATININE 0.76 07/21/2019   CREATININE 0.90 03/10/2019   PROT 5.2 (L) 07/21/2019   PROT 6.8 02/07/2012   ALBUMIN 2.6 (L) 07/21/2019   ALBUMIN 4.3 02/07/2012   BILITOT 0.4 07/21/2019   BILITOT 0.3 02/07/2012   ALKPHOS 56 07/21/2019   ALKPHOS 70 02/07/2012   AST 11 (L) 07/21/2019   AST 12 02/07/2012   ALT 21 07/21/2019   Total Discharge time is about 33  minutes  Roxan Hockey M.D on 07/21/2019 at 10:48 AM  Go to [email or web address] -  for contact info  [facility] - Office  [phone]

## 2019-07-21 NOTE — TOC Transition Note (Signed)
Transition of Care [facility]) - CM/SW Discharge Note   Patient Details  Name: [name] MRN: [id number] Date of Birth: August 09, 1951  Transition of Care [facility]) CM/SW Contact:  Boneta Lucks, RN Phone Number: 07/21/2019, 11:19 AM   Clinical Narrative:   Patient discharging to [facility] today. Verified they are ready for patient and she is COVID -neg.  Called Marc Morgans- husband to updated him with dc planning.      Final next level of care: Skilled Nursing Facility Barriers to Discharge: Barriers Resolved   Patient Goals and CMS Choice Patient states their goals for this hospitalization and ongoing recovery are:: To feel better and return home.      Discharge Placement              Patient chooses bed at: [facility] Patient to be transferred to facility by: EMS Name of family member notified: Marc Morgans - husband Patient and family notified of of transfer: 07/21/19  Discharge Plan and Services     Post Acute Care Choice: Durable Medical Equipment                Social Determinants of Health (SDOH) Interventions     Readmission Risk Interventions Readmission Risk Prevention Plan 07/19/2019 06/15/2019 06/15/2019  Post Dischage Appt - - -  Transportation Screening - - -  PCP or Specialist Appt within 5-7 Days Not Complete - -  Not Complete comments Pt going to SNF - -  PCP or Specialist Appt within 3-5 Days - Not Complete -  Not Complete comments - Left message for hospital discharge follow up coordinator at PCP office requesting return contact to schedule appointment. -  Home Care Screening Not Complete - -  Home Care Screening Not Completed Comments Pt going to SNF - -  Medication Review (RN CM) Complete - -  HRI or Home Care Consult - - Complete  HRI or Home Care Consult comments - - -  Social Work Consult for [place] - - Not Applicable  Medication Review (RN Care Manager) - - -  Some recent  data might be hidden

## 2019-07-21 NOTE — Progress Notes (Signed)
Nsg Discharge Note  Admit Date:  07/13/2019 Discharge date: 07/21/2019   [name] to be D/C'd to [facility] per MD order.  AVS completed.  Copy for chart, and copy for patient signed, and dated. Patient/caregiver able to verbalize understanding.  Discharge Medication: Allergies as of 07/21/2019      Reactions   Mesalamine Other (See Comments)   Pancreatitis    Cefprozil Other (See Comments)   Aggravates Ulcerative colitis    Cefuroxime Axetil Other (See Comments)   Aggravates Ulcerative colitis    Morphine And Related Hives, Itching   Penicillins Hives   Has patient had a PCN reaction causing immediate rash, facial/tongue/throat swelling, SOB or lightheadedness with hypotension: No Has patient had a PCN reaction causing severe rash involving mucus membranes or skin necrosis: No Has patient had a PCN reaction that required hospitalization No Has patient had a PCN reaction occurring within the last 10 years: yes If all of the above answers are "NO", then may proceed with Cephalosporin use.   Eggs Or Egg-derived Products    Turkey-sweet Potatoes-peaches [alitraq]    Aspirin Other (See Comments)   Affects the central nervous system in high doses . "shakey"   Chicken Allergy Nausea And Vomiting, Rash   Clarithromycin Nausea And Vomiting   Codeine Other (See Comments)   Hallucinations/bad dreams.   Entex Other (See Comments)   insomnia      Medication List    STOP taking these medications   buPROPion 150 MG 24 hr tablet Commonly known as: WELLBUTRIN XL   esomeprazole 40 MG capsule Commonly known as: NEXIUM   Pepcid Complete 10-800-165 MG chewable tablet Generic drug: famotidine-calcium carbonate-magnesium hydroxide     TAKE these medications   acetaminophen 500 MG tablet Commonly known as: TYLENOL Take 1,000 mg by mouth every 6 (six) hours as needed for moderate pain or headache.   Alaway 0.025 % ophthalmic solution Generic drug: ketotifen Place 1  drop into both eyes daily.   albuterol (2.5 MG/3ML) 0.083% nebulizer solution Commonly known as: PROVENTIL Take 2.5 mg by nebulization every 6 (six) hours as needed for wheezing or shortness of breath.   albuterol 90 MCG/ACT inhaler Commonly known as: PROVENTIL,VENTOLIN Inhale 2 puffs into the lungs every 6 (six) hours as needed for wheezing or shortness of breath.   azelastine 0.1 % nasal spray Commonly known as: ASTELIN 2 sprays by Nasal route 2 (two) times daily. Use in each nostril as directed   balsalazide 750 MG capsule Commonly known as: Colazal Take 3 capsules (2,250 mg total) by mouth 3 (three) times daily.   busPIRone 15 MG tablet Commonly known as: BUSPAR Take 7.5 mg by mouth 2 (two) times daily.   Calcium-Vitamin D 600-400 MG-UNIT Tabs Take 1 tablet by mouth 2 (two) times daily.   cetirizine 10 MG tablet Commonly known as: ZYRTEC Take 10 mg by mouth daily.   dicyclomine 10 MG capsule Commonly known as: BENTYL Take 1 capsule (10 mg total) by mouth 2 (two) times daily before a meal.   DULoxetine 30 MG capsule Commonly known as: CYMBALTA Take 120 mg by mouth daily.   Eliquis 5 MG Tabs tablet Generic drug: apixaban Take 1 tablet (5 mg total) by mouth 2 (two) times daily.   famotidine 20 MG tablet Commonly known as: PEPCID Take 1 tablet (20 mg total) by mouth 2 (two) times daily.   ferrous sulfate 325 (65 FE) MG tablet Take 325 mg by mouth daily with breakfast.  fluticasone 50 MCG/ACT nasal spray Commonly known as: FLONASE Place 2 sprays into both nostrils daily.   Fluticasone-Salmeterol 500-50 MCG/DOSE Aepb Commonly known as: Advair Diskus Inhale one puff twice daily   FREESTYLE LITE test strip Generic drug: glucose blood   insulin aspart 100 UNIT/ML injection Commonly known as: NovoLOG insulin aspart (novoLOG) injection 0-9 Units 0-9 Units, Subcutaneous, 3 times daily with meals, Correction coverage: Sensitive CBG < 70: Implement Hypoglycemia  Standing Orders and refer to Hypoglycemia Standing Orders sidebar report CBG 70 - 120: 0 units CBG 121 - 150: 1 unit CBG 151 - 200: 2 units CBG 201 - 250: 3 units CBG 251 - 300: 5 units CBG 301 - 350: 7 units CBG 351 - 400: 9 units CBG > 400   insulin glargine 100 UNIT/ML injection Commonly known as: LANTUS Inject 0.14 mLs (14 Units total) into the skin at bedtime.   lidocaine 5 % Commonly known as: LIDODERM Place 1-2 patches onto the skin daily as needed (pain). Remove & Discard patch within 12 hours or as directed by MD   metoprolol tartrate 25 MG tablet Commonly known as: LOPRESSOR Take 1 tablet (25 mg total) by mouth 2 (two) times daily. What changed:   medication strength  how much to take   pravastatin 20 MG tablet Commonly known as: PRAVACHOL TAKE 1 TABLET DAILY   predniSONE 10 MG tablet Commonly known as: DELTASONE Take 1 tablet (10 mg total) by mouth See admin instructions. Take 20 mg daily for 5 days then 10 mg daily for 5 days and then stop-always take with food What changed:   how much to take  how to take this  when to take this  additional instructions   pregabalin 100 MG capsule Commonly known as: LYRICA Take 100 mg by mouth 2 (two) times daily.   traZODone 100 MG tablet Commonly known as: DESYREL Take 100 mg by mouth at bedtime.   vancomycin 50 mg/mL  oral solution Commonly known as: VANCOCIN oral vancomycin taper (125 mg 4 times daily from 07/13/2019 through 07/27/2019, then 125 mg twice daily from 07/27/2019 through 08/04/2019, then 125 mg daily starting 08/04/2019 through 08/11/2019, 125 mg every other day starting 08/11/2019 through 08/19/2019, then 125 mg 25 mg every 3 days for 1 week starting 08/19/2019) What changed: additional instructions       Discharge Assessment: Vitals:   07/21/19 0752 07/21/19 1409  BP:  113/83  Pulse:  (!) 125  Resp:  16  Temp:  98.5 F (36.9 C)  SpO2: 94% 100%   Skin clean, dry and intact without evidence of  skin break down, no evidence of skin tears noted. IV catheter discontinued intact. Site without signs and symptoms of complications - no redness or edema noted at insertion site, patient denies c/o pain - only slight tenderness at site.  Dressing with slight pressure applied.  D/c Instructions-Education: Discharge instructions given to patient/family with verbalized understanding. D/c education completed with patient/family including follow up instructions, medication list, d/c activities limitations if indicated, with other d/c instructions as indicated by MD - patient able to verbalize understanding, all questions fully answered. Patient instructed to return to ED, call 911, or call MD for any changes in condition.  Patient escorted via [place], and D/C home via private auto.  Venita Sheffield, RN 07/21/2019 2:20 PM

## 2019-07-21 NOTE — Discharge Instructions (Signed)
1)Take oral Vancomycin taper as follows ---oral vancomycin taper (125 mg 4 times daily from 07/13/2019 through 07/27/2019, then 125 mg twice daily from 07/27/2019 through 08/04/2019, then 125 mg daily starting 08/04/2019 through 08/11/2019, 125 mg every other day starting 08/11/2019 through 08/19/2019, then 125 mg 25 mg every 3 days for 1 week starting 08/19/2019)  2)Repeat CBC and BMP around 07/26/2019  3) prednisone taper as ordered  4)Novolog Insulin subcu per sliding scale- insulin aspart (novoLOG) injection 0-9 Units 0-9 Units, Subcutaneous, 3 times daily with meals,  Correction coverage: Sensitive  CBG < 70: Implement Hypoglycemia Standing Orders and refer to Hypoglycemia Standing Orders sidebar report CBG 70 - 120: 0 units CBG 121 - 150: 1 unit CBG 151 - 200: 2 units CBG 201 - 250: 3 units CBG 251 - 300: 5 units CBG 301 - 350: 7 units CBG 351 - 400: 9 units CBG > 400

## 2019-07-21 NOTE — Plan of Care (Signed)

## 2019-07-21 NOTE — Progress Notes (Signed)
  Speech Language Pathology Treatment: Dysphagia  Patient Details Name: SHANTIA SANFORD MRN: [id number] DOB: 10/03/1950 Today's Date: 07/21/2019 Time: 1206-1225 SLP Time Calculation (min) (ACUTE ONLY): 19 min  Assessment / Plan / Recommendation Clinical Impression  Pt reports that her swallowing is "much better" and no reports of globus sensation. Her husband has not brought in her dentures so she feels textures continue to be appropriate. Pt exhibits no signs of reduced airway protection with straw sips thin liquids. She needs reminders to sit upright for all eating/drinking and to remain upright for at least 30 minutes after meals. SLP will sign off and Pt to self upgrade once she has her dentures.    HPI HPI: 68 y.o. female, with past medical history for A. fib, on Eliquis, chronic back pain, COPD, on room air, DDD, ulcerative colitis, C. difficile colitis, with recent prolonged hospitalization secondary to colitis related to ulcerative colitis and C. difficile colitis, she was discharged on prednisone taper, and vancomycin taper, patient/her husband reports she has been compliant with her regimen, patient reports for last 3 days, she has been having worsening diarrhea, 6-10 times a day, so far she had 6 episodes for today, as well she is having persistent nausea, but no vomiting, no coffee-ground emesis, as well she has been having poor appetite, abdominal pain, crampy, nonradiating, reports some minimal amount of blood in her stool, but report is always been like that, no NSAIDs use, no alcohol use, she is on Eliquis for A. fib, no fever, no chills, no cough, no shortness of breath, no COVID-19 exposure. BSE requested due to Pt getting choked on carrots. Pt had EGD 11/17/18 with Dr. Gala Romney with need for disruption of Schatzki's ring.      SLP Plan  Discharge SLP treatment due to (comment)       Recommendations  Diet recommendations: Dysphagia 2 (fine chop);Thin liquid(advance to D3 as  able/per Pt preference) Liquids provided via: Cup;Straw Medication Administration: Whole meds with liquid Supervision: Patient able to self feed Compensations: Small sips/bites;Slow rate Postural Changes and/or Swallow Maneuvers: Seated upright 90 degrees;Upright 30-60 min after meal                Oral Care Recommendations: Oral care BID;Patient independent with oral care Follow up Recommendations: None SLP Visit Diagnosis: Dysphagia, unspecified (R13.10) Plan: Discharge SLP treatment due to (comment)       Thank you,  Genene Churn, [place]                 [place] 07/21/2019, 1:29 PM

## 2019-07-21 NOTE — Progress Notes (Signed)
Report called to Nonnie Done, LPN at [facility]. EMS called for transport. Husband at bedside and made aware.

## 2019-07-24 DIAGNOSIS — J449 Chronic obstructive pulmonary disease, unspecified: Secondary | ICD-10-CM | POA: Diagnosis not present

## 2019-07-24 DIAGNOSIS — E1165 Type 2 diabetes mellitus with hyperglycemia: Secondary | ICD-10-CM | POA: Diagnosis not present

## 2019-07-24 DIAGNOSIS — K5190 Ulcerative colitis, unspecified, without complications: Secondary | ICD-10-CM | POA: Diagnosis not present

## 2019-07-24 DIAGNOSIS — I4811 Longstanding persistent atrial fibrillation: Secondary | ICD-10-CM | POA: Diagnosis not present

## 2019-07-31 DIAGNOSIS — R410 Disorientation, unspecified: Secondary | ICD-10-CM | POA: Diagnosis not present

## 2019-07-31 DIAGNOSIS — I480 Paroxysmal atrial fibrillation: Secondary | ICD-10-CM | POA: Diagnosis not present

## 2019-07-31 DIAGNOSIS — G4730 Sleep apnea, unspecified: Secondary | ICD-10-CM | POA: Diagnosis present

## 2019-07-31 DIAGNOSIS — J449 Chronic obstructive pulmonary disease, unspecified: Secondary | ICD-10-CM | POA: Diagnosis present

## 2019-07-31 DIAGNOSIS — R41841 Cognitive communication deficit: Secondary | ICD-10-CM | POA: Diagnosis not present

## 2019-07-31 DIAGNOSIS — G4739 Other sleep apnea: Secondary | ICD-10-CM | POA: Diagnosis not present

## 2019-07-31 DIAGNOSIS — F330 Major depressive disorder, recurrent, mild: Secondary | ICD-10-CM | POA: Diagnosis not present

## 2019-07-31 DIAGNOSIS — Z880 Allergy status to penicillin: Secondary | ICD-10-CM | POA: Diagnosis not present

## 2019-07-31 DIAGNOSIS — E1165 Type 2 diabetes mellitus with hyperglycemia: Secondary | ICD-10-CM | POA: Diagnosis not present

## 2019-07-31 DIAGNOSIS — R197 Diarrhea, unspecified: Secondary | ICD-10-CM | POA: Diagnosis not present

## 2019-07-31 DIAGNOSIS — K219 Gastro-esophageal reflux disease without esophagitis: Secondary | ICD-10-CM | POA: Diagnosis not present

## 2019-07-31 DIAGNOSIS — E785 Hyperlipidemia, unspecified: Secondary | ICD-10-CM | POA: Diagnosis not present

## 2019-07-31 DIAGNOSIS — Z86711 Personal history of pulmonary embolism: Secondary | ICD-10-CM | POA: Diagnosis not present

## 2019-07-31 DIAGNOSIS — I959 Hypotension, unspecified: Secondary | ICD-10-CM | POA: Diagnosis not present

## 2019-07-31 DIAGNOSIS — A045 Campylobacter enteritis: Secondary | ICD-10-CM | POA: Diagnosis present

## 2019-07-31 DIAGNOSIS — D509 Iron deficiency anemia, unspecified: Secondary | ICD-10-CM | POA: Diagnosis not present

## 2019-07-31 DIAGNOSIS — G9341 Metabolic encephalopathy: Secondary | ICD-10-CM | POA: Diagnosis present

## 2019-07-31 DIAGNOSIS — R262 Difficulty in walking, not elsewhere classified: Secondary | ICD-10-CM | POA: Diagnosis not present

## 2019-07-31 DIAGNOSIS — I10 Essential (primary) hypertension: Secondary | ICD-10-CM | POA: Diagnosis present

## 2019-07-31 DIAGNOSIS — J189 Pneumonia, unspecified organism: Secondary | ICD-10-CM | POA: Diagnosis not present

## 2019-07-31 DIAGNOSIS — Z7401 Bed confinement status: Secondary | ICD-10-CM | POA: Diagnosis not present

## 2019-07-31 DIAGNOSIS — A419 Sepsis, unspecified organism: Secondary | ICD-10-CM | POA: Diagnosis present

## 2019-07-31 DIAGNOSIS — B961 Klebsiella pneumoniae [K. pneumoniae] as the cause of diseases classified elsewhere: Secondary | ICD-10-CM | POA: Diagnosis present

## 2019-07-31 DIAGNOSIS — F418 Other specified anxiety disorders: Secondary | ICD-10-CM | POA: Diagnosis not present

## 2019-07-31 DIAGNOSIS — Z8541 Personal history of malignant neoplasm of cervix uteri: Secondary | ICD-10-CM | POA: Diagnosis not present

## 2019-07-31 DIAGNOSIS — R1312 Dysphagia, oropharyngeal phase: Secondary | ICD-10-CM | POA: Diagnosis not present

## 2019-07-31 DIAGNOSIS — N390 Urinary tract infection, site not specified: Secondary | ICD-10-CM | POA: Diagnosis present

## 2019-07-31 DIAGNOSIS — E11649 Type 2 diabetes mellitus with hypoglycemia without coma: Secondary | ICD-10-CM | POA: Diagnosis present

## 2019-07-31 DIAGNOSIS — G4733 Obstructive sleep apnea (adult) (pediatric): Secondary | ICD-10-CM | POA: Diagnosis not present

## 2019-07-31 DIAGNOSIS — E119 Type 2 diabetes mellitus without complications: Secondary | ICD-10-CM | POA: Diagnosis not present

## 2019-07-31 DIAGNOSIS — I4891 Unspecified atrial fibrillation: Secondary | ICD-10-CM | POA: Diagnosis present

## 2019-07-31 DIAGNOSIS — M6281 Muscle weakness (generalized): Secondary | ICD-10-CM | POA: Diagnosis not present

## 2019-07-31 DIAGNOSIS — Z452 Encounter for adjustment and management of vascular access device: Secondary | ICD-10-CM | POA: Diagnosis not present

## 2019-07-31 DIAGNOSIS — F419 Anxiety disorder, unspecified: Secondary | ICD-10-CM | POA: Diagnosis not present

## 2019-07-31 DIAGNOSIS — R918 Other nonspecific abnormal finding of lung field: Secondary | ICD-10-CM | POA: Diagnosis not present

## 2019-07-31 DIAGNOSIS — R5381 Other malaise: Secondary | ICD-10-CM | POA: Diagnosis present

## 2019-07-31 DIAGNOSIS — Z87891 Personal history of nicotine dependence: Secondary | ICD-10-CM | POA: Diagnosis not present

## 2019-07-31 DIAGNOSIS — K51918 Ulcerative colitis, unspecified with other complication: Secondary | ICD-10-CM | POA: Diagnosis not present

## 2019-07-31 DIAGNOSIS — R110 Nausea: Secondary | ICD-10-CM | POA: Diagnosis not present

## 2019-07-31 DIAGNOSIS — Z20828 Contact with and (suspected) exposure to other viral communicable diseases: Secondary | ICD-10-CM | POA: Diagnosis present

## 2019-07-31 DIAGNOSIS — R404 Transient alteration of awareness: Secondary | ICD-10-CM | POA: Diagnosis not present

## 2019-07-31 DIAGNOSIS — J439 Emphysema, unspecified: Secondary | ICD-10-CM | POA: Diagnosis not present

## 2019-07-31 DIAGNOSIS — R4182 Altered mental status, unspecified: Secondary | ICD-10-CM | POA: Diagnosis not present

## 2019-07-31 DIAGNOSIS — Z7982 Long term (current) use of aspirin: Secondary | ICD-10-CM | POA: Diagnosis not present

## 2019-07-31 DIAGNOSIS — A0471 Enterocolitis due to Clostridium difficile, recurrent: Secondary | ICD-10-CM | POA: Diagnosis not present

## 2019-07-31 DIAGNOSIS — Z7901 Long term (current) use of anticoagulants: Secondary | ICD-10-CM | POA: Diagnosis not present

## 2019-07-31 DIAGNOSIS — E876 Hypokalemia: Secondary | ICD-10-CM | POA: Diagnosis present

## 2019-08-02 ENCOUNTER — Ambulatory Visit: Payer: Medicare Other | Admitting: Cardiology

## 2019-08-06 DIAGNOSIS — R197 Diarrhea, unspecified: Secondary | ICD-10-CM | POA: Diagnosis not present

## 2019-08-06 DIAGNOSIS — R296 Repeated falls: Secondary | ICD-10-CM | POA: Diagnosis not present

## 2019-08-06 DIAGNOSIS — A045 Campylobacter enteritis: Secondary | ICD-10-CM | POA: Diagnosis not present

## 2019-08-06 DIAGNOSIS — Z7401 Bed confinement status: Secondary | ICD-10-CM | POA: Diagnosis not present

## 2019-08-06 DIAGNOSIS — J449 Chronic obstructive pulmonary disease, unspecified: Secondary | ICD-10-CM | POA: Diagnosis not present

## 2019-08-06 DIAGNOSIS — F330 Major depressive disorder, recurrent, mild: Secondary | ICD-10-CM | POA: Diagnosis not present

## 2019-08-06 DIAGNOSIS — E785 Hyperlipidemia, unspecified: Secondary | ICD-10-CM | POA: Diagnosis not present

## 2019-08-06 DIAGNOSIS — E782 Mixed hyperlipidemia: Secondary | ICD-10-CM | POA: Diagnosis not present

## 2019-08-06 DIAGNOSIS — K5100 Ulcerative (chronic) pancolitis without complications: Secondary | ICD-10-CM | POA: Diagnosis not present

## 2019-08-06 DIAGNOSIS — E1165 Type 2 diabetes mellitus with hyperglycemia: Secondary | ICD-10-CM | POA: Diagnosis not present

## 2019-08-06 DIAGNOSIS — R41841 Cognitive communication deficit: Secondary | ICD-10-CM | POA: Diagnosis not present

## 2019-08-06 DIAGNOSIS — R9431 Abnormal electrocardiogram [ECG] [EKG]: Secondary | ICD-10-CM | POA: Diagnosis not present

## 2019-08-06 DIAGNOSIS — J90 Pleural effusion, not elsewhere classified: Secondary | ICD-10-CM | POA: Diagnosis not present

## 2019-08-06 DIAGNOSIS — G4733 Obstructive sleep apnea (adult) (pediatric): Secondary | ICD-10-CM | POA: Diagnosis not present

## 2019-08-06 DIAGNOSIS — R1312 Dysphagia, oropharyngeal phase: Secondary | ICD-10-CM | POA: Diagnosis not present

## 2019-08-06 DIAGNOSIS — R571 Hypovolemic shock: Secondary | ICD-10-CM | POA: Diagnosis not present

## 2019-08-06 DIAGNOSIS — R404 Transient alteration of awareness: Secondary | ICD-10-CM | POA: Diagnosis not present

## 2019-08-06 DIAGNOSIS — Z792 Long term (current) use of antibiotics: Secondary | ICD-10-CM | POA: Diagnosis not present

## 2019-08-06 DIAGNOSIS — R269 Unspecified abnormalities of gait and mobility: Secondary | ICD-10-CM | POA: Diagnosis not present

## 2019-08-06 DIAGNOSIS — I480 Paroxysmal atrial fibrillation: Secondary | ICD-10-CM | POA: Diagnosis not present

## 2019-08-06 DIAGNOSIS — K51918 Ulcerative colitis, unspecified with other complication: Secondary | ICD-10-CM | POA: Diagnosis not present

## 2019-08-06 DIAGNOSIS — R110 Nausea: Secondary | ICD-10-CM | POA: Diagnosis not present

## 2019-08-06 DIAGNOSIS — D509 Iron deficiency anemia, unspecified: Secondary | ICD-10-CM | POA: Diagnosis not present

## 2019-08-06 DIAGNOSIS — A0472 Enterocolitis due to Clostridium difficile, not specified as recurrent: Secondary | ICD-10-CM | POA: Diagnosis not present

## 2019-08-06 DIAGNOSIS — R262 Difficulty in walking, not elsewhere classified: Secondary | ICD-10-CM | POA: Diagnosis not present

## 2019-08-06 DIAGNOSIS — K219 Gastro-esophageal reflux disease without esophagitis: Secondary | ICD-10-CM | POA: Diagnosis not present

## 2019-08-06 DIAGNOSIS — F419 Anxiety disorder, unspecified: Secondary | ICD-10-CM | POA: Diagnosis not present

## 2019-08-06 DIAGNOSIS — E7800 Pure hypercholesterolemia, unspecified: Secondary | ICD-10-CM | POA: Diagnosis not present

## 2019-08-06 DIAGNOSIS — I517 Cardiomegaly: Secondary | ICD-10-CM | POA: Diagnosis not present

## 2019-08-06 DIAGNOSIS — G3184 Mild cognitive impairment, so stated: Secondary | ICD-10-CM | POA: Diagnosis not present

## 2019-08-06 DIAGNOSIS — R410 Disorientation, unspecified: Secondary | ICD-10-CM | POA: Diagnosis not present

## 2019-08-06 DIAGNOSIS — A0471 Enterocolitis due to Clostridium difficile, recurrent: Secondary | ICD-10-CM | POA: Diagnosis not present

## 2019-08-06 DIAGNOSIS — E876 Hypokalemia: Secondary | ICD-10-CM | POA: Diagnosis not present

## 2019-08-06 DIAGNOSIS — I4811 Longstanding persistent atrial fibrillation: Secondary | ICD-10-CM | POA: Diagnosis not present

## 2019-08-06 DIAGNOSIS — R0789 Other chest pain: Secondary | ICD-10-CM | POA: Diagnosis not present

## 2019-08-06 DIAGNOSIS — M6281 Muscle weakness (generalized): Secondary | ICD-10-CM | POA: Diagnosis not present

## 2019-08-11 ENCOUNTER — Encounter: Payer: Self-pay | Admitting: Nurse Practitioner

## 2019-08-11 ENCOUNTER — Other Ambulatory Visit: Payer: Self-pay

## 2019-08-11 ENCOUNTER — Other Ambulatory Visit: Payer: Self-pay | Admitting: *Deleted

## 2019-08-11 ENCOUNTER — Ambulatory Visit (INDEPENDENT_AMBULATORY_CARE_PROVIDER_SITE_OTHER): Payer: Medicare Other | Admitting: Nurse Practitioner

## 2019-08-11 VITALS — BP 126/76 | HR 76 | Temp 97.6°F | Ht 64.0 in | Wt 192.0 lb

## 2019-08-11 DIAGNOSIS — K5100 Ulcerative (chronic) pancolitis without complications: Secondary | ICD-10-CM | POA: Diagnosis not present

## 2019-08-11 DIAGNOSIS — A0472 Enterocolitis due to Clostridium difficile, not specified as recurrent: Secondary | ICD-10-CM

## 2019-08-11 DIAGNOSIS — K219 Gastro-esophageal reflux disease without esophagitis: Secondary | ICD-10-CM

## 2019-08-11 NOTE — Patient Outreach (Signed)
Member assessed for potential [facility] Care Management needs as a benefit of  [employer].  Member is currently receiving rehab therapy at [facility].  Member discussed today in weekly telephonic IDT meeting with facility staff, [facility] UM team, and writer.  Facility reports disposition plan is to return home with supportive husband.    Will continue to follow for potential [facility] Care Management needs.   Marthenia Rolling, MSN-Ed, RN,BSN [place] Acute Care Coordinator [phone] [facility]) [phone]  (Toll free office)

## 2019-08-11 NOTE — Patient Instructions (Signed)
Your health issues we discussed today were:   C. difficile colitis, ulcerative colitis, irritable bowel syndrome: 1. I will call the nursing home to discuss the dosing of your medications 2. Recommended they apply "erythema" to help with skin breakdown 3. If your skin breakdown from frequent stools does not get better they can consult the wound care nurse 4. I have asked him to "liberalize your fluids" meaning July you have only drink when needed 5. Have your husband call us if things do not improve  GERD: 1. Continue taking your acid blocker 2. I sent in a prescription for Carafate to your pharmacy to take up to 4 times a day as needed 3. Call us if you have any worsening or severe symptoms  Overall I recommend:  1. Continue your other current medications 2. Return for follow-up in 4 weeks 3. Call us if you have any questions or concerns.   Because of recent events of COVID-19 ("Coronavirus"), follow CDC recommendations:  Wash your hand frequently Avoid touching your face Stay away from people who are sick If you have symptoms such as fever, cough, shortness of breath then call your healthcare provider for further guidance If you are sick, STAY AT HOME unless otherwise directed by your healthcare provider. Follow directions from state and national officials regarding staying safe   At [facility] we value your feedback. You may receive a survey about your visit today. Please share your experience as we strive to create trusting relationships with our patients to provide genuine, compassionate, quality care.  We appreciate your understanding and patience as we review any laboratory studies, imaging, and other diagnostic tests that are ordered as we care for you. Our office policy is 5 business days for review of these results, and any emergent or urgent results are addressed in a timely manner for your best interest. If you do not hear from our office in 1 week, please  contact us.   We also encourage the use of MyChart, which contains your medical information for your review as well. If you are not enrolled in this feature, an access code is on this after visit summary for your convenience. Thank you for allowing [place] to be involved in your care.  It was great to see you today!  I hope you have a Happy Thanksgiving!!

## 2019-08-11 NOTE — Progress Notes (Signed)
Referring Provider: Riley Lam Primary Care Physician:  Jalene Mullet, PA-C Primary GI:  Dr. Gala Romney  Chief Complaint  Patient presents with  . Diarrhea    still unable to get controlled but getting better    HPI:   [name] is a 68 y.o. female who presents for follow-up on diarrhea and colitis.  The patient has not been admitted twice since August for diarrhea and colitis.  Her first admission was lengthy from 05/20/2019 through 06/15/2019.  She presented with C. difficile positive diarrhea, abdominal pain, diffuse pancolitis.  She did not respond to her typical IBD medications and steroid enemas.  She subsequently did improve with IV Solu-Medrol and oral prednisone.  Her C. difficile was treated with Cipro and Flagyl that was switched to oral vancomycin with a 3-week taper.  Flex sig on 06/01/2019 with severe pancolitis and biopsies with severely active chronic colitis with erosions consistent with ulcerative colitis.  Started on Entocort 06/01/2019.  She was very slow to improve as is evident by the length of her admission.  She was readmitted to the hospital from 07/13/2019 through 07/21/2019 for diarrhea of presumed infectious origin, colitis.  She was still on her vancomycin taper and recommended continue.  At discharge it was recommended that she extend her vancomycin taper through 08/19/2019.  Recommended repeat labs 07/26/2019.  Prednisone taper as ordered.  No recent updated labs.  Her last office visit with our practice was 05/11/2019 for ulcerative pancolitis and diarrhea.  Also noted history of chronic IBS.  Some improvement during drug holiday from Metformin.  Overall having some intermittent flare symptoms but CRP and ESR have been normal when not having a flare.  Recommended update inflammatory labs for possible flare signs.  CRP was normal at 6.6 but sedimentation rate was elevated at 31 (essentially upper limit normal).  She has historically been managed on Colazal twice  daily.  Bentyl without much effect, Imodium causes constipation.  Her bout of worsening diarrhea seems to have started when she was on an antibiotic in 2017.  After her last visit we recommended continue Colazal, use over-the-counter antidiarrheal as needed, over-the-counter probiotic, follow-up in 3 months.  Today she states she's doing ok overall. Doesn't remember getting Vancomycin recently, the last time she remembers getting it last but isn't sure exactly when she last got it. Has rectal irritation/pain. Having a bowel movement multiple times a day, not sure exactly how many but feels it's over 5. Difficult to control bowel movements. Has incontinence at least 3 times a day. Stools not watery, more like soft stools. She was supposed to be on a prednisone taper, but it appears she's just getting it daily. Still on Colazal. Hasn't been told any blood in her stools. Has mid-abdominal pain. Is having "terrible" reflux, appears to be on Nexium twice daily. Has nausea but no vomiting. Denies fever, chills, unintentional weight loss. She doesn't feel that she's drinking enough fluids ("they won't let me have extra tea"). Denies URI or flu-like symptoms. Denies loss of sense of taste or smell. Denies chest pain, dyspnea, dizziness, lightheadedness, syncope, near syncope. Denies any other upper or lower GI symptoms.  Past Medical History:  Diagnosis Date  . Anemia   . Anginal pain ([place])    PATIENT STATES DOCT THINKS IS FIBROMYALGIA  . Arthritis   . Asthma   . Atrial fibrillation ([place])   . Blood clot in lung last march  . 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 Chronic back pain   . COPD (chronic obstructive pulmonary disease) ([place])   . DDD (degenerative disc disease)   . Degenerative tear of posterior horn of lateral meniscus of right knee 01/27/2014  . Depression   . Diabetes mellitus without complication ([place])   . Diverticulitis   . Fibromyalgia   . GERD (gastroesophageal reflux disease)   . Headache     HX MIGRAINES    . Hiatal hernia   . HOH (hard of hearing)    right  . Pancreatitis   . Pneumonia    1 YR  . PONV (postoperative nausea and vomiting)    only happened after knee arthroscopy  . Primary localized osteoarthritis of right knee 12/11/2015  . PVC's (premature ventricular contractions)   . Schatzki's ring   . Seizures ([place])    had febrile seizures as child; none since childhood and on no meds  . Shortness of breath dyspnea    WITH EXERTION   . Sleep apnea    CPAP at night  . Ulcerative colitis 2007  . Wears dentures    full top-partial bottom    Past Surgical History:  Procedure Laterality Date  . arch and foot    . BALLOON DILATION  11/17/2018   Procedure: BALLOON DILATION;  Surgeon: Daneil Dolin, MD;  [facility];  Service: Endoscopy;;  esophagus  . BIOPSY  06/01/2019   Procedure: BIOPSY;  Surgeon: Daneil Dolin, MD;  [facility];  Service: Endoscopy;;  . CERVICAL CONE BIOPSY    . CHOLECYSTECTOMY    . COLONOSCOPY  12/03/05   Rourk-marked inflammatory changes of the rectum, left colon, pan colitis, internal hemorrhoids, sigmoid diverticula  . COLONOSCOPY N/A 10/20/2013   Dr. Rourk:Colonic diverticulosis. colonic mucosal ulcerations involving segment of sigmoid colon, atypical for UC. sigmoid colon path with chronic mildly active colitis consistent with IBD, other biopsies including ascending, transverse, descending colon and rectum unremarkable.   . COLONOSCOPY N/A 11/19/2016   Dr. Gala Romney: Diverticulosis, 7 millimeter polyp removed from the descending colon, internal hemorrhoids, segmental biopsies unremarkable.  Marland Kitchen DILATION AND CURETTAGE OF UTERUS    . ESOPHAGEAL DILATION N/A 09/20/2015   Procedure: ESOPHAGEAL DILATION;  Surgeon: Daneil Dolin, MD;  [facility];  Service: Endoscopy;  Laterality: N/A;  . ESOPHAGOGASTRODUODENOSCOPY  01/28/2011   Rourk- Schatzki ring, s/p 67F, otherwise normal/Hiatal hernia otherwise normal stomach  D1 and D2  . ESOPHAGOGASTRODUODENOSCOPY  11/30/08   Rourk-Schatzki's ring status post dilation, hiatal hernia  . ESOPHAGOGASTRODUODENOSCOPY  07/12/2007   Dilation with 56 French, Schatzki's ring  . ESOPHAGOGASTRODUODENOSCOPY N/A 08/10/2014   Dr.Rourk- prominent schatzki's ring s/p maloney dilation and disruption, moderate sized hiatal hernia  . ESOPHAGOGASTRODUODENOSCOPY N/A 09/20/2015   RMR: Schatzki's ring status post dilation as described above. Rather large hiatal hernia; somewhat baggy atonic esophageal body' otherwise normal EGD  . ESOPHAGOGASTRODUODENOSCOPY N/A 11/19/2016   Dr. Gala Romney: moderate Schatzki ring s/p dirsuption with biopsy forceps, large hiatal hernia  . ESOPHAGOGASTRODUODENOSCOPY N/A 11/17/2018   Procedure: ESOPHAGOGASTRODUODENOSCOPY (EGD);  Surgeon: Daneil Dolin, MD;  [facility];  Service: Endoscopy;  Laterality: N/A;  12:00PM  . ESOPHAGOGASTRODUODENOSCOPY (EGD) WITH ESOPHAGEAL DILATION  09/01/2012   Dr. Gala Romney- schatzki's ring, hiatal hernia  . ESOPHAGOGASTRODUODENOSCOPY (EGD) WITH PROPOFOL N/A 02/08/2018   Procedure: ESOPHAGOGASTRODUODENOSCOPY (EGD) WITH PROPOFOL;  Surgeon: Daneil Dolin, MD;  [facility];  Service: Endoscopy;  Laterality: N/A;  10:30am  . FLEXIBLE SIGMOIDOSCOPY N/A 06/01/2019  Procedure: FLEXIBLE SIGMOIDOSCOPY;  Surgeon: Daneil Dolin, MD;  [facility];  Service: Endoscopy;  Laterality: N/A;  . FOOT ARTHRODESIS  2007,2010   both feet  . HERNIA REPAIR     umbilical  . JOINT REPLACEMENT Right   . KNEE ARTHROSCOPY WITH LATERAL MENISECTOMY Right 01/27/2014   Procedure: RIGHT KNEE ARTHROSCOPY WITH PARTIAL LATERAL MENISECTOMY, ;  Surgeon: Johnny Bridge, MD;  Location: [facility];  Service: Orthopedics;  Laterality: Right;  . MALONEY DILATION N/A 08/10/2014   Procedure: [name] DILATION;  Surgeon: Daneil Dolin, MD;  [facility];  Service: Endoscopy;  Laterality: N/A;  [name] DILATION N/A  11/19/2016   Procedure: [name] DILATION;  Surgeon: Daneil Dolin, MD;  [facility];  Service: Endoscopy;  Laterality: N/A;  . [name] DILATION N/A 02/08/2018   Procedure: [name] DILATION;  Surgeon: Daneil Dolin, MD;  [facility];  Service: Endoscopy;  Laterality: N/A;  [name] DILATION N/A 11/17/2018   Procedure: [name] DILATION;  Surgeon: Daneil Dolin, MD;  [facility];  Service: Endoscopy;  Laterality: N/A;  . SAVORY DILATION N/A 08/10/2014   Procedure: SAVORY DILATION;  Surgeon: Daneil Dolin, MD;  [facility];  Service: Endoscopy;  Laterality: N/A;  . SHOULDER OPEN ROTATOR CUFF REPAIR     BILAT  . TONSILLECTOMY    . TOTAL KNEE ARTHROPLASTY Right 12/11/2015   Procedure: TOTAL KNEE ARTHROPLASTY;  Surgeon: Marchia Bond, MD;  Location: [facility];  Service: Orthopedics;  Laterality: Right;    Current Outpatient Medications  Medication Sig Dispense Refill  . aspirin EC 81 MG tablet Take 81 mg by mouth daily.    Marland Kitchen azelastine (ASTELIN) 137 MCG/SPRAY nasal spray Place 1 spray into the nose 2 (two) times daily. Use in each nostril as directed     . balsalazide (COLAZAL) 750 MG capsule Take 3 capsules (2,250 mg total) by mouth 3 (three) times daily. 540 capsule 4  . buPROPion (WELLBUTRIN SR) 150 MG 12 hr tablet Take 150 mg by mouth daily.    . busPIRone (BUSPAR) 15 MG tablet Take 7.5 mg by mouth 2 (two) times daily.    . cetirizine (ZYRTEC) 10 MG tablet Take 10 mg by mouth daily.    . digoxin (LANOXIN) 0.125 MG tablet Take 0.125 mg by mouth daily.    Marland Kitchen diltiazem (DILACOR XR) 180 MG 24 hr capsule Take 180 mg by mouth daily.    Marland Kitchen doxycycline (VIBRAMYCIN) 100 MG capsule Take 100 mg by mouth 2 (two) times daily.    . DULoxetine (CYMBALTA) 30 MG capsule Take 60 mg by mouth daily.     Marland Kitchen ELIQUIS 5 MG TABS tablet Take 1 tablet (5 mg total) by mouth 2 (two) times daily. 180 tablet 3  . esomeprazole (NEXIUM) 40 MG capsule Take 40 mg by mouth 2 (two)  times daily before a meal.    . ferrous sulfate 325 (65 FE) MG tablet Take 325 mg by mouth daily with breakfast.    . Fluticasone-Salmeterol (ADVAIR DISKUS) 500-50 MCG/DOSE AEPB Inhale one puff twice daily 180 each 0  . insulin aspart (NOVOLOG) 100 UNIT/ML injection insulin aspart (novoLOG) injection 0-9 Units 0-9 Units, Subcutaneous, 3 times daily with meals, Correction coverage: Sensitive CBG < 70: Implement Hypoglycemia Standing Orders and refer to Hypoglycemia Standing Orders sidebar report CBG 70 - 120: 0 units CBG 121 - 150: 1 unit CBG 151 - 200: 2 units CBG  201 - 250: 3 units CBG 251 - 300: 5 units CBG 301 - 350: 7 units CBG 351 - 400: 9 units CBG > 400 10 mL 3  . insulin NPH-regular Human (70-30) 100 UNIT/ML injection Inject 20 Units into the skin daily with breakfast.    . ketotifen (ALAWAY) 0.025 % ophthalmic solution Place 1 drop into both eyes daily.     . Methylcellulose, Laxative, 500 MG TABS Take by mouth 2 (two) times daily.    . metoprolol tartrate (LOPRESSOR) 25 MG tablet Take 1 tablet (25 mg total) by mouth 2 (two) times daily. (Patient taking differently: Take 12.5 mg by mouth 3 (three) times daily. ) 60 tablet 2  . pravastatin (PRAVACHOL) 20 MG tablet TAKE 1 TABLET DAILY 90 tablet 1  . predniSONE (DELTASONE) 10 MG tablet Take 1 tablet (10 mg total) by mouth See admin instructions. Take 20 mg daily for 5 days then 10 mg daily for 5 days and then stop-always take with food (Patient taking differently: Take 10 mg by mouth daily with breakfast. ) 20 tablet 0  . pregabalin (LYRICA) 100 MG capsule Take 100 mg by mouth 2 (two) times daily.    . traZODone (DESYREL) 100 MG tablet Take 100 mg by mouth at bedtime.      . vancomycin (VANCOCIN) 50 mg/mL oral solution oral vancomycin taper (125 mg 4 times daily from 07/13/2019 through 07/27/2019, then 125 mg twice daily from 07/27/2019 through 08/04/2019, then 125 mg daily starting 08/04/2019 through 08/11/2019, 125 mg every other day starting  08/11/2019 through 08/19/2019, then 125 mg 25 mg every 3 days for 1 week starting 08/19/2019) 150 mL 0   No current facility-administered medications for this visit.     Allergies as of 08/11/2019 - Review Complete 08/11/2019  Allergen Reaction Noted  . Mesalamine Other (See Comments)   . Cefprozil Other (See Comments)   . Cefuroxime axetil Other (See Comments) 08/08/2008  . Morphine and related Hives and Itching 08/23/2012  . Penicillins Hives 08/08/2008  . Eggs or egg-derived products  05/30/2019  . Turkey-sweet potatoes-peaches [alitraq]  05/30/2019  . Aspirin Other (See Comments)   . Chicken allergy Nausea And Vomiting and Rash 11/27/2015  . Clarithromycin Nausea And Vomiting 08/08/2008  . Codeine Other (See Comments)   . Entex Other (See Comments) 08/08/2008    Family History  Problem Relation Age of Onset  . Heart disease Mother   . Colitis Mother   . Atrial fibrillation Sister   . Arrhythmia Brother   . Colon cancer Neg Hx     Social History   Socioeconomic History  . Marital status: Married    Spouse name: Not on file  . Number of children: 3  . Years of education: Not on file  . Highest education level: Not on file  Occupational History  . 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 Financial resource strain: Not hard at all  . Food insecurity    Worry: Never true    Inability: Never true  . Transportation needs    Medical: No    Non-medical: No  Tobacco Use  . Smoking status: Former Smoker    Packs/day: 1.00    Years: 30.00    Pack years: 30.00    Types: Cigarettes    Start date: 07/07/1969    Quit date: 08/30/2011    Years since quitting: 7.9  . Smokeless tobacco: Never Used  Substance and Sexual Activity  . Alcohol use: No  Alcohol/week: 0.0 standard drinks  . Drug use: No  . Sexual activity: Yes    Partners: Male    Birth control/protection: None    Comment: spouse  Lifestyle  . Physical activity    Days per week: 0 days     Minutes per session: 0 min  . Stress: To some extent  Relationships  . Social Herbalist on phone: Once a week    Gets together: Once a week    Attends religious service: Never    Active member of club or organization: No    Attends meetings of clubs or organizations: Never    Relationship status: Married  Other Topics Concern  . Not on file  Social History Narrative  . Not on file    Review of Systems: General: Negative for anorexia, weight loss, fever, chills, fatigue, weakness. ENT: Negative for hoarseness, difficulty swallowing. CV: Negative for chest pain, angina, palpitations, peripheral edema.  Respiratory: Negative for dyspnea at rest, cough, sputum, wheezing.  GI: See history of present illness. States rectal area very sore from frequent stools. Endo: Negative for unusual weight change.  Heme: Negative for bruising or bleeding. Allergy: Negative for rash or hives.   Physical Exam: BP 126/76   Pulse 76   Temp 97.6 F (36.4 C) (Oral)   Ht _0  (1.626 m)   Wt 192 lb (87.1 kg)   BMI 32.96 kg/m  General:   Alert and oriented. Pleasant and cooperative. Well-nourished and well-developed. Appears tired/ill  Head:  Normocephalic and atraumatic. Eyes:  Without icterus, sclera clear and conjunctiva pink.  Ears:  Normal auditory acuity. Cardiovascular:  S1, S2 present without murmurs appreciated. Extremities without clubbing or edema. Respiratory:  Clear to auscultation bilaterally. No wheezes, rales, or rhonchi. No distress.  Gastrointestinal:  +BS, soft, non-tender and non-distended. No HSM noted. No guarding or rebound. No masses appreciated.  Rectal:  Deferred  Musculoskalatal:  Symmetrical without gross deformities. Neurologic:  Alert and oriented x4;  grossly normal neurologically. Psych:  Alert and cooperative. Normal mood and affect. Heme/Lymph/Immune: No excessive bruising noted.    08/11/2019 11:51 AM   Disclaimer: This note was dictated with  voice recognition software. Similar sounding words can inadvertently be transcribed and may not be corrected upon review.

## 2019-08-12 NOTE — Assessment & Plan Note (Signed)
The patient has had quite a prolonged and a very complicated case of C. difficile colitis.  She was initially admitted for almost 1 month.  Recurrence admission for about a week and a half.  Her disease is complicated due to prolonged C. difficile in addition to documented flare of ulcerative colitis as well as irritable bowel syndrome.  She is still having frequent stools.  It seems like her stools may be solidifying/soft.  However, when I reviewed her medication record it seems that her vancomycin orders are not correct.  It appears 3 days worth of twice daily dosing or not documented is given.  It once a day dosing for 1 week has been completely admitted.  I will make every attempt to contact the nursing home to correct her dosage to make sure she gets the prolonged taper she will likely need to recover.  If she does have progressive symptoms, given vancomycin initial dosage, vancomycin taper, she may be best served by evaluation at [facility] for consideration of fecal transplant.  I have encouraged her to drink adequate fluids and she states that she is not allowed to have expertise with her meals.  I have written an order to liberalize fluids, push fluids.  Significant soreness of her bottom which she states is exquisitely tender.  I recommended the nursing home to apply a barrier cream to help.  I have also recommended consultation with wound care nurse as needed if she does not improve.  Follow-up in 4 weeks.

## 2019-08-12 NOTE — Assessment & Plan Note (Signed)
Worsening GERD symptoms despite Nexium at the nursing home.  I recommended addition of Carafate 4 times a day as needed.  Follow-up in 4 weeks.  Call for worsening symptoms.

## 2019-08-12 NOTE — Progress Notes (Signed)
I have made multiple attempts to contact the nursing staff at [facility] to discuss errors in Vancomycin orders, as well as recommendations for barrier cream/wound care consult prn, librilization of fluids. I have been unsucessfull either getting prolonged ringing with no answer or being disconnected after several rings with no answer.  Vanc orders SHOULD have been (per hosp d/c order):  oral vancomycin taper (125 mg 4 times daily from 07/13/2019 through 07/27/2019, then 125 mg twice daily from 07/27/2019 through 08/04/2019, then 125 mg daily starting 08/04/2019 through 08/11/2019, 125 mg every other day starting 08/11/2019 through 08/19/2019, then 125 mg 25 mg every 3 days for 1 week starting 08/19/2019)   Will continue to try and contact nursing staff at nursing/rehab facility.

## 2019-08-12 NOTE — Assessment & Plan Note (Signed)
The patient has a chronic history of ulcerative colitis which, in addition to IBS, complicated her admission for diarrhea.  She remains on Colazal which is historically controlled her symptoms well.  She is recently finished her prednisone taper.  Continue her current medications for now.  If she has worsening symptoms we can always check inflammatory markers to try to discern if her symptom changes due to C. difficile recurrence, C. difficile prolonged course, versus IBD versus IBS or combination of these.  Follow-up in 4 weeks.

## 2019-08-17 ENCOUNTER — Telehealth: Payer: Self-pay | Admitting: *Deleted

## 2019-08-17 ENCOUNTER — Telehealth: Payer: Self-pay | Admitting: Nurse Practitioner

## 2019-08-17 NOTE — Telephone Encounter (Signed)
Siri Cole called from the [facility] and said that they put the order back in for Vancomycin on pt.  [phone].

## 2019-08-17 NOTE — Telephone Encounter (Signed)
I have tried to call the nurse at [facility] assigned to the patient. At least 5 attempts made. A few times the front desk has answered and transferred to the nurse's phone. At that point either it rings and rings and the line is abruptly disconnected, or I sit on hold for an extensive time with no answer (just now waited 15 mins on hold).  I called and spoke with the patient's husband and he has had a similar experience. He took our contact number and will try to get in touch with them to call us.  Issue to discuss: Vancomycin for recurrent CDiff her bid dosing is missing 3 days (11/3-11/5) with no notation of given or not given. The 1 week of once daily dosing is also missing and not given. Went from bid dosing to every other day (3 times a week.)

## 2019-08-18 ENCOUNTER — Encounter: Payer: Self-pay | Admitting: Cardiology

## 2019-08-18 ENCOUNTER — Other Ambulatory Visit: Payer: Self-pay | Admitting: *Deleted

## 2019-08-18 ENCOUNTER — Telehealth (INDEPENDENT_AMBULATORY_CARE_PROVIDER_SITE_OTHER): Payer: Medicare Other | Admitting: Cardiology

## 2019-08-18 VITALS — BP 136/74 | HR 76 | Temp 97.9°F | Resp 18 | Ht 64.0 in | Wt 195.0 lb

## 2019-08-18 DIAGNOSIS — J449 Chronic obstructive pulmonary disease, unspecified: Secondary | ICD-10-CM | POA: Diagnosis not present

## 2019-08-18 DIAGNOSIS — E782 Mixed hyperlipidemia: Secondary | ICD-10-CM

## 2019-08-18 DIAGNOSIS — R0789 Other chest pain: Secondary | ICD-10-CM

## 2019-08-18 DIAGNOSIS — I4811 Longstanding persistent atrial fibrillation: Secondary | ICD-10-CM | POA: Diagnosis not present

## 2019-08-18 DIAGNOSIS — I4891 Unspecified atrial fibrillation: Secondary | ICD-10-CM

## 2019-08-18 DIAGNOSIS — E1165 Type 2 diabetes mellitus with hyperglycemia: Secondary | ICD-10-CM | POA: Diagnosis not present

## 2019-08-18 DIAGNOSIS — F330 Major depressive disorder, recurrent, mild: Secondary | ICD-10-CM | POA: Diagnosis not present

## 2019-08-18 NOTE — Progress Notes (Signed)
Virtual Visit via Telephone Note   This visit type was conducted due to national recommendations for restrictions regarding the COVID-19 Pandemic (e.g. social distancing) in an effort to limit this patient's exposure and mitigate transmission in our community.  Due to her co-morbid illnesses, this patient is at least at moderate risk for complications without adequate follow up.  This format is felt to be most appropriate for this patient at this time.  The patient did not have access to video technology/had technical difficulties with video requiring transitioning to audio format only (telephone).  All issues noted in this document were discussed and addressed.  No physical exam could be performed with this format.  Please refer to the patient's chart for her  consent to telehealth for [facility].   Date:  08/18/2019   ID:  [name], DOB 1951/06/11, MRN [id number]  Patient Location: Home Provider Location: Office  PCP:  Jalene Mullet, PA-C  Cardiologist:  Carlyle Dolly, MD  Electrophysiologist:  None   Evaluation Performed:  Follow-Up Visit  Chief Complaint:  Follow up  History of Present Illness:    [name] is a 68 y.o. female seen today for follow up of the following medical problems.   1. Chest pain - history of atypical chest pain.  - 06/2014 Lexiscan MPI without ischemia - she has had prior EGDs with dilatation as well that has previously improved her symptoms. .  -05/2017 echo with normal LVEF   - denies any recent symptoms    2. Hyperlipidemia - tolerating pravastatin well, previous muscle aches on lipitor - compliant with statin  3. Ulcerating colitis - followed by GI   4. Dysphagia - followed by GI, recent EGD  6. Afib  - admission 06/2019 with infectious diarrhea/cdiff, had some issues with afib with RVR that admission - lopressor adjusted to 56m bid  No recent palpitations - no bleeding on eliquis.   The patient does  not have symptoms concerning for COVID-19 infection (fever, chills, cough, or new shortness of breath).    Past Medical History:  Diagnosis Date  . Anemia   . Anginal pain ([place]    PATIENT STATES DOCT THINKS IS FIBROMYALGIA  . Arthritis   . Asthma   . Atrial fibrillation ([place]   . Blood clot in lung last march  . Cancer ([place]    1980  CERVICAL  . Chronic back pain   . COPD (chronic obstructive pulmonary disease) ([place]   . DDD (degenerative disc disease)   . Degenerative tear of posterior horn of lateral meniscus of right knee 01/27/2014  . Depression   . Diabetes mellitus without complication ([place]   . Diverticulitis   . Fibromyalgia   . GERD (gastroesophageal reflux disease)   . Headache    HX MIGRAINES    . Hiatal hernia   . HOH (hard of hearing)    right  . Pancreatitis   . Pneumonia    1 YR  . PONV (postoperative nausea and vomiting)    only happened after knee arthroscopy  . Primary localized osteoarthritis of right knee 12/11/2015  . PVC's (premature ventricular contractions)   . Schatzki's ring   . Seizures ([place]    had febrile seizures as child; none since childhood and on no meds  . Shortness of breath dyspnea    WITH EXERTION   . Sleep apnea    CPAP at night  . Ulcerative colitis 2007  . Wears dentures    full  top-partial bottom   Past Surgical History:  Procedure Laterality Date  . arch and foot    . BALLOON DILATION  11/17/2018   Procedure: BALLOON DILATION;  Surgeon: Daneil Dolin, MD;  [facility];  Service: Endoscopy;;  esophagus  . BIOPSY  06/01/2019   Procedure: BIOPSY;  Surgeon: Daneil Dolin, MD;  [facility];  Service: Endoscopy;;  . CERVICAL CONE BIOPSY    . CHOLECYSTECTOMY    . COLONOSCOPY  12/03/05   Rourk-marked inflammatory changes of the rectum, left colon, pan colitis, internal hemorrhoids, sigmoid diverticula  . COLONOSCOPY N/A 10/20/2013   Dr. Rourk:Colonic diverticulosis. colonic mucosal ulcerations involving  segment of sigmoid colon, atypical for UC. sigmoid colon path with chronic mildly active colitis consistent with IBD, other biopsies including ascending, transverse, descending colon and rectum unremarkable.   . COLONOSCOPY N/A 11/19/2016   Dr. Gala Romney: Diverticulosis, 7 millimeter polyp removed from the descending colon, internal hemorrhoids, segmental biopsies unremarkable.  Marland Kitchen DILATION AND CURETTAGE OF UTERUS    . ESOPHAGEAL DILATION N/A 09/20/2015   Procedure: ESOPHAGEAL DILATION;  Surgeon: Daneil Dolin, MD;  [facility];  Service: Endoscopy;  Laterality: N/A;  . ESOPHAGOGASTRODUODENOSCOPY  01/28/2011   Rourk- Schatzki ring, s/p 35F, otherwise normal/Hiatal hernia otherwise normal stomach D1 and D2  . ESOPHAGOGASTRODUODENOSCOPY  11/30/08   Rourk-Schatzki's ring status post dilation, hiatal hernia  . ESOPHAGOGASTRODUODENOSCOPY  07/12/2007   Dilation with 56 French, Schatzki's ring  . ESOPHAGOGASTRODUODENOSCOPY N/A 08/10/2014   Dr.Rourk- prominent schatzki's ring s/p maloney dilation and disruption, moderate sized hiatal hernia  . ESOPHAGOGASTRODUODENOSCOPY N/A 09/20/2015   RMR: Schatzki's ring status post dilation as described above. Rather large hiatal hernia; somewhat baggy atonic esophageal body' otherwise normal EGD  . ESOPHAGOGASTRODUODENOSCOPY N/A 11/19/2016   Dr. Gala Romney: moderate Schatzki ring s/p dirsuption with biopsy forceps, large hiatal hernia  . ESOPHAGOGASTRODUODENOSCOPY N/A 11/17/2018   Procedure: ESOPHAGOGASTRODUODENOSCOPY (EGD);  Surgeon: Daneil Dolin, MD;  [facility];  Service: Endoscopy;  Laterality: N/A;  12:00PM  . ESOPHAGOGASTRODUODENOSCOPY (EGD) WITH ESOPHAGEAL DILATION  09/01/2012   Dr. Gala Romney- schatzki's ring, hiatal hernia  . ESOPHAGOGASTRODUODENOSCOPY (EGD) WITH PROPOFOL N/A 02/08/2018   Procedure: ESOPHAGOGASTRODUODENOSCOPY (EGD) WITH PROPOFOL;  Surgeon: Daneil Dolin, MD;  [facility];  Service: Endoscopy;  Laterality: N/A;   10:30am  . FLEXIBLE SIGMOIDOSCOPY N/A 06/01/2019   Procedure: FLEXIBLE SIGMOIDOSCOPY;  Surgeon: Daneil Dolin, MD;  [facility];  Service: Endoscopy;  Laterality: N/A;  . FOOT ARTHRODESIS  2007,2010   both feet  . HERNIA REPAIR     umbilical  . JOINT REPLACEMENT Right   . KNEE ARTHROSCOPY WITH LATERAL MENISECTOMY Right 01/27/2014   Procedure: RIGHT KNEE ARTHROSCOPY WITH PARTIAL LATERAL MENISECTOMY, ;  Surgeon: Johnny Bridge, MD;  Location: [place];  Service: Orthopedics;  Laterality: Right;  . MALONEY DILATION N/A 08/10/2014   Procedure: [name] DILATION;  Surgeon: Daneil Dolin, MD;  [facility];  Service: Endoscopy;  Laterality: N/A;  [name] DILATION N/A 11/19/2016   Procedure: [name] DILATION;  Surgeon: Daneil Dolin, MD;  [facility];  Service: Endoscopy;  Laterality: N/A;  . [name] DILATION N/A 02/08/2018   Procedure: [name] DILATION;  Surgeon: Daneil Dolin, MD;  [facility];  Service: Endoscopy;  Laterality: N/A;  [name] DILATION N/A 11/17/2018   Procedure: [name] DILATION;  Surgeon: Daneil Dolin, MD;  [facility];  Service: Endoscopy;  Laterality: N/A;  . SAVORY DILATION N/A 08/10/2014   Procedure: SAVORY DILATION;  Surgeon: Daneil Dolin, MD;  [facility];  Service: Endoscopy;  Laterality: N/A;  . SHOULDER OPEN ROTATOR CUFF REPAIR     BILAT  . TONSILLECTOMY    . TOTAL KNEE ARTHROPLASTY Right 12/11/2015   Procedure: TOTAL KNEE ARTHROPLASTY;  Surgeon: Marchia Bond, MD;  Location: [place];  Service: Orthopedics;  Laterality: Right;     No outpatient medications have been marked as taking for the 08/18/19 encounter (Appointment) with Arnoldo Lenis, MD.     Allergies:   Mesalamine, Cefprozil, Cefuroxime axetil, Morphine and related, Penicillins, Eggs or egg-derived products, Turkey-sweet potatoes-peaches [alitraq], Aspirin, Chicken allergy, Clarithromycin, Codeine, and Entex    Social History   Tobacco Use  . Smoking status: Former Smoker    Packs/day: 1.00    Years: 30.00    Pack years: 30.00    Types: Cigarettes    Start date: 07/07/1969    Quit date: 08/30/2011    Years since quitting: 7.9  . Smokeless tobacco: Never Used  Substance Use Topics  . Alcohol use: No    Alcohol/week: 0.0 standard drinks  . Drug use: No     Family Hx: The patient's family history includes Arrhythmia in her brother; Atrial fibrillation in her sister; Colitis in her mother; Heart disease in her mother. There is no history of Colon cancer.  ROS:   Please see the history of present illness.     All other systems reviewed and are negative.   Prior CV studies:   The following studies were reviewed today:  06/2014 Lexiscan MPI IMPRESSION: 1. No reversible ischemia or infarction.  2. Normal left ventricular wall motion.  3. Left ventricular ejection fraction 60%  4. Low-risk stress test findings*.   07/2014 Echo Study Conclusions  - Left ventricle: The cavity size was normal. Wall thickness was normal. Systolic function was normal. The estimated ejection fraction was in the range of 60% to 65%. - Aortic valve: Valve area (VTI): 3.31 cm^2. Valve area (Vmax): 3.27 cm^2. Valve area (Vmean): 2.77 cm^2. - Left atrium: The atrium was mildly dilated. - Technically difficult study.  [facility] 79-89%, normal diastolic function,     Labs/Other Tests and Data Reviewed:    EKG:  No ECG reviewed.  Recent Labs: 06/14/2019: Magnesium 2.0 07/21/2019: ALT 21; BUN 13; Creatinine, Ser 0.76; Hemoglobin 11.5; Platelets 221; Potassium 3.3; Sodium 139   Recent Lipid Panel Lab Results  Component Value Date/Time   CHOL 191 02/07/2012 01:01 PM   HDL 41 02/07/2012 01:01 PM    Wt Readings from Last 3 Encounters:  08/11/19 192 lb (87.1 kg)  07/15/19 179 lb 7.3 oz (81.4 kg)  06/15/19 197 lb 5 oz (89.5 kg)     Objective:    Vital Signs:    Today's Vitals   08/18/19 0951  BP: 136/74  Pulse: 76  Resp: 18  Temp: 97.9 F (36.6 C)  SpO2: 93%  Weight: 195 lb (88.5 kg)  Height: 5' 4"  (1.626 m)   Body mass index is 33.47 kg/m. Normal affect. Normal speech pattern and tone. Comfortable, no apparent distress. NO audible signs of SOB or wheezing.   ASSESSMENT & PLAN:    1. Chest pain - no recent symptoms, continue to monitor.   2. Hyperlipidemia - Previous side effects on lipitor, tolerating pravastatin well. - continue current statin  3.Afib - no recent symptoms, continue current  meds. Should just be on eliquis, will verify ASA is off her list and she is not taking.    F/u 6 months  COVID-19 Education: The signs and symptoms of COVID-19 were discussed with the patient and how to seek care for testing (follow up with PCP or arrange E-visit).  The importance of social distancing was discussed today.  Time:   Today, I have spent 14 minutes with the patient with telehealth technology discussing the above problems.     Medication Adjustments/Labs and Tests Ordered: Current medicines are reviewed at length with the patient today.  Concerns regarding medicines are outlined above.   Tests Ordered: No orders of the defined types were placed in this encounter.   Medication Changes: No orders of the defined types were placed in this encounter.   Follow Up:  In Person in 6 month(s)  Signed, Carlyle Dolly, MD  08/18/2019 8:18 AM    [place]

## 2019-08-18 NOTE — Patient Instructions (Addendum)
Medication Instructions:  Your physician recommends that you continue on your current medications as directed. Please refer to the Current Medication list given to you today.  No changes in medications   Labwork: none  Testing/Procedures: none  Follow-Up: Your physician wants you to follow-up in: 6 months.  You will receive a reminder letter in the mail two months in advance. If you don't receive a letter, please call our office to schedule the follow-up appointment.   Any Other Special Instructions Will Be Listed Below (If Applicable).     If you need a refill on your cardiac medications before your next appointment, please call your pharmacy.

## 2019-08-18 NOTE — Addendum Note (Signed)
Addended by: Debbora Lacrosse R on: 08/18/2019 10:17 AM   Modules accepted: Orders

## 2019-08-18 NOTE — Telephone Encounter (Signed)
Noted, thanks!

## 2019-08-18 NOTE — Patient Outreach (Signed)
Member assessed for potential [facility] Care Management needs as a benefit of  [place] [employer].  Member is currently receiving skilled therapy at [facility].  Member discussed in weekly telephonic IDT meeting with facility staff, [facility] UM team, and writer.  Facility reports member is not progressing well with therapy. Facility dc planner to follow up with husband to discuss further.  Will continue to follow for disposition plans, progression, and for potential [facility] Care Management needs.  Marthenia Rolling, MSN-Ed, RN,BSN [place] Acute Care Coordinator [phone] [facility]) [phone]  (Toll free office)

## 2019-08-22 ENCOUNTER — Telehealth: Payer: Self-pay | Admitting: Internal Medicine

## 2019-08-22 NOTE — Telephone Encounter (Signed)
Pt's husband called asking to speak to EG. Please call [phone]

## 2019-08-22 NOTE — Telephone Encounter (Signed)
Noted  

## 2019-08-22 NOTE — Telephone Encounter (Signed)
I'm so sorry to hear. Hopefully PCP can help with this. Let me know if anything is needed from [place] to help facilitate this.

## 2019-08-22 NOTE — Telephone Encounter (Signed)
Spoke with spouse. He said he received a call from the [facility] this morning stating pt fell. Spouse says he wants her moved to a new nursing facility. Pt's spouse was advised to call pt's PCP and discuss the situation.

## 2019-08-27 DIAGNOSIS — R410 Disorientation, unspecified: Secondary | ICD-10-CM | POA: Diagnosis not present

## 2019-08-29 DIAGNOSIS — E782 Mixed hyperlipidemia: Secondary | ICD-10-CM | POA: Diagnosis not present

## 2019-08-29 DIAGNOSIS — E1165 Type 2 diabetes mellitus with hyperglycemia: Secondary | ICD-10-CM | POA: Diagnosis not present

## 2019-08-29 DIAGNOSIS — E7800 Pure hypercholesterolemia, unspecified: Secondary | ICD-10-CM | POA: Diagnosis not present

## 2019-09-14 ENCOUNTER — Ambulatory Visit: Payer: Medicare Other | Admitting: Nurse Practitioner

## 2019-09-30 DEATH — deceased
